# Patient Record
Sex: Female | Born: 1950 | Race: Black or African American | Hispanic: No | Smoking: Never smoker
Health system: Southern US, Community
[De-identification: ages and names within clinical notes are randomized; demographics above are authoritative.]

## PROBLEM LIST (undated history)

## (undated) DIAGNOSIS — N870 Mild cervical dysplasia: Secondary | ICD-10-CM

## (undated) DIAGNOSIS — M8580 Other specified disorders of bone density and structure, unspecified site: Secondary | ICD-10-CM

## (undated) DIAGNOSIS — I471 Supraventricular tachycardia, unspecified: Secondary | ICD-10-CM

## (undated) DIAGNOSIS — M1990 Unspecified osteoarthritis, unspecified site: Secondary | ICD-10-CM

## (undated) DIAGNOSIS — Z973 Presence of spectacles and contact lenses: Secondary | ICD-10-CM

## (undated) DIAGNOSIS — E785 Hyperlipidemia, unspecified: Secondary | ICD-10-CM

## (undated) DIAGNOSIS — K219 Gastro-esophageal reflux disease without esophagitis: Secondary | ICD-10-CM

## (undated) DIAGNOSIS — E063 Autoimmune thyroiditis: Secondary | ICD-10-CM

## (undated) DIAGNOSIS — Z86010 Personal history of colonic polyps: Secondary | ICD-10-CM

## (undated) DIAGNOSIS — Z8669 Personal history of other diseases of the nervous system and sense organs: Secondary | ICD-10-CM

## (undated) DIAGNOSIS — E039 Hypothyroidism, unspecified: Secondary | ICD-10-CM

## (undated) DIAGNOSIS — T8859XA Other complications of anesthesia, initial encounter: Secondary | ICD-10-CM

## (undated) DIAGNOSIS — T4145XA Adverse effect of unspecified anesthetic, initial encounter: Secondary | ICD-10-CM

## (undated) HISTORY — DX: Hypothyroidism, unspecified: E03.9

## (undated) HISTORY — DX: Other specified disorders of bone density and structure, unspecified site: M85.80

## (undated) HISTORY — DX: Mild cervical dysplasia: N87.0

## (undated) HISTORY — PX: COLONOSCOPY: SHX174

## (undated) HISTORY — DX: Supraventricular tachycardia: I47.1

## (undated) HISTORY — DX: Hyperlipidemia, unspecified: E78.5

## (undated) HISTORY — DX: Supraventricular tachycardia, unspecified: I47.10

---
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
## 1974-04-22 HISTORY — PX: BREAST SURGERY: SHX581

## 1999-04-23 HISTORY — PX: CARDIAC ELECTROPHYSIOLOGY STUDY AND ABLATION: SHX1294

## 2007-04-04 ENCOUNTER — Emergency Department (HOSPITAL_COMMUNITY): Admission: EM | Admit: 2007-04-04 | Discharge: 2007-04-04 | Payer: Self-pay | Admitting: *Deleted

## 2007-12-07 DIAGNOSIS — I498 Other specified cardiac arrhythmias: Secondary | ICD-10-CM | POA: Insufficient documentation

## 2008-02-01 DIAGNOSIS — E785 Hyperlipidemia, unspecified: Secondary | ICD-10-CM | POA: Insufficient documentation

## 2008-04-22 DIAGNOSIS — Z860100 Personal history of colon polyps, unspecified: Secondary | ICD-10-CM

## 2008-04-22 HISTORY — DX: Personal history of colon polyps, unspecified: Z86.0100

## 2008-04-22 HISTORY — DX: Personal history of colonic polyps: Z86.010

## 2008-09-26 ENCOUNTER — Emergency Department (HOSPITAL_COMMUNITY): Admission: EM | Admit: 2008-09-26 | Discharge: 2008-09-26 | Payer: Self-pay | Admitting: Emergency Medicine

## 2008-11-16 ENCOUNTER — Encounter: Admission: RE | Admit: 2008-11-16 | Discharge: 2008-11-30 | Payer: Self-pay | Admitting: Sports Medicine

## 2008-11-20 ENCOUNTER — Emergency Department (HOSPITAL_COMMUNITY): Admission: EM | Admit: 2008-11-20 | Discharge: 2008-11-20 | Payer: Self-pay | Admitting: Family Medicine

## 2009-01-07 ENCOUNTER — Ambulatory Visit: Payer: Self-pay | Admitting: Diagnostic Radiology

## 2009-01-07 ENCOUNTER — Encounter: Payer: Self-pay | Admitting: Emergency Medicine

## 2009-01-07 ENCOUNTER — Inpatient Hospital Stay (HOSPITAL_COMMUNITY): Admission: EM | Admit: 2009-01-07 | Discharge: 2009-01-08 | Payer: Self-pay | Admitting: Internal Medicine

## 2009-02-19 ENCOUNTER — Encounter: Admission: RE | Admit: 2009-02-19 | Discharge: 2009-02-19 | Payer: Self-pay | Admitting: Sports Medicine

## 2009-03-10 ENCOUNTER — Encounter: Admission: RE | Admit: 2009-03-10 | Discharge: 2009-03-10 | Payer: Self-pay | Admitting: Sports Medicine

## 2009-03-17 ENCOUNTER — Encounter: Admission: RE | Admit: 2009-03-17 | Discharge: 2009-03-17 | Payer: Self-pay | Admitting: Sports Medicine

## 2009-08-15 ENCOUNTER — Encounter: Admission: RE | Admit: 2009-08-15 | Discharge: 2009-08-15 | Payer: Self-pay | Admitting: Sports Medicine

## 2009-10-02 ENCOUNTER — Encounter: Admission: RE | Admit: 2009-10-02 | Discharge: 2009-10-02 | Payer: Self-pay | Admitting: Family Medicine

## 2009-11-01 ENCOUNTER — Other Ambulatory Visit: Admission: RE | Admit: 2009-11-01 | Discharge: 2009-11-01 | Payer: Self-pay | Admitting: Gynecology

## 2009-11-01 ENCOUNTER — Ambulatory Visit: Payer: Self-pay | Admitting: Women's Health

## 2010-01-17 DIAGNOSIS — E039 Hypothyroidism, unspecified: Secondary | ICD-10-CM | POA: Insufficient documentation

## 2010-02-28 ENCOUNTER — Ambulatory Visit: Payer: Self-pay | Admitting: Women's Health

## 2010-03-10 ENCOUNTER — Emergency Department (HOSPITAL_COMMUNITY): Admission: EM | Admit: 2010-03-10 | Discharge: 2010-03-10 | Payer: Self-pay | Admitting: Emergency Medicine

## 2010-03-19 ENCOUNTER — Ambulatory Visit: Payer: Self-pay | Admitting: Women's Health

## 2010-04-30 ENCOUNTER — Ambulatory Visit
Admission: RE | Admit: 2010-04-30 | Discharge: 2010-04-30 | Payer: Self-pay | Source: Home / Self Care | Attending: Women's Health | Admitting: Women's Health

## 2010-05-13 ENCOUNTER — Encounter: Payer: Self-pay | Admitting: Sports Medicine

## 2010-05-18 ENCOUNTER — Ambulatory Visit
Admission: RE | Admit: 2010-05-18 | Discharge: 2010-05-18 | Payer: Self-pay | Source: Home / Self Care | Attending: Women's Health | Admitting: Women's Health

## 2010-07-03 LAB — CBC
HCT: 36.4 % (ref 36.0–46.0)
Hemoglobin: 13 g/dL (ref 12.0–15.0)
MCH: 29.3 pg (ref 26.0–34.0)
MCHC: 35.7 g/dL (ref 30.0–36.0)
MCV: 82 fL (ref 78.0–100.0)
Platelets: 133 10*3/uL — ABNORMAL LOW (ref 150–400)
RBC: 4.44 MIL/uL (ref 3.87–5.11)
RDW: 14.4 % (ref 11.5–15.5)
WBC: 5 10*3/uL (ref 4.0–10.5)

## 2010-07-03 LAB — DIFFERENTIAL
Basophils Absolute: 0 10*3/uL (ref 0.0–0.1)
Basophils Relative: 0 % (ref 0–1)
Eosinophils Absolute: 0.1 10*3/uL (ref 0.0–0.7)
Eosinophils Relative: 1 % (ref 0–5)
Lymphocytes Relative: 36 % (ref 12–46)
Lymphs Abs: 1.8 10*3/uL (ref 0.7–4.0)
Monocytes Absolute: 0.3 10*3/uL (ref 0.1–1.0)
Monocytes Relative: 7 % (ref 3–12)
Neutro Abs: 2.8 10*3/uL (ref 1.7–7.7)
Neutrophils Relative %: 57 % (ref 43–77)

## 2010-07-03 LAB — BASIC METABOLIC PANEL
BUN: 15 mg/dL (ref 6–23)
CO2: 27 mEq/L (ref 19–32)
Calcium: 9.8 mg/dL (ref 8.4–10.5)
Chloride: 108 mEq/L (ref 96–112)
Creatinine, Ser: 0.74 mg/dL (ref 0.4–1.2)
GFR calc Af Amer: >60 mL/min (ref >60)
GFR calc non Af Amer: >60 mL/min (ref >60)
Glucose, Bld: 92 mg/dL (ref 70–99)
Potassium: 3.6 mEq/L (ref 3.5–5.1)
Sodium: 140 mEq/L (ref 135–145)

## 2010-07-03 LAB — D-DIMER, QUANTITATIVE: D-Dimer, Quant: 0.61 ug/mL-FEU — ABNORMAL HIGH (ref 0.00–0.48)

## 2010-07-27 LAB — CBC
HCT: 39.2 % (ref 36.0–46.0)
Hemoglobin: 13.7 g/dL (ref 12.0–15.0)
MCHC: 35 g/dL (ref 30.0–36.0)
MCV: 87.5 fL (ref 78.0–100.0)
Platelets: 127 K/uL — ABNORMAL LOW (ref 150–400)
RBC: 4.49 MIL/uL (ref 3.87–5.11)
RDW: 14.3 % (ref 11.5–15.5)
WBC: 5 K/uL (ref 4.0–10.5)

## 2010-07-27 LAB — COMPREHENSIVE METABOLIC PANEL
ALT: 24 U/L (ref 0–35)
AST: 29 U/L (ref 0–37)
Albumin: 4.2 g/dL (ref 3.5–5.2)
Alkaline Phosphatase: 84 U/L (ref 39–117)
BUN: 15 mg/dL (ref 6–23)
CO2: 29 mEq/L (ref 19–32)
Calcium: 10.3 mg/dL (ref 8.4–10.5)
Chloride: 106 mEq/L (ref 96–112)
Creatinine, Ser: 0.9 mg/dL (ref 0.4–1.2)
GFR calc Af Amer: >60 mL/min (ref >60)
GFR calc non Af Amer: >60 mL/min (ref >60)
Glucose, Bld: 90 mg/dL (ref 70–99)
Potassium: 3.8 mEq/L (ref 3.5–5.1)
Sodium: 144 mEq/L (ref 135–145)
Total Bilirubin: 0.2 mg/dL — ABNORMAL LOW (ref 0.3–1.2)
Total Protein: 7.3 g/dL (ref 6.0–8.3)

## 2010-07-27 LAB — APTT: aPTT: 29 s (ref 24–37)

## 2010-07-27 LAB — HOMOCYSTEINE: Homocysteine: 6.7 umol/L (ref 4.0–15.4)

## 2010-07-27 LAB — LIPID PANEL
HDL: 68 mg/dL
Total CHOL/HDL Ratio: 2.5 ratio
Triglycerides: 71 mg/dL
VLDL: 14 mg/dL (ref 0–40)

## 2010-07-27 LAB — POCT CARDIAC MARKERS
CKMB, poc: 1.2 ng/mL (ref 1.0–8.0)
Myoglobin, poc: 47.9 ng/mL (ref 12–200)
Troponin i, poc: <0.05 ng/mL (ref 0.00–0.09)

## 2010-07-27 LAB — DIFFERENTIAL
Basophils Absolute: 0 10*3/uL (ref 0.0–0.1)
Basophils Relative: 0 % (ref 0–1)
Eosinophils Absolute: 0.1 10*3/uL (ref 0.0–0.7)
Eosinophils Relative: 1 % (ref 0–5)
Lymphocytes Relative: 42 % (ref 12–46)
Lymphs Abs: 2.1 10*3/uL (ref 0.7–4.0)
Monocytes Absolute: 0.4 10*3/uL (ref 0.1–1.0)
Monocytes Relative: 8 % (ref 3–12)
Neutro Abs: 2.4 10*3/uL (ref 1.7–7.7)
Neutrophils Relative %: 48 % (ref 43–77)

## 2010-07-27 LAB — T4, FREE: Free T4: 0.85 ng/dL (ref 0.80–1.80)

## 2010-07-27 LAB — GLUCOSE, CAPILLARY: Glucose-Capillary: 98 mg/dL (ref 70–99)

## 2010-07-27 LAB — PROTIME-INR
INR: 0.9 (ref 0.00–1.49)
Prothrombin Time: 12.2 s (ref 11.6–15.2)

## 2010-07-27 LAB — TSH: TSH: 0.183 u[IU]/mL — ABNORMAL LOW (ref 0.350–4.500)

## 2010-07-27 LAB — SEDIMENTATION RATE: Sed Rate: 1 mm/hr (ref 0–22)

## 2010-08-03 ENCOUNTER — Inpatient Hospital Stay (INDEPENDENT_AMBULATORY_CARE_PROVIDER_SITE_OTHER)
Admission: RE | Admit: 2010-08-03 | Discharge: 2010-08-03 | Disposition: A | Discharge status: Home / Self Care | Payer: BC Managed Care – PPO | Source: Ambulatory Visit | Attending: Emergency Medicine | Admitting: Emergency Medicine

## 2010-08-03 DIAGNOSIS — N390 Urinary tract infection, site not specified: Secondary | ICD-10-CM

## 2010-08-03 LAB — POCT URINALYSIS DIP (DEVICE)
Bilirubin Urine: NEGATIVE
Glucose, UA: NEGATIVE mg/dL
Ketones, ur: NEGATIVE mg/dL
Nitrite: NEGATIVE
Protein, ur: NEGATIVE mg/dL
Specific Gravity, Urine: >=1.03 (ref 1.005–1.030)
Urobilinogen, UA: 0.2 mg/dL (ref 0.0–1.0)
pH: 5.5 (ref 5.0–8.0)

## 2010-08-05 LAB — URINE CULTURE
Colony Count: >=100000
Culture  Setup Time: 201204132152

## 2010-11-20 ENCOUNTER — Telehealth: Payer: Self-pay | Admitting: Women's Health

## 2010-11-20 NOTE — Telephone Encounter (Signed)
Telephone call to patient in regards to transvaginal pelvic ultrasound. Ultrasound was done at  [facility]. Reviewed with patient fibroid uterus, which was known. She is postmenopausal x8 years with no bleeding. Endometrial thickness measures 8 mm. Will schedule an endometrial biopsy with Dr. Lily Peer here. Switch to scheduling.

## 2010-11-27 ENCOUNTER — Encounter: Payer: Self-pay | Admitting: Gynecology

## 2010-11-27 ENCOUNTER — Ambulatory Visit (INDEPENDENT_AMBULATORY_CARE_PROVIDER_SITE_OTHER): Payer: BC Managed Care – PPO | Admitting: Gynecology

## 2010-11-27 VITALS — BP 126/82

## 2010-11-27 DIAGNOSIS — R9389 Abnormal findings on diagnostic imaging of other specified body structures: Secondary | ICD-10-CM

## 2010-11-27 DIAGNOSIS — D259 Leiomyoma of uterus, unspecified: Secondary | ICD-10-CM | POA: Insufficient documentation

## 2010-11-27 NOTE — Patient Instructions (Signed)
Please schedule underwent out an appointment for a sonohysterogram next week. Anticipate some spotting and brownish discharge as a result of the endometrial biopsy we did today. Will notify you assisted pathology report becomes available. Taken ibuprofen before coming to the office with a sonohysterogram next week.

## 2010-11-27 NOTE — Progress Notes (Signed)
Addended by: Bertram Savin A on: 11/27/2010 10:25 AM   Modules accepted: Orders

## 2010-11-27 NOTE — Progress Notes (Signed)
Patient is a 60 year old gravida 1 para 1 who presented to the office today for an endometrial biopsy. [facility] to the [facility] who have been following her for hypothyroidism hyperlipidemia and SVT and had mentioned that she was having right-sided pelvic pain and history of fibroids and subsequently was sent for an ultrasound which was done at their facility. The ultrasound report demonstrated multiple uterine fibroids measuring up to 4.8 cm at least one which had a central submucosal component. The recorded endometrial thickness measured 8 mm. She denied any postmenopausal bleeding and presented for an endometrial biopsy today.  Pelvic exam: Bartholin urethra Skene glands: Within normal limits Vagina moist no gross lesions Cervix: No lesions or discharge Uterus upper limits of normal slightly irregular and approximately 10 week size Rectal exam deferred Patient was counseled for an endometrial biopsy the risks benefits and pros and cons were discussed the cervix was cleansed as a betadine solution. A single-tooth tenaculum was placed on the anterior cervical lip the small rubber dilator was utilized to help dilate it slightly stenotic cervical os. A Pipelle was introduced into the intrauterine cavity for approximately 6-1/2-7 cm and very little tissue was obtained which was submitted for histological evaluation. The single-tooth tenaculum was removed silver nitrate was used for hemostasis. Patient will return back next week to discuss the results of endometrial biopsy and to proceed with a sonohysterogram to better assess her intrauterine cavity. All questions were answered and we'll follow accordingly.

## 2010-11-28 ENCOUNTER — Other Ambulatory Visit: Payer: Self-pay

## 2010-11-28 DIAGNOSIS — D259 Leiomyoma of uterus, unspecified: Secondary | ICD-10-CM

## 2010-11-28 NOTE — Progress Notes (Signed)
Addended by: Ladona Horns E on: 11/28/2010 03:54 PM   Modules accepted: Orders

## 2010-12-04 ENCOUNTER — Other Ambulatory Visit: Payer: BC Managed Care – PPO

## 2010-12-04 ENCOUNTER — Ambulatory Visit (INDEPENDENT_AMBULATORY_CARE_PROVIDER_SITE_OTHER): Payer: BC Managed Care – PPO | Admitting: Gynecology

## 2010-12-04 DIAGNOSIS — D219 Benign neoplasm of connective and other soft tissue, unspecified: Secondary | ICD-10-CM

## 2010-12-04 DIAGNOSIS — N840 Polyp of corpus uteri: Secondary | ICD-10-CM | POA: Insufficient documentation

## 2010-12-04 DIAGNOSIS — D259 Leiomyoma of uterus, unspecified: Secondary | ICD-10-CM

## 2010-12-04 NOTE — Progress Notes (Signed)
Patient is a 61 year old gravida 1 para 1 who presented to the office last week  for an endometrial biopsy. She presented to the [facility] who had been following her for hypothyroidism hyperlipidemia and SVT and had mentioned that she was having right-sided pelvic pain and history of fibroids and subsequently was sent for an ultrasound which was done at their facility. The ultrasound report demonstrated multiple uterine fibroids measuring up to 4.8 cm at least one which had a central submucosal component. The recorded endometrial thickness measured 8 mm. She denied any postmenopausal bleeding and presented for an endometrial biopsy.   Patient presented to the office today to discuss the results of the endometrial biopsy as well as to proceed with a sonohysterogram as far for evaluation. Endometrial biopsy did not demonstrate any endometrial cells present and benign endocervical cells were evident and. The sonohysterogram today demonstrated a small endometrial polyp measured 11 x 6 mm on the posterior uterine wall.  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

## 2010-12-05 ENCOUNTER — Telehealth: Payer: Self-pay

## 2010-12-05 NOTE — Telephone Encounter (Signed)
I called patient to set up surgery per Dr. Glenetta Hew request.  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  We scheduled her for Friday August 31 at 1:00pm at [facility].  She was instructed to come in to [facility] earlier that week for preop labwork and these details are on the pamphlet I mailed to her.  She has my direct line and will call me if she has any questions.

## 2010-12-06 ENCOUNTER — Telehealth: Payer: Self-pay

## 2010-12-06 ENCOUNTER — Other Ambulatory Visit: Payer: Self-pay | Admitting: Gynecology

## 2010-12-06 DIAGNOSIS — Z01818 Encounter for other preprocedural examination: Secondary | ICD-10-CM

## 2010-12-06 NOTE — Telephone Encounter (Signed)
Pt called to inform me and Dr. Glenetta Hew that she has appt with Dr. Orson Aloe at [facility] [address] August 22.  Fax # is [phone] attn: Dr. Orson Aloe.

## 2010-12-06 NOTE — Telephone Encounter (Signed)
I called patient to let her know that Dr. Glenetta Hew wants her to get medical clearance in regards to her SVT.  She seemed a bit surprised about this and I explained that he would be ordering general anesthesia and just wanted to be sure MD who followed SVT felt okay with her having GETA.  She said that her cardiologist had retired and they had referred her to a Dr. Orson Aloe [facility]) and she had not yet seen him.  She said she understood it was not easy to get in with him.  I offered to send a letter to him before her call explaining her surgery and need for clearance. She said she would have to check with her primary care doctor and get his phone number and she will call me back with that information.

## 2010-12-06 NOTE — Progress Notes (Signed)
12-06-10 I faxed letter to cardiologist, Dr. Orson Aloe at [facility] who patient has appt with on Aug 22. Copy of letter is in echart. ka

## 2010-12-13 NOTE — Telephone Encounter (Signed)
Error/no call/wrong patient

## 2010-12-17 ENCOUNTER — Encounter: Payer: Self-pay | Admitting: Gynecology

## 2010-12-17 NOTE — Progress Notes (Signed)
Received letter from Dr. Amanda Cockayne at [facility]. Saying he considered patient to be low risk for general anesthesia for her surgery.  Dr. Glenetta Hew reviewed the letter and records and asked me to fax to [facility] and to ask them if they might need to see her prior to Friday.  I faxed it to [facility], preadmitg nurse, and left her a detailed voice mail asking her to address the matter of if patient needed to be seen prior to surgery date. ka

## 2010-12-19 ENCOUNTER — Ambulatory Visit (INDEPENDENT_AMBULATORY_CARE_PROVIDER_SITE_OTHER): Payer: BC Managed Care – PPO | Admitting: *Deleted

## 2010-12-19 DIAGNOSIS — R823 Hemoglobinuria: Secondary | ICD-10-CM

## 2010-12-19 DIAGNOSIS — Z01818 Encounter for other preprocedural examination: Secondary | ICD-10-CM

## 2010-12-20 LAB — COMPREHENSIVE METABOLIC PANEL
ALT: 22 U/L (ref 0–35)
AST: 29 U/L (ref 0–37)
Albumin: 5 g/dL (ref 3.5–5.2)
Alkaline Phosphatase: 84 U/L (ref 39–117)
BUN: 17 mg/dL (ref 6–23)
CO2: 27 mEq/L (ref 19–32)
Calcium: 10.2 mg/dL (ref 8.4–10.5)
Chloride: 111 mEq/L (ref 96–112)
Creat: 0.93 mg/dL (ref 0.50–1.10)
Glucose, Bld: 95 mg/dL (ref 70–99)
Potassium: 4 mEq/L (ref 3.5–5.3)
Sodium: 146 mEq/L — ABNORMAL HIGH (ref 135–145)
Total Bilirubin: 0.7 mg/dL (ref 0.3–1.2)
Total Protein: 7.1 g/dL (ref 6.0–8.3)

## 2010-12-21 ENCOUNTER — Encounter: Payer: Self-pay | Admitting: Gynecology

## 2010-12-21 ENCOUNTER — Telehealth: Payer: Self-pay

## 2010-12-21 NOTE — Progress Notes (Signed)
I got a call this morning first thing from [place] at [facility]. She said patient had called at 1am on her voice message and left msg that she needed to cancel her surgery at 1:00pm to day due to family emergency.  She asked about resched to next Friday.  I called patient and asked her to call me to confirm this and told her if I had not heard from her within the hour that I would go ahead and c/r.  She never called [facility].  I rescheduled her surgery for Sept 14 at 7:30am at [facility] and called and told her so and asked her to call me to confirm.  I also mailed letter and pamphlet from [facility] including dates and times. Per Dr. Glenetta Hew patient will not need to repeat her labwork. ka

## 2010-12-21 NOTE — Telephone Encounter (Signed)
I left patient a message letting her know that i had gotten her message to cancel surgery today through [place] at the [facility].  I have rescheduled her surgery for Friday, Sept 14 at 7:30am and I asked her to call and confirm that.

## 2011-01-04 ENCOUNTER — Emergency Department (HOSPITAL_BASED_OUTPATIENT_CLINIC_OR_DEPARTMENT_OTHER)
Admission: EM | Admit: 2011-01-04 | Discharge: 2011-01-04 | Disposition: A | Discharge status: Home / Self Care | Payer: BC Managed Care – PPO | Attending: Emergency Medicine | Admitting: Emergency Medicine

## 2011-01-04 ENCOUNTER — Other Ambulatory Visit: Payer: Self-pay | Admitting: Gynecology

## 2011-01-04 ENCOUNTER — Encounter (HOSPITAL_BASED_OUTPATIENT_CLINIC_OR_DEPARTMENT_OTHER): Payer: Self-pay | Admitting: *Deleted

## 2011-01-04 ENCOUNTER — Ambulatory Visit (HOSPITAL_BASED_OUTPATIENT_CLINIC_OR_DEPARTMENT_OTHER)
Admission: RE | Admit: 2011-01-04 | Discharge: 2011-01-04 | Disposition: A | Discharge status: Home / Self Care | Payer: BC Managed Care – PPO | Source: Ambulatory Visit | Attending: Gynecology | Admitting: Gynecology

## 2011-01-04 ENCOUNTER — Emergency Department (INDEPENDENT_AMBULATORY_CARE_PROVIDER_SITE_OTHER): Payer: BC Managed Care – PPO

## 2011-01-04 DIAGNOSIS — E785 Hyperlipidemia, unspecified: Secondary | ICD-10-CM | POA: Insufficient documentation

## 2011-01-04 DIAGNOSIS — D259 Leiomyoma of uterus, unspecified: Secondary | ICD-10-CM | POA: Insufficient documentation

## 2011-01-04 DIAGNOSIS — N840 Polyp of corpus uteri: Secondary | ICD-10-CM | POA: Insufficient documentation

## 2011-01-04 DIAGNOSIS — Z79899 Other long term (current) drug therapy: Secondary | ICD-10-CM | POA: Insufficient documentation

## 2011-01-04 DIAGNOSIS — D571 Sickle-cell disease without crisis: Secondary | ICD-10-CM | POA: Insufficient documentation

## 2011-01-04 DIAGNOSIS — E079 Disorder of thyroid, unspecified: Secondary | ICD-10-CM | POA: Insufficient documentation

## 2011-01-04 DIAGNOSIS — R110 Nausea: Secondary | ICD-10-CM

## 2011-01-04 DIAGNOSIS — G8918 Other acute postprocedural pain: Secondary | ICD-10-CM | POA: Insufficient documentation

## 2011-01-04 DIAGNOSIS — D250 Submucous leiomyoma of uterus: Secondary | ICD-10-CM

## 2011-01-04 DIAGNOSIS — R109 Unspecified abdominal pain: Secondary | ICD-10-CM | POA: Insufficient documentation

## 2011-01-04 DIAGNOSIS — Z01810 Encounter for preprocedural cardiovascular examination: Secondary | ICD-10-CM | POA: Insufficient documentation

## 2011-01-04 DIAGNOSIS — E039 Hypothyroidism, unspecified: Secondary | ICD-10-CM | POA: Insufficient documentation

## 2011-01-04 LAB — CBC
HCT: 34.7 % — ABNORMAL LOW (ref 36.0–46.0)
Hemoglobin: 12.6 g/dL (ref 12.0–15.0)
MCH: 28.8 pg (ref 26.0–34.0)
MCHC: 36.3 g/dL — ABNORMAL HIGH (ref 30.0–36.0)
MCV: 79.2 fL (ref 78.0–100.0)
Platelets: 139 10*3/uL — ABNORMAL LOW (ref 150–400)
RBC: 4.38 MIL/uL (ref 3.87–5.11)
RDW: 14.7 % (ref 11.5–15.5)
WBC: 6.5 10*3/uL (ref 4.0–10.5)

## 2011-01-04 LAB — BASIC METABOLIC PANEL
BUN: 15 mg/dL (ref 6–23)
CO2: 25 mEq/L (ref 19–32)
Calcium: 10.4 mg/dL (ref 8.4–10.5)
Chloride: 107 mEq/L (ref 96–112)
Creatinine, Ser: 0.6 mg/dL (ref 0.50–1.10)
GFR calc Af Amer: >60 mL/min (ref >60)
GFR calc non Af Amer: >60 mL/min (ref >60)
Glucose, Bld: 122 mg/dL — ABNORMAL HIGH (ref 70–99)
Potassium: 3.6 mEq/L (ref 3.5–5.1)
Sodium: 141 mEq/L (ref 135–145)

## 2011-01-04 MED ORDER — ONDANSETRON HCL 4 MG/2ML IJ SOLN
4.0000 mg | Freq: Once | INTRAMUSCULAR | Status: AC
  Administered 2011-01-04: 4 mg via INTRAVENOUS
  Filled 2011-01-04: qty 2

## 2011-01-04 MED ORDER — HYDROCODONE-ACETAMINOPHEN 5-325 MG PO TABS: 2.0000 | ORAL_TABLET | ORAL | Status: AC | PRN

## 2011-01-04 MED ORDER — HYDROMORPHONE HCL 1 MG/ML IJ SOLN
0.5000 mg | Freq: Once | INTRAMUSCULAR | Status: AC
  Administered 2011-01-04: 0.5 mg via INTRAVENOUS
  Filled 2011-01-04: qty 1

## 2011-01-04 MED ORDER — SODIUM CHLORIDE 0.9 % IV SOLN
Freq: Once | INTRAVENOUS | Status: AC
  Administered 2011-01-04: 21:00:00 via INTRAVENOUS

## 2011-01-04 MED ORDER — IOHEXOL 300 MG/ML  SOLN
100.0000 mL | Freq: Once | INTRAMUSCULAR | Status: AC | PRN
  Administered 2011-01-04: 100 mL via INTRAVENOUS

## 2011-01-04 NOTE — ED Notes (Signed)
Polyp removed from uterus this am at [facility] ob/gyn she had general anesthesia was doing well about 45 minutes ago started having a pressure in lower abdomen cbg 124 .. 148/98 hr 100 rr 20 nausea but no emesis. Pt doesn't describe as pain but as pressure abdomen is soft and nontender

## 2011-01-04 NOTE — ED Provider Notes (Signed)
Medical screening examination/treatment/procedure(s) were performed by non-physician practitioner and as supervising physician I was immediately available for consultation/collaboration.   Dayton Bailiff, MD 01/04/11 [phone]

## 2011-01-04 NOTE — ED Provider Notes (Signed)
History     CSN: [id number] Arrival date & time: 01/04/2011  7:04 PM   Chief Complaint  Patient presents with  . Abdominal Pain     (Include location/radiation/quality/duration/timing/severity/associated sxs/prior treatment) Patient is a 60 y.o. female presenting with abdominal pain. The history is provided by the patient.  Abdominal Pain The primary symptoms of the illness include abdominal pain. The current episode started 13 to 24 hours ago. The onset of the illness was gradual. The problem has been gradually improving.  Associated with: surgey today. The patient states that she believes she is currently not pregnant. The patient has not had a change in bowel habit. Risk factors for an acute abdominal problem include a history of abdominal surgery. Additional symptoms associated with the illness include frequency. Symptoms associated with the illness do not include chills, anorexia, constipation or urgency. 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 Significant associated medical issues do not include GERD or diabetes.  Pt had an endometrial polyp removed today.  Pt reports surgery was done at [facility].  Pt reports she began having pain tonight.  Pt reports surgery was at [facility] by Dr. Lily Peer.   Past Medical History  Diagnosis Date  . Hypothyroidism   . Hyperlipidemia   . SVT (supraventricular tachycardia)      Past Surgical History  Procedure Date  . Cesarean section 1987  . Breast surgery 1976    CYST REMOVED    Family History  Problem Relation Age of Onset  . Hypertension Mother   . Heart disease Mother   . Hypertension Father   . Heart disease Father   . Hypertension Brother     History  Substance Use Topics  . Smoking status: Never Smoker   . Smokeless tobacco: Never Used  . Alcohol Use: Yes    OB History    Grav Para Term Preterm Abortions TAB SAB Ect Mult Living                  Review of Systems  Constitutional:  Negative for chills.  Gastrointestinal: Positive for abdominal pain. Negative for constipation and anorexia.  Genitourinary: Positive for frequency. Negative for urgency.  All other systems reviewed and are negative.    Allergies  Lidocaine-epinephrine  Home Medications   Current Outpatient Rx  Name Route Sig Dispense Refill  . ATORVASTATIN CALCIUM 10 MG PO TABS Oral Take 5 mg by mouth daily.     [name] CALCIUM CARBONATE 1250 MG PO TABS Oral Take 1 tablet by mouth daily.      [name] CARBOXYMETHYLCELLULOSE SODIUM 1 % OP SOLN Both Eyes Place 1 drop into both eyes 2 (two) times daily as needed. irritation     . VITAMIN D3 3000 UNITS PO TABS Oral Take 1 tablet by mouth daily.      . IBUPROFEN 200 MG PO TABS Oral Take 200 mg by mouth every 6 (six) hours as needed. pain     . LEVOTHYROXINE SODIUM 75 MCG PO TABS Oral Take 75 mcg by mouth daily.      [name] METOPROLOL SUCCINATE 25 MG PO TB24 Oral Take 12.5 mg by mouth at bedtime.      [name] ONE-DAILY MULTI VITAMINS PO TABS Oral Take 1 tablet by mouth daily.        Physical Exam    BP 102/52  Pulse 58  Temp(Src) 98 F (36.7 C) (Oral)  Resp 16  Ht 5\' 3"  (1.6 m)  Wt 158 lb (71.668 kg)  BMI 27.99 kg/m2  SpO2 96%  LMP 11/26/2000  Physical Exam  Nursing note and vitals reviewed. Constitutional: She is oriented to person, place, and time. She appears well-developed and well-nourished.  HENT:  Head: Normocephalic and atraumatic.  Eyes: Conjunctivae and EOM are normal. Pupils are equal, round, and reactive to light.  Neck: Normal range of motion.  Cardiovascular: Normal rate.   Pulmonary/Chest: Effort normal.  Abdominal: Soft. There is tenderness.  Musculoskeletal: Normal range of motion.  Neurological: She is alert and oriented to person, place, and time. She has normal reflexes.  Skin: Skin is warm.  Psychiatric: She has a normal mood and affect.    ED Course  Procedures  Results for orders placed during the hospital encounter of 01/04/11  CBC       Component Value Range   WBC 6.5  4.0 - 10.5 (K/uL)   RBC 4.38  3.87 - 5.11 (MIL/uL)   Hemoglobin 12.6  12.0 - 15.0 (g/dL)   HCT 04.5 (*) 40.9 - 46.0 (%)   MCV 79.2  78.0 - 100.0 (fL)   MCH 28.8  26.0 - 34.0 (pg)   MCHC 36.3 (*) 30.0 - 36.0 (g/dL)   RDW 81.1  91.4 - 78.2 (%)   Platelets 139 (*) 150 - 400 (K/uL)  BASIC METABOLIC PANEL      Component Value Range   Sodium 141  135 - 145 (mEq/L)   Potassium 3.6  3.5 - 5.1 (mEq/L)   Chloride 107  96 - 112 (mEq/L)   CO2 25  19 - 32 (mEq/L)   Glucose, Bld 122 (*) 70 - 99 (mg/dL)   BUN 15  6 - 23 (mg/dL)   Creatinine, Ser 9.56  0.50 - 1.10 (mg/dL)   Calcium 21.3  8.4 - 10.5 (mg/dL)   GFR calc non Af Amer >60  >60 (mL/min)   GFR calc Af Amer >60  >60 (mL/min)   No results found.   No diagnosis found.   MDM  I spoke to dr. Lily Peer.  He advised if cbc and Ct are normal.  Pt can follow up as scheduled.  Pt given Iv fluids and pain medication here.   Results for orders placed during the hospital encounter of 01/04/11  CBC      Component Value Range   WBC 6.5  4.0 - 10.5 (K/uL)   RBC 4.38  3.87 - 5.11 (MIL/uL)   Hemoglobin 12.6  12.0 - 15.0 (g/dL)   HCT 08.6 (*) 57.8 - 46.0 (%)   MCV 79.2  78.0 - 100.0 (fL)   MCH 28.8  26.0 - 34.0 (pg)   MCHC 36.3 (*) 30.0 - 36.0 (g/dL)   RDW 46.9  62.9 - 52.8 (%)   Platelets 139 (*) 150 - 400 (K/uL)  BASIC METABOLIC PANEL      Component Value Range   Sodium 141  135 - 145 (mEq/L)   Potassium 3.6  3.5 - 5.1 (mEq/L)   Chloride 107  96 - 112 (mEq/L)   CO2 25  19 - 32 (mEq/L)   Glucose, Bld 122 (*) 70 - 99 (mg/dL)   BUN 15  6 - 23 (mg/dL)   Creatinine, Ser 4.13  0.50 - 1.10 (mg/dL)   Calcium 24.4  8.4 - 10.5 (mg/dL)   GFR calc non Af Amer >60  >60 (mL/min)   GFR calc Af Amer >60  >60 (mL/min)   Ct Abdomen Pelvis W Contrast  01/04/2011  *RADIOLOGY REPORT*  Clinical Data: Lower abdominal pressure  and nausea.  Recent general anesthesia, for endometrial polyp resection.  CT ABDOMEN AND  PELVIS WITH CONTRAST  Technique:  Multidetector CT imaging of the abdomen and pelvis was performed following the standard protocol during bolus administration of intravenous contrast.  Contrast: OMNIPAQUE IOHEXOL 300 MG/ML IV SOLN  Comparison: CT of the abdomen and pelvis performed 04/04/2007  Findings: The visualized lung bases are clear.  The liver and spleen are unremarkable in appearance.  The gallbladder is within normal limits.  The pancreas and adrenal glands are unremarkable.  The kidneys are within normal limits bilaterally; no hydronephrosis or perinephric stranding is seen.  No free fluid is identified.  The small bowel is unremarkable in appearance.  The stomach is within normal limits.  No acute vascular abnormalities are seen. Minimal calcification is noted along the left common iliac artery.  The appendix appears diminutive and is grossly unremarkable in appearance, without evidence for appendicitis.  The colon is partially filled with stool and air and is within normal limits.  The bladder is moderately distended and is grossly unremarkable in appearance.  Multiple fibroids are noted within the uterus, with mild abnormal enlargement of the uterus.  The ovaries appear symmetric; no suspicious adnexal masses are seen.  No inguinal lymphadenopathy is seen.  No acute osseous abnormalities are identified.  There is disc space narrowing at L5-S1, with associated vacuum phenomenon.  IMPRESSION:  1.  No acute abnormalities identified within the abdomen or pelvis. 2.  Mildly enlarged fibroid uterus noted. 3.  Mild degenerative change at the lower lumbar spine.  Original Report Authenticated By: Tonia Ghent, M.D.          Langston Masker, [place] 01/04/11 2250  Langston Masker, [place] 01/04/11 2253

## 2011-01-07 ENCOUNTER — Encounter: Payer: Self-pay | Admitting: Gynecology

## 2011-01-07 ENCOUNTER — Telehealth: Payer: Self-pay | Admitting: *Deleted

## 2011-01-07 NOTE — Telephone Encounter (Signed)
Message copied by Aura Camps on Mon Jan 07, 2011  2:27 PM ------      Message from: Ok Edwards      Created: Mon Jan 07, 2011  1:59 PM       Victorino Dike, please inform patient that the pathology report from her surgery last week was normal.

## 2011-01-07 NOTE — Telephone Encounter (Signed)
PT INFORMED WITH THE BELOW NOTE. 

## 2011-01-11 NOTE — Op Note (Signed)
  [name], [name]      ACCOUNT NO.:  [id number]  MEDICAL RECORD NO.:  [id number]  LOCATION:                                 FACILITY:  PHYSICIAN:  Leimomi Zervas H. Lily Peer, M.D.DATE OF BIRTH:  Jul 15, 1950  DATE OF PROCEDURE:  01/04/2011 DATE OF DISCHARGE:                              OPERATIVE REPORT   SURGEON:  Kyana Aicher H.  Lily Peer, MD  INDICATION FOR OPERATION:  A 60 year old gravida 1, para 1 with endometrial polyps.  PREOPERATIVE DIAGNOSIS:  Endometrial polyps.  POSTOPERATIVE DIAGNOSES: 1. Endometrial polyps. 2. Submucous myoma.  ANESTHESIA:  General endotracheal anesthesia.  PROCEDURE PERFORMED:  Resectoscopic polypectomy/myomectomy with MyoSure hysteroscopic morcellator.  FINDINGS:  The patient had a small lower uterine segment anterior uterine wall endometrial polyp and a right submucous myoma, posterior uterine wall near tubal ostia.  DESCRIPTION OF OPERATION:  After the patient was adequately counseled, she was taken to the operating room where she underwent a successful general endotracheal anesthesia.  The patient had received a gram of Ancef for prophylaxis.  After the vagina was prepped and draped in usual sterile fashion, a red rubber Roxan Hockey was inserted to evacuate the bladder of its contents for approximately 50 cc.  Bimanual examination demonstrated anteverted uterus.  No palpable adnexal masses.  A single- tooth tenaculum was placed on the anterior cervical lip.  The uterus sounded to 8.5 cm and the cervical canal was dilated with a Pratt dilator to a size 21.  The operative resectoscope with the MyoSure morcellator attach was introduced into the intrauterine cavity.  Normal saline was the distending media.  Inspection with the above-mentioned findings.  With the morcellator, the lower uterine segment anterior uterine wall polyp was resected and then attention was placed to the submucous myoma to the left uterine posterior wall, which was  resected with the morcellator.  The patient tolerated the procedure well.  Fluid deficit was less than 100 cc and blood loss was minimal and fluid resuscitation consisted of 1100 cc of lactated Ringer's.  The patient was extubated after the single-toothed tenaculum was removed, was awakened, transferred to recovery with stable vital signs.     Aayan Haskew H. Lily Peer, M.D.     JHF/MEDQ  D:  01/04/2011  T:  01/04/2011  Job:  [id number]  Electronically Signed by Reynaldo Minium M.D. on 01/11/2011 07:50:10 AM

## 2011-01-17 ENCOUNTER — Ambulatory Visit: Payer: BC Managed Care – PPO | Admitting: Gynecology

## 2011-01-21 ENCOUNTER — Ambulatory Visit (INDEPENDENT_AMBULATORY_CARE_PROVIDER_SITE_OTHER): Payer: BC Managed Care – PPO | Admitting: Gynecology

## 2011-01-21 ENCOUNTER — Encounter: Payer: Self-pay | Admitting: Gynecology

## 2011-01-21 VITALS — BP 128/78

## 2011-01-21 DIAGNOSIS — Z9889 Other specified postprocedural states: Secondary | ICD-10-CM

## 2011-01-21 NOTE — Patient Instructions (Signed)
Annual exam with Harriett Sine next month

## 2011-01-21 NOTE — Progress Notes (Signed)
Patient's a 60 year old gravida 1 para 1 who on September 14 underwent a resectoscopic polypectomy using the Myosure hysteroscopic uterine morcellator. Procedure had been undertaken on 01/04/2011. Pathology report:  Benign endometrial polyp and an adjacent benign weakly proliferative endometrium, no atypia, hyperplasia or malignancy. Underlying unremarkable myometrium.  Pelvic exam: Bartholin urethra Skene glands: Unremarkable Vagina: Friable atrophic no gross lesions Cervix: No gross lesions or discharge Bimanual exam: Slightly irregular size uterus 6 weeks size no palpable adnexal masses Rectal exam: Not done  Assessment: 2 weeks postop status post resectoscopic polypectomy with benign pathology patient doing well. Will be scheduled to return back next month for her annual exam. Patient with history of small fibroids in her uterus. We'll recommend followup with ultrasound once a year. Plan: As per assessment above.

## 2011-01-28 LAB — HEPATIC FUNCTION PANEL
ALT: 19
AST: 24
Albumin: 4.2
Alkaline Phosphatase: 81
Bilirubin, Direct: 0.2
Indirect Bilirubin: 0.6
Total Bilirubin: 0.8
Total Protein: 7.6

## 2011-01-28 LAB — BASIC METABOLIC PANEL
BUN: 18
CO2: 28
Calcium: 10.3
Chloride: 107
Creatinine, Ser: 0.78
GFR calc Af Amer: >60
GFR calc non Af Amer: >60
Glucose, Bld: 86
Potassium: 4
Sodium: 140

## 2011-01-28 LAB — CBC
HCT: 42.2
Hemoglobin: 14.5
MCHC: 34.5
MCV: 86.2
Platelets: 152
RBC: 4.89
RDW: 14.7
WBC: 3.9 — ABNORMAL LOW

## 2011-01-28 LAB — URINALYSIS, ROUTINE W REFLEX MICROSCOPIC
Bilirubin Urine: NEGATIVE
Glucose, UA: NEGATIVE
Ketones, ur: NEGATIVE
Leukocytes, UA: NEGATIVE
Nitrite: NEGATIVE
Protein, ur: NEGATIVE
Specific Gravity, Urine: 1.008
Urobilinogen, UA: 0.2
pH: 5.5

## 2011-01-28 LAB — DIFFERENTIAL
Basophils Absolute: 0
Basophils Relative: 1
Eosinophils Absolute: 0 — ABNORMAL LOW
Eosinophils Relative: 1
Lymphocytes Relative: 42
Lymphs Abs: 1.6
Monocytes Absolute: 0.3
Monocytes Relative: 7
Neutro Abs: 1.9
Neutrophils Relative %: 50

## 2011-01-28 LAB — URINE MICROSCOPIC-ADD ON

## 2011-02-22 ENCOUNTER — Encounter: Payer: Self-pay | Admitting: Gynecology

## 2011-02-27 ENCOUNTER — Encounter: Payer: Self-pay | Admitting: Women's Health

## 2011-02-27 ENCOUNTER — Ambulatory Visit (INDEPENDENT_AMBULATORY_CARE_PROVIDER_SITE_OTHER): Payer: BC Managed Care – PPO | Admitting: Women's Health

## 2011-02-27 ENCOUNTER — Other Ambulatory Visit (HOSPITAL_COMMUNITY)
Admission: RE | Admit: 2011-02-27 | Discharge: 2011-02-27 | Disposition: A | Discharge status: Home / Self Care | Payer: BC Managed Care – PPO | Source: Ambulatory Visit | Attending: Gynecology | Admitting: Gynecology

## 2011-02-27 VITALS — BP 124/70 | Ht 62.25 in | Wt 158.0 lb

## 2011-02-27 DIAGNOSIS — Z01419 Encounter for gynecological examination (general) (routine) without abnormal findings: Secondary | ICD-10-CM

## 2011-02-27 DIAGNOSIS — Z780 Asymptomatic menopausal state: Secondary | ICD-10-CM

## 2011-02-27 NOTE — Progress Notes (Signed)
[name] [name] 06-Sep-1950 [id number]    History:    The patient presents for annual exam.  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   Past medical history, past surgical history, family history and social history were all reviewed and documented in the EPIC chart.   ROS:  A  ROS was performed and pertinent positives and negatives are included in the history.  Exam:  Filed Vitals:   02/27/11 0921  BP: 124/70    General appearance:  Normal Head/Neck:  Normal, without cervical or supraclavicular adenopathy. Thyroid:  Symmetrical, normal in size, without palpable masses or nodularity. Respiratory  Effort:  Normal  Auscultation:  Clear without wheezing or rhonchi Cardiovascular  Auscultation:  Regular rate, without rubs, murmurs or gallops  Edema/varicosities:  Not grossly evident Abdominal  Soft,nontender, without masses, guarding or rebound.  Liver/spleen:  No organomegaly noted  Hernia:  None appreciated  Skin  Inspection:  Grossly normal  Palpation:  Grossly normal Neurologic/psychiatric  Orientation:  Normal with appropriate conversation.  Mood/affect:  Normal  Genitourinary    Breasts: Examined lying and sitting.     Right: Without masses, retractions, discharge or axillary adenopathy.     Left: Without masses, retractions, discharge or axillary adenopathy.   Inguinal/mons:  Normal without inguinal adenopathy  External genitalia:  Normal  BUS/Urethra/Skene's glands:  Normal  Bladder:  Normal  Vagina:  Atrophic  Cervix:  Normal  Uterus:   normal in size, shape and contour.  Midline and mobile  Adnexa/parametria:     Rt: Without masses or tenderness.   Lt: Without masses or tenderness.  Anus and perineum: Normal  Digital rectal exam: Normal sphincter tone without palpated masses or tenderness  Assessment/Plan:  60 y.o. WBF G1P1 for annual exam postmenopausal with no bleeding on no HRT. Only complaint vaginal dryness. History of benign endometrial polyps  in September  2012. Reviewed importance of calling if any bleeding. History of breast tenderness April 2012, had a normal mammogram and negative ultrasound. Benign polyp with colonoscopy in 2010.  Normal postmenopausal exam SVT/hyperlipidemia/hyperthyroidism-labs and meds primary care.  Plan: Pap only. SBEs, annual mammogram, encouraged increased exercise, calcium rich diet, vitamin D 1000 daily encouraged. Had a normal DEXA years ago will schedule here. Vaginal dryness- continue vaginal lubricants, local estrogen/vagifem was reviewed if continued problems.   [name] WHNP, 10:02 AM 02/27/2011

## 2011-03-27 ENCOUNTER — Ambulatory Visit (INDEPENDENT_AMBULATORY_CARE_PROVIDER_SITE_OTHER): Payer: BC Managed Care – PPO

## 2011-03-27 DIAGNOSIS — Z780 Asymptomatic menopausal state: Secondary | ICD-10-CM

## 2011-03-27 DIAGNOSIS — M949 Disorder of cartilage, unspecified: Secondary | ICD-10-CM

## 2011-03-27 DIAGNOSIS — M899 Disorder of bone, unspecified: Secondary | ICD-10-CM

## 2011-03-27 DIAGNOSIS — M8580 Other specified disorders of bone density and structure, unspecified site: Secondary | ICD-10-CM

## 2011-03-28 ENCOUNTER — Other Ambulatory Visit: Payer: Self-pay | Admitting: Gynecology

## 2011-03-28 DIAGNOSIS — M8580 Other specified disorders of bone density and structure, unspecified site: Secondary | ICD-10-CM

## 2011-04-01 ENCOUNTER — Ambulatory Visit: Payer: BC Managed Care – PPO | Attending: Family Medicine | Admitting: Physical Therapy

## 2011-04-01 DIAGNOSIS — M542 Cervicalgia: Secondary | ICD-10-CM | POA: Insufficient documentation

## 2011-04-01 DIAGNOSIS — M25519 Pain in unspecified shoulder: Secondary | ICD-10-CM | POA: Insufficient documentation

## 2011-04-01 DIAGNOSIS — IMO0001 Reserved for inherently not codable concepts without codable children: Secondary | ICD-10-CM | POA: Insufficient documentation

## 2011-04-09 ENCOUNTER — Encounter: Payer: BC Managed Care – PPO | Admitting: Physical Therapy

## 2011-04-11 ENCOUNTER — Ambulatory Visit: Payer: BC Managed Care – PPO | Admitting: Physical Therapy

## 2011-06-28 ENCOUNTER — Ambulatory Visit: Payer: BC Managed Care – PPO | Admitting: Physical Therapy

## 2011-07-03 ENCOUNTER — Ambulatory Visit: Payer: BC Managed Care – PPO | Attending: Sports Medicine | Admitting: Physical Therapy

## 2011-07-03 DIAGNOSIS — M542 Cervicalgia: Secondary | ICD-10-CM | POA: Insufficient documentation

## 2011-07-03 DIAGNOSIS — IMO0001 Reserved for inherently not codable concepts without codable children: Secondary | ICD-10-CM | POA: Insufficient documentation

## 2011-07-03 DIAGNOSIS — M25519 Pain in unspecified shoulder: Secondary | ICD-10-CM | POA: Insufficient documentation

## 2011-07-05 ENCOUNTER — Ambulatory Visit: Payer: BC Managed Care – PPO | Admitting: Physical Therapy

## 2011-07-08 ENCOUNTER — Ambulatory Visit: Payer: BC Managed Care – PPO | Admitting: Physical Therapy

## 2011-07-10 ENCOUNTER — Ambulatory Visit: Payer: BC Managed Care – PPO | Admitting: Physical Therapy

## 2011-07-12 ENCOUNTER — Encounter: Payer: Self-pay | Admitting: *Deleted

## 2011-07-12 ENCOUNTER — Emergency Department
Admission: EM | Admit: 2011-07-12 | Discharge: 2011-07-12 | Disposition: A | Discharge status: Home / Self Care | Payer: BC Managed Care – PPO | Source: Home / Self Care

## 2011-07-12 DIAGNOSIS — N390 Urinary tract infection, site not specified: Secondary | ICD-10-CM

## 2011-07-12 DIAGNOSIS — R350 Frequency of micturition: Secondary | ICD-10-CM

## 2011-07-12 LAB — POCT URINALYSIS DIP (MANUAL ENTRY)
Bilirubin, UA: NEGATIVE
Glucose, UA: NEGATIVE
Ketones, POC UA: NEGATIVE
Leukocytes, UA: NEGATIVE
Nitrite, UA: NEGATIVE
Protein Ur, POC: NEGATIVE
Spec Grav, UA: 1.02 (ref 1.005–1.03)
Urobilinogen, UA: 1 (ref 0–1)
pH, UA: 7.5 (ref 5–8)

## 2011-07-12 MED ORDER — CEPHALEXIN 500 MG PO CAPS: 500.0000 mg | ORAL_CAPSULE | Freq: Three times a day (TID) | ORAL | Status: AC

## 2011-07-12 NOTE — ED Notes (Signed)
Patient c/o urinary frequency and "bladder pressure" . Denies hematuria. Drinking cranberry juice.

## 2011-07-12 NOTE — ED Provider Notes (Signed)
Subjective:    Patient ID: [name] is a 61 y.o. female.  Chief Complaint:  HPI Comments: DYSURIA Onset:  3-5 days ago  Description: increased urinary frequency and urgency x 3-5 days. Last UTI was 1 1/2 years ago. Had similar presentation  Modifying factors: Previous of hematuria that has been worked up with cystoscopy, otherwise negative per pt.    Symptoms Urgency:  yes Frequency:  yes Hesitancy:  no Hematuria:  no Flank Pain:  no Fever: no Nausea/Vomiting:  no Missed LMP: no STD exposure: no Discharge: no Irritants: no Rash: no  Red Flags   More than 3 UTI's last 12 months:  no PMH of  Diabetes or Immunosuppression: no  Renal Disease/Calculi: no Urinary Tract Abnormality:  no Instrumentation or Trauma: no    Patient is a 61 y.o. female presenting with frequency. The history is provided by the patient.  Urinary Frequency This is a new problem. The current episode started more than 2 days ago. The problem occurs constantly. Pertinent negatives include no chest pain, no abdominal pain, no headaches and no shortness of breath. The symptoms are aggravated by nothing. Treatments tried: water and cranberry juice     Past Medical History  Diagnosis Date  . Hypothyroidism   . Hyperlipidemia   . SVT (supraventricular tachycardia)     Past Surgical History  Procedure Date  . Cesarean section 1987  . Breast surgery 1976    CYST REMOVED  . Hysteroscopy 9.14.2012    w/myomectomy    Family History  Problem Relation Age of Onset  . Hypertension Mother   . Heart disease Mother   . Hypertension Father   . Heart disease Father   . Hypertension Brother     History  Substance Use Topics  . Smoking status: Never Smoker   . Smokeless tobacco: Never Used  . Alcohol Use: Yes     rare    Review of Systems  Respiratory: Negative for shortness of breath.   Cardiovascular: Negative for chest pain.  Gastrointestinal: Negative for abdominal pain.    Genitourinary: Positive for frequency.  Neurological: Negative for headaches.    Objective:   BP 128/84  Pulse 69  Temp(Src) 98.6 F (37 C) (Oral)  Resp 14  Ht 5\' 3"  (1.6 m)  Wt 160 lb (72.576 kg)  BMI 28.34 kg/m2  SpO2 99%  LMP 11/26/2000  Physical Exam  Constitutional: She appears well-developed and well-nourished.  HENT:  Head: Normocephalic and atraumatic.  Eyes: EOM are normal. Pupils are equal, round, and reactive to light.  Neck: Normal range of motion. Neck supple.  Cardiovascular: Normal rate and regular rhythm.   Pulmonary/Chest: Effort normal and breath sounds normal.  Abdominal: Bowel sounds are normal.       + mild suprapubic tenderness   Urinalysis    Component Value Date/Time   COLORURINE YELLOW 04/04/2007 1302   APPEARANCEUR CLEAR 04/04/2007 1302   LABSPEC >=1.030 08/03/2010 2018   PHURINE 5.5 08/03/2010 2018   GLUCOSEU NEGATIVE 08/03/2010 2018   HGBUR MODERATE* 08/03/2010 2018   BILIRUBINUR negative 07/12/2011 1241   BILIRUBINUR negative 07/12/2011 1241   BILIRUBINUR NEGATIVE 08/03/2010 2018   KETONESUR NEGATIVE 08/03/2010 2018   PROTEINUR NEGATIVE 08/03/2010 2018   UROBILINOGEN 1.0 07/12/2011 1241   UROBILINOGEN 0.2 08/03/2010 2018   NITRITE Negative 07/12/2011 1241   NITRITE NEGATIVE 08/03/2010 2018   LEUKOCYTESUR Negative 07/12/2011 1241       Assessment:   Procedures  The encounter diagnosis was Urinary frequency.  Plan:  Pt clinically with UTI. UA negative for leuks or nitrites. + blood, though this has been a chronic issue per pt and has had negative urological work up. Will treat with keflex 500 TID x 7 days. Urine cx pending.

## 2011-07-14 LAB — URINE CULTURE
Colony Count: NO GROWTH
Organism ID, Bacteria: NO GROWTH

## 2011-07-16 NOTE — ED Provider Notes (Signed)
Agree with Dr. Ol[place] Blas evaluation and treatment.  Lattie Haw, MD 07/16/11 1728

## 2011-08-23 ENCOUNTER — Encounter: Payer: Self-pay | Admitting: Women's Health

## 2011-11-19 ENCOUNTER — Encounter: Payer: Self-pay | Admitting: *Deleted

## 2011-11-19 ENCOUNTER — Emergency Department
Admission: EM | Admit: 2011-11-19 | Discharge: 2011-11-19 | Disposition: A | Discharge status: Home / Self Care | Payer: BC Managed Care – PPO | Source: Home / Self Care

## 2011-11-19 DIAGNOSIS — R110 Nausea: Secondary | ICD-10-CM

## 2011-11-19 DIAGNOSIS — R141 Gas pain: Secondary | ICD-10-CM

## 2011-11-19 DIAGNOSIS — IMO0001 Reserved for inherently not codable concepts without codable children: Secondary | ICD-10-CM

## 2011-11-19 DIAGNOSIS — R142 Eructation: Secondary | ICD-10-CM

## 2011-11-19 DIAGNOSIS — R143 Flatulence: Secondary | ICD-10-CM

## 2011-11-19 HISTORY — DX: Gastro-esophageal reflux disease without esophagitis: K21.9

## 2011-11-19 NOTE — ED Provider Notes (Signed)
History     CSN: [id number]  Arrival date & time 11/19/11  1123   First MD Initiated Contact with Patient 11/19/11 1126      Chief Complaint  Patient presents with  . GI Problem  . Bloated   HPI Comments: Dysphagia x 2 days.  Pt states that she has a feeling of a lump in her throat.  Pt with a prior hx/o GERD that she has been taking nexium intermittently for.  Pt last took medication 3 weeks ago.  Pt also reports mild nausea after eating.  No abdominal pain.  No RUQ discomfort  No hx/o BG disease  Pt also reports mild bloating as well as gas associated with eating.  Pt states that she has been eating spicy food over the last 2-3 days including [place] food.  Pt does report morning hoarseness. No water brash or sore throat.  Pt states that sxs are very mild.      Past Medical History  Diagnosis Date  . Hypothyroidism   . Hyperlipidemia   . SVT (supraventricular tachycardia)   . GERD (gastroesophageal reflux disease)     Past Surgical History  Procedure Date  . Cesarean section 1987  . Breast surgery 1976    CYST REMOVED  . Hysteroscopy 9.14.2012    w/myomectomy    Family History  Problem Relation Age of Onset  . Hypertension Mother   . Heart disease Mother   . Mitral valve prolapse Mother   . Hypertension Father   . Heart disease Father   . Colon polyps Father   . Hypertension Brother   . Mitral valve prolapse Brother     History  Substance Use Topics  . Smoking status: Never Smoker   . Smokeless tobacco: Never Used  . Alcohol Use: Yes     rare    OB History    Grav Para Term Preterm Abortions TAB SAB Ect Mult Living   1 1        1       Review of Systems  All other systems reviewed and are negative.    Allergies  Lidocaine-epinephrine  Home Medications   Current Outpatient Rx  Name Route Sig Dispense Refill  . ATORVASTATIN CALCIUM 10 MG PO TABS Oral Take 5 mg by mouth daily.     . ATORVASTATIN CALCIUM 10 MG PO TABS Oral Take 10 mg by  mouth daily.    [name] CALCIUM CARBONATE 1250 MG PO TABS Oral Take 1 tablet by mouth daily.      [name] CARBOXYMETHYLCELLULOSE SODIUM 1 % OP SOLN Both Eyes Place 1 drop into both eyes 2 (two) times daily as needed. irritation     . VITAMIN D3 3000 UNITS PO TABS Oral Take 1 tablet by mouth daily.      . IBUPROFEN 200 MG PO TABS Oral Take 200 mg by mouth every 6 (six) hours as needed. pain     . LEVOTHYROXINE SODIUM 75 MCG PO TABS Oral Take 75 mcg by mouth daily.      [name] METOPROLOL SUCCINATE ER 25 MG PO TB24 Oral Take 12.5 mg by mouth at bedtime.      [name] ONE-DAILY MULTI VITAMINS PO TABS Oral Take 1 tablet by mouth daily.        BP 112/70  Pulse 87  Temp 98.1 F (36.7 C) (Oral)  Resp 14  Ht 5\' 3"  (1.6 m)  Wt 169 lb (76.658 kg)  BMI 29.94 kg/m2  SpO2 98%  LMP 11/26/2000  Physical Exam  Constitutional: She is oriented to person, place, and time. She appears well-developed and well-nourished.  HENT:  Head: Normocephalic and atraumatic.  Eyes: Conjunctivae are normal. Pupils are equal, round, and reactive to light.  Neck: Normal range of motion. Neck supple.  Cardiovascular: Normal rate and regular rhythm.   Pulmonary/Chest: Effort normal and breath sounds normal.  Abdominal: Soft. Bowel sounds are normal. She exhibits no distension. There is no tenderness. There is no rebound and no guarding.       No RUQ tenderness Mild epigastric tenderness    Musculoskeletal: Normal range of motion.  Neurological: She is alert and oriented to person, place, and time.  Skin: Skin is warm.    ED Course  Procedures (including critical care time)  Labs Reviewed - No data to display No results found.   No diagnosis found.    MDM  Sxs consistent with untreated reflux.   No signs of GB or other intra-abdominal process.  Instructed pt to restart nexium daily as well as trigger food avoidance.  Discussed red flags for reevaluation including persistent sxs despite treatment.     The patient and/or  caregiver has been counseled thoroughly with regard to treatment plan and/or medications prescribed including dosage, schedule, interactions, rationale for use, and possible side effects and they verbalize understanding. Diagnoses and expected course of recovery discussed and will return if not improved as expected or if the condition worsens. Patient and/or caregiver verbalized understanding.             Floydene Flock, MD 11/19/11 1228

## 2011-11-19 NOTE — ED Notes (Addendum)
For 3 days Patient c/o abdominal bloating, excessive gas, burping, sensation of "something stuck in my throat" and nausea pc. She has a hx of GERD but reports the symptoms and more extreme than her typical symptoms. Her last BM was this morning.

## 2011-11-21 NOTE — ED Provider Notes (Signed)
Agree with exam, assessment, and plan.   Covey Baller A Tamim Skog, MD 11/21/11 1553 

## 2011-11-25 ENCOUNTER — Encounter: Payer: Self-pay | Admitting: Gynecology

## 2011-11-25 ENCOUNTER — Ambulatory Visit (INDEPENDENT_AMBULATORY_CARE_PROVIDER_SITE_OTHER): Payer: BC Managed Care – PPO | Admitting: Gynecology

## 2011-11-25 VITALS — BP 120/82

## 2011-11-25 DIAGNOSIS — N898 Other specified noninflammatory disorders of vagina: Secondary | ICD-10-CM

## 2011-11-25 DIAGNOSIS — B9689 Other specified bacterial agents as the cause of diseases classified elsewhere: Secondary | ICD-10-CM

## 2011-11-25 DIAGNOSIS — N760 Acute vaginitis: Secondary | ICD-10-CM

## 2011-11-25 DIAGNOSIS — R300 Dysuria: Secondary | ICD-10-CM

## 2011-11-25 DIAGNOSIS — A499 Bacterial infection, unspecified: Secondary | ICD-10-CM

## 2011-11-25 DIAGNOSIS — N644 Mastodynia: Secondary | ICD-10-CM

## 2011-11-25 LAB — WET PREP FOR TRICH, YEAST, CLUE
Trich, Wet Prep: NONE SEEN
Yeast Wet Prep HPF POC: NONE SEEN

## 2011-11-25 LAB — URINALYSIS W MICROSCOPIC + REFLEX CULTURE
Bilirubin Urine: NEGATIVE
Casts: NONE SEEN
Crystals: NONE SEEN
Glucose, UA: NEGATIVE mg/dL
Ketones, ur: NEGATIVE mg/dL
Leukocytes, UA: NEGATIVE
Nitrite: NEGATIVE
Protein, ur: NEGATIVE mg/dL
Specific Gravity, Urine: 1.025 (ref 1.005–1.030)
Urobilinogen, UA: 0.2 mg/dL (ref 0.0–1.0)
WBC, UA: NONE SEEN WBC/hpf (ref <3)
pH: 5 (ref 5.0–8.0)

## 2011-11-25 MED ORDER — METRONIDAZOLE 500 MG PO TABS: 500.0000 mg | ORAL_TABLET | Freq: Two times a day (BID) | ORAL | Status: AC

## 2011-11-25 MED ORDER — TERCONAZOLE 0.8 % VA CREA: 1.0000 | TOPICAL_CREAM | Freq: Every day | VAGINAL | Status: AC

## 2011-11-25 MED ORDER — METRONIDAZOLE 500 MG PO TABS: 500.0000 mg | ORAL_TABLET | Freq: Two times a day (BID) | ORAL | Status: DC

## 2011-11-25 NOTE — Patient Instructions (Addendum)
Breast Tenderness Breast tenderness is a common complaint made by women of all ages. It is also called mastalgia or mastodynia, which means breast pain. The condition can range from mild discomfort to severe pain. It has a variety of causes. Your caregiver will find out the likely cause of your breast tenderness by examining your breasts, asking you about symptoms and perhaps ordering some tests. Breast tenderness usually does not mean you have breast cancer. CAUSES  Breast tenderness has many possible causes. They include:  Premenstrual changes. A week to 10 days before your period, your breasts might ache or feel tender.   Other hormonal causes. These include:   When sexual and physical traits mature (puberty).   Pregnancy.   The time right before and the year after menopause (perimenopause).   The day when it has been 12 months since your last period (menopause).   Large breasts.   Infection (also called mastitis).   Birth control pills.   Breastfeeding. Tenderness can occur if the breasts are overfull with milk or if a milk duct is blocked.   Injury.   Fibrocystic breast changes. This is not cancer (benign). It causes painful breasts that feel lumpy.   Fluid-filled sacs (cysts). Often cysts can be drained in your healthcare provider's office.   Fibroadenoma. This is a tumor that is not cancerous.   Medication side effects. Blood pressure drugs and diuretics (which increase urine flow) sometimes cause breast tenderness.   Previous breast surgery, such as a breast reduction.   Breast cancer. Cancer is rarely the reason breasts are tender. In most women, tenderness is caused by something else.  DIAGNOSIS  Several methods can be used to find out why your breasts are tender. They include:  Visual inspection of the breasts.   Examination by hand.   Tests, such as:   Mammogram.   Ultrasound.   Biopsy.   Lab test of any fluid coming from the nipple.   Blood tests.     MRI.  TREATMENT  Treatment is directed to the cause of the breast tenderness from doing nothing for minor discomfort, wearing a good support bra but also may include:  Taking over-the-counter medicines for pain or discomfort as directed by your caregiver.   Prescription medicine for breast tenderness related to:   Premenstrual.   Fibrocystic.   Puberty.   Pregnancy.   Menopause.   Previous breast surgery.   Large breasts.   Antibiotics for infection.   Birth control pills for fibrocystic and premenstrual changes.   More frequent feedings or pumping of the breasts and warm compresses for breast engorgement when nursing.   Cold and warm compresses and a good support bra for most breast injuries.   Breast cysts are sometimes drained with a needle (aspiration) or removed with minor surgery.   Fibroadenomas are usually removed with minor surgery.   Changing or stopping the medicine when it is responsible for causing the breast tenderness.   When breast cancer is present with or without causing pain, it is usually treated with major surgery (with or without radiation) and chemotherapy.  HOME CARE INSTRUCTIONS  Breast tenderness often can be handled at home. You can try:  Getting fitted for a new bra that provides more support, especially during exercise.   Wearing a more supportive or sports bra while sleeping when your breasts are very tender.   If you have a breast injury, using an ice pack for 15 to 20 minutes. Wrap the pack in a   towel. Do not put the ice pack directly on your breast.   If your breasts are too full of milk as a result of breastfeeding, try:   Expressing milk either by hand or with a breast pump.   Applying a warm compress for relief.   Taking over-the-counter pain relievers, if this is OK with your caregiver.   Taking medicine that your caregiver prescribes. These might include antibiotics or birth control pills.  Over the long term, your  breast tenderness might be eased if you:  Cut down on caffeine.   Reduce the amount of fat in your diet.  Also, learn how to do breast examinations at home. This will help you tell when you have an unusual growth or lump that could cause tenderness. And keep a log of the days and times when your breasts are most tender. This will help you and your caregiver find the right solution. SEEK MEDICAL CARE IF:   Any part of your breast is hard, red and hot to the touch. This could be a sign of infection.   Fluid is coming out of your nipples (and you are not breastfeeding). Especially watch for blood or pus.   You have a fever as well as breast tenderness.   You have a new or painful lump in your breast that remains after your period ends.   You have tried to take care of the pain at home, but it has not gone away.   Your breast pain is getting worse. Or, the pain is making it hard to do the things you usually do during your day.  Document Released: 03/21/2008 Document Revised: 03/28/2011 Document Reviewed: 03/21/2008 [facility] Patient Information 2012 [place], [place].  Bacterial Vaginosis Bacterial vaginosis (BV) is a vaginal infection where the normal balance of bacteria in the vagina is disrupted. The normal balance is then replaced by an overgrowth of certain bacteria. There are several different kinds of bacteria that can cause BV. BV is the most common vaginal infection in women of childbearing age. CAUSES   The cause of BV is not fully understood. BV develops when there is an increase or imbalance of harmful bacteria.   Some activities or behaviors can upset the normal balance of bacteria in the vagina and put women at increased risk including:   Having a new sex partner or multiple sex partners.   Douching.   Using an intrauterine device (IUD) for contraception.   It is not clear what role sexual activity plays in the development of BV. However, women that have never had sexual  intercourse are rarely infected with BV.  Women do not get BV from toilet seats, bedding, swimming pools or from touching objects around them.  SYMPTOMS   Grey vaginal discharge.   A fish-like odor with discharge, especially after sexual intercourse.   Itching or burning of the vagina and vulva.   Burning or pain with urination.   Some women have no signs or symptoms at all.  DIAGNOSIS  Your caregiver must examine the vagina for signs of BV. Your caregiver will perform lab tests and look at the sample of vaginal fluid through a microscope. They will look for bacteria and abnormal cells (clue cells), a pH test higher than 4.5, and a positive amine test all associated with BV.  RISKS AND COMPLICATIONS   Pelvic inflammatory disease (PID).   Infections following gynecology surgery.   Developing HIV.   Developing herpes virus.  TREATMENT  Sometimes BV will clear up  without treatment. However, all women with symptoms of BV should be treated to avoid complications, especially if gynecology surgery is planned. Female partners generally do not need to be treated. However, BV may spread between female sex partners so treatment is helpful in preventing a recurrence of BV.   BV may be treated with antibiotics. The antibiotics come in either pill or vaginal cream forms. Either can be used with nonpregnant or pregnant women, but the recommended dosages differ. These antibiotics are not harmful to the baby.   BV can recur after treatment. If this happens, a second round of antibiotics will often be prescribed.   Treatment is important for pregnant women. If not treated, BV can cause a premature delivery, especially for a pregnant woman who had a premature birth in the past. All pregnant women who have symptoms of BV should be checked and treated.   For chronic reoccurrence of BV, treatment with a type of prescribed gel vaginally twice a week is helpful.  HOME CARE INSTRUCTIONS   Finish all  medication as directed by your caregiver.   Do not have sex until treatment is completed.   Tell your sexual partner that you have a vaginal infection. They should see their caregiver and be treated if they have problems, such as a mild rash or itching.   Practice safe sex. Use condoms. Only have 1 sex partner.  PREVENTION  Basic prevention steps can help reduce the risk of upsetting the natural balance of bacteria in the vagina and developing BV:  Do not have sexual intercourse (be abstinent).   Do not douche.   Use all of the medicine prescribed for treatment of BV, even if the signs and symptoms go away.   Tell your sex partner if you have BV. That way, they can be treated, if needed, to prevent reoccurrence.  SEEK MEDICAL CARE IF:   Your symptoms are not improving after 3 days of treatment.   You have increased discharge, pain, or fever.  MAKE SURE YOU:   Understand these instructions.   Will watch your condition.   Will get help right away if you are not doing well or get worse.  FOR MORE INFORMATION  Division of STD Prevention (DSTDP), Centers for Disease Control and Prevention: [employer] American Social Health Association (ASHA): www.ashastd.org  Document Released: 04/08/2005 Document Revised: 03/28/2011 Document Reviewed: 09/29/2008 [facility] Patient Information 2012 [place], [place].  Vitamin E 600 units. One tablet daily for breast tenderness

## 2011-11-25 NOTE — Progress Notes (Signed)
61 year old patient who presented to the office today complaining of vaginal discharge for for 5 days and this morning had some irritation when she urinated. She denied any frequency or dysuria per se. She denies any back pain fever chills nausea or vomiting. Patient also has had some mastodynia past few days. She had a normal mammogram in May of this year. She stated that her breast tenderness she noticed that when she started drinking more ice tea. In 1976 patient had a right breast cyst removed which was benign.  Exam: Breast: Breasts were examined in the sitting and supine position there were symmetrical in appearance no skin discoloration or nipple inversion no palpable masses or tenderness no supraclavicular axillary lymphadenopathy.  Pelvic: Bartholin urethra Skene was within normal limits Vagina: Slight white discharge with odor Cervix: No lesions noted Uterus: Not examined Adnexa: Not examined Rectum: Not examined  Urinalysis was negative Wet prep demonstrated evidence of bacterial vaginosis and moniliasis  Assessment/plan: Patient will be treated with Flagyl 500 mg twice a day for 5 days for bacterial vaginosis and she will used Terazol vaginal cream for 3 days her moniliasis. She was encouraged to take a vitamin E6 100 units daily and to cut down on her tea to help with her mastodynia.

## 2012-01-22 DIAGNOSIS — R000 Tachycardia, unspecified: Secondary | ICD-10-CM | POA: Insufficient documentation

## 2012-03-07 ENCOUNTER — Encounter: Payer: Self-pay | Admitting: *Deleted

## 2012-03-07 ENCOUNTER — Emergency Department
Admission: EM | Admit: 2012-03-07 | Discharge: 2012-03-07 | Disposition: A | Discharge status: Home / Self Care | Payer: BC Managed Care – PPO | Source: Home / Self Care | Attending: Emergency Medicine | Admitting: Emergency Medicine

## 2012-03-07 DIAGNOSIS — J309 Allergic rhinitis, unspecified: Secondary | ICD-10-CM

## 2012-03-07 MED ORDER — FLUTICASONE PROPIONATE 50 MCG/ACT NA SUSP: 2.0000 | Freq: Every day | NASAL | Status: DC

## 2012-03-07 NOTE — ED Provider Notes (Signed)
History     CSN: [id number]  Arrival date & time 03/07/12  1136   First MD Initiated Contact with Patient 03/07/12 1219      Chief Complaint  Patient presents with  . Sinus Problem    (Consider location/radiation/quality/duration/timing/severity/associated sxs/prior treatment) HPI [name] is a 61 y.o. female who complains of onset of cold symptoms for 6-9 months.  The symptoms are constant and mild in severity.  She went to an ENT twice and then to a GI who did an endoscopy.  Nothing was found.  She has tried intermittently to use nasal sprays that she be given.  She is getting frustrated because of the continued chest congestion and postnasal dripping. + sore throat +/- cough No pleuritic pain No wheezing + nasal congestion + post-nasal drainage + sinus pain/pressure + chest congestion No itchy/red eyes No earache No hemoptysis No SOB No chills/sweats No fever No nausea No vomiting No abdominal pain No diarrhea No skin rashes No fatigue No myalgias No headache    Past Medical History  Diagnosis Date  . Hypothyroidism   . Hyperlipidemia   . SVT (supraventricular tachycardia)   . GERD (gastroesophageal reflux disease)     Past Surgical History  Procedure Date  . Cesarean section 1987  . Breast surgery 1976    CYST REMOVED  . Hysteroscopy 9.14.2012    w/myomectomy    Family History  Problem Relation Age of Onset  . Hypertension Mother   . Heart disease Mother   . Mitral valve prolapse Mother   . Hypertension Father   . Heart disease Father   . Colon polyps Father   . Hypertension Brother   . Mitral valve prolapse Brother     History  Substance Use Topics  . Smoking status: Never Smoker   . Smokeless tobacco: Never Used  . Alcohol Use: Yes     Comment: rare    OB History    Grav Para Term Preterm Abortions TAB SAB Ect Mult Living   1 1        1       Review of Systems  All other systems reviewed and are negative.    Allergies    Lidocaine-epinephrine  Home Medications   Current Outpatient Rx  Name  Route  Sig  Dispense  Refill  . ATORVASTATIN CALCIUM 10 MG PO TABS   Oral   Take 5 mg by mouth daily.          . ATORVASTATIN CALCIUM 10 MG PO TABS   Oral   Take 10 mg by mouth daily.         [name] CALCIUM CARBONATE 1250 MG PO TABS   Oral   Take 1 tablet by mouth daily.           [name] CARBOXYMETHYLCELLULOSE SODIUM 1 % OP SOLN   Both Eyes   Place 1 drop into both eyes 2 (two) times daily as needed. irritation          . VITAMIN D3 3000 UNITS PO TABS   Oral   Take 1 tablet by mouth daily.           [name] FLUTICASONE PROPIONATE 50 MCG/ACT NA SUSP   Nasal   Place 2 sprays into the nose daily.   16 g   2   . IBUPROFEN 200 MG PO TABS   Oral   Take 200 mg by mouth every 6 (six) hours as needed. pain          .  LEVOTHYROXINE SODIUM 75 MCG PO TABS   Oral   Take 75 mcg by mouth daily.           [name] METOPROLOL SUCCINATE ER 25 MG PO TB24   Oral   Take 12.5 mg by mouth at bedtime.           [name] ONE-DAILY MULTI VITAMINS PO TABS   Oral   Take 1 tablet by mouth daily.             BP 113/76  Pulse 80  Temp 98 F (36.7 C) (Oral)  Resp 14  Ht 5\' 3"  (1.6 m)  Wt 169 lb 8 oz (76.885 kg)  BMI 30.03 kg/m2  SpO2 99%  LMP 11/26/2000  Physical Exam  Nursing note and vitals reviewed. Constitutional: She is oriented to person, place, and time. She appears well-developed and well-nourished.  HENT:  Head: Normocephalic and atraumatic.  Eyes: No scleral icterus.  Neck: Neck supple.  Cardiovascular: Regular rhythm and normal heart sounds.   Pulmonary/Chest: Effort normal and breath sounds normal. No respiratory distress.  Neurological: She is alert and oriented to person, place, and time.  Skin: Skin is warm and dry.  Psychiatric: She has a normal mood and affect. Her speech is normal.    ED Course  Procedures (including critical care time)  Labs Reviewed - No data to display No results  found.   1. Allergic rhinitis, cause unspecified       MDM   This sounds like allergic rhinitis to me.  I do not believe this is a GI issue.  Because she's been having some difficulty with the ENT per her, I would like her instead to see an allergist and see if they can figure out what's causing this.  We will also put her on Flonase but I've told her to use regularly for the next 2-3 weeks at least as see if this helps her symptoms prior to going to the allergist.  She also tried over-the-counter antihistamine as well.  Also hydration and nasal saline may help as well.  If worsening or fevers or new symptoms and can followup with her PCP.     Marlaine Hind, MD 03/07/12 [phone]

## 2012-03-07 NOTE — ED Notes (Signed)
Patient c/o sinus pressure, sore throat, dry cough and SOB with exertion x 6-9 months. States she went to ENT but they stated it was GERD; then she went to Gastroenterology and a endoscopy was performed and they stated it was mucous buildup.

## 2012-04-27 ENCOUNTER — Encounter: Payer: Self-pay | Admitting: Women's Health

## 2012-04-27 ENCOUNTER — Ambulatory Visit (INDEPENDENT_AMBULATORY_CARE_PROVIDER_SITE_OTHER): Payer: BC Managed Care – PPO | Admitting: Women's Health

## 2012-04-27 VITALS — BP 110/64 | Ht 62.0 in | Wt 169.0 lb

## 2012-04-27 DIAGNOSIS — I471 Supraventricular tachycardia: Secondary | ICD-10-CM | POA: Insufficient documentation

## 2012-04-27 DIAGNOSIS — E039 Hypothyroidism, unspecified: Secondary | ICD-10-CM

## 2012-04-27 DIAGNOSIS — I498 Other specified cardiac arrhythmias: Secondary | ICD-10-CM

## 2012-04-27 DIAGNOSIS — E785 Hyperlipidemia, unspecified: Secondary | ICD-10-CM

## 2012-04-27 DIAGNOSIS — Z01419 Encounter for gynecological examination (general) (routine) without abnormal findings: Secondary | ICD-10-CM

## 2012-04-27 NOTE — Progress Notes (Signed)
[name] [name] 08/06/1950 [id number]    History:    The patient presents for annual exam.  Postmenopausal with no bleeding. Benign endometrial polypectomy October 2012 per Dr. Lily Peer. History of SVT/hypothyroidism/hypercholesterolemia-primary care manages. History of a benign colon polyp 2010. History of normal Paps and mammograms. Osteopenia T score right femoral neck -1.7. FRAX 7.3/ 0.4%  12/12012.   Past medical history, past surgical history, family history and social history were all reviewed and documented in the EPIC chart. 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 Lauren 26 doing well. Numerous family members with hypertension. Husband died in MVA at 36.   ROS:  A  ROS was performed and pertinent positives and negatives are included in the history.  Exam:  Filed Vitals:   04/27/12 1043  BP: 110/64    General appearance:  Normal Head/Neck:  Normal, without cervical or supraclavicular adenopathy. Thyroid:  Symmetrical, normal in size, without palpable masses or nodularity. Respiratory  Effort:  Normal  Auscultation:  Clear without wheezing or rhonchi Cardiovascular  Auscultation:  Regular rate, without rubs, murmurs or gallops  Edema/varicosities:  Not grossly evident Abdominal  Soft,nontender, without masses, guarding or rebound.  Liver/spleen:  No organomegaly noted  Hernia:  None appreciated  Skin  Inspection:  Grossly normal  Palpation:  Grossly normal Neurologic/psychiatric  Orientation:  Normal with appropriate conversation.  Mood/affect:  Normal  Genitourinary    Breasts: Examined lying and sitting.     Right: Without masses, retractions, discharge or axillary adenopathy.     Left: Without masses, retractions, discharge or axillary adenopathy.   Inguinal/mons:  Normal without inguinal adenopathy  External genitalia:  Normal  BUS/Urethra/Skene's glands:  Normal  Bladder:  Normal  Vagina:  Normal  Cervix:  Normal  Uterus:   normal in size, shape and contour.   Midline and mobile  Adnexa/parametria:     Rt: Without masses or tenderness.   Lt: Without masses or tenderness.  Anus and perineum: Normal  Digital rectal exam: Normal sphincter tone without palpated masses or tenderness  Assessment/Plan:  62 y.o. WBF G1 P1 for annual exam.  Benign endometrial polyp 01/2011 Osteopenia  Hypothyroidism/hyperlipidemia/SVT-primary care labs and meds. Benign colon polyp 2010  Plan: Reviewed importance of calcium rich diet, vitamin D 2000 daily, increase regular exercise, home safety and fall prevention discussed. Repeat DEXA in one year. SBE's, continue annual mammogram. Pap normal 2012. New screening guidelines reviewed. Instructed to call if any further bleeding. Home Hemoccult card given. Repeat colonoscopy 2015.    [facility], 1:41 PM 04/27/2012

## 2012-04-27 NOTE — Patient Instructions (Addendum)

## 2012-04-28 LAB — URINALYSIS W MICROSCOPIC + REFLEX CULTURE
Bilirubin Urine: NEGATIVE
Casts: NONE SEEN
Glucose, UA: NEGATIVE mg/dL
Ketones, ur: NEGATIVE mg/dL
Leukocytes, UA: NEGATIVE
Nitrite: NEGATIVE
Protein, ur: NEGATIVE mg/dL
Specific Gravity, Urine: 1.019 (ref 1.005–1.030)
Urobilinogen, UA: 0.2 mg/dL (ref 0.0–1.0)
pH: 5.5 (ref 5.0–8.0)

## 2012-04-29 LAB — URINE CULTURE
Colony Count: NO GROWTH
Organism ID, Bacteria: NO GROWTH

## 2012-06-06 ENCOUNTER — Other Ambulatory Visit: Payer: Self-pay

## 2012-06-24 ENCOUNTER — Other Ambulatory Visit: Payer: Self-pay | Admitting: Gynecology

## 2012-06-24 ENCOUNTER — Other Ambulatory Visit: Payer: Self-pay | Admitting: Anesthesiology

## 2012-06-24 DIAGNOSIS — Z1211 Encounter for screening for malignant neoplasm of colon: Secondary | ICD-10-CM

## 2012-06-24 DIAGNOSIS — K921 Melena: Secondary | ICD-10-CM

## 2012-09-14 ENCOUNTER — Emergency Department (INDEPENDENT_AMBULATORY_CARE_PROVIDER_SITE_OTHER): Payer: BC Managed Care – PPO

## 2012-09-14 ENCOUNTER — Emergency Department
Admission: EM | Admit: 2012-09-14 | Discharge: 2012-09-14 | Disposition: A | Discharge status: Home / Self Care | Payer: BC Managed Care – PPO | Source: Home / Self Care | Attending: Family Medicine | Admitting: Family Medicine

## 2012-09-14 ENCOUNTER — Encounter: Payer: Self-pay | Admitting: Emergency Medicine

## 2012-09-14 DIAGNOSIS — M5137 Other intervertebral disc degeneration, lumbosacral region: Secondary | ICD-10-CM

## 2012-09-14 DIAGNOSIS — M5136 Other intervertebral disc degeneration, lumbar region: Secondary | ICD-10-CM

## 2012-09-14 DIAGNOSIS — M47817 Spondylosis without myelopathy or radiculopathy, lumbosacral region: Secondary | ICD-10-CM

## 2012-09-14 DIAGNOSIS — S339XXA Sprain of unspecified parts of lumbar spine and pelvis, initial encounter: Secondary | ICD-10-CM

## 2012-09-14 DIAGNOSIS — M549 Dorsalgia, unspecified: Secondary | ICD-10-CM

## 2012-09-14 DIAGNOSIS — S233XXA Sprain of ligaments of thoracic spine, initial encounter: Secondary | ICD-10-CM

## 2012-09-14 DIAGNOSIS — S39012A Strain of muscle, fascia and tendon of lower back, initial encounter: Secondary | ICD-10-CM

## 2012-09-14 DIAGNOSIS — S239XXA Sprain of unspecified parts of thorax, initial encounter: Secondary | ICD-10-CM

## 2012-09-14 MED ORDER — PREDNISONE 50 MG PO TABS: ORAL_TABLET | ORAL | Status: DC

## 2012-09-14 NOTE — ED Notes (Signed)
Reports pain in lumbar/lower back since air travel yesterday. States had upper back and neck pain over past 10 days, but that resolved.

## 2012-09-14 NOTE — ED Provider Notes (Signed)
History     CSN: [id number]  Arrival date & time 09/14/12  1307   First MD Initiated Contact with Patient 09/14/12 1310      Chief Complaint  Patient presents with  . Back Pain   HPI  Mid back and lumbar pain x 1 week  Pt went on vacation  Was carrying heavy luggage as well as heavy overhead bag.  Had some mild mid and low back pain while on vacation. Took some ibuprofen and pain improved.  Has had progressive low/mid back pain over last 2-3 days.  Pain 5/10 at its worst Worse with prolonged sitting and bending Improved with rest.  Has had some mild bilateral buttock and distal LE tingling No numbness No bowel/bladder anesthesia.  Known prior hx/o lumbar DJD  Past Medical History  Diagnosis Date  . Hypothyroidism   . Hyperlipidemia   . SVT (supraventricular tachycardia)   . GERD (gastroesophageal reflux disease)     Past Surgical History  Procedure Laterality Date  . Cesarean section  1987  . Breast surgery  1976    CYST REMOVED  . Hysteroscopy  9.14.2012    w/myomectomy    Family History  Problem Relation Age of Onset  . Hypertension Mother   . Heart disease Mother   . Mitral valve prolapse Mother   . Hypertension Father   . Heart disease Father   . Colon polyps Father   . Hypertension Brother   . Mitral valve prolapse Brother     History  Substance Use Topics  . Smoking status: Never Smoker   . Smokeless tobacco: Never Used  . Alcohol Use: Yes     Comment: rare    OB History   Grav Para Term Preterm Abortions TAB SAB Ect Mult Living   1 1 1       1       Review of Systems  All other systems reviewed and are negative.    Allergies  Lidocaine-epinephrine  Home Medications   Current Outpatient Rx  Name  Route  Sig  Dispense  Refill  . atorvastatin (LIPITOR) 10 MG tablet   Oral   Take 5 mg by mouth daily.          . calcium carbonate (OS-CAL - DOSED IN MG OF ELEMENTAL CALCIUM) 1250 MG tablet   Oral   Take 1 tablet by mouth daily.            . carboxymethylcellulose (REFRESH) 1 % ophthalmic solution   Both Eyes   Place 1 drop into both eyes 2 (two) times daily as needed. irritation          . Cholecalciferol (VITAMIN D3) 3000 UNITS TABS   Oral   Take 1 tablet by mouth daily.           . fluticasone (FLONASE) 50 MCG/ACT nasal spray   Nasal   Place 2 sprays into the nose daily.   16 g   2   . ibuprofen (ADVIL,MOTRIN) 200 MG tablet   Oral   Take 200 mg by mouth every 6 (six) hours as needed. pain          . levothyroxine (SYNTHROID, LEVOTHROID) 75 MCG tablet   Oral   Take 75 mcg by mouth daily.           . metoprolol succinate (TOPROL-XL) 25 MG 24 hr tablet   Oral   Take 12.5 mg by mouth at bedtime.           [name]  Multiple Vitamin (MULTIVITAMIN) tablet   Oral   Take 1 tablet by mouth daily.             BP 119/77  Pulse 76  Temp(Src) 97.9 F (36.6 C) (Oral)  Resp 16  Ht 5\' 3"  (1.6 m)  Wt 166 lb (75.297 kg)  BMI 29.41 kg/m2  SpO2 98%  LMP 11/26/2000  Physical Exam  Constitutional: She appears well-developed and well-nourished.  HENT:  Head: Normocephalic and atraumatic.  Eyes: Conjunctivae are normal. Pupils are equal, round, and reactive to light.  Neck: Normal range of motion.  Cardiovascular: Normal rate and regular rhythm.   Pulmonary/Chest: Effort normal.  Abdominal: Soft.  Musculoskeletal:       Arms: + TTP over affected area Full ROM  Mild pain with back flexion FABER negative bilaterally Neurovascularly intact distally    Skin: Skin is warm.    ED Course  Procedures (including critical care time)  Labs Reviewed - No data to display Dg Thoracic Spine 2 View  09/14/2012   *RADIOLOGY REPORT*  Clinical Data: 2-day history of back pain.  No known injury.  THORACIC SPINE - 2 VIEW  Comparison: Bone window images from CTA chest 03/10/2010.  Findings: Difficult examination due to body habitus.  Upper thoracic spine not included on the lateral image.  12 rib-bearing  thoracic vertebra with anatomic alignment.  No fractures.  Well- preserved disc spaces without evidence of spondylosis.  Pedicles intact.  Paravertebral soft tissues unremarkable.  IMPRESSION: Normal examination.   Original Report Authenticated By: Hulan Saas, M.D.   Dg Lumbar Spine Complete  09/14/2012   *RADIOLOGY REPORT*  Clinical Data: Low back pain and bilateral lower extremity radiculopathy with burning sensation in both buttocks over the past 2 days, no known injury.  LUMBAR SPINE - COMPLETE 4+ VIEW  Comparison: Bone window images from CT abdomen and pelvis 01/04/2011.  Findings: Five non-rib bearing lumbar vertebrae with anatomic alignment.  No fractures. Moderate disc space narrowing and mild endplate hypertrophic changes at L5-S1, unchanged.  Moderate disc space narrowing at L4-5, progressive.  Remaining disc spaces well preserved.  No pars defects.  Mild facet degenerative changes on the right at L5-S1.  Visualized sacroiliac joints intact.  IMPRESSION: No acute osseous abnormality.  Moderate degenerative disc disease and spondylosis at L5-S1 and L4-5, stable at L5-S1 and progressive at L4-5 since 2012.   Original Report Authenticated By: Hulan Saas, M.D.     1. Thoracic sprain and strain, initial encounter   2. Lumbosacral strain, initial encounter   3. Degenerative disc disease, lumbar       MDM  Will place on course of prednisone as there is some radicular component of sxs.  Discussed general and MSK red flags.  Plan for follow up with ortho in 1-2 weeks.  Otherwise follow up as needed.     The patient and/or caregiver has been counseled thoroughly with regard to treatment plan and/or medications prescribed including dosage, schedule, interactions, rationale for use, and possible side effects and they verbalize understanding. Diagnoses and expected course of recovery discussed and will return if not improved as expected or if the condition worsens. Patient and/or  caregiver verbalized understanding.             Doree Albee, MD 09/14/12 [phone]

## 2012-12-02 ENCOUNTER — Other Ambulatory Visit: Payer: Self-pay | Admitting: Gastroenterology

## 2012-12-02 DIAGNOSIS — R109 Unspecified abdominal pain: Secondary | ICD-10-CM

## 2012-12-07 ENCOUNTER — Other Ambulatory Visit: Payer: BC Managed Care – PPO

## 2012-12-08 ENCOUNTER — Ambulatory Visit
Admission: RE | Admit: 2012-12-08 | Discharge: 2012-12-08 | Disposition: A | Discharge status: Home / Self Care | Payer: BC Managed Care – PPO | Source: Ambulatory Visit | Attending: Gastroenterology | Admitting: Gastroenterology

## 2012-12-08 DIAGNOSIS — R109 Unspecified abdominal pain: Secondary | ICD-10-CM

## 2012-12-21 HISTORY — PX: OTHER SURGICAL HISTORY: SHX169

## 2013-01-17 ENCOUNTER — Emergency Department (HOSPITAL_COMMUNITY): Payer: BC Managed Care – PPO

## 2013-01-17 ENCOUNTER — Encounter (HOSPITAL_COMMUNITY): Payer: Self-pay | Admitting: *Deleted

## 2013-01-17 ENCOUNTER — Emergency Department (HOSPITAL_COMMUNITY)
Admission: EM | Admit: 2013-01-17 | Discharge: 2013-01-17 | Disposition: A | Discharge status: Home / Self Care | Payer: BC Managed Care – PPO | Attending: Emergency Medicine | Admitting: Emergency Medicine

## 2013-01-17 DIAGNOSIS — R000 Tachycardia, unspecified: Secondary | ICD-10-CM | POA: Insufficient documentation

## 2013-01-17 DIAGNOSIS — R002 Palpitations: Secondary | ICD-10-CM

## 2013-01-17 DIAGNOSIS — R0602 Shortness of breath: Secondary | ICD-10-CM | POA: Insufficient documentation

## 2013-01-17 DIAGNOSIS — Z8679 Personal history of other diseases of the circulatory system: Secondary | ICD-10-CM | POA: Insufficient documentation

## 2013-01-17 DIAGNOSIS — Z79899 Other long term (current) drug therapy: Secondary | ICD-10-CM | POA: Insufficient documentation

## 2013-01-17 DIAGNOSIS — E039 Hypothyroidism, unspecified: Secondary | ICD-10-CM | POA: Insufficient documentation

## 2013-01-17 DIAGNOSIS — Z8719 Personal history of other diseases of the digestive system: Secondary | ICD-10-CM | POA: Insufficient documentation

## 2013-01-17 DIAGNOSIS — R209 Unspecified disturbances of skin sensation: Secondary | ICD-10-CM | POA: Insufficient documentation

## 2013-01-17 LAB — URINALYSIS, ROUTINE W REFLEX MICROSCOPIC
Bilirubin Urine: NEGATIVE
Glucose, UA: NEGATIVE mg/dL
Ketones, ur: NEGATIVE mg/dL
Leukocytes, UA: NEGATIVE
Nitrite: NEGATIVE
Protein, ur: NEGATIVE mg/dL
Specific Gravity, Urine: 1.008 (ref 1.005–1.030)
Urobilinogen, UA: 0.2 mg/dL (ref 0.0–1.0)
pH: 7.5 (ref 5.0–8.0)

## 2013-01-17 LAB — BASIC METABOLIC PANEL
BUN: 14 mg/dL (ref 6–23)
CO2: 25 mEq/L (ref 19–32)
Calcium: 10.3 mg/dL (ref 8.4–10.5)
Chloride: 104 mEq/L (ref 96–112)
Creatinine, Ser: 0.82 mg/dL (ref 0.50–1.10)
GFR calc Af Amer: 88 mL/min — ABNORMAL LOW (ref >90)
GFR calc non Af Amer: 76 mL/min — ABNORMAL LOW (ref >90)
Glucose, Bld: 116 mg/dL — ABNORMAL HIGH (ref 70–99)
Potassium: 3.8 mEq/L (ref 3.5–5.1)
Sodium: 140 mEq/L (ref 135–145)

## 2013-01-17 LAB — URINE MICROSCOPIC-ADD ON: Urine-Other: NONE SEEN

## 2013-01-17 LAB — CBC WITH DIFFERENTIAL/PLATELET
Basophils Absolute: 0 10*3/uL (ref 0.0–0.1)
Basophils Relative: 0 % (ref 0–1)
Eosinophils Absolute: 0 10*3/uL (ref 0.0–0.7)
Eosinophils Relative: 1 % (ref 0–5)
HCT: 37.6 % (ref 36.0–46.0)
Hemoglobin: 13.5 g/dL (ref 12.0–15.0)
Lymphocytes Relative: 33 % (ref 12–46)
Lymphs Abs: 1.5 10*3/uL (ref 0.7–4.0)
MCH: 28.8 pg (ref 26.0–34.0)
MCHC: 35.9 g/dL (ref 30.0–36.0)
MCV: 80.3 fL (ref 78.0–100.0)
Monocytes Absolute: 0.3 10*3/uL (ref 0.1–1.0)
Monocytes Relative: 6 % (ref 3–12)
Neutro Abs: 2.8 10*3/uL (ref 1.7–7.7)
Neutrophils Relative %: 61 % (ref 43–77)
Platelets: 153 10*3/uL (ref 150–400)
RBC: 4.68 MIL/uL (ref 3.87–5.11)
RDW: 14.8 % (ref 11.5–15.5)
WBC: 4.6 10*3/uL (ref 4.0–10.5)

## 2013-01-17 LAB — TROPONIN I: Troponin I: <0.3 ng/mL (ref <0.30)

## 2013-01-17 MED ORDER — IOHEXOL 350 MG/ML SOLN
100.0000 mL | Freq: Once | INTRAVENOUS | Status: AC | PRN
  Administered 2013-01-17: 100 mL via INTRAVENOUS

## 2013-01-17 MED ORDER — SODIUM CHLORIDE 0.9 % IV SOLN: INTRAVENOUS | Status: DC

## 2013-01-17 MED ORDER — SODIUM CHLORIDE 0.9 % IV BOLUS (SEPSIS)
500.0000 mL | Freq: Once | INTRAVENOUS | Status: AC
  Administered 2013-01-17: 500 mL via INTRAVENOUS

## 2013-01-17 NOTE — ED Notes (Signed)
Bed: WU98 Expected date:  Expected time:  Means of arrival:  Comments: EMS-palpations

## 2013-01-17 NOTE — ED Notes (Signed)
PT stated to EMS that she is having tingling all over and not feeling well. PT was at [facility] med center. Worked up and was negative. Elevated CK likely related to hand surgery according to EMS. Asked EMS to have her on the monitor.  C.o tingling all over, weakness, and palpitation.  O2- 2 L No nitro, no ASA from EMS.  Bp- 162/92 Hr- 60  RR- 22  Hx-. SVT, Hyperlipidemia, hypothyroid

## 2013-01-17 NOTE — ED Provider Notes (Addendum)
CSN: [id number]     Arrival date & time 01/17/13  1322 History   First MD Initiated Contact with Patient 01/17/13 1402     Chief Complaint  Patient presents with  . Palpitations   (Consider location/radiation/quality/duration/timing/severity/associated sxs/prior Treatment) HPI Comments: [name] is a 62 y.o. female who presents for evaluation of episodes of rapid heartbeat, shortness of breath, numbness; for several days. The episodes are occurring more frequently. They last anywhere from a few minutes to a few hours. She was evaluated for the same problem 2 days ago at an outlying urgent care. She denies recent fever, chills, nausea, vomiting, or weakness. She left wrist carpal tunnel surgery about one week ago. She states that on prior surgeries she has had SVT, related to anesthesia. She is taking her metoprolol, as usual, without relief. She has not had a problem eating. There've been no falls. There've been no loss of consciousness. No other known modifying factors.   Patient is a 62 y.o. female presenting with palpitations. The history is provided by the patient.  Palpitations   Past Medical History  Diagnosis Date  . Hypothyroidism   . Hyperlipidemia   . SVT (supraventricular tachycardia)   . GERD (gastroesophageal reflux disease)    Past Surgical History  Procedure Laterality Date  . Cesarean section  1987  . Breast surgery  1976    CYST REMOVED  . Hysteroscopy  9.14.2012    w/myomectomy   Family History  Problem Relation Age of Onset  . Hypertension Mother   . Heart disease Mother   . Mitral valve prolapse Mother   . Hypertension Father   . Heart disease Father   . Colon polyps Father   . Hypertension Brother   . Mitral valve prolapse Brother    History  Substance Use Topics  . Smoking status: Never Smoker   . Smokeless tobacco: Never Used  . Alcohol Use: Yes     Comment: rare   OB History   Grav Para Term Preterm Abortions TAB SAB Ect Mult  Living   1 1 1       1      Review of Systems  Cardiovascular: Positive for palpitations.  All other systems reviewed and are negative.    Allergies  Lidocaine-epinephrine  Home Medications   Current Outpatient Rx  Name  Route  Sig  Dispense  Refill  . carboxymethylcellulose (REFRESH) 1 % ophthalmic solution   Both Eyes   Place 1 drop into both eyes 2 (two) times daily as needed. irritation          . Cholecalciferol (VITAMIN D3) 3000 UNITS TABS   Oral   Take 1 tablet by mouth daily.           . fish oil-omega-3 fatty acids 1000 MG capsule   Oral   Take 1 g by mouth daily.         [name] ibuprofen (ADVIL,MOTRIN) 200 MG tablet   Oral   Take 400 mg by mouth every 8 (eight) hours as needed for pain.          [name] levothyroxine (SYNTHROID, LEVOTHROID) 75 MCG tablet   Oral   Take 75 mcg by mouth daily.           . metoprolol succinate (TOPROL-XL) 25 MG 24 hr tablet   Oral   Take 12.5 mg by mouth at bedtime.           . Multiple Vitamin (MULTIVITAMIN WITH MINERALS) TABS tablet  Oral   Take 1 tablet by mouth daily.          BP 137/76  Pulse 71  Temp(Src) 97.4 F (36.3 C) (Oral)  Resp 16  SpO2 100%  LMP 11/26/2000 Physical Exam  Nursing note and vitals reviewed. Constitutional: She is oriented to person, place, and time. She appears well-developed and well-nourished.  HENT:  Head: Normocephalic and atraumatic.  Eyes: Conjunctivae and EOM are normal. Pupils are equal, round, and reactive to light.  Neck: Normal range of motion and phonation normal. Neck supple.  Cardiovascular: Normal rate, regular rhythm and intact distal pulses.   Pulmonary/Chest: Effort normal and breath sounds normal. She exhibits no tenderness.  Abdominal: Soft. She exhibits no distension. There is no tenderness. There is no guarding.  Musculoskeletal: Normal range of motion.  Splint on left wrist. Fingers, left hand have normal capillary refill and sensation.  Neurological: She is  alert and oriented to person, place, and time. She exhibits normal muscle tone.  Skin: Skin is warm and dry.  Psychiatric: She has a normal mood and affect. Her behavior is normal. Judgment and thought content normal.    ED Course  Procedures (including critical care time) Medications  0.9 %  sodium chloride infusion (not administered)  sodium chloride 0.9 % bolus 500 mL (0 mLs Intravenous Stopped 01/17/13 1612)  iohexol (OMNIPAQUE) 350 MG/ML injection 100 mL (100 mLs Intravenous Contrast Given 01/17/13 1516)     Date: 01/17/13  Rate: 75  Rhythm: normal sinus rhythm  QRS Axis: normal  PR and QT Intervals: normal  ST/T Wave abnormalities: normal  PR and QRS Conduction Disutrbances:none  Narrative Interpretation:   Old EKG Reviewed: unchanged- 01/04/11    Labs Review Labs Reviewed  BASIC METABOLIC PANEL - Abnormal; Notable for the following:    Glucose, Bld 116 (*)    GFR calc non Af Amer 76 (*)    GFR calc Af Amer 88 (*)    All other components within normal limits  URINALYSIS, ROUTINE W REFLEX MICROSCOPIC - Abnormal; Notable for the following:    Hgb urine dipstick TRACE (*)    All other components within normal limits  URINE CULTURE  CBC WITH DIFFERENTIAL  TROPONIN I  URINE MICROSCOPIC-ADD ON   Imaging Review Ct Angio Chest Pe W/cm &/or Wo Cm  01/17/2013   CLINICAL DATA:  Initial encounter for generalized weakness and palpitations.  EXAM: CT ANGIOGRAPHY CHEST WITH CONTRAST  TECHNIQUE: Multidetector CT imaging of the chest was performed using the standard protocol during bolus administration of intravenous contrast. Multiplanar CT image reconstructions including MIPs were obtained to evaluate the vascular anatomy.  CONTRAST:  OMNIPAQUE IOHEXOL 350 MG/ML IV.  COMPARISON:  CTA chest 03/10/2010.  FINDINGS: Contrast opacification of the pulmonary arteries is fair. Respiratory motion blurred many of the images, and there is streak artifact from the high attenuation contrast in  the SVC. Therefore,, the study is of moderate diagnostic quality. No filling defects within either main pulmonary artery or their branches to suggest pulmonary embolism. Heart size upper normal. Small pericardial effusion localizing to the superior recess. Minimal atherosclerosis involving the thoracic and upper abdominal aorta. Direct origin of the left vertebral artery from the aortic arch. No visible coronary atherosclerosis.  Expected dependent atelectasis posteriorly in the lower lobes. Mild linear scarring in the left lower lobe. Subpleural nodule deep in the right costophrenic sulcus, statistically a small subpleural lymph node (series 7, image 54). Similar subpleural nodule laterally in the right lower lobe (  image 43). No suspicious pulmonary parenchymal nodules. No confluent airspace consolidation. No evidence of interstitial lung disease. No pleural effusions. Central airways patent without significant bronchial wall thickening.  No significant mediastinal, hilar, or axillary lymphadenopathy. Visualized thyroid gland unremarkable.  Visualized upper abdomen unremarkable. Bone window images unremarkable.  Review of the MIP images confirms the above findings.  IMPRESSION: 1. No evidence of pulmonary embolism. 2. No acute cardiopulmonary disease. Expected dependent atelectasis posteriorly in the lower lobes. 3. Subpleural nodules in the lateral right lower lobe and deep in the posterior right lower lobe consistent with small subpleural nodes. No significant pulmonary parenchymal nodule is identified. 4. Small pericardial effusion.   Electronically Signed   By: Hulan Saas   On: 01/17/2013 15:54    MDM   1. Palpitation    Palpitations without an ultrasound evaluation. She is stable for discharge with outpatient management. Significant cardiac arrhythmia, occult infection, PE, or metabolic instability.  Nursing Notes Reviewed/ Care Coordinated, and agree without changes. Applicable Imaging  Reviewed.  Interpretation of Laboratory Data incorporated into ED treatment   Plan: Home Medications- usual; Home Treatments and Observation- rest; return here if the recommended treatment, does not improve the symptoms; Recommended follow up- PCP, when necessary.  Consider Holter monitoring.      Flint Melter, MD 01/17/13 1626  Flint Melter, MD 01/17/13 [phone]

## 2013-01-19 LAB — URINE CULTURE
Colony Count: NO GROWTH
Culture: NO GROWTH

## 2013-01-20 ENCOUNTER — Encounter: Payer: Self-pay | Admitting: Cardiology

## 2013-01-20 ENCOUNTER — Ambulatory Visit (INDEPENDENT_AMBULATORY_CARE_PROVIDER_SITE_OTHER): Payer: BC Managed Care – PPO | Admitting: Cardiology

## 2013-01-20 VITALS — BP 144/92 | HR 68 | Ht 63.0 in | Wt 172.0 lb

## 2013-01-20 DIAGNOSIS — I119 Hypertensive heart disease without heart failure: Secondary | ICD-10-CM

## 2013-01-20 DIAGNOSIS — I471 Supraventricular tachycardia, unspecified: Secondary | ICD-10-CM

## 2013-01-20 NOTE — Progress Notes (Signed)
[name] [name] Date of Birth:  July 06, 1950 [facility] [address] [place], [place]  [address] [phone]         Fax   [phone]  History of Present Illness: This 63 year old woman is seen at the request of Dr. Selena Batten.  She is being seen for evaluation of tachycardia.  She gives an interesting cardiac history.  She has had a long history of paroxysmal supraventricular tachycardia.  She states that she had an ablation for SVT at [facility] in 2001.  Since then she has had infrequent episodes of SVT.  Many of her episodes have occurred following surgical procedures.  Last week she underwent left arm carpal tunnel surgery with tendon transfer at the outpatient [facility] surgical [facility] by Dr. Mina Marble.  She was monitored during the operation and apparently had no arrhythmia.  However 48 hours later she had onset of SVT and had episodes Friday evening, Sunday evening, and Tuesday morning.  She brought in EKGs for me to review but these were obtained after the SVT had broken.  The patient is familiar with trying forms of the Valsalva maneuver at home.  Interestingly, she has been on Toprol 12.5 mg chronically but for the week leading up to her hand surgery she took a larger dose of 25 mg daily.  Following surgery she went back to the smaller dose and it was during that period of time that she had the breakthrough arrhythmias.  She is not on any other blood pressure medicines other than Toprol 12.5 mg daily.  She recently was found to be mildly hypokalemic and had recent supplementation of potassium.  At her recent emergency room visit she had a CT angiogram of the chest which was negative for pulmonary emboli. Additional medical concerns of hers includes chronic right upper quadrant discomfort and she is followed for this by Dr. Evette Cristal.  A recent ultrasound apparently showed distended gallbladder but no stones and her recent liver function studies were  normal. The patient has been experiencing mild diastolic hypertension.  She states that the blood pressure has been all over the map.  Current Outpatient Prescriptions  Medication Sig Dispense Refill  . carboxymethylcellulose (REFRESH) 1 % ophthalmic solution Place 1 drop into both eyes 2 (two) times daily as needed. irritation       . fish oil-omega-3 fatty acids 1000 MG capsule Take 1 g by mouth daily.      [name] ibuprofen (ADVIL,MOTRIN) 200 MG tablet Take 400 mg by mouth every 8 (eight) hours as needed for pain.       [name] levothyroxine (SYNTHROID, LEVOTHROID) 75 MCG tablet Take 75 mcg by mouth daily.        . metoprolol succinate (TOPROL-XL) 25 MG 24 hr tablet Take 25 mg by mouth at bedtime.       . Multiple Vitamin (MULTIVITAMIN WITH MINERALS) TABS tablet Take 1 tablet by mouth daily.      [name] oxyCODONE-acetaminophen (PERCOCET/ROXICET) 5-325 MG per tablet Prn      . Cholecalciferol (VITAMIN D3) 3000 UNITS TABS Take 1 tablet by mouth daily.         No current facility-administered medications for this visit.    Allergies  Allergen Reactions  . Lipitor [Atorvastatin]     Muscle aches  . Lidocaine-Epinephrine [Lidocaine-Epinephrine] Palpitations    Patient Active Problem List   Diagnosis Date Noted  . Hypothyroidism 04/27/2012  . Hyperlipidemia 04/27/2012  . SVT (supraventricular tachycardia) 04/27/2012  . Endometrial polyp 12/04/2010  .  Fibroid 12/04/2010  . Fibroid, uterus 11/27/2010    History  Smoking status  . Never Smoker   Smokeless tobacco  . Never Used    History  Alcohol Use  . Yes    Comment: rare    Family History  Problem Relation Age of Onset  . Hypertension Mother   . Heart disease Mother   . Mitral valve prolapse Mother   . Hypertension Father   . Heart disease Father   . Colon polyps Father   . Hypertension Brother   . Mitral valve prolapse Brother     Review of Systems: Constitutional: no fever chills diaphoresis or fatigue or change in weight.   Head and neck: no hearing loss, no epistaxis, no photophobia or visual disturbance. Respiratory: No cough, shortness of breath or wheezing. Cardiovascular: No chest pain peripheral edema, positive for palpitations with sudden onset and offset Gastrointestinal: No abdominal distention, no abdominal pain, she has had some recent loose stools and increasing intestinal gas which she attributes to possible gallbladder problems Genitourinary: No dysuria, no frequency, no urgency, no nocturia. Musculoskeletal:No arthralgias, no back pain, no gait disturbance or myalgias. Neurological: No dizziness, no headaches, no numbness, no seizures, no syncope, no weakness, no tremors. Hematologic: No lymphadenopathy, no easy bruising. Psychiatric: No confusion, no hallucinations, no sleep disturbance.    Physical Exam: Filed Vitals:   01/20/13 1549  BP: 144/92  Pulse: 68   the general appearance reveals a well-developed well-nourished alert woman in no acute distress.The head and neck exam reveals pupils equal and reactive.  Extraocular movements are full.  There is no scleral icterus.  The mouth and pharynx are normal.  The neck is supple.  The carotids reveal no bruits.  The jugular venous pressure is normal.  The  thyroid is not enlarged.  There is no lymphadenopathy.  The chest is clear to percussion and auscultation.  There are no rales or rhonchi.  Expansion of the chest is symmetrical.  The precordium is quiet.  The first heart sound is normal.  The second heart sound is physiologically split.  There is no murmur gallop rub or click.  There is no abnormal lift or heave.  The abdomen is soft and nontender.  The bowel sounds are normal.  The liver and spleen are not enlarged.  There are no abdominal masses.  There are no abdominal bruits.  Extremities reveal good pedal pulses.  There is no phlebitis or edema.  There is no cyanosis or clubbing.  Strength is normal and symmetrical in all extremities.  There is  no lateralizing weakness.  There are no sensory deficits.  The skin is warm and dry.  There is no rash.  Recent EKGs were reviewed and show normal sinus rhythm and no evidence of preexcitation.   Assessment / Plan: We will have the patient return for an echocardiogram to evaluate her PSVT further. We will have her increase her Toprol dose up to 25 mg daily to help with blood pressure and with suppression of PSVT. We will plan to see her back in one month for followup office visit. Meanwhile she will also be seeing Dr. Evette Cristal regarding question of gallbladder problem

## 2013-01-20 NOTE — Patient Instructions (Signed)
INCREASE YOUR TOPROL TO 25 MG DAILY  Your physician has requested that you have an echocardiogram. Echocardiography is a painless test that uses sound waves to create images of your heart. It provides your doctor with information about the size and shape of your heart and how well your heart's chambers and valves are working. This procedure takes approximately one hour. There are no restrictions for this procedure.  Your physician recommends that you schedule a follow-up appointment in: 1  Month ov

## 2013-01-21 ENCOUNTER — Other Ambulatory Visit (HOSPITAL_COMMUNITY): Payer: Self-pay | Admitting: Gastroenterology

## 2013-01-21 DIAGNOSIS — R1011 Right upper quadrant pain: Secondary | ICD-10-CM

## 2013-01-28 ENCOUNTER — Telehealth: Payer: Self-pay | Admitting: Cardiology

## 2013-01-28 ENCOUNTER — Emergency Department: Admission: EM | Admit: 2013-01-28 | Discharge: 2013-01-28 | Payer: BC Managed Care – PPO | Source: Home / Self Care

## 2013-01-28 ENCOUNTER — Telehealth: Payer: Self-pay | Admitting: Physician Assistant

## 2013-01-28 NOTE — Telephone Encounter (Signed)
New problem. 

## 2013-01-28 NOTE — Telephone Encounter (Signed)
New problem     Having ergular beats and hold body weakness.  Wanted to move up Echo.    Pt would like a call back has some questions.

## 2013-01-28 NOTE — Telephone Encounter (Signed)
Left message to call back  

## 2013-01-28 NOTE — Telephone Encounter (Signed)
Follow up    Pt returned RN's call

## 2013-01-28 NOTE — Telephone Encounter (Signed)
RN at [facility] called because pt had come there this evening complaining of weakness and general malaise. She also had some palpitations and chest pain.   The RN stated pt described going to [facility] hospital twice in the past week and had been to [facility] ER 9/28. She saw Dr. Patty Sermons on 10/1 for these symptoms.   An echocardiogram is ordered. She has a history of PAF and hypothyroid but no TSH is seen in recent labs and the staff at the Urgent Care had not checked VS.   I advised the RN that I had no data on which to base a decision. Suggested that the patient should be evaluated by someone, and consider orthostatic VS be considered.   RN stated she would discuss all this with the patient.

## 2013-01-29 ENCOUNTER — Encounter: Payer: Self-pay | Admitting: Radiology

## 2013-01-29 ENCOUNTER — Encounter (INDEPENDENT_AMBULATORY_CARE_PROVIDER_SITE_OTHER): Payer: BC Managed Care – PPO

## 2013-01-29 ENCOUNTER — Telehealth: Payer: Self-pay | Admitting: Cardiology

## 2013-01-29 DIAGNOSIS — I498 Other specified cardiac arrhythmias: Secondary | ICD-10-CM

## 2013-01-29 DIAGNOSIS — I471 Supraventricular tachycardia: Secondary | ICD-10-CM

## 2013-01-29 NOTE — Telephone Encounter (Signed)
This should be directed to Dr. Patty Sermons

## 2013-01-29 NOTE — Telephone Encounter (Signed)
Follow Up   Pt returned RN's call and would like a call back please.   10/9 @ 4:32 pm.        This message was routed wrong.  I do apologize.     '   Thanks!

## 2013-01-29 NOTE — Telephone Encounter (Signed)
Spoke with patient and she is feeling better today. States she had episode of irregular heartbeat yesterday that lasted most of the day along with lightheadedness, and weakness. Denies any chest pains, some shortness of breath. Discussed with Dawayne Patricia NP, will have patient come in for a 30 day event monitor. Advised patient. Keep ov with Dr. Patty Sermons in November

## 2013-01-29 NOTE — Progress Notes (Signed)
Patient ID: [name], female   DOB: April 28, 1950, 62 y.o.   MRN: [id number] E Cardio Braemer 30 day monitor applied

## 2013-01-29 NOTE — Telephone Encounter (Signed)
Spoke with patient and she is feeling better today. States she had episode of irregular heartbeat yesterday that lasted most of the day along with lightheadedness, and weakness. Denies any chest pains, some shortness of breath. Discussed with Lori G NP, will have patient come in for a 30 day event monitor. Advised patient. Keep ov with Dr. Brackbill in November  

## 2013-02-02 ENCOUNTER — Ambulatory Visit (HOSPITAL_COMMUNITY): Payer: BC Managed Care – PPO | Attending: Cardiovascular Disease | Admitting: Cardiology

## 2013-02-02 DIAGNOSIS — I471 Supraventricular tachycardia, unspecified: Secondary | ICD-10-CM | POA: Insufficient documentation

## 2013-02-02 DIAGNOSIS — I10 Essential (primary) hypertension: Secondary | ICD-10-CM | POA: Insufficient documentation

## 2013-02-02 DIAGNOSIS — F17200 Nicotine dependence, unspecified, uncomplicated: Secondary | ICD-10-CM | POA: Insufficient documentation

## 2013-02-02 NOTE — Progress Notes (Signed)
Echo performed. 

## 2013-02-04 ENCOUNTER — Encounter (HOSPITAL_COMMUNITY)
Admission: RE | Admit: 2013-02-04 | Discharge: 2013-02-04 | Disposition: A | Discharge status: Home / Self Care | Payer: BC Managed Care – PPO | Source: Ambulatory Visit | Attending: Gastroenterology | Admitting: Gastroenterology

## 2013-02-04 DIAGNOSIS — R1011 Right upper quadrant pain: Secondary | ICD-10-CM | POA: Insufficient documentation

## 2013-02-04 MED ORDER — TECHNETIUM TC 99M MEBROFENIN IV KIT
5.0000 | PACK | Freq: Once | INTRAVENOUS | Status: AC | PRN
  Administered 2013-02-04: 5 via INTRAVENOUS

## 2013-02-04 MED ORDER — SINCALIDE 5 MCG IJ SOLR
0.0200 ug/kg | Freq: Once | INTRAMUSCULAR | Status: AC
  Administered 2013-02-04: 1.55 ug via INTRAVENOUS

## 2013-02-04 MED ORDER — SINCALIDE 5 MCG IJ SOLR
INTRAMUSCULAR | Status: AC
  Filled 2013-02-04: qty 5

## 2013-02-05 NOTE — Telephone Encounter (Signed)
New Problem  Pt calling for ECHO results. Please call

## 2013-02-05 NOTE — Telephone Encounter (Signed)
Advised patient   Notes Recorded by Cassell Clement, MD on 02/02/2013 at 8:11 PM Echo is normal. Please report.

## 2013-02-09 ENCOUNTER — Other Ambulatory Visit: Payer: Self-pay | Admitting: *Deleted

## 2013-02-09 MED ORDER — METOPROLOL SUCCINATE ER 25 MG PO TB24: 25.0000 mg | ORAL_TABLET | Freq: Every day | ORAL | Status: DC

## 2013-02-10 ENCOUNTER — Other Ambulatory Visit: Payer: Self-pay | Admitting: *Deleted

## 2013-02-10 MED ORDER — METOPROLOL SUCCINATE ER 25 MG PO TB24: 25.0000 mg | ORAL_TABLET | Freq: Every day | ORAL | Status: DC

## 2013-02-19 ENCOUNTER — Ambulatory Visit (INDEPENDENT_AMBULATORY_CARE_PROVIDER_SITE_OTHER): Payer: BC Managed Care – PPO | Admitting: Cardiology

## 2013-02-19 ENCOUNTER — Encounter: Payer: Self-pay | Admitting: Cardiology

## 2013-02-19 VITALS — BP 140/94 | HR 64 | Ht 63.0 in | Wt 169.1 lb

## 2013-02-19 DIAGNOSIS — I471 Supraventricular tachycardia: Secondary | ICD-10-CM

## 2013-02-19 DIAGNOSIS — E039 Hypothyroidism, unspecified: Secondary | ICD-10-CM

## 2013-02-19 DIAGNOSIS — I498 Other specified cardiac arrhythmias: Secondary | ICD-10-CM

## 2013-02-19 DIAGNOSIS — I119 Hypertensive heart disease without heart failure: Secondary | ICD-10-CM

## 2013-02-19 MED ORDER — LOSARTAN POTASSIUM 25 MG PO TABS: 25.0000 mg | ORAL_TABLET | Freq: Every day | ORAL | Status: DC

## 2013-02-19 NOTE — Progress Notes (Signed)
[name] [name] Date of Birth:  [birth year] [facility] [address] [place], [place]  [address] [phone]         Fax   [phone]  History of Present Illness: This 62 year old woman was originally seen at the request of Dr. Selena Batten.  She returns now for a work in the office visit.  She has a past history of essential hypertension and a history of palpitations and tachycardia. She is being seen for evaluation of tachycardia.  She gives an interesting cardiac history.  She has had a long history of paroxysmal supraventricular tachycardia.  She states that she had an ablation for SVT at [facility] in 2001.  Since then she has had infrequent episodes of SVT.  Many of her episodes have occurred following surgical procedures.  Last week she underwent left arm carpal tunnel surgery with tendon transfer at the outpatient [facility]opedic surgical [facility] by Dr. Mina Marble.  She was monitored during the operation and apparently had no arrhythmia.  However 48 hours later she had onset of SVT and had episodes Friday evening, Sunday evening, and Tuesday morning.  She brought in EKGs for me to review but these were obtained after the SVT had broken.  The patient is familiar with trying forms of the Valsalva maneuver at home.  Interestingly, she has been on Toprol 12.5 mg chronically but for the week leading up to her hand surgery she took a larger dose of 25 mg daily.  Following surgery she went back to the smaller dose and it was during that period of time that she had the breakthrough arrhythmias.  She is not on any other blood pressure medicines other than Toprol 25 mg daily.  She recently was found to be mildly hypokalemic and had recent supplementation of potassium.  At her recent emergency room visit she had a CT angiogram of the chest which was negative for pulmonary emboli. Additional medical concerns of hers includes chronic right upper quadrant discomfort and she is  followed for this by Dr. Evette Cristal.  A recent ultrasound apparently showed distended gallbladder but no stones and her recent liver function studies were normal. Since her last office visit here she has had an echocardiogram on 02/02/13 which showed normal left ventricular function with ejection fraction 55-60% and no significant abnormalities. The patient has been wearing a 30 day event monitor.  So far we have recordings for the first 21 days and she has had no significant arrhythmias documented although she states that she has continued to have episodes of palpitations.   Current Outpatient Prescriptions  Medication Sig Dispense Refill  . Cholecalciferol (VITAMIN D3) 3000 UNITS TABS Take 1 tablet by mouth daily.        . fish oil-omega-3 fatty acids 1000 MG capsule Take 1 g by mouth daily.      [name] ibuprofen (ADVIL,MOTRIN) 200 MG tablet Take 400 mg by mouth every 8 (eight) hours as needed for pain.       [name] levothyroxine (SYNTHROID, LEVOTHROID) 75 MCG tablet Take 75 mcg by mouth daily.        . metoprolol succinate (TOPROL-XL) 25 MG 24 hr tablet Take 1 tablet (25 mg total) by mouth at bedtime.  90 tablet  0  . Multiple Vitamin (MULTIVITAMIN WITH MINERALS) TABS tablet Take 1 tablet by mouth daily.      [name] losartan (COZAAR) 25 MG tablet Take 1 tablet (25 mg total) by mouth daily.  30 tablet  5  No current facility-administered medications for this visit.    Allergies  Allergen Reactions  . Lipitor [Atorvastatin]     Muscle aches  . Lidocaine-Epinephrine [Lidocaine-Epinephrine] Palpitations    Patient Active Problem List   Diagnosis Date Noted  . 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 Hypothyroidism 04/27/2012  . Hyperlipidemia 04/27/2012  . SVT (supraventricular tachycardia) 04/27/2012  . Endometrial polyp 12/04/2010  . Fibroid 12/04/2010  . Fibroid, uterus 11/27/2010    History  Smoking status  . Never Smoker   Smokeless tobacco  . Never Used     History  Alcohol Use  . Yes    Comment: rare    Family History  Problem Relation Age of Onset  . Hypertension Mother   . Heart disease Mother   . Mitral valve prolapse Mother   . Hypertension Father   . Heart disease Father   . Colon polyps Father   . Hypertension Brother   . Mitral valve prolapse Brother     Review of Systems: Constitutional: no fever chills diaphoresis or fatigue or change in weight.  Head and neck: no hearing loss, no epistaxis, no photophobia or visual disturbance. Respiratory: No cough, shortness of breath or wheezing. Cardiovascular: No chest pain peripheral edema, positive for palpitations with sudden onset and offset Gastrointestinal: No abdominal distention, no abdominal pain, she has had some recent loose stools and increasing intestinal gas which she attributes to possible gallbladder problems Genitourinary: No dysuria, no frequency, no urgency, no nocturia. Musculoskeletal:No arthralgias, no back pain, no gait disturbance or myalgias. Neurological: No dizziness, no headaches, no numbness, no seizures, no syncope, no weakness, no tremors. Hematologic: No lymphadenopathy, no easy bruising. Psychiatric: No confusion, no hallucinations, no sleep disturbance.    Physical Exam: Filed Vitals:   02/19/13 1138  BP: 140/94  Pulse: 64   the general appearance reveals a well-developed well-nourished alert woman in no acute distress.The head and neck exam reveals pupils equal and reactive.  Extraocular movements are full.  There is no scleral icterus.  The mouth and pharynx are normal.  The neck is supple.  The carotids reveal no bruits.  The jugular venous pressure is normal.  The  thyroid is not enlarged.  There is no lymphadenopathy.  The chest is clear to percussion and auscultation.  There are no rales or rhonchi.  Expansion of the chest is symmetrical.  The precordium is quiet.  The first heart sound is normal.  The second heart sound is  physiologically split.  There is no murmur gallop rub or click.  There is no abnormal lift or heave.  The abdomen is soft and nontender.  The bowel sounds are normal.  The liver and spleen are not enlarged.  There are no abdominal masses.  There are no abdominal bruits.  Extremities reveal good pedal pulses.  There is no phlebitis or edema.  There is no cyanosis or clubbing.  Strength is normal and symmetrical in all extremities.  There is no lateralizing weakness.  There are no sensory deficits.  The skin is warm and dry.  There is no rash.  EKG shows normal sinus rhythm and is within normal limits   Assessment / Plan: Continue beta blocker Toprol 25 mg daily for blood pressure and add losartan 25 mg daily. Keep followup appointment with Dr. Durward Mallard for next week. Return here in 3-4 weeks for followup office visit and basal metabolic panel. Continued to finish out wearing the event monitor for the remaining  9 days.  As noted, so far no significant arrhythmias have been noted. She mentioned that Dr. Uvaldo Rising had given her something for nerves but that she has not taken it.  The patient does appear to be tense and I suggested that she try taking the medication.

## 2013-02-19 NOTE — Patient Instructions (Signed)
START LOSARTAN 25 MG DAILY, RX SENT TO [facility]   Your physician recommends that you schedule a follow-up appointment in: 3-4 WEEKS OV/BMET

## 2013-02-19 NOTE — Assessment & Plan Note (Signed)
The patient has not had any documented SVT since wearing the event monitor.  She had another spell clinically this morning and called EMS.  When they arrived she apparently was okay.  We do not have any recordings back yet from earlier today.

## 2013-02-19 NOTE — Assessment & Plan Note (Signed)
The patient is complaining of dizziness.  She states this is the way she feels when her blood pressure is elevated.  I rechecked her blood pressure and got 140/94 in the left arm.  She is already on beta blocker and we will add losartan 25 mg one daily.  She has normal renal function.

## 2013-02-19 NOTE — Telephone Encounter (Signed)
Pt called because she has been having palpitations on and off. Yesterday she felt her palpitations and she was  weakness all day. Pt could not sleep all night last night, finally when she finally  felt asleep at 5:45 AM she got waken up with a fast rate palpitations and SOB,  Pt was light headed then. Pt called paramedics, and  when they got there her heart hate has down to 90 to 100 beats/minute. Pt would like to bee seen today.

## 2013-02-19 NOTE — Telephone Encounter (Signed)
Patient coming for ov this am

## 2013-02-19 NOTE — Assessment & Plan Note (Signed)
The patient is clinically euthyroid on current Synthroid replacement dosage.  Her thyroid is followed by Dr. Selena Batten, her PCP

## 2013-02-19 NOTE — Telephone Encounter (Signed)
New Problem   Pt states that She has a transmitter that has sent a signal 3 times for palpitations and shortness of breath and she asks if she can get a same day appt// please assist

## 2013-02-22 ENCOUNTER — Ambulatory Visit: Payer: BC Managed Care – PPO | Admitting: Cardiology

## 2013-02-25 ENCOUNTER — Other Ambulatory Visit: Payer: Self-pay

## 2013-03-05 ENCOUNTER — Emergency Department (HOSPITAL_COMMUNITY)
Admission: EM | Admit: 2013-03-05 | Discharge: 2013-03-05 | Disposition: A | Discharge status: Home / Self Care | Payer: BC Managed Care – PPO | Source: Home / Self Care

## 2013-03-08 ENCOUNTER — Telehealth: Payer: Self-pay | Admitting: Cardiology

## 2013-03-08 NOTE — Telephone Encounter (Signed)
Spoke with patient who is questioning why she has repeat lab work ordered for 12/5.  I advised patient that this is a recheck of her previous BMET to evaluate how her body is handling medical treatment prescribed by Dr. Patty Sermons.  Patient verbalized understanding and agreement.

## 2013-03-08 NOTE — Telephone Encounter (Signed)
New Problem  Pt requests a call back to determine which lab is needed// please call

## 2013-03-09 ENCOUNTER — Other Ambulatory Visit: Payer: BC Managed Care – PPO

## 2013-03-09 ENCOUNTER — Ambulatory Visit: Payer: BC Managed Care – PPO | Admitting: Cardiology

## 2013-03-26 ENCOUNTER — Other Ambulatory Visit: Payer: BC Managed Care – PPO

## 2013-03-26 ENCOUNTER — Ambulatory Visit: Payer: BC Managed Care – PPO | Admitting: Cardiology

## 2013-03-29 ENCOUNTER — Ambulatory Visit (INDEPENDENT_AMBULATORY_CARE_PROVIDER_SITE_OTHER): Payer: BC Managed Care – PPO | Admitting: Cardiology

## 2013-03-29 ENCOUNTER — Other Ambulatory Visit: Payer: BC Managed Care – PPO

## 2013-03-29 ENCOUNTER — Encounter: Payer: Self-pay | Admitting: Cardiology

## 2013-03-29 VITALS — BP 115/80 | HR 68 | Ht 63.0 in | Wt 164.0 lb

## 2013-03-29 DIAGNOSIS — I471 Supraventricular tachycardia: Secondary | ICD-10-CM

## 2013-03-29 DIAGNOSIS — E069 Thyroiditis, unspecified: Secondary | ICD-10-CM

## 2013-03-29 DIAGNOSIS — I498 Other specified cardiac arrhythmias: Secondary | ICD-10-CM

## 2013-03-29 DIAGNOSIS — E063 Autoimmune thyroiditis: Secondary | ICD-10-CM | POA: Insufficient documentation

## 2013-03-29 DIAGNOSIS — I119 Hypertensive heart disease without heart failure: Secondary | ICD-10-CM

## 2013-03-29 NOTE — Assessment & Plan Note (Signed)
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  She is now on Armour thyroid which supplements but her T3 and T4. Overall she has felt much better.  She is also having to be on large doses of vitamin D 10,000 units a day because of persistent low vitamin D levels.

## 2013-03-29 NOTE — Progress Notes (Signed)
[name] Date of Birth:  [birth year] [address] [place], [place]  [address] [phone]         Fax   [phone]  History of Present Illness: This 62 year old woman was originally seen at the request of Dr. Selena Batten.  She returns now for a scheduled office visit.  She has a past history of essential hypertension and a history of palpitations and tachycardia. She is being seen for evaluation of tachycardia.  She gives an interesting cardiac history.  She has had a long history of paroxysmal supraventricular tachycardia.  She states that she had an ablation for SVT at [facility] in 2001.  Since then she has had infrequent episodes of SVT.  Many of her episodes have occurred following surgical procedures.  Last October she underwent left arm carpal tunnel surgery with tendon transfer at the outpatient orthopedic surgical Center by Dr. Mina Marble.  She was monitored during the operation and apparently had no arrhythmia.  However 48 hours later she had onset of SVT and had episodes Friday evening, Sunday evening, and Tuesday morning.  She brought in EKGs for me to review but these were obtained after the SVT had broken.  The patient is familiar with trying forms of the Valsalva maneuver at home.  Interestingly, she has been on Toprol 12.5 mg chronically but for the week leading up to her hand surgery she took a larger dose of 25 mg daily.  Following surgery she went back to the smaller dose and it was during that period of time that she had the breakthrough arrhythmias.  She is not on any other blood pressure medicines other than Toprol 25 mg daily.  She recently was found to be mildly hypokalemic and had recent supplementation of potassium.  At her recent emergency room visit she had a CT angiogram of the chest which was negative for pulmonary emboli. Additional medical concerns of hers includes chronic right upper quadrant discomfort and she is followed for this by  Dr. Evette Cristal.  A recent ultrasound apparently showed distended gallbladder but no stones and her recent liver function studies were normal. Since her last office visit here she has had an echocardiogram on 02/02/13 which showed normal left ventricular function with ejection fraction 55-60% and no significant abnormalities. The patient wore a 30 day event monitor in October 2014.  This showed only normal sinus rhythm and there were no episodes of SVT noted even though she thought that she had felt some episodes. Since we last saw her she has had additional lab work at her PCP which raises the possibility of Hashimoto's thyroiditis.  Current Outpatient Prescriptions  Medication Sig Dispense Refill  . Cholecalciferol (VITAMIN D3) 10000 UNITS capsule Take 10,000 Units by mouth daily.      . fish oil-omega-3 fatty acids 1000 MG capsule Take 1 g by mouth daily.      [name] losartan (COZAAR) 25 MG tablet Take 1 tablet (25 mg total) by mouth daily.  30 tablet  5  . metoprolol succinate (TOPROL-XL) 25 MG 24 hr tablet Take 1 tablet (25 mg total) by mouth at bedtime.  90 tablet  0  . Multiple Vitamin (MULTIVITAMIN WITH MINERALS) TABS tablet Take 1 tablet by mouth daily.      [name] thyroid (ARMOUR) 32.5 MG tablet 1 am 1/2 pm      . ibuprofen (ADVIL,MOTRIN) 200 MG tablet Take 400 mg by mouth every 8 (eight) hours as needed for pain.  No current facility-administered medications for this visit.    Allergies  Allergen Reactions  . Lipitor [Atorvastatin]     Muscle aches  . Lidocaine-Epinephrine [Lidocaine-Epinephrine] Palpitations    Patient Active Problem List   Diagnosis Date Noted  . Thyroiditis 03/29/2013  . 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 Hypothyroidism 04/27/2012  . Hyperlipidemia 04/27/2012  . SVT (supraventricular tachycardia) 04/27/2012  . Endometrial polyp 12/04/2010  . Fibroid 12/04/2010  . Fibroid, uterus 11/27/2010    History  Smoking status  .  Never Smoker   Smokeless tobacco  . Never Used    History  Alcohol Use  . Yes    Comment: rare    Family History  Problem Relation Age of Onset  . Hypertension Mother   . Heart disease Mother   . Mitral valve prolapse Mother   . Hypertension Father   . Heart disease Father   . Colon polyps Father   . Hypertension Brother   . Mitral valve prolapse Brother     Review of Systems: Constitutional: no fever chills diaphoresis or fatigue or change in weight.  Head and neck: no hearing loss, no epistaxis, no photophobia or visual disturbance. Respiratory: No cough, shortness of breath or wheezing. Cardiovascular: No chest pain peripheral edema, positive for palpitations with sudden onset and offset Gastrointestinal: No abdominal distention, no abdominal pain, she has had some recent loose stools and increasing intestinal gas which she attributes to possible gallbladder problems Genitourinary: No dysuria, no frequency, no urgency, no nocturia. Musculoskeletal:No arthralgias, no back pain, no gait disturbance or myalgias. Neurological: No dizziness, no headaches, no numbness, no seizures, no syncope, no weakness, no tremors. Hematologic: No lymphadenopathy, no easy bruising. Psychiatric: No confusion, no hallucinations, no sleep disturbance.    Physical Exam: Filed Vitals:   03/29/13 1127  BP: 115/80  Pulse: 68   the general appearance reveals a well-developed well-nourished alert woman in no acute distress.The head and neck exam reveals pupils equal and reactive.  Extraocular movements are full.  There is no scleral icterus.  The mouth and pharynx are normal.  The neck is supple.  The carotids reveal no bruits.  The jugular venous pressure is normal.  The  thyroid is not enlarged.  There is no lymphadenopathy.  The chest is clear to percussion and auscultation.  There are no rales or rhonchi.  Expansion of the chest is symmetrical.  The precordium is quiet.  The first heart sound is  normal.  The second heart sound is physiologically split.  There is no murmur gallop rub or click.  There is no abnormal lift or heave.  The abdomen is soft and nontender.  The bowel sounds are normal.  The liver and spleen are not enlarged.  There are no abdominal masses.  There are no abdominal bruits.  Extremities reveal good pedal pulses.  There is no phlebitis or edema.  There is no cyanosis or clubbing.  Strength is normal and symmetrical in all extremities.  There is no lateralizing weakness.  There are no sensory deficits.  The skin is warm and dry.  There is no rash   Assessment / Plan: The patient appears to be feeling much better. Continue beta blocker Toprol 25 mg daily and losartan 25 mg daily for blood pressure control. Continue close followup with Dr. Selena Batten Recheck here in 3 months for followup office visit

## 2013-03-29 NOTE — Patient Instructions (Signed)
Your physician recommends that you continue on your current medications as directed. Please refer to the Current Medication list given to you today.  Your physician recommends that you schedule a follow-up appointment in: 3 month ov 

## 2013-03-29 NOTE — Assessment & Plan Note (Signed)
At her last visit she was started on losartan 25 mg daily for blood pressure control.  She reports that her blood pressure has been stable since last visit.  She is tolerating the losartan.  She has had blood work since starting the losartan which showed that her potassium and renal function remained normal.  We did not have to repeat blood work today.

## 2013-03-29 NOTE — Assessment & Plan Note (Signed)
The patient is doing well on her current dose of Toprol XL 25 mg once a day.  She has not been experiencing any further episodes of perceived SVT or palpitations.  We will continue current dose of Toprol

## 2013-04-05 DIAGNOSIS — R490 Dysphonia: Secondary | ICD-10-CM | POA: Insufficient documentation

## 2013-04-05 DIAGNOSIS — K219 Gastro-esophageal reflux disease without esophagitis: Secondary | ICD-10-CM | POA: Insufficient documentation

## 2013-04-07 ENCOUNTER — Encounter (INDEPENDENT_AMBULATORY_CARE_PROVIDER_SITE_OTHER): Payer: BC Managed Care – PPO

## 2013-04-07 ENCOUNTER — Encounter: Payer: Self-pay | Admitting: *Deleted

## 2013-04-07 ENCOUNTER — Telehealth: Payer: Self-pay | Admitting: Cardiology

## 2013-04-07 DIAGNOSIS — I499 Cardiac arrhythmia, unspecified: Secondary | ICD-10-CM

## 2013-04-07 DIAGNOSIS — R002 Palpitations: Secondary | ICD-10-CM

## 2013-04-07 DIAGNOSIS — I498 Other specified cardiac arrhythmias: Secondary | ICD-10-CM

## 2013-04-07 NOTE — Telephone Encounter (Signed)
Left message to call back  

## 2013-04-07 NOTE — Telephone Encounter (Signed)
New message    Have change in heart rhythum. Want to talk to someone about this.

## 2013-04-07 NOTE — Telephone Encounter (Signed)
Patient states since yesterday she has been having irregular beat, feels like her heart pauses and then beats. States it is not like anything she has had before. Is happening frequently but not all the time where it may show up on an EKG. Discussed with Dawayne Patricia NP and will have patient come in for a 24 hour monitor. Advised patient and she will be here today at 4:15 for a 4:30

## 2013-04-07 NOTE — Progress Notes (Signed)
Patient ID: [name], female   DOB: Dec 11, 1950, 62 y.o.   MRN: [id number] E-Cardio 24 hour holter monitor applied to patient.

## 2013-04-20 ENCOUNTER — Telehealth: Payer: Self-pay | Admitting: *Deleted

## 2013-04-20 NOTE — Telephone Encounter (Signed)
Holter monitor results reviewed by  Dr. Patty Sermons - interpretation:  NSR occasional PVC's , no VT or malignant arrhythmia. Occasional SVT (no systems noted)  Advised patient of results

## 2013-04-30 ENCOUNTER — Encounter: Payer: Self-pay | Admitting: Women's Health

## 2013-04-30 ENCOUNTER — Ambulatory Visit (INDEPENDENT_AMBULATORY_CARE_PROVIDER_SITE_OTHER): Payer: BC Managed Care – PPO | Admitting: Women's Health

## 2013-04-30 VITALS — BP 116/74 | Ht 62.25 in | Wt 166.0 lb

## 2013-04-30 DIAGNOSIS — M949 Disorder of cartilage, unspecified: Secondary | ICD-10-CM

## 2013-04-30 DIAGNOSIS — M899 Disorder of bone, unspecified: Secondary | ICD-10-CM

## 2013-04-30 DIAGNOSIS — M8580 Other specified disorders of bone density and structure, unspecified site: Secondary | ICD-10-CM | POA: Insufficient documentation

## 2013-04-30 DIAGNOSIS — Z01419 Encounter for gynecological examination (general) (routine) without abnormal findings: Secondary | ICD-10-CM

## 2013-04-30 LAB — URINALYSIS W MICROSCOPIC + REFLEX CULTURE
Bilirubin Urine: NEGATIVE
Casts: NONE SEEN
Crystals: NONE SEEN
Glucose, UA: NEGATIVE mg/dL
Ketones, ur: NEGATIVE mg/dL
Nitrite: NEGATIVE
Protein, ur: NEGATIVE mg/dL
Specific Gravity, Urine: 1.024 (ref 1.005–1.030)
Urobilinogen, UA: 0.2 mg/dL (ref 0.0–1.0)
pH: 6 (ref 5.0–8.0)
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
## 2013-04-30 NOTE — Patient Instructions (Signed)
Health Recommendations for Postmenopausal Women Respected and ongoing research has looked at the most common causes of death, disability, and poor quality of life in postmenopausal women. The causes include heart disease, diseases of blood vessels, diabetes, depression, cancer, and bone loss (osteoporosis). Many things can be done to help lower the chances of developing these and other common problems: CARDIOVASCULAR DISEASE Heart Disease: A heart attack is a medical emergency. Know the signs and symptoms of a heart attack. Below are things women can do to reduce their risk for heart disease.   Do not smoke. If you smoke, quit.  Aim for a healthy weight. Being overweight causes many preventable deaths. Eat a healthy and balanced diet and drink an adequate amount of liquids.  Get moving. Make a commitment to be more physically active. Aim for 30 minutes of activity on most, if not all days of the week.  Eat for heart health. Choose a diet that is low in saturated fat and cholesterol and eliminate trans fat. Include whole grains, vegetables, and fruits. Read and understand the labels on food containers before buying.  Know your numbers. Ask your caregiver to check your blood pressure, cholesterol (total, HDL, LDL, triglycerides) and blood glucose. Work with your caregiver on improving your entire clinical picture.  High blood pressure. Limit or stop your table salt intake (try salt substitute and food seasonings). Avoid salty foods and drinks. Read labels on food containers before buying. Eating well and exercising can help control high blood pressure. STROKE  Stroke is a medical emergency. Stroke may be the result of a blood clot in a blood vessel in the brain or by a brain hemorrhage (bleeding). Know the signs and symptoms of a stroke. To lower the risk of developing a stroke:  Avoid fatty foods.  Quit smoking.  Control your diabetes, blood pressure, and irregular heart rate. THROMBOPHLEBITIS  (BLOOD CLOT) OF THE LEG  Becoming overweight and leading a stationary lifestyle may also contribute to developing blood clots. Controlling your diet and exercising will help lower the risk of developing blood clots. CANCER SCREENING  Breast Cancer: Take steps to reduce your risk of breast cancer.  You should practice "breast self-awareness." This means understanding the normal appearance and feel of your breasts and should include breast self-examination. Any changes detected, no matter how small, should be reported to your caregiver.  After age 40, you should have a clinical breast exam (CBE) every year.  Starting at age 40, you should consider having a mammogram (breast X-ray) every year.  If you have a family history of breast cancer, talk to your caregiver about genetic screening.  If you are at high risk for breast cancer, talk to your caregiver about having an MRI and a mammogram every year.  Intestinal or Stomach Cancer: Tests to consider are a rectal exam, fecal occult blood, sigmoidoscopy, and colonoscopy. Women who are high risk may need to be screened at an earlier age and more often.  Cervical Cancer:  Beginning at age 30, you should have a Pap test every 3 years as long as the past 3 Pap tests have been normal.  If you have had past treatment for cervical cancer or a condition that could lead to cancer, you need Pap tests and screening for cancer for at least 20 years after your treatment.  If you had a hysterectomy for a problem that was not cancer or a condition that could lead to cancer, then you no longer need Pap tests.    If you are between ages 65 and 70, and you have had normal Pap tests going back 10 years, you no longer need Pap tests.  If Pap tests have been discontinued, risk factors (such as a new sexual partner) need to be reassessed to determine if screening should be resumed.  Some medical problems can increase the chance of getting cervical cancer. In these  cases, your caregiver may recommend more frequent screening and Pap tests.  Uterine Cancer: If you have vaginal bleeding after reaching menopause, you should notify your caregiver.  Ovarian cancer: Other than yearly pelvic exams, there are no reliable tests available to screen for ovarian cancer at this time except for yearly pelvic exams.  Lung Cancer: Yearly chest X-rays can detect lung cancer and should be done on high risk women, such as cigarette smokers and women with chronic lung disease (emphysema).  Skin Cancer: A complete body skin exam should be done at your yearly examination. Avoid overexposure to the sun and ultraviolet light lamps. Use a strong sun block cream when in the sun. All of these things are important in lowering the risk of skin cancer. MENOPAUSE Menopause Symptoms: Hormone therapy products are effective for treating symptoms associated with menopause:  Moderate to severe hot flashes.  Night sweats.  Mood swings.  Headaches.  Tiredness.  Loss of sex drive.  Insomnia.  Other symptoms. Hormone replacement carries certain risks, especially in older women. Women who use or are thinking about using estrogen or estrogen with progestin treatments should discuss that with their caregiver. Your caregiver will help you understand the benefits and risks. The ideal dose of hormone replacement therapy is not known. The Food and Drug Administration (FDA) has concluded that hormone therapy should be used only at the lowest doses and for the shortest amount of time to reach treatment goals.  OSTEOPOROSIS Protecting Against Bone Loss and Preventing Fracture: If you use hormone therapy for prevention of bone loss (osteoporosis), the risks for bone loss must outweigh the risk of the therapy. Ask your caregiver about other medications known to be safe and effective for preventing bone loss and fractures. To guard against bone loss or fractures, the following is recommended:  If  you are less than age 50, take 1000 mg of calcium and at least 600 mg of Vitamin D per day.  If you are greater than age 50 but less than age 70, take 1200 mg of calcium and at least 600 mg of Vitamin D per day.  If you are greater than age 70, take 1200 mg of calcium and at least 800 mg of Vitamin D per day. Smoking and excessive alcohol intake increases the risk of osteoporosis. Eat foods rich in calcium and vitamin D and do weight bearing exercises several times a week as your caregiver suggests. DIABETES Diabetes Melitus: If you have Type I or Type 2 diabetes, you should keep your blood sugar under control with diet, exercise and recommended medication. Avoid too many sweets, starchy and fatty foods. Being overweight can make control more difficult. COGNITION AND MEMORY Cognition and Memory: Menopausal hormone therapy is not recommended for the prevention of cognitive disorders such as Alzheimer's disease or memory loss.  DEPRESSION  Depression may occur at any age, but is common in elderly women. The reasons may be because of physical, medical, social (loneliness), or financial problems and needs. If you are experiencing depression because of medical problems and control of symptoms, talk to your caregiver about this. Physical activity and   exercise may help with mood and sleep. Community and volunteer involvement may help your sense of value and worth. If you have depression and you feel that the problem is getting worse or becoming severe, talk to your caregiver about treatment options that are best for you. ACCIDENTS  Accidents are common and can be serious in the elderly woman. Prepare your house to prevent accidents. Eliminate throw rugs, place hand bars in the bath, shower and toilet areas. Avoid wearing high heeled shoes or walking on wet, snowy, and icy areas. Limit or stop driving if you have vision or hearing problems, or you feel you are unsteady with you movements and  reflexes. HEPATITIS C Hepatitis C is a type of viral infection affecting the liver. It is spread mainly through contact with blood from an infected person. It can be treated, but if left untreated, it can lead to severe liver damage over years. Many people who are infected do not know that the virus is in their blood. If you are a "baby-boomer", it is recommended that you have one screening test for Hepatitis C. IMMUNIZATIONS  Several immunizations are important to consider having during your senior years, including:   Tetanus, diptheria, and pertussis booster shot.  Influenza every year before the flu season begins.  Pneumonia vaccine.  Shingles vaccine.  Others as indicated based on your specific needs. Talk to your caregiver about these. Document Released: 05/31/2005 Document Revised: 03/25/2012 Document Reviewed: 01/25/2008 [employer] Patient Information 2014 [employer].  

## 2013-05-01 LAB — URINE CULTURE: Colony Count: 50000

## 2013-05-10 ENCOUNTER — Other Ambulatory Visit: Payer: Self-pay | Admitting: Cardiology

## 2013-06-19 ENCOUNTER — Encounter: Payer: Self-pay | Admitting: Emergency Medicine

## 2013-06-19 ENCOUNTER — Emergency Department
Admission: EM | Admit: 2013-06-19 | Discharge: 2013-06-19 | Disposition: A | Discharge status: Home / Self Care | Payer: BC Managed Care – PPO | Source: Home / Self Care | Attending: Family Medicine | Admitting: Family Medicine

## 2013-06-19 DIAGNOSIS — M791 Myalgia, unspecified site: Secondary | ICD-10-CM

## 2013-06-19 DIAGNOSIS — IMO0001 Reserved for inherently not codable concepts without codable children: Secondary | ICD-10-CM

## 2013-06-19 MED ORDER — CYCLOBENZAPRINE HCL 5 MG PO TABS
ORAL_TABLET | ORAL | Status: DC
Start: 2013-06-19 — End: 2014-03-04

## 2013-06-19 NOTE — Discharge Instructions (Signed)
If cold symptoms develop, may begin the following: Take plain Mucinex (1200 mg guaifenesin) twice daily for cough and congestion.  Increase fluid intake, rest. May use Afrin nasal spray (or generic oxymetazoline) twice daily for about 5 days.  Also recommend using saline nasal spray several times daily and saline nasal irrigation (AYR is a common brand) Try warm salt water gargles for sore throat.  May take Delsym Cough Suppressant at bedtime for nighttime cough.   Follow-up with family doctor if not improving 7 to 10 days.

## 2013-06-19 NOTE — ED Notes (Signed)
[name] complains of neck, shoulder, back and chest pain for 1 day. The only thing she has done different has been to shovel the driveway. She states the pain is a 5/10 and is sometimes sharp. Back pain is worse than the rest.

## 2013-06-19 NOTE — ED Provider Notes (Signed)
CSN: [id number]     Arrival date & time 06/19/13  1754 History   First MD Initiated Contact with Patient 06/19/13 1818     Chief Complaint  Patient presents with  . Shoulder Pain    x 1 day  . Neck Pain    x 1 day  . Back Pain    x 1 day  . Chest Pain    x 1 day      HPI Comments: Patient states that she shoveled snow for about 5 minutes yesterday.  Later she developed soreness in her posterior neck and bilateral scapular area.  The muscle pain is worse than she would expect after the minimal shoveling that she did yesterday, and is worse today.  She notes that she has been mildly fatigued for several days.  She has had some chills and notes that her throat has been slightly irritated.  The history is provided by the patient.    Past Medical History  Diagnosis Date  . Hypothyroidism   . Hyperlipidemia   . SVT (supraventricular tachycardia)   . GERD (gastroesophageal reflux disease)    Past Surgical History  Procedure Laterality Date  . Cesarean section  1987  . Breast surgery  1976    CYST REMOVED  . Hysteroscopy  9.14.2012    w/myomectomy  . Left thumb surgery and carpal tunnel release  9/14   Family History  Problem Relation Age of Onset  . Hypertension Mother   . Heart disease Mother   . Mitral valve prolapse Mother   . Glaucoma Mother   . Arthritis Mother   . Hypertension Father   . Heart disease Father   . Colon polyps Father   . Glaucoma Father   . Arthritis Father   . Hypertension Brother   . Mitral valve prolapse Brother   . Glaucoma Brother   . Arthritis Brother   . Arthritis Sister    History  Substance Use Topics  . Smoking status: Never Smoker   . Smokeless tobacco: Never Used  . Alcohol Use: Yes     Comment: rare   OB History   Grav Para Term Preterm Abortions TAB SAB Ect Mult Living   1 1 1       1      Review of Systems + minimal sore throat No cough No pleuritic pain No wheezing No nasal congestion No post-nasal drainage No sinus  pain/pressure No itchy/red eyes No earache No hemoptysis No SOB No fever, + chills No nausea No vomiting No abdominal pain No diarrhea No urinary symptoms No skin rash + fatigue + myalgias + headache    Allergies  Lipitor and Lidocaine-epinephrine  Home Medications   Current Outpatient Rx  Name  Route  Sig  Dispense  Refill  . Cholecalciferol (VITAMIN D3) 10000 UNITS capsule   Oral   Take 10,000 Units by mouth daily.         [name] esomeprazole (NEXIUM) 40 MG capsule   Oral   Take 40 mg by mouth daily at 12 noon.         . fish oil-omega-3 fatty acids 1000 MG capsule   Oral   Take 1 g by mouth daily.         [name] ibuprofen (ADVIL,MOTRIN) 200 MG tablet   Oral   Take 400 mg by mouth every 8 (eight) hours as needed for pain.          . metoprolol succinate (TOPROL-XL) 25 MG 24 hr  tablet      TAKE 1 TABLET (25 MG TOTAL) BY MOUTH AT BEDTIME.   90 tablet   0   . Multiple Vitamin (MULTIVITAMIN WITH MINERALS) TABS tablet   Oral   Take 1 tablet by mouth daily.         . rosuvastatin (CRESTOR) 10 MG tablet   Oral   Take 10 mg by mouth 2 (two) times a week.         . thyroid (ARMOUR) 32.5 MG tablet      1 am 1pm         . cyclobenzaprine (FLEXERIL) 5 MG tablet      Take one-half to one tab PO, 2 times daily, and two at bedtime for muscle cramps   15 tablet   1    BP 114/76  Pulse 65  Temp(Src) 98.1 F (36.7 C) (Oral)  Ht 5\' 3"  (1.6 m)  Wt 154 lb (69.854 kg)  BMI 27.29 kg/m2  SpO2 100%  LMP 11/26/2000 Physical Exam Nursing notes and Vital Signs reviewed. Appearance:  Patient appears healthy, stated age, and in no acute distress Eyes:  Pupils are equal, round, and reactive to light and accomodation.  Extraocular movement is intact.  Conjunctivae are not inflamed  Neck:  Supple.   No adenopathy.  Mild tenderness bilateral trapezius muscles Lungs:  Clear to auscultation.  Breath sounds are equal.  Heart:  Regular rate and rhythm without murmurs,  rubs, or gallops.  Abdomen:  Nontender without masses or hepatosplenomegaly.  Bowel sounds are present.  No CVA or flank tenderness.  Extremities:  No edema.  No calf tenderness Skin:  No rash present. Back (upper):  Tenderness along medial edges of scapulae bilaterally    ED Course  Procedures none       MDM   1. Myalgia; ?early viral prodrome    Begin Flexeril. If cold symptoms develop, may begin the following: Take plain Mucinex (1200 mg guaifenesin) twice daily for cough and congestion.  Increase fluid intake, rest. May use Afrin nasal spray (or generic oxymetazoline) twice daily for about 5 days.  Also recommend using saline nasal spray several times daily and saline nasal irrigation (AYR is a common brand) Try warm salt water gargles for sore throat.  May take Delsym Cough Suppressant at bedtime for nighttime cough.   Followup with Dr. Aundria Mems ([facility]) if not improving about two weeks.      Kandra Nicolas, MD 06/21/13 [phone]

## 2013-06-30 ENCOUNTER — Ambulatory Visit (INDEPENDENT_AMBULATORY_CARE_PROVIDER_SITE_OTHER): Payer: BC Managed Care – PPO | Admitting: Cardiology

## 2013-06-30 ENCOUNTER — Encounter: Payer: Self-pay | Admitting: Cardiology

## 2013-06-30 VITALS — BP 112/74 | HR 73 | Ht 63.0 in | Wt 152.4 lb

## 2013-06-30 DIAGNOSIS — I471 Supraventricular tachycardia: Secondary | ICD-10-CM

## 2013-06-30 DIAGNOSIS — I498 Other specified cardiac arrhythmias: Secondary | ICD-10-CM

## 2013-06-30 DIAGNOSIS — I499 Cardiac arrhythmia, unspecified: Secondary | ICD-10-CM

## 2013-06-30 DIAGNOSIS — E069 Thyroiditis, unspecified: Secondary | ICD-10-CM

## 2013-06-30 DIAGNOSIS — I119 Hypertensive heart disease without heart failure: Secondary | ICD-10-CM

## 2013-06-30 NOTE — Assessment & Plan Note (Signed)
Her blood pressures remaining stable on Toprol 25 mg daily.  She's not having any dizziness or syncope.  No exertional chest pain.  No symptoms of CHF.

## 2013-06-30 NOTE — Assessment & Plan Note (Signed)
She appears to be clinically euthyroid on current dose of thyroid replacement

## 2013-06-30 NOTE — Assessment & Plan Note (Signed)
The patient appears to be tolerating current dose of metoprolol with good control and prevention of recurrent SVT.  He notes an occasional slight fluttering in her chest which lasts less than 5 seconds.  Is not having any exertional symptoms of chest pain or shortness of breath.

## 2013-06-30 NOTE — Patient Instructions (Signed)
Your physician wants you to follow-up in: 4 month ov/ekg  You will receive a reminder letter in the mail two months in advance. If you don't receive a letter, please call our office to schedule the follow-up appointment.

## 2013-06-30 NOTE — Progress Notes (Signed)
Ford Date of Birth:  1950/07/17 [place], [place]  [address] [phone]         Fax   [phone]  History of Present Illness: This 63 year old woman was originally seen at the request of Dr. Theadore Nan.  She returns now for a scheduled office visit.  She has a past history of essential hypertension and a history of palpitations and tachycardia. She is being seen for followup of tachycardia.  She gives an interesting cardiac history.  She has had a long history of paroxysmal supraventricular tachycardia.  She states that she had an ablation for SVT at [facility] in 2001.  Since then she has had infrequent episodes of SVT.  Many of her episodes have occurred following surgical procedures.  Last October she underwent left arm carpal tunnel surgery with tendon transfer at the [facility] by Dr. Burney Gauze.  She was monitored during the operation and apparently had no arrhythmia.  However 48 hours later she had onset of SVT and had episodes Friday evening, Sunday evening, and Tuesday morning.  She brought in EKGs for me to review but these were obtained after the SVT had broken.  The patient is familiar with trying forms of the Valsalva maneuver at home.  Interestingly, she has been on Toprol 12.5 mg chronically but for the week leading up to her hand surgery she took a larger dose of 25 mg daily.  Following surgery she went back to the smaller dose and it was during that period of time that she had the breakthrough arrhythmias.  She is not on any other blood pressure medicines other than Toprol 25 mg daily.  She recently was found to be mildly hypokalemic and had recent supplementation of potassium.  At her recent emergency room visit she had a CT angiogram of the chest which was negative for pulmonary emboli. Additional medical concerns of hers includes chronic right upper quadrant discomfort and she is followed for this by  Dr. Penelope Coop.  A recent ultrasound apparently showed distended gallbladder but no stones and her recent liver function studies were normal. Since her last office visit here she has had an echocardiogram on 02/02/13 which showed normal left ventricular function with ejection fraction 55-60% and no significant abnormalities. The patient wore a 30 day event monitor in October 2014.  This showed only normal sinus rhythm and there were no episodes of SVT noted even though she thought that she had felt some episodes. Since we last saw her she is now being treated for Hashimoto's thyroiditis by her PCP.  She is now also on hormone replacement therapy with transdermal estrogen patch.  Current Outpatient Prescriptions  Medication Sig Dispense Refill  . Cholecalciferol (VITAMIN D3) 5000 UNITS TABS Take by mouth.      . cyclobenzaprine (FLEXERIL) 5 MG tablet Take one-half to one tab PO, 2 times daily, and two at bedtime for muscle cramps  15 tablet  1  . esomeprazole (NEXIUM) 40 MG capsule Take 40 mg by mouth daily at 12 noon.      [name] estradiol (VIVELLE-DOT) 0.05 MG/24HR patch Place 1 patch onto the skin 2 (two) times a week.      . fish oil-omega-3 fatty acids 1000 MG capsule Take 1 g by mouth daily.      [name] ibuprofen (ADVIL,MOTRIN) 200 MG tablet Take 400 mg by mouth every 8 (eight) hours as needed for pain.       [name]  metoprolol succinate (TOPROL-XL) 25 MG 24 hr tablet TAKE 1 TABLET (25 MG TOTAL) BY MOUTH AT BEDTIME.  90 tablet  0  . Multiple Vitamin (MULTIVITAMIN WITH MINERALS) TABS tablet Take 1 tablet by mouth daily.      . progesterone (PROMETRIUM) 100 MG capsule Take 100 mg by mouth as directed.       . rosuvastatin (CRESTOR) 10 MG tablet Take 10 mg by mouth 2 (two) times a week.      . thyroid (ARMOUR) 32.5 MG tablet 1 am 1pm      . VERAMYST 27.5 MCG/SPRAY nasal spray Place into the nose as needed.        No current facility-administered medications for this visit.    Allergies  Allergen Reactions  .  Lipitor [Atorvastatin]     Muscle aches  . Lidocaine-Epinephrine [Lidocaine-Epinephrine] Palpitations    Patient Active Problem List   Diagnosis Date Noted  . Osteopenia 04/30/2013  . Thyroiditis 03/29/2013  . 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 Hypothyroidism 04/27/2012  . Hyperlipidemia 04/27/2012  . SVT (supraventricular tachycardia) 04/27/2012  . Endometrial polyp 12/04/2010  . Fibroid 12/04/2010  . Fibroid, uterus 11/27/2010    History  Smoking status  . Never Smoker   Smokeless tobacco  . Never Used    History  Alcohol Use  . Yes    Comment: rare    Family History  Problem Relation Age of Onset  . Hypertension Mother   . Heart disease Mother   . Mitral valve prolapse Mother   . Glaucoma Mother   . Arthritis Mother   . Hypertension Father   . Heart disease Father   . Colon polyps Father   . Glaucoma Father   . Arthritis Father   . Hypertension Brother   . Mitral valve prolapse Brother   . Glaucoma Brother   . Arthritis Brother   . Arthritis Sister     Review of Systems: Constitutional: no fever chills diaphoresis or fatigue or change in weight.  Head and neck: no hearing loss, no epistaxis, no photophobia or visual disturbance. Respiratory: No cough, shortness of breath or wheezing. Cardiovascular: No chest pain peripheral edema, positive for palpitations with sudden onset and offset Gastrointestinal: No abdominal distention, no abdominal pain, she has had some recent loose stools and increasing intestinal gas which she attributes to possible gallbladder problems Genitourinary: No dysuria, no frequency, no urgency, no nocturia. Musculoskeletal:No arthralgias, no back pain, no gait disturbance or myalgias. Neurological: No dizziness, no headaches, no numbness, no seizures, no syncope, no weakness, no tremors. Hematologic: No lymphadenopathy, no easy bruising. Psychiatric: No confusion, no hallucinations, no sleep  disturbance.    Physical Exam: Filed Vitals:   06/30/13 1008  BP: 112/74  Pulse: 73   the general appearance reveals a well-developed well-nourished alert woman in no acute distress.The head and neck exam reveals pupils equal and reactive.  Extraocular movements are full.  There is no scleral icterus.  The mouth and pharynx are normal.  The neck is supple.  The carotids reveal no bruits.  The jugular venous pressure is normal.  The  thyroid is not enlarged.  There is no lymphadenopathy.  The chest is clear to percussion and auscultation.  There are no rales or rhonchi.  Expansion of the chest is symmetrical.  The precordium is quiet.  The first heart sound is normal.  The second heart sound is physiologically split.  There is no murmur gallop rub or click.  There is no abnormal lift or heave.  The abdomen is soft and nontender.  The bowel sounds are normal.  The liver and spleen are not enlarged.  There are no abdominal masses.  There are no abdominal bruits.  Extremities reveal good pedal pulses.  There is no phlebitis or edema.  There is no cyanosis or clubbing.  Strength is normal and symmetrical in all extremities.  There is no lateralizing weakness.  There are no sensory deficits.  The skin is warm and dry.  There is no rash   Assessment / Plan:  The patient is to continue same medication.  Continue close followup with Dr. Theadore Nan. Recheck here in 4 months for office visit and EKG. She is on a careful gluten-free diet and has lost 12 pounds since last visit.  The thyroid replacement hormone may also have helped with the weight loss.

## 2013-07-14 ENCOUNTER — Other Ambulatory Visit: Payer: Self-pay | Admitting: Gastroenterology

## 2013-07-14 DIAGNOSIS — R1310 Dysphagia, unspecified: Secondary | ICD-10-CM

## 2013-07-19 ENCOUNTER — Ambulatory Visit
Admission: RE | Admit: 2013-07-19 | Discharge: 2013-07-19 | Disposition: A | Discharge status: Home / Self Care | Payer: BC Managed Care – PPO | Source: Ambulatory Visit | Attending: Gastroenterology | Admitting: Gastroenterology

## 2013-07-19 DIAGNOSIS — R1310 Dysphagia, unspecified: Secondary | ICD-10-CM

## 2013-08-05 ENCOUNTER — Other Ambulatory Visit: Payer: Self-pay | Admitting: Cardiology

## 2013-09-22 ENCOUNTER — Telehealth: Payer: Self-pay | Admitting: Cardiology

## 2013-09-22 NOTE — Telephone Encounter (Signed)
New message     Pt is due for an colonoscopy.  She has palpitations after being under anesthesia.  She said she called over a week ago and no one has returned her call. Please call her and tell her something today

## 2013-09-22 NOTE — Telephone Encounter (Signed)
Patient states that she over the last 5 years every time she has been under anesthesia she has ended up in ED from SVT. Per patient Dr Penelope Coop recommended she call office to see if virtual colonoscopy recommended (he will have to do a letter) and if abnormal proceed with colonoscopy and what should be done at that point. Will forward to  Dr. Mare Ferrari for review

## 2013-09-23 NOTE — Telephone Encounter (Signed)
With her history of SVT with anesthesia, it would be reasonable to start with a virtual colonoscopy.  If the virtual colonoscopy is abnormal then she would need a standard colonoscopy in which case we would just need to be prepared to give IV metoprolol during anesthesia if necessary.

## 2013-09-24 NOTE — Telephone Encounter (Signed)
Advised patient and will send phone note to Dr Penelope Coop

## 2013-10-22 ENCOUNTER — Encounter: Payer: Self-pay | Admitting: Emergency Medicine

## 2013-10-22 ENCOUNTER — Emergency Department
Admission: EM | Admit: 2013-10-22 | Discharge: 2013-10-22 | Disposition: A | Discharge status: Home / Self Care | Payer: BC Managed Care – PPO | Source: Home / Self Care | Attending: Emergency Medicine | Admitting: Emergency Medicine

## 2013-10-22 DIAGNOSIS — R5381 Other malaise: Secondary | ICD-10-CM

## 2013-10-22 DIAGNOSIS — R143 Flatulence: Secondary | ICD-10-CM

## 2013-10-22 DIAGNOSIS — R140 Abdominal distension (gaseous): Secondary | ICD-10-CM

## 2013-10-22 DIAGNOSIS — R142 Eructation: Secondary | ICD-10-CM

## 2013-10-22 DIAGNOSIS — R5383 Other fatigue: Secondary | ICD-10-CM

## 2013-10-22 DIAGNOSIS — R141 Gas pain: Secondary | ICD-10-CM

## 2013-10-22 DIAGNOSIS — R110 Nausea: Secondary | ICD-10-CM

## 2013-10-22 MED ORDER — SIMETHICONE 80 MG PO TABS: 80.0000 mg | ORAL_TABLET | Freq: Four times a day (QID) | ORAL | Status: DC | PRN

## 2013-10-22 MED ORDER — PROMETHAZINE HCL 12.5 MG PO TABS: 12.5000 mg | ORAL_TABLET | Freq: Four times a day (QID) | ORAL | Status: DC | PRN

## 2013-10-22 NOTE — ED Notes (Signed)
[name] complains of nausea, abdominal discomfort, bloating and diarrhea for 4 days. She does report sweats. Normal bowel movements. Denies fever or chills.  She has had some sinus problems for a couple of day. She states pressure in face.

## 2013-10-22 NOTE — ED Provider Notes (Signed)
CSN: [id number]     Arrival date & time 10/22/13  5277 History   First MD Initiated Contact with Patient 10/22/13 [phone]     No chief complaint on file.  (Consider location/radiation/quality/duration/timing/severity/associated sxs/prior Treatment) HPI This patient presents with some vague abdominal complaints and discomfort including some nausea, no vomiting, loose stools but no diarrhea for the last 4 days.  She also reports some bloating and increased gas.  No known sick contacts.  She's been trying some Maalox and Nexium and Pepto-Bismol over-the-counter which has not helped very much.  No fever chills but had some night sweats and hot flashes symptoms.  No true abdominal pain.  She last saw her PCP about 4 weeks ago.  No urinary symptoms.   Past Medical History  Diagnosis Date  . Hypothyroidism   . Hyperlipidemia   . SVT (supraventricular tachycardia)   . GERD (gastroesophageal reflux disease)    Past Surgical History  Procedure Laterality Date  . Cesarean section  1987  . Breast surgery  1976    CYST REMOVED  . Hysteroscopy  9.14.2012    w/myomectomy  . Left thumb surgery and carpal tunnel release  9/14   Family History  Problem Relation Age of Onset  . Hypertension Mother   . Heart disease Mother   . Mitral valve prolapse Mother   . Glaucoma Mother   . Arthritis Mother   . Hypertension Father   . Heart disease Father   . Colon polyps Father   . Glaucoma Father   . Arthritis Father   . Hypertension Brother   . Mitral valve prolapse Brother   . Glaucoma Brother   . Arthritis Brother   . Arthritis Sister    History  Substance Use Topics  . Smoking status: Never Smoker   . Smokeless tobacco: Never Used  . Alcohol Use: Yes     Comment: rare   OB History   Grav Para Term Preterm Abortions TAB SAB Ect Mult Living   1 1 1       1      Review of Systems  All other systems reviewed and are negative.   Allergies  Lipitor and Lidocaine-epinephrine  Home  Medications   Prior to Admission medications   Medication Sig Start Date End Date Taking? Authorizing Provider  Cholecalciferol (VITAMIN D3) 5000 UNITS TABS Take by mouth.    Historical Provider, MD  cyclobenzaprine (FLEXERIL) 5 MG tablet Take one-half to one tab PO, 2 times daily, and two at bedtime for muscle cramps 06/19/13   Kandra Nicolas, MD  esomeprazole (NEXIUM) 40 MG capsule Take 40 mg by mouth daily at 12 noon.    Historical Provider, MD  estradiol (VIVELLE-DOT) 0.05 MG/24HR patch Place 1 patch onto the skin 2 (two) times a week.    Historical Provider, MD  fish oil-omega-3 fatty acids 1000 MG capsule Take 1 g by mouth daily.    Historical Provider, MD  ibuprofen (ADVIL,MOTRIN) 200 MG tablet Take 400 mg by mouth every 8 (eight) hours as needed for pain.     Historical Provider, MD  metoprolol succinate (TOPROL-XL) 25 MG 24 hr tablet TAKE 1 TABLET (25 MG TOTAL) BY MOUTH AT BEDTIME. 08/05/13   Darlin Coco, MD  Multiple Vitamin (MULTIVITAMIN WITH MINERALS) TABS tablet Take 1 tablet by mouth daily.    Historical Provider, MD  progesterone (PROMETRIUM) 100 MG capsule Take 100 mg by mouth as directed.  06/17/13   Historical Provider, MD  promethazine (PHENERGAN) 12.5  MG tablet Take 1 tablet (12.5 mg total) by mouth every 6 (six) hours as needed for nausea or vomiting. 10/22/13   Janeann Forehand, MD  rosuvastatin (CRESTOR) 10 MG tablet Take 10 mg by mouth 2 (two) times a week.    Historical Provider, MD  Simethicone 80 MG TABS Take 1 tablet (80 mg total) by mouth every 6 (six) hours as needed. 10/22/13   Janeann Forehand, MD  thyroid (ARMOUR) 32.5 MG tablet 1 am 1pm    Historical Provider, MD  VERAMYST 27.5 MCG/SPRAY nasal spray Place into the nose as needed.  04/01/13   Historical Provider, MD   LMP 11/26/2000 Physical Exam  Nursing note and vitals reviewed. Constitutional: She is oriented to person, place, and time. She appears well-developed and well-nourished.  Non-toxic  appearance. No distress.  HENT:  Head: Normocephalic and atraumatic.  Right Ear: Tympanic membrane, external ear and ear canal normal.  Left Ear: Tympanic membrane, external ear and ear canal normal.  Nose: Nose normal.  Mouth/Throat: Uvula is midline and oropharynx is clear and moist.  Eyes: No scleral icterus.  Neck: Neck supple.  Cardiovascular: Normal rate, regular rhythm and normal heart sounds.   Pulmonary/Chest: Effort normal and breath sounds normal. No respiratory distress. She has no decreased breath sounds. She has no wheezes. She has no rhonchi.  Abdominal: Soft. Normal appearance. There is no rigidity, no rebound, no guarding, no CVA tenderness, no tenderness at McBurney's point and negative Murphy's sign.  Mild generalized tenderness to palpation especially epigastric and periumbilical  Neurological: She is alert and oriented to person, place, and time.  Skin: Skin is warm and dry.  Psychiatric: She has a normal mood and affect. Her speech is normal.    ED Course  Procedures (including critical care time) Labs Review Labs Reviewed - No data to display  Imaging Review No results found.   MDM   1. Nausea alone   2. Abdominal bloating    Patient with bloating and nausea, likely just secondary to a mild gastroenteritis.   prescription given for Phenergan and over-the-counter simethicone. we'll also obtain a TSH since she has recently changed her  Synthroid for now recheck levels.  We'll also get a CMP amylase and lipase.  Encourage bland diet (nothing spicy, greasy, fried, or acidic), hydration, and rest.  If worsening of pain, nausea, or other symptoms, advised to contact your PCP.   Janeann Forehand, MD 10/22/13 0930

## 2013-10-23 ENCOUNTER — Telehealth: Payer: Self-pay | Admitting: Emergency Medicine

## 2013-10-23 LAB — COMPREHENSIVE METABOLIC PANEL
ALT: 40 U/L — ABNORMAL HIGH (ref 0–35)
AST: 25 U/L (ref 0–37)
Albumin: 4.8 g/dL (ref 3.5–5.2)
Alkaline Phosphatase: 93 U/L (ref 39–117)
BUN: 17 mg/dL (ref 6–23)
CO2: 28 mEq/L (ref 19–32)
Calcium: 10.4 mg/dL (ref 8.4–10.5)
Chloride: 103 mEq/L (ref 96–112)
Creat: 0.84 mg/dL (ref 0.50–1.10)
Glucose, Bld: 89 mg/dL (ref 70–99)
Potassium: 3.9 mEq/L (ref 3.5–5.3)
Sodium: 136 mEq/L (ref 135–145)
Total Bilirubin: 0.6 mg/dL (ref 0.2–1.2)
Total Protein: 7.1 g/dL (ref 6.0–8.3)

## 2013-10-23 LAB — LIPASE: Lipase: 36 U/L (ref 0–75)

## 2013-10-23 LAB — AMYLASE: Amylase: 72 U/L (ref 0–105)

## 2013-10-23 LAB — TSH: TSH: 1.601 u[IU]/mL (ref 0.350–4.500)

## 2013-10-24 NOTE — ED Notes (Signed)
Left a message on voice mail asking how patient is feeling and advising to call back with any questions or concerns. Also, left message advising normal labs.

## 2013-10-27 ENCOUNTER — Telehealth: Payer: Self-pay | Admitting: Cardiology

## 2013-10-27 NOTE — Telephone Encounter (Signed)
New Message  Pt called requests a call back. She says that Dr. Penelope Coop and Dr. Mare Ferrari was to discuss a clearance for her upcoming procedure and this has not happened as of yet. Pt requests a call back to discuss further. Please call.

## 2013-10-27 NOTE — Telephone Encounter (Signed)
Sent phone note from 09/22/13 to Dr Penelope Coop again, patient aware

## 2013-11-07 ENCOUNTER — Other Ambulatory Visit: Payer: Self-pay | Admitting: Cardiology

## 2013-11-08 NOTE — Telephone Encounter (Signed)
Jessica Coco, MD at 06/30/2013 10:52 AM metoprolol succinate (TOPROL-XL) 25 MG 24 hr tablet  TAKE 1 TABLET (25 MG TOTAL) BY MOUTH AT BEDTIME.   90 tablet

## 2013-12-08 ENCOUNTER — Other Ambulatory Visit: Payer: Self-pay | Admitting: Gastroenterology

## 2013-12-08 DIAGNOSIS — K869 Disease of pancreas, unspecified: Secondary | ICD-10-CM

## 2014-01-03 ENCOUNTER — Emergency Department
Admission: EM | Admit: 2014-01-03 | Discharge: 2014-01-03 | Disposition: A | Discharge status: Home / Self Care | Payer: BC Managed Care – PPO | Source: Home / Self Care

## 2014-01-03 ENCOUNTER — Encounter: Payer: Self-pay | Admitting: Emergency Medicine

## 2014-01-03 DIAGNOSIS — J0121 Acute recurrent ethmoidal sinusitis: Secondary | ICD-10-CM

## 2014-01-03 DIAGNOSIS — J0120 Acute ethmoidal sinusitis, unspecified: Secondary | ICD-10-CM

## 2014-01-03 DIAGNOSIS — H6121 Impacted cerumen, right ear: Secondary | ICD-10-CM

## 2014-01-03 DIAGNOSIS — H6120 Impacted cerumen, unspecified ear: Secondary | ICD-10-CM

## 2014-01-03 MED ORDER — AMOXICILLIN 500 MG PO CAPS: 500.0000 mg | ORAL_CAPSULE | Freq: Three times a day (TID) | ORAL | Status: DC

## 2014-01-03 NOTE — Discharge Instructions (Signed)
Cerumen Impaction A cerumen impaction is when the wax in your ear forms a plug. This plug usually causes reduced hearing. Sometimes it also causes an earache or dizziness. Removing a cerumen impaction can be difficult and painful. The wax sticks to the ear canal. The canal is sensitive and bleeds easily. If you try to remove a heavy wax buildup with a cotton tipped swab, you may push it in further. Irrigation with water, suction, and small ear curettes may be used to clear out the wax. If the impaction is fixed to the skin in the ear canal, ear drops may be needed for a few days to loosen the wax. People who build up a lot of wax frequently can use ear wax removal products available in your local drugstore. SEEK MEDICAL CARE IF:  You develop an earache, increased hearing loss, or marked dizziness. Document Released: 05/16/2004 Document Revised: 07/01/2011 Document Reviewed: 07/06/2009 ExitCare Patient Information 2015 [employer]. This information is not intended to replace advice given to you by your health care provider. Make sure you discuss any questions you have with your health care provider. Sinusitis Sinusitis is redness, soreness, and inflammation of the paranasal sinuses. Paranasal sinuses are air pockets within the bones of your face (beneath the eyes, the middle of the forehead, or above the eyes). In healthy paranasal sinuses, mucus is able to drain out, and air is able to circulate through them by way of your nose. However, when your paranasal sinuses are inflamed, mucus and air can become trapped. This can allow bacteria and other germs to grow and cause infection. Sinusitis can develop quickly and last only a short time (acute) or continue over a long period (chronic). Sinusitis that lasts for more than 12 weeks is considered chronic.  CAUSES  Causes of sinusitis include:  Allergies.  Structural abnormalities, such as displacement of the cartilage that separates your nostrils  (deviated septum), which can decrease the air flow through your nose and sinuses and affect sinus drainage.  Functional abnormalities, such as when the small hairs (cilia) that line your sinuses and help remove mucus do not work properly or are not present. SIGNS AND SYMPTOMS  Symptoms of acute and chronic sinusitis are the same. The primary symptoms are pain and pressure around the affected sinuses. Other symptoms include:  Upper toothache.  Earache.  Headache.  Bad breath.  Decreased sense of smell and taste.  A cough, which worsens when you are lying flat.  Fatigue.  Fever.  Thick drainage from your nose, which often is green and may contain pus (purulent).  Swelling and warmth over the affected sinuses. DIAGNOSIS  Your health care provider will perform a physical exam. During the exam, your health care provider may:  Look in your nose for signs of abnormal growths in your nostrils (nasal polyps).  Tap over the affected sinus to check for signs of infection.  View the inside of your sinuses (endoscopy) using an imaging device that has a light attached (endoscope). If your health care provider suspects that you have chronic sinusitis, one or more of the following tests may be recommended:  Allergy tests.  Nasal culture. A sample of mucus is taken from your nose, sent to a lab, and screened for bacteria.  Nasal cytology. A sample of mucus is taken from your nose and examined by your health care provider to determine if your sinusitis is related to an allergy. TREATMENT  Most cases of acute sinusitis are related to a viral infection   and will resolve on their own within 10 days. Sometimes medicines are prescribed to help relieve symptoms (pain medicine, decongestants, nasal steroid sprays, or saline sprays).  However, for sinusitis related to a bacterial infection, your health care provider will prescribe antibiotic medicines. These are medicines that will help kill the  bacteria causing the infection.  Rarely, sinusitis is caused by a fungal infection. In theses cases, your health care provider will prescribe antifungal medicine. For some cases of chronic sinusitis, surgery is needed. Generally, these are cases in which sinusitis recurs more than 3 times per year, despite other treatments. HOME CARE INSTRUCTIONS   Drink plenty of water. Water helps thin the mucus so your sinuses can drain more easily.  Use a humidifier.  Inhale steam 3 to 4 times a day (for example, sit in the bathroom with the shower running).  Apply a warm, moist washcloth to your face 3 to 4 times a day, or as directed by your health care provider.  Use saline nasal sprays to help moisten and clean your sinuses.  Take medicines only as directed by your health care provider.  If you were prescribed either an antibiotic or antifungal medicine, finish it all even if you start to feel better. SEEK IMMEDIATE MEDICAL CARE IF:  You have increasing pain or severe headaches.  You have nausea, vomiting, or drowsiness.  You have swelling around your face.  You have vision problems.  You have a stiff neck.  You have difficulty breathing. MAKE SURE YOU:   Understand these instructions.  Will watch your condition.  Will get help right away if you are not doing well or get worse. Document Released: 04/08/2005 Document Revised: 08/23/2013 Document Reviewed: 04/23/2011 Exit[employer] Patient Information 2015 [employer]. This information is not intended to replace advice given to you by your health care provider. Make sure you discuss any questions you have with your health care provider.  

## 2014-01-03 NOTE — ED Notes (Signed)
Gives 4 day history of sinus congestion and increasing discomfort; some burning of throat and ear fullness.

## 2014-01-03 NOTE — ED Provider Notes (Signed)
CSN: [id number]     Arrival date & time 01/03/14  1744 History   None    Chief Complaint  Patient presents with  . Facial Pain  . Nasal Congestion  . Sore Throat  . Back Pain  . Ear Fullness   (Consider location/radiation/quality/duration/timing/severity/associated sxs/prior Treatment) Patient is a 63 y.o. female presenting with pharyngitis, back pain, and plugged ear sensation. The history is provided by the patient. No language interpreter was used.  Sore Throat This is a new problem. Episode onset: 4 days. The problem occurs constantly. The problem has been gradually worsening. Nothing relieves the symptoms. She has tried nothing for the symptoms. The treatment provided no relief.  Back Pain Ear Fullness    Past Medical History  Diagnosis Date  . Hypothyroidism   . Hyperlipidemia   . SVT (supraventricular tachycardia)   . GERD (gastroesophageal reflux disease)    Past Surgical History  Procedure Laterality Date  . Cesarean section  1987  . Breast surgery  1976    CYST REMOVED  . Hysteroscopy  9.14.2012    w/myomectomy  . Left thumb surgery and carpal tunnel release  9/14   Family History  Problem Relation Age of Onset  . Hypertension Mother   . Heart disease Mother   . Mitral valve prolapse Mother   . Glaucoma Mother   . Arthritis Mother   . Hypertension Father   . Heart disease Father   . Colon polyps Father   . Glaucoma Father   . Arthritis Father   . Hypertension Brother   . Mitral valve prolapse Brother   . Glaucoma Brother   . Arthritis Brother   . Arthritis Sister    History  Substance Use Topics  . Smoking status: Never Smoker   . Smokeless tobacco: Never Used  . Alcohol Use: Yes     Comment: rare   OB History   Grav Para Term Preterm Abortions TAB SAB Ect Mult Living   1 1 1       1      Review of Systems  HENT: Positive for ear pain and sinus pressure.   Musculoskeletal: Positive for back pain.  All other systems reviewed and are  negative.   Allergies  Lipitor and Lidocaine-epinephrine  Home Medications   Prior to Admission medications   Medication Sig Start Date End Date Taking? Authorizing Provider  Cholecalciferol (VITAMIN D3) 5000 UNITS TABS Take by mouth.    Historical Provider, MD  cyclobenzaprine (FLEXERIL) 5 MG tablet Take one-half to one tab PO, 2 times daily, and two at bedtime for muscle cramps 06/19/13   Kandra Nicolas, MD  esomeprazole (NEXIUM) 40 MG capsule Take 40 mg by mouth daily at 12 noon.    Historical Provider, MD  estradiol (VIVELLE-DOT) 0.05 MG/24HR patch Place 1 patch onto the skin 2 (two) times a week.    Historical Provider, MD  fish oil-omega-3 fatty acids 1000 MG capsule Take 1 g by mouth daily.    Historical Provider, MD  ibuprofen (ADVIL,MOTRIN) 200 MG tablet Take 400 mg by mouth every 8 (eight) hours as needed for pain.     Historical Provider, MD  metoprolol succinate (TOPROL-XL) 25 MG 24 hr tablet TAKE 1 TABLET (25 MG TOTAL) BY MOUTH AT BEDTIME.    Darlin Coco, MD  Multiple Vitamin (MULTIVITAMIN WITH MINERALS) TABS tablet Take 1 tablet by mouth daily.    Historical Provider, MD  progesterone (PROMETRIUM) 100 MG capsule Take 100 mg by mouth as  directed.  06/17/13   Historical Provider, MD  promethazine (PHENERGAN) 12.5 MG tablet Take 1 tablet (12.5 mg total) by mouth every 6 (six) hours as needed for nausea or vomiting. 10/22/13   Janeann Forehand, MD  rosuvastatin (CRESTOR) 10 MG tablet Take 10 mg by mouth 2 (two) times a week.    Historical Provider, MD  Simethicone 80 MG TABS Take 1 tablet (80 mg total) by mouth every 6 (six) hours as needed. 10/22/13   Janeann Forehand, MD  thyroid (ARMOUR) 32.5 MG tablet 1 am 1pm    Historical Provider, MD  VERAMYST 27.5 MCG/SPRAY nasal spray Place into the nose as needed.  04/01/13   Historical Provider, MD   BP 99/65  Pulse 68  Temp(Src) 97.7 F (36.5 C) (Oral)  Resp 16  Ht 5\' 3"  (1.6 m)  Wt 158 lb (71.668 kg)  BMI 28.00 kg/m2   SpO2 98%  LMP 11/26/2000 Physical Exam  Nursing note and vitals reviewed. Constitutional: She is oriented to person, place, and time. She appears well-developed and well-nourished.  HENT:  Head: Normocephalic.  Right canal impacted with wax  Eyes: EOM are normal. Pupils are equal, round, and reactive to light.  Neck: Normal range of motion.  Pulmonary/Chest: Effort normal.  Abdominal: Soft. She exhibits no distension.  Musculoskeletal: Normal range of motion.  Neurological: She is alert and oriented to person, place, and time.  Skin: Skin is warm.  Psychiatric: She has a normal mood and affect.    ED Course  Procedures (including critical care time) Labs Review Labs Reviewed - No data to display  Imaging Review No results found.   MDM   1. Acute recurrent ethmoidal sinusitis   2. Cerumen impaction, right    Wax removed, tm clear   [place], PA-C 01/03/14 1833

## 2014-01-06 NOTE — ED Provider Notes (Signed)
Medical history/examination/treatment/procedure(s) were performed by non-physician provider and as supervising physician I was immediately available for consultation/collaboration.  Jacqulyn Cane, MD 01/06/14 [phone]

## 2014-02-01 ENCOUNTER — Encounter: Payer: Self-pay | Admitting: Emergency Medicine

## 2014-02-01 ENCOUNTER — Emergency Department
Admission: EM | Admit: 2014-02-01 | Discharge: 2014-02-01 | Disposition: A | Discharge status: Home / Self Care | Payer: BC Managed Care – PPO | Source: Home / Self Care | Attending: Emergency Medicine | Admitting: Emergency Medicine

## 2014-02-01 DIAGNOSIS — R1031 Right lower quadrant pain: Secondary | ICD-10-CM

## 2014-02-01 LAB — POCT CBC W AUTO DIFF (K'VILLE URGENT CARE)

## 2014-02-01 NOTE — ED Notes (Signed)
Pt c/o RLQ abdominal pain with mild nausea and frequent soft stools today. Denies fever. Pain is worse with walking.

## 2014-02-01 NOTE — ED Provider Notes (Signed)
CSN: [id number]     Arrival date & time 02/01/14  1152 History   First MD Initiated Contact with Patient 02/01/14 1207     Chief Complaint  Patient presents with  . Abdominal Pain    RLQ   (Consider location/radiation/quality/duration/timing/severity/associated sxs/prior Treatment) HPI Pt c/o 18 hours of mild, dull RLQ abdominal pain (2/10 intensity) with mild nausea and frequent soft stools today. 4 stools this morning. Without blood or mucus. Denies fever. Pain is worse with walking and bending and twisting. Recalls no injury. She states she is concerned about ruling out appendicitis. She's not sure if she ate anything unusual which may have triggered this. Has tolerated liquids and solids this morning without vomiting . Had minimal nausea but the nausea resolved. She states she has chronic "GI issues" followed by Dr. Penelope Coop her gastroenterologist . No fever or chills. No urinary symptoms. No dysuria or hematuria. No chest pain or shortness of breath No rash or lightheadedness or focal neurologic symptoms  Past Medical History  Diagnosis Date  . Hypothyroidism   . Hyperlipidemia   . SVT (supraventricular tachycardia)   . GERD (gastroesophageal reflux disease)    Past Surgical History  Procedure Laterality Date  . Cesarean section  1987  . Breast surgery  1976    CYST REMOVED  . Hysteroscopy  9.14.2012    w/myomectomy  . Left thumb surgery and carpal tunnel release  9/14   Family History  Problem Relation Age of Onset  . Hypertension Mother   . Heart disease Mother   . Mitral valve prolapse Mother   . Glaucoma Mother   . Arthritis Mother   . Hypertension Father   . Heart disease Father   . Colon polyps Father   . Glaucoma Father   . Arthritis Father   . Hypertension Brother   . Mitral valve prolapse Brother   . Glaucoma Brother   . Arthritis Brother   . Arthritis Sister    History  Substance Use Topics  . Smoking status: Never Smoker   . Smokeless tobacco:  Never Used  . Alcohol Use: Yes     Comment: rare   OB History   Grav Para Term Preterm Abortions TAB SAB Ect Mult Living   1 1 1       1      Review of Systems  All other systems reviewed and are negative.   Allergies  Lipitor and Lidocaine-epinephrine  Home Medications   Prior to Admission medications   Medication Sig Start Date End Date Taking? Authorizing Provider  esomeprazole (NEXIUM) 40 MG capsule Take 40 mg by mouth daily at 12 noon.   Yes Historical Provider, MD  Levothyroxine Sodium (SYNTHROID PO) Take 25 mcg by mouth.   Yes Historical Provider, MD  metoprolol succinate (TOPROL-XL) 25 MG 24 hr tablet TAKE 1 TABLET (25 MG TOTAL) BY MOUTH AT BEDTIME.   Yes Darlin Coco, MD  rosuvastatin (CRESTOR) 10 MG tablet Take 10 mg by mouth 2 (two) times a week.   Yes Historical Provider, MD  thyroid (ARMOUR) 32.5 MG tablet 1 am 1pm   Yes Historical Provider, MD  Cholecalciferol (VITAMIN D3) 5000 UNITS TABS Take by mouth.    Historical Provider, MD  cyclobenzaprine (FLEXERIL) 5 MG tablet Take one-half to one tab PO, 2 times daily, and two at bedtime for muscle cramps 06/19/13   Kandra Nicolas, MD  estradiol (VIVELLE-DOT) 0.05 MG/24HR patch Place 1 patch onto the skin 2 (two) times a week.  Historical Provider, MD  fish oil-omega-3 fatty acids 1000 MG capsule Take 1 g by mouth daily.    Historical Provider, MD  ibuprofen (ADVIL,MOTRIN) 200 MG tablet Take 400 mg by mouth every 8 (eight) hours as needed for pain.     Historical Provider, MD  Multiple Vitamin (MULTIVITAMIN WITH MINERALS) TABS tablet Take 1 tablet by mouth daily.    Historical Provider, MD  progesterone (PROMETRIUM) 100 MG capsule Take 100 mg by mouth as directed.  06/17/13   Historical Provider, MD  Simethicone 80 MG TABS Take 1 tablet (80 mg total) by mouth every 6 (six) hours as needed. 10/22/13   Janeann Forehand, MD  VERAMYST 27.5 MCG/SPRAY nasal spray Place into the nose as needed.  04/01/13   Historical Provider,  MD   BP 123/76  Pulse 68  Temp(Src) 98 F (36.7 C) (Oral)  Resp 14  Wt 160 lb (72.576 kg)  SpO2 98%  LMP 11/26/2000 Physical Exam  Nursing note and vitals reviewed. Constitutional: She is oriented to person, place, and time. She appears well-developed and well-nourished. No distress.  HENT:  Head: Normocephalic and atraumatic.  Mouth/Throat: Oropharynx is clear and moist. No oropharyngeal exudate.  Eyes: Conjunctivae and EOM are normal. Pupils are equal, round, and reactive to light. No scleral icterus.  Neck: Normal range of motion.  Cardiovascular: Normal rate and normal heart sounds.   Pulmonary/Chest: Effort normal and breath sounds normal. No respiratory distress.  Abdominal: Soft. She exhibits no distension. There is no hepatosplenomegaly. There is no rigidity, no rebound, no guarding, no CVA tenderness, no tenderness at McBurney's point and negative Murphy's sign.  Bowel sounds mildly hyperactive x4 . No high-pitched bowel sounds. Minimal periumbilical tenderness and minimal right lower quadrant tenderness. No masses guarding or rebound.  Musculoskeletal: Normal range of motion.  Lymphadenopathy:    She has no cervical adenopathy.  Neurological: She is alert and oriented to person, place, and time.  Skin: Skin is warm.  Psychiatric: She has a normal mood and affect.    ED Course  Procedures (including critical care time) Labs Review Labs Reviewed  POCT CBC W AUTO DIFF ([place])    Imaging Review No results found.   MDM   1. Right lower quadrant abdominal pain    CBC within normal limits. WBC 4.5, hemoglobin 13.1 Clinically, no evidence of acute abdomen or surgical abdomen.  Benign abdominal exam  Workup and Treatment options discussed, as well as risks, benefits, alternatives. We both agree that no other testing is indicated at this time. Patient voiced understanding and agreement with the following plans: Push fluids. Bland food Reassurance  regarding benign abdominal exam and normal CBC. She declined any anti-spasmodic or pain medication. Follow-up with your GI in 2-3 days, or sooner if symptoms become worse. Precautions discussed. Red flags discussed.--Emergency room if any red flag Questions invited and answered. Patient voiced understanding and agreement.       Jacqulyn Cane, MD 02/01/14 [phone]

## 2014-02-04 ENCOUNTER — Ambulatory Visit
Admission: RE | Admit: 2014-02-04 | Discharge: 2014-02-04 | Disposition: A | Discharge status: Home / Self Care | Payer: BC Managed Care – PPO | Source: Ambulatory Visit | Attending: Gastroenterology | Admitting: Gastroenterology

## 2014-02-04 DIAGNOSIS — K869 Disease of pancreas, unspecified: Secondary | ICD-10-CM

## 2014-02-04 MED ORDER — IOHEXOL 300 MG/ML  SOLN
100.0000 mL | Freq: Once | INTRAMUSCULAR | Status: AC | PRN
  Administered 2014-02-04: 100 mL via INTRAVENOUS

## 2014-02-05 ENCOUNTER — Other Ambulatory Visit: Payer: Self-pay | Admitting: Cardiology

## 2014-02-14 ENCOUNTER — Telehealth: Payer: Self-pay | Admitting: Cardiology

## 2014-02-14 NOTE — Telephone Encounter (Signed)
Spoke with patient and she had her thyroid medication increased 4-6 weeks ago by her endocrinologist (Armour thyroid went from 32.5 mg once daily to twice a day and synthroid 0.25).  Patient started with increase heart rate of 112-120, has woken her up from her sleep and lightheaded for a few seconds Spoke with Endocrinology office today and last dose of Synthroid in am (that will be last dose then d/c) and go for labs 5 hours afterward. Patients systolic blood pressure ranges from 96-110 Discussed with  Dr. Mare Ferrari and he feels secondary to thyroid medication increase. Ok to use Metoprolol 1/4-1/2 tablet as needed for symptomatic elevated heart rate.  Advised patient Will call back if no better

## 2014-02-14 NOTE — Telephone Encounter (Signed)
New message          C/o heart rate up last few days / is this because of my thyroid meds? / do I need an ekg?

## 2014-02-21 ENCOUNTER — Encounter: Payer: Self-pay | Admitting: Emergency Medicine

## 2014-02-25 ENCOUNTER — Telehealth: Payer: Self-pay | Admitting: Cardiology

## 2014-02-25 ENCOUNTER — Encounter: Payer: Self-pay | Admitting: Cardiology

## 2014-02-25 DIAGNOSIS — Z8679 Personal history of other diseases of the circulatory system: Secondary | ICD-10-CM | POA: Insufficient documentation

## 2014-02-25 NOTE — Telephone Encounter (Signed)
Called & states neighbor took her to the firestation & did a 12 lead ekg.  PA reviewed & states ekg looked a little different. I am still tired but feel much better 102/68.  States they are going to send me to see a second opinion endocrinologist She is bringing in her ekg from the [place] office.  Horton Chin RN

## 2014-02-25 NOTE — Telephone Encounter (Signed)
Did we make a copy of the EKG she brought in so I can see it sometime?

## 2014-02-25 NOTE — Telephone Encounter (Signed)
I spoke with pt at length. She was awakened this morning at 2:30am with elevated heart rate of 134 & bp 142/100.  States she took an extra 1/2 tablet of metoprolol succinate. States she felt dizzy afterwards.  Heart rate now in the 80s according to pt but she is still feeling weak. Denies any chest pressure, angina, chest pain. States she is a little short of breath. She is home alone.  I placed pt on hold to look for her an appt with Cecille Rubin today. Upon talking with pt less than 3 minutes later, pt states that she used her pulse ox & that her heart beat seems to be irregular & she does not feel good. She is home alone & her daughter is in school until 11 am per pt.  Have advised pt to call 911 now to be evaluated. She agrees with this.   Reassurance given  Horton Chin RN

## 2014-02-25 NOTE — Telephone Encounter (Signed)
Ekg received & reviewed by Dr. Ron Parker. No acute abnormalities. EKg placed on Dr. Sherryl Barters desk  Horton Chin RN

## 2014-02-25 NOTE — Telephone Encounter (Signed)
Walk in pt Form " EKG Dropped Off By pt "

## 2014-02-25 NOTE — Telephone Encounter (Signed)
Thanks

## 2014-02-25 NOTE — Telephone Encounter (Signed)
New message     patient calling C/O pulse rate & blood pressure up  . Feeling weak this am . 142/100, 88-134 .

## 2014-02-28 ENCOUNTER — Emergency Department (INDEPENDENT_AMBULATORY_CARE_PROVIDER_SITE_OTHER): Payer: BC Managed Care – PPO

## 2014-02-28 ENCOUNTER — Encounter: Payer: Self-pay | Admitting: *Deleted

## 2014-02-28 ENCOUNTER — Other Ambulatory Visit: Payer: Self-pay | Admitting: Physician Assistant

## 2014-02-28 ENCOUNTER — Emergency Department (INDEPENDENT_AMBULATORY_CARE_PROVIDER_SITE_OTHER)
Admission: EM | Admit: 2014-02-28 | Discharge: 2014-02-28 | Disposition: A | Discharge status: Home / Self Care | Payer: BC Managed Care – PPO | Source: Home / Self Care | Attending: Emergency Medicine | Admitting: Emergency Medicine

## 2014-02-28 DIAGNOSIS — R195 Other fecal abnormalities: Secondary | ICD-10-CM

## 2014-02-28 DIAGNOSIS — R52 Pain, unspecified: Secondary | ICD-10-CM

## 2014-02-28 DIAGNOSIS — K5900 Constipation, unspecified: Secondary | ICD-10-CM

## 2014-02-28 LAB — POCT CBC W AUTO DIFF (K'VILLE URGENT CARE)

## 2014-02-28 MED ORDER — POLYETHYLENE GLYCOL 3350 17 G PO PACK: 17.0000 g | PACK | Freq: Every day | ORAL | Status: DC

## 2014-02-28 NOTE — ED Provider Notes (Signed)
CSN: [id number]     Arrival date & time 02/28/14  1522 History   First MD Initiated Contact with Patient 02/28/14 1522     Chief Complaint  Patient presents with  . Abdominal Pain  . Nausea   (Consider location/radiation/quality/duration/timing/severity/associated sxs/prior Treatment) HPI Comments: Patient presents to urgent care complaining of abdominal pain with bloating and nausea. Patient reports that she has had a 8 pound weight loss over the last 6 weeks. Patient informs me she has had a long history of GI problems she is followed by Dr. Penelope Coop. 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 Patient reports she had a bowel movement today but feels constipated. She called her GI doctors office today and was told to come here to urgent care for laboratory evaluation and possible x-ray and then to scheduled to see Dr. Penelope Coop for recheck. Patient also reports she has a history of SVT. She felt like she was in SVT yesterday. She took a half dosage of her metoprolol and symptoms have resolved she reports her heart rate has been normal today. Her cardiologist recently decreased her Synthroid which she reports has decreased her episodes of SVT.   Patient is a 63 y.o. female presenting with abdominal pain. The history is provided by the patient.  Abdominal Pain Associated symptoms: constipation     Past Medical History  Diagnosis Date  . Hypothyroidism   . Hyperlipidemia   . SVT (supraventricular tachycardia)   . GERD (gastroesophageal reflux disease)    Past Surgical History  Procedure Laterality Date  . Cesarean section  1987  . Breast surgery  1976    CYST REMOVED  . Hysteroscopy  9.14.2012    w/myomectomy  . Left thumb surgery and carpal tunnel release  9/14   Family History  Problem Relation Age of Onset  . Hypertension Mother   . Heart disease Mother   . Mitral valve prolapse Mother   . Glaucoma Mother   .  Arthritis Mother   . Hypertension Father   . Heart disease Father   . Colon polyps Father   . Glaucoma Father   . Arthritis Father   . Hypertension Brother   . Mitral valve prolapse Brother   . Glaucoma Brother   . Arthritis Brother   . Arthritis Sister    History  Substance Use Topics  . Smoking status: Never Smoker   . Smokeless tobacco: Never Used  . Alcohol Use: Yes     Comment: rare   OB History    Gravida Para Term Preterm AB TAB SAB Ectopic Multiple Living   1 1 1       1      Review of Systems  Gastrointestinal: Positive for abdominal pain and constipation.  All other systems reviewed and are negative.   Allergies  Lipitor and Lidocaine-epinephrine  Home Medications   Prior to Admission medications   Medication Sig Start Date End Date Taking? Authorizing Provider  Cholecalciferol (VITAMIN D3) 5000 UNITS TABS Take by mouth.    Historical Provider, MD  cyclobenzaprine (FLEXERIL) 5 MG tablet Take one-half to one tab PO, 2 times daily, and two at bedtime for muscle cramps 06/19/13   Kandra Nicolas, MD  esomeprazole (NEXIUM) 40 MG capsule Take 40 mg by mouth daily at 12 noon.    Historical Provider, MD  estradiol (VIVELLE-DOT) 0.05 MG/24HR patch Place 1 patch onto the skin 2 (  two) times a week.    Historical Provider, MD  fish oil-omega-3 fatty acids 1000 MG capsule Take 1 g by mouth daily.    Historical Provider, MD  ibuprofen (ADVIL,MOTRIN) 200 MG tablet Take 400 mg by mouth every 8 (eight) hours as needed for pain.     Historical Provider, MD  Levothyroxine Sodium (SYNTHROID PO) Take 25 mcg by mouth.    Historical Provider, MD  metoprolol succinate (TOPROL-XL) 25 MG 24 hr tablet TAKE 1 TABLET (25 MG TOTAL) BY MOUTH AT BEDTIME. 02/07/14   Darlin Coco, MD  Multiple Vitamin (MULTIVITAMIN WITH MINERALS) TABS tablet Take 1 tablet by mouth daily.    Historical Provider, MD  polyethylene glycol (MIRALAX) packet Take 17 g by mouth daily. 02/28/14   Fransico Meadow, PA-C   progesterone (PROMETRIUM) 100 MG capsule Take 100 mg by mouth as directed.  06/17/13   Historical Provider, MD  rosuvastatin (CRESTOR) 10 MG tablet Take 10 mg by mouth 2 (two) times a week.    Historical Provider, MD  Simethicone 80 MG TABS Take 1 tablet (80 mg total) by mouth every 6 (six) hours as needed. 10/22/13   Janeann Forehand, MD  thyroid (ARMOUR) 32.5 MG tablet 1 am 1pm    Historical Provider, MD  VERAMYST 27.5 MCG/SPRAY nasal spray Place into the nose as needed.  04/01/13   Historical Provider, MD   BP 122/79 mmHg  Pulse 79  Temp(Src) 98.2 F (36.8 C) (Oral)  Resp 14  Wt 156 lb 1.9 oz (70.816 kg)  SpO2 100%  LMP 11/26/2000 Physical Exam  Constitutional: She is oriented to person, place, and time. She appears well-developed and well-nourished.  HENT:  Head: Normocephalic and atraumatic.  Eyes: Conjunctivae and EOM are normal. Pupils are equal, round, and reactive to light.  Neck: Normal range of motion. Neck supple.  Cardiovascular: Normal rate and normal heart sounds.   Pulmonary/Chest: Effort normal and breath sounds normal.  Abdominal: Soft. Bowel sounds are normal.  Musculoskeletal: Normal range of motion.  Neurological: She is alert and oriented to person, place, and time. She has normal reflexes.  Skin: Skin is warm.  Psychiatric: She has a normal mood and affect.  Nursing note and vitals reviewed.   ED Course  Procedures (including critical care time) Labs Review Labs Reviewed  COMPREHENSIVE METABOLIC PANEL  LIPASE, BLOOD  POCT CBC W AUTO DIFF ([facility])    Imaging Review Dg Abd 1 View  02/28/2014   CLINICAL DATA:  Right upper quadrant pain and occasional constipation for 1 week.  EXAM: ABDOMEN - 1 VIEW  COMPARISON:  CT of the abdomen and pelvis on 02/04/2014  FINDINGS: Bowel gas pattern is nonobstructive. There is moderate stool throughout nondilated loops of colon. No organomegaly evident. There are scattered phleboliths. Visualized osseous  structures have a normal appearance.  IMPRESSION: Moderate stool burden.   Electronically Signed   By: Shon Hale M.D.   On: 02/28/2014 16:02     MDM  UA and CBC are unremarkable seem that is pending. Labs would not cross into my dictation due to Epic downtime. Patient's KUB shows moderate constipation which is consistent with patient's history.   I advised patient she needs to talk to Dr. Penelope Coop regarding her weight loss and abnormality seen on her CT of her pancreas. Patient is given a prescription for Mira lax. She is advised to follow-up with her primary care provider and specialist as needed   1. Constipation, unspecified constipation type  2. Pain   3. Pain    Avs Mira lax    Fransico Meadow, [place] 02/28/14 [phone]

## 2014-02-28 NOTE — ED Notes (Signed)
[name] c/o RUQ pain/pressure with bloating and nausea intermittent x 1 year. Her pressure is normally relieved by belching, gas and BM. Today no relief has come. Stools today are formed.

## 2014-02-28 NOTE — Discharge Instructions (Signed)

## 2014-03-01 LAB — COMPREHENSIVE METABOLIC PANEL
ALT: 18 U/L (ref 0–35)
AST: 18 U/L (ref 0–37)
Albumin: 4.8 g/dL (ref 3.5–5.2)
Alkaline Phosphatase: 88 U/L (ref 39–117)
BUN: 17 mg/dL (ref 6–23)
CO2: 29 mEq/L (ref 19–32)
Calcium: 10.7 mg/dL — ABNORMAL HIGH (ref 8.4–10.5)
Chloride: 105 mEq/L (ref 96–112)
Creat: 0.78 mg/dL (ref 0.50–1.10)
Glucose, Bld: 103 mg/dL — ABNORMAL HIGH (ref 70–99)
Potassium: 4.5 mEq/L (ref 3.5–5.3)
Sodium: 145 mEq/L (ref 135–145)
Total Bilirubin: 0.4 mg/dL (ref 0.2–1.2)
Total Protein: 7.4 g/dL (ref 6.0–8.3)

## 2014-03-01 LAB — LIPASE: Lipase: 36 U/L (ref 0–75)

## 2014-03-02 ENCOUNTER — Encounter (HOSPITAL_COMMUNITY): Payer: Self-pay | Admitting: Emergency Medicine

## 2014-03-02 ENCOUNTER — Emergency Department (HOSPITAL_COMMUNITY)
Admission: EM | Admit: 2014-03-02 | Discharge: 2014-03-02 | Disposition: A | Discharge status: Home / Self Care | Payer: BC Managed Care – PPO | Attending: Emergency Medicine | Admitting: Emergency Medicine

## 2014-03-02 ENCOUNTER — Emergency Department (HOSPITAL_COMMUNITY): Payer: BC Managed Care – PPO

## 2014-03-02 DIAGNOSIS — E785 Hyperlipidemia, unspecified: Secondary | ICD-10-CM | POA: Insufficient documentation

## 2014-03-02 DIAGNOSIS — Z79818 Long term (current) use of other agents affecting estrogen receptors and estrogen levels: Secondary | ICD-10-CM | POA: Insufficient documentation

## 2014-03-02 DIAGNOSIS — R531 Weakness: Secondary | ICD-10-CM | POA: Diagnosis present

## 2014-03-02 DIAGNOSIS — E039 Hypothyroidism, unspecified: Secondary | ICD-10-CM | POA: Insufficient documentation

## 2014-03-02 DIAGNOSIS — K219 Gastro-esophageal reflux disease without esophagitis: Secondary | ICD-10-CM | POA: Diagnosis not present

## 2014-03-02 DIAGNOSIS — Z79899 Other long term (current) drug therapy: Secondary | ICD-10-CM | POA: Diagnosis not present

## 2014-03-02 DIAGNOSIS — R42 Dizziness and giddiness: Secondary | ICD-10-CM | POA: Insufficient documentation

## 2014-03-02 DIAGNOSIS — R1013 Epigastric pain: Secondary | ICD-10-CM | POA: Diagnosis not present

## 2014-03-02 DIAGNOSIS — R002 Palpitations: Secondary | ICD-10-CM

## 2014-03-02 DIAGNOSIS — Z8639 Personal history of other endocrine, nutritional and metabolic disease: Secondary | ICD-10-CM

## 2014-03-02 LAB — CBC WITH DIFFERENTIAL/PLATELET
Basophils Absolute: 0 10*3/uL (ref 0.0–0.1)
Basophils Relative: 0 % (ref 0–1)
Eosinophils Absolute: 0 10*3/uL (ref 0.0–0.7)
Eosinophils Relative: 0 % (ref 0–5)
HCT: 38.8 % (ref 36.0–46.0)
Hemoglobin: 13.7 g/dL (ref 12.0–15.0)
Lymphocytes Relative: 26 % (ref 12–46)
Lymphs Abs: 1.7 10*3/uL (ref 0.7–4.0)
MCH: 28.8 pg (ref 26.0–34.0)
MCHC: 35.3 g/dL (ref 30.0–36.0)
MCV: 81.5 fL (ref 78.0–100.0)
Monocytes Absolute: 0.4 10*3/uL (ref 0.1–1.0)
Monocytes Relative: 6 % (ref 3–12)
Neutro Abs: 4.5 10*3/uL (ref 1.7–7.7)
Neutrophils Relative %: 68 % (ref 43–77)
Platelets: 150 10*3/uL (ref 150–400)
RBC: 4.76 MIL/uL (ref 3.87–5.11)
RDW: 14.3 % (ref 11.5–15.5)
WBC: 6.6 10*3/uL (ref 4.0–10.5)

## 2014-03-02 LAB — COMPREHENSIVE METABOLIC PANEL
ALT: 19 U/L (ref 0–35)
AST: 19 U/L (ref 0–37)
Albumin: 4.3 g/dL (ref 3.5–5.2)
Alkaline Phosphatase: 93 U/L (ref 39–117)
Anion gap: 13 (ref 5–15)
BUN: 14 mg/dL (ref 6–23)
CO2: 26 mEq/L (ref 19–32)
Calcium: 10.6 mg/dL — ABNORMAL HIGH (ref 8.4–10.5)
Chloride: 106 mEq/L (ref 96–112)
Creatinine, Ser: 0.8 mg/dL (ref 0.50–1.10)
GFR calc Af Amer: >90 mL/min (ref >90)
GFR calc non Af Amer: 78 mL/min — ABNORMAL LOW (ref >90)
Glucose, Bld: 105 mg/dL — ABNORMAL HIGH (ref 70–99)
Potassium: 3.5 mEq/L — ABNORMAL LOW (ref 3.7–5.3)
Sodium: 145 mEq/L (ref 137–147)
Total Bilirubin: 0.3 mg/dL (ref 0.3–1.2)
Total Protein: 7.6 g/dL (ref 6.0–8.3)

## 2014-03-02 LAB — I-STAT TROPONIN, ED: Troponin i, poc: 0.01 ng/mL (ref 0.00–0.08)

## 2014-03-02 LAB — TSH: TSH: 0.174 u[IU]/mL — ABNORMAL LOW (ref 0.350–4.500)

## 2014-03-02 LAB — LIPASE, BLOOD: Lipase: 34 U/L (ref 11–59)

## 2014-03-02 NOTE — ED Notes (Signed)
Bed: WA09 Expected date:  Expected time:  Means of arrival:  Comments: EMS- 63yo F, lightheaded x 1 week

## 2014-03-02 NOTE — ED Notes (Addendum)
Per EMS pt drove herself to fire station where she was c/o weakness, lightheadedness she is thinking that it is related to her thyroid problem. Pt states that she has seen her PCP, cardiologist, and urgent care.  Pt CBG 134 per EMS.  Pt states that she has been waking up for past week with palpitations, heartburn around 2-3am. Pt was treated for constipation.

## 2014-03-02 NOTE — ED Provider Notes (Signed)
CSN: [id number]     Arrival date & time 03/02/14  1440 History   First MD Initiated Contact with Patient 03/02/14 1521     Chief Complaint  Patient presents with  . Weakness  . Dizziness     (Consider location/radiation/quality/duration/timing/severity/associated sxs/prior Treatment) HPI Comments: The patient is a 63 year old female with a past medical history of Hypothyroidism SVT s/p ablation, Refulx repesnting the emergency department with a chief compliant of palpations for approximately 5 days.  She reports 4 episodes of palpations waking her up at night. Patient reports one episode today while driving.  She reports associated lightheadedness without syncope. Patient reports some relief of symptoms with antianxiety medication last night.Patient was told to stop taking synthroid 83mcg 1.5 weeks ago. She reports compliance with Arnour. She denis amphetamine, cocaine, IV drug, EtOH use. She reports moderate resolution of constipation and nausea and bloating after being evaluated 11/9. She reports mild epigastric discomfort, persistent for over a year. Reports mild central chest discomfort described as burning but states "no real chest pain".  The history is provided by the patient. No language interpreter was used.    Past Medical History  Diagnosis Date  . Hypothyroidism   . Hyperlipidemia   . SVT (supraventricular tachycardia)   . GERD (gastroesophageal reflux disease)    Past Surgical History  Procedure Laterality Date  . Cesarean section  1987  . Breast surgery  1976    CYST REMOVED  . Hysteroscopy  9.14.2012    w/myomectomy  . Left thumb surgery and carpal tunnel release  9/14   Family History  Problem Relation Age of Onset  . Hypertension Mother   . Heart disease Mother   . Mitral valve prolapse Mother   . Glaucoma Mother   . Arthritis Mother   . Hypertension Father   . Heart disease Father   . Colon polyps Father   . Glaucoma Father   . Arthritis Father   .  Hypertension Brother   . Mitral valve prolapse Brother   . Glaucoma Brother   . Arthritis Brother   . Arthritis Sister    History  Substance Use Topics  . Smoking status: Never Smoker   . Smokeless tobacco: Never Used  . Alcohol Use: Yes     Comment: rare   OB History    Gravida Para Term Preterm AB TAB SAB Ectopic Multiple Living   1 1 1       1      Review of Systems  Constitutional: Negative for fever and chills.  Respiratory: Negative for shortness of breath.   Cardiovascular: Positive for palpitations. Negative for chest pain and leg swelling.  Gastrointestinal: Positive for abdominal pain. Negative for nausea.  Neurological: Positive for light-headedness. Negative for dizziness, syncope, weakness, numbness and headaches.      Allergies  Lipitor and Lidocaine-epinephrine  Home Medications   Prior to Admission medications   Medication Sig Start Date End Date Taking? Authorizing Provider  Cholecalciferol (VITAMIN D3) 5000 UNITS TABS Take 1 tablet by mouth daily.    Yes Historical Provider, MD  esomeprazole (NEXIUM) 40 MG capsule Take 40 mg by mouth daily at 12 noon.   Yes Historical Provider, MD  fish oil-omega-3 fatty acids 1000 MG capsule Take 1 g by mouth daily.   Yes Historical Provider, MD  ibuprofen (ADVIL,MOTRIN) 200 MG tablet Take 400 mg by mouth every 8 (eight) hours as needed for pain.    Yes Historical Provider, MD  metoprolol succinate (TOPROL-XL)  25 MG 24 hr tablet Take 25 mg by mouth every evening.   Yes Historical Provider, MD  Multiple Vitamin (MULTIVITAMIN WITH MINERALS) TABS tablet Take 1 tablet by mouth daily.   Yes Historical Provider, MD  polyethylene glycol (MIRALAX) packet Take 17 g by mouth daily. 02/28/14  Yes Hollace Kinnier Sofia, PA-C  rosuvastatin (CRESTOR) 10 MG tablet Take 10 mg by mouth 3 (three) times a week. Every Monday, Wednesday, and Friday in the evenings.   Yes Historical Provider, MD  thyroid (ARMOUR) 32.5 MG tablet Take 65 mg by mouth  every morning.    Yes Historical Provider, MD  VERAMYST 27.5 MCG/SPRAY nasal spray Place 1 spray into the nose as needed.  04/01/13  Yes Historical Provider, MD  cyclobenzaprine (FLEXERIL) 5 MG tablet Take one-half to one tab PO, 2 times daily, and two at bedtime for muscle cramps 06/19/13   Kandra Nicolas, MD  estradiol (VIVELLE-DOT) 0.05 MG/24HR patch Place 1 patch onto the skin 2 (two) times a week.    Historical Provider, MD  Levothyroxine Sodium (SYNTHROID PO) Take 25 mcg by mouth.    Historical Provider, MD  progesterone (PROMETRIUM) 100 MG capsule Take 100 mg by mouth as directed.  06/17/13   Historical Provider, MD  Simethicone 80 MG TABS Take 1 tablet (80 mg total) by mouth every 6 (six) hours as needed. 10/22/13   Janeann Forehand, MD   BP 117/83 mmHg  Pulse 72  Resp 20  SpO2 99%  LMP 11/26/2000 Physical Exam  Constitutional: She is oriented to person, place, and time. She appears well-developed and well-nourished. No distress.  HENT:  Head: Normocephalic and atraumatic.  Right Ear: Tympanic membrane and external ear normal.  Left Ear: Tympanic membrane and external ear normal.  Mouth/Throat: No oropharyngeal exudate.  Eyes: EOM are normal. Pupils are equal, round, and reactive to light.  Neck: Normal range of motion. Neck supple.  Cardiovascular: Normal rate and regular rhythm.   No lower extremity edema  Pulmonary/Chest: Effort normal and breath sounds normal. She has no wheezes. She has no rales.  Abdominal: Soft. There is tenderness in the epigastric area. There is no rigidity, no rebound and no guarding.  Minimal epigastric discomfort.  Neurological: She is alert and oriented to person, place, and time. No cranial nerve deficit or sensory deficit. She exhibits normal muscle tone. GCS eye subscore is 4. GCS verbal subscore is 5. GCS motor subscore is 6.  Speech is clear and goal oriented, follows commands II-Visual fields were normal.   III/IV/VI-Pupils were equal and  reacted. Extraocular movements were full and conjugate.  V/VII-no facial droop.   VIII-normal.   Motor: Strength 5/5 to upper and lower extremities bilaterally. Moves all 4 extremities equally. Sensory: normal sensation to upper and lower extremities.  Cerebellar: Normal finger to nose bilaterally No pronator drift.  Skin: Skin is warm and dry.  Psychiatric: She has a normal mood and affect. Her behavior is normal.  Nursing note and vitals reviewed.   ED Course  Procedures (including critical care time) Labs Review Labs Reviewed  TSH - Abnormal; Notable for the following:    TSH 0.174 (*)    All other components within normal limits  COMPREHENSIVE METABOLIC PANEL - Abnormal; Notable for the following:    Potassium 3.5 (*)    Glucose, Bld 105 (*)    Calcium 10.6 (*)    GFR calc non Af Amer 78 (*)    All other components within normal limits  CBC WITH DIFFERENTIAL  LIPASE, BLOOD  I-STAT TROPOININ, ED    Imaging Review Dg Abd 1 View  02/28/2014   CLINICAL DATA:  Right upper quadrant pain and occasional constipation for 1 week.  EXAM: ABDOMEN - 1 VIEW  COMPARISON:  CT of the abdomen and pelvis on 02/04/2014  FINDINGS: Bowel gas pattern is nonobstructive. There is moderate stool throughout nondilated loops of colon. No organomegaly evident. There are scattered phleboliths. Visualized osseous structures have a normal appearance.  IMPRESSION: Moderate stool burden.   Electronically Signed   By: Shon Hale M.D.   On: 02/28/2014 16:02     EKG Interpretation None      MDM   Final diagnoses:  Palpitations  History of thyroid disease   Patient presents with palpitations and lightheadedness ongoing for approximately one week. EKG without concerning abnormalities. No neurologic deficits on exam, patient in normal sinus rhythm at this time, and no lower extremity edema, no shortness breath. CBC and CMP without concerning abnormalities. TSH low at 0.174, patient has follow-up with  endocrinologist as outpatient for further evaluation given history of thyroid disorder and recent medication change. Discuss increasing metoprolol to 50mg  daily to reduce palpitations, pt reports increase in lightheadedness with increasing metoprolol, discussed 1/2 or 1/4 medication. Follow up with cardiologist for holter monitoring.  Discussed lab results, imaging results, and treatment plan with the patient. Return precautions given. Reports understanding and no other concerns at this time.  Patient is stable for discharge at this time.    Harvie Heck, PA-C 03/03/14 [place] Alvino Chapel, MD 03/07/14 1434

## 2014-03-02 NOTE — Discharge Instructions (Signed)
Call your cardiologist for further evaluation of your palpitations and possible monitoring for arrhythmias. Call an endocrinologist for further evaluation of your hypothyroidism. Call for a follow up appointment with a Family or Primary Care Provider.  Return if Symptoms worsen.   Take medication as prescribed.  Take metoprolol 25 mg as prescribed, you can increase to up to Toprol 50 mg daily for palpitations.

## 2014-03-03 NOTE — Telephone Encounter (Signed)
New message ° ° ° ° ° °Talk to Melinda.  Pt would not tell me what she wanted °

## 2014-03-03 NOTE — Telephone Encounter (Signed)
Patient seen in ED last night for elevated heart rate.  Patient is having elevated heart rate mostly at night but yesterday even during the day Scheduled ov for patient to be seen tomorrow, advised patient

## 2014-03-04 ENCOUNTER — Ambulatory Visit (INDEPENDENT_AMBULATORY_CARE_PROVIDER_SITE_OTHER): Payer: BC Managed Care – PPO | Admitting: Cardiology

## 2014-03-04 ENCOUNTER — Encounter: Payer: Self-pay | Admitting: Cardiology

## 2014-03-04 VITALS — BP 112/70 | HR 61 | Ht 63.0 in | Wt 159.0 lb

## 2014-03-04 DIAGNOSIS — I471 Supraventricular tachycardia, unspecified: Secondary | ICD-10-CM

## 2014-03-04 DIAGNOSIS — I119 Hypertensive heart disease without heart failure: Secondary | ICD-10-CM

## 2014-03-04 DIAGNOSIS — E038 Other specified hypothyroidism: Secondary | ICD-10-CM

## 2014-03-04 DIAGNOSIS — E876 Hypokalemia: Secondary | ICD-10-CM

## 2014-03-04 MED ORDER — POTASSIUM CHLORIDE ER 10 MEQ PO TBCR: EXTENDED_RELEASE_TABLET | ORAL | Status: DC

## 2014-03-04 NOTE — Assessment & Plan Note (Signed)
She is currently taking Armour Thyroid.  She has an appointment for evaluation with endocrinology next week.

## 2014-03-04 NOTE — Patient Instructions (Signed)
ADD KDUR 10 MEQ 2 DAILY  Your physician has recommended that you wear an event monitor. Event monitors are medical devices that record the heart's electrical activity. Doctors most often [place] these monitors to diagnose arrhythmias. Arrhythmias are problems with the speed or rhythm of the heartbeat. The monitor is a small, portable device. You can wear one while you do your normal daily activities. This is usually used to diagnose what is causing palpitations/syncope (passing out). [place] physician recommends that you schedule a follow-up appointment in: 3 MONTH OV/BMET/MAG LEVEL

## 2014-03-04 NOTE — Assessment & Plan Note (Signed)
Several days ago at her emergency room visit she was noted to be hypokalemic.  Potassium was 3.5.  She is not on any potassium repletion.  We will add K-Dur 10 milliequivalent 2 tablets daily

## 2014-03-04 NOTE — Assessment & Plan Note (Signed)
The patient continues to have intermittent complaints of heart racing.  She states that typically this will occur between 2 and 4 AM.  Sometimes it is associated with symptoms of nausea.  In an effort to confirm or document her arrhythmia, we will have her wear a 30 day event monitor.

## 2014-03-04 NOTE — Progress Notes (Signed)
[place] Date of Birth:  [birth year] [address] [place], [place]  [address] [phone]         Fax   [phone]  History of Present Illness: This 63 year old woman was originally seen at the request of Dr. Theadore Nan.  She returns now for a scheduled six-month follow-up office visit.  She has a past history of essential hypertension and a history of palpitations and tachycardia. She is being seen for followup of tachycardia.  She gives an interesting cardiac history.  She has had a long history of paroxysmal supraventricular tachycardia.  She states that she had an ablation for SVT at [facility] in 2001.  Since then she has had infrequent episodes of SVT.  Many of her episodes have occurred following surgical procedures.  In October 2014 she underwent left arm carpal tunnel surgery with tendon transfer [facility] by Dr. Burney Gauze.  She was monitored during the operation and apparently had no arrhythmia.   she has had an echocardiogram on 02/02/13 which showed normal left ventricular function with ejection fraction 55-60% and no significant abnormalities. The patient wore a 30 day event monitor in October 2014.  This showed only normal sinus rhythm and there were no episodes of SVT noted even though she thought that she had felt some episodes. Since we last saw her she is now being treated for Hashimoto's thyroiditis by her PCP.she has had a difficult time getting her thyroid hormone levels regulated.  She has seen to endocrinologist and has an appointment to see a third endocrinologist next week.  She has had several recent emergency room visits for complaints of palpitations.  No objective arrhythmia has been confirmed however. Current Outpatient Prescriptions  Medication Sig Dispense Refill  . Cholecalciferol (VITAMIN D3) 5000 UNITS TABS Take 1 tablet by mouth daily.     [name] esomeprazole (NEXIUM) 40 MG capsule Take 40 mg  by mouth daily at 12 noon.    . fish oil-omega-3 fatty acids 1000 MG capsule Take 1 g by mouth daily.    . metoprolol succinate (TOPROL-XL) 25 MG 24 hr tablet Take 25 mg by mouth every evening.    . Multiple Vitamin (MULTIVITAMIN WITH MINERALS) TABS tablet Take 1 tablet by mouth daily.    . polyethylene glycol (MIRALAX) packet Take 17 g by mouth daily. 14 each 0  . rosuvastatin (CRESTOR) 10 MG tablet Take 10 mg by mouth 3 (three) times a week. Every Monday, Wednesday, and Friday in the evenings.    [name] thyroid (ARMOUR) 32.5 MG tablet Take 65 mg by mouth every morning.     . VERAMYST 27.5 MCG/SPRAY nasal spray Place 1 spray into the nose as needed.     . potassium chloride (K-DUR) 10 MEQ tablet 2 TABLETS DAILY 180 tablet 3   No current facility-administered medications for this visit.    Allergies  Allergen Reactions  . Epinephrine   . Lipitor [Atorvastatin]     Muscle aches  . Lidocaine-Epinephrine [Lidocaine-Epinephrine] Palpitations    Patient Active Problem List   Diagnosis Date Noted  . Hypokalemia 03/04/2014  . Osteopenia 04/30/2013  . Thyroiditis 03/29/2013  . 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 Hypothyroidism 04/27/2012  . Hyperlipidemia 04/27/2012  . SVT (supraventricular tachycardia) 04/27/2012  . Endometrial polyp 12/04/2010  . Fibroid 12/04/2010  . Fibroid, uterus 11/27/2010    History  Smoking status  . Never Smoker   Smokeless tobacco  .  Never Used    History  Alcohol Use  . Yes    Comment: rare    Family History  Problem Relation Age of Onset  . Hypertension Mother   . Heart disease Mother   . Mitral valve prolapse Mother   . Glaucoma Mother   . Arthritis Mother   . Hypertension Father   . Heart disease Father   . Colon polyps Father   . Glaucoma Father   . Arthritis Father   . Hypertension Brother   . Mitral valve prolapse Brother   . Glaucoma Brother   . Arthritis Brother   . Arthritis Sister     Review  of Systems: Constitutional: no fever chills diaphoresis or fatigue or change in weight.  Head and neck: no hearing loss, no epistaxis, no photophobia or visual disturbance. Respiratory: No cough, shortness of breath or wheezing. Cardiovascular: No chest pain peripheral edema, positive for palpitations with sudden onset and offset Gastrointestinal: No abdominal distention, no abdominal pain, she has had some recent loose stools and increasing intestinal gas which she attributes to possible gallbladder problems Genitourinary: No dysuria, no frequency, no urgency, no nocturia. Musculoskeletal:No arthralgias, no back pain, no gait disturbance or myalgias. Neurological: No dizziness, no headaches, no numbness, no seizures, no syncope, no weakness, no tremors. Hematologic: No lymphadenopathy, no easy bruising. Psychiatric: No confusion, no hallucinations, no sleep disturbance.    Physical Exam: Filed Vitals:   03/04/14 0949  BP: 112/70  Pulse: 61   the general appearance reveals a well-developed well-nourished alert woman in no acute distress.The head and neck exam reveals pupils equal and reactive.  Extraocular movements are full.  There is no scleral icterus.  The mouth and pharynx are normal.  The neck is supple.  The carotids reveal no bruits.  The jugular venous pressure is normal.  The  thyroid is not enlarged.  There is no lymphadenopathy.  The chest is clear to percussion and auscultation.  There are no rales or rhonchi.  Expansion of the chest is symmetrical.  The precordium is quiet.  The first heart sound is normal.  The second heart sound is physiologically split.  There is no murmur gallop rub or click.  There is no abnormal lift or heave.  The abdomen is soft and nontender.  The bowel sounds are normal.  The liver and spleen are not enlarged.  There are no abdominal masses.  There are no abdominal bruits.  Extremities reveal good pedal pulses.  There is no phlebitis or edema.  There is no  cyanosis or clubbing.  Strength is normal and symmetrical in all extremities.  There is no lateralizing weakness.  There are no sensory deficits.  The skin is warm and dry.  There is no rash  EKG today shows normal sinus rhythm and is normal.  EKG which patient brought earlier dated 02/25/14 was also normal  Assessment / Plan:  1. Palpitations 2.  Past history of paroxysmal supraventricular tachycardia treated with ablation at [facility] in 2001. 3.  Hypokalemia 4.  History of Hashimoto's thyroiditis 5.  Postmenopausal symptoms  Disposition: Start K-Dur 10 milliequivalents 2 tablets daily. Return for 30 day event monitor. Keep appointment with endocrinologist next week regarding thyroid. Return here in 3 months for office visit basal metabolic panel and serum magnesium level.

## 2014-03-05 ENCOUNTER — Encounter (HOSPITAL_BASED_OUTPATIENT_CLINIC_OR_DEPARTMENT_OTHER): Payer: Self-pay | Admitting: Emergency Medicine

## 2014-03-05 ENCOUNTER — Emergency Department (HOSPITAL_BASED_OUTPATIENT_CLINIC_OR_DEPARTMENT_OTHER)
Admission: EM | Admit: 2014-03-05 | Discharge: 2014-03-05 | Disposition: A | Discharge status: Home / Self Care | Payer: BC Managed Care – PPO | Attending: Emergency Medicine | Admitting: Emergency Medicine

## 2014-03-05 DIAGNOSIS — K219 Gastro-esophageal reflux disease without esophagitis: Secondary | ICD-10-CM | POA: Insufficient documentation

## 2014-03-05 DIAGNOSIS — R110 Nausea: Secondary | ICD-10-CM | POA: Insufficient documentation

## 2014-03-05 DIAGNOSIS — R5383 Other fatigue: Secondary | ICD-10-CM | POA: Insufficient documentation

## 2014-03-05 DIAGNOSIS — R002 Palpitations: Secondary | ICD-10-CM

## 2014-03-05 DIAGNOSIS — R42 Dizziness and giddiness: Secondary | ICD-10-CM | POA: Diagnosis present

## 2014-03-05 DIAGNOSIS — E039 Hypothyroidism, unspecified: Secondary | ICD-10-CM | POA: Diagnosis not present

## 2014-03-05 DIAGNOSIS — Z79899 Other long term (current) drug therapy: Secondary | ICD-10-CM | POA: Diagnosis not present

## 2014-03-05 DIAGNOSIS — E785 Hyperlipidemia, unspecified: Secondary | ICD-10-CM | POA: Insufficient documentation

## 2014-03-05 LAB — CBC WITH DIFFERENTIAL/PLATELET
Basophils Absolute: 0 10*3/uL (ref 0.0–0.1)
Basophils Relative: 0 % (ref 0–1)
Eosinophils Absolute: 0 10*3/uL (ref 0.0–0.7)
Eosinophils Relative: 1 % (ref 0–5)
HCT: 37.5 % (ref 36.0–46.0)
Hemoglobin: 13.4 g/dL (ref 12.0–15.0)
Lymphocytes Relative: 33 % (ref 12–46)
Lymphs Abs: 1.2 10*3/uL (ref 0.7–4.0)
MCH: 29.1 pg (ref 26.0–34.0)
MCHC: 35.7 g/dL (ref 30.0–36.0)
MCV: 81.5 fL (ref 78.0–100.0)
Monocytes Absolute: 0.3 10*3/uL (ref 0.1–1.0)
Monocytes Relative: 8 % (ref 3–12)
Neutro Abs: 2.2 10*3/uL (ref 1.7–7.7)
Neutrophils Relative %: 58 % (ref 43–77)
Platelets: 134 10*3/uL — ABNORMAL LOW (ref 150–400)
RBC: 4.6 MIL/uL (ref 3.87–5.11)
RDW: 14.2 % (ref 11.5–15.5)
WBC: 3.8 10*3/uL — ABNORMAL LOW (ref 4.0–10.5)

## 2014-03-05 LAB — BASIC METABOLIC PANEL
Anion gap: 13 (ref 5–15)
BUN: 19 mg/dL (ref 6–23)
CO2: 26 mEq/L (ref 19–32)
Calcium: 10.4 mg/dL (ref 8.4–10.5)
Chloride: 105 mEq/L (ref 96–112)
Creatinine, Ser: 0.9 mg/dL (ref 0.50–1.10)
GFR calc Af Amer: 78 mL/min — ABNORMAL LOW (ref >90)
GFR calc non Af Amer: 68 mL/min — ABNORMAL LOW (ref >90)
Glucose, Bld: 102 mg/dL — ABNORMAL HIGH (ref 70–99)
Potassium: 3.7 mEq/L (ref 3.7–5.3)
Sodium: 144 mEq/L (ref 137–147)

## 2014-03-05 LAB — URINALYSIS, ROUTINE W REFLEX MICROSCOPIC
Bilirubin Urine: NEGATIVE
Glucose, UA: NEGATIVE mg/dL
Ketones, ur: NEGATIVE mg/dL
Leukocytes, UA: NEGATIVE
Nitrite: NEGATIVE
Protein, ur: NEGATIVE mg/dL
Specific Gravity, Urine: 1.003 — ABNORMAL LOW (ref 1.005–1.030)
Urobilinogen, UA: 0.2 mg/dL (ref 0.0–1.0)
pH: 6.5 (ref 5.0–8.0)

## 2014-03-05 LAB — URINE MICROSCOPIC-ADD ON

## 2014-03-05 LAB — TROPONIN I: Troponin I: <0.3 ng/mL (ref <0.30)

## 2014-03-05 MED ORDER — SODIUM CHLORIDE 0.9 % IV BOLUS (SEPSIS)
1000.0000 mL | Freq: Once | INTRAVENOUS | Status: AC
  Administered 2014-03-05: 1000 mL via INTRAVENOUS

## 2014-03-05 MED ORDER — ONDANSETRON HCL 4 MG/2ML IJ SOLN
4.0000 mg | Freq: Once | INTRAMUSCULAR | Status: AC
  Administered 2014-03-05: 4 mg via INTRAVENOUS
  Filled 2014-03-05: qty 2

## 2014-03-05 MED ORDER — ONDANSETRON 4 MG PO TBDP: 4.0000 mg | ORAL_TABLET | Freq: Three times a day (TID) | ORAL | Status: DC | PRN

## 2014-03-05 NOTE — ED Notes (Signed)
Pt presents to ED with multiple complaints of dizziness, high BP, weakness, high HR at home.  Pt states she was told at [facility] ED that her thyroid was low on Wednesday. And was told to follow with her primary.

## 2014-03-05 NOTE — Discharge Instructions (Signed)
Take zofran as needed for nausea. Your symptoms are likely related to hyperthyroidism from your thyroid medication. Refer to attached documents for more information. Follow up with your endocrinologist as scheduled. Return to the ED with worsening or concerning symptoms.

## 2014-03-05 NOTE — ED Provider Notes (Signed)
CSN: [id number]     Arrival date & time 03/05/14  1144 History   First MD Initiated Contact with Patient 03/05/14 1229     Chief Complaint  Patient presents with  . Dizziness  . Hypertension     (Consider location/radiation/quality/duration/timing/severity/associated sxs/prior Treatment) HPI Comments: Patient is a 63 year old female with a past medical history of hypothyroidism, SVT, and GERD who presents with lightheadedness and generalized weakness. Symptoms started about 4 days ago and have progressively worsened since the onset. Patient was seen at [facility] ED 4 days ago and was found to have TSH at 0.174. Patient contacted her PCP who instructed her to reduce her Armour from 2 tablets to 1.5 tablets. Yesterday was the first time the patient changed the medication. Patient reports persistent symptoms. She has an appointment with her Endocrinologist next week. Patient reports some associated nausea. No aggravating/alleviating factors. No abdominal pain, chest pain, vomiting, diarrhea, or dysuria.     Past Medical History  Diagnosis Date  . Hypothyroidism   . Hyperlipidemia   . SVT (supraventricular tachycardia)   . GERD (gastroesophageal reflux disease)    Past Surgical History  Procedure Laterality Date  . Cesarean section  1987  . Breast surgery  1976    CYST REMOVED  . Hysteroscopy  9.14.2012    w/myomectomy  . Left thumb surgery and carpal tunnel release  9/14   Family History  Problem Relation Age of Onset  . Hypertension Mother   . Heart disease Mother   . Mitral valve prolapse Mother   . Glaucoma Mother   . Arthritis Mother   . Hypertension Father   . Heart disease Father   . Colon polyps Father   . Glaucoma Father   . Arthritis Father   . Hypertension Brother   . Mitral valve prolapse Brother   . Glaucoma Brother   . Arthritis Brother   . Arthritis Sister    History  Substance Use Topics  . Smoking status: Never Smoker   . Smokeless tobacco: Never  Used  . Alcohol Use: Yes     Comment: rare   OB History    Gravida Para Term Preterm AB TAB SAB Ectopic Multiple Living   1 1 1       1      Review of Systems  Constitutional: Negative for fever, chills and fatigue.  HENT: Negative for trouble swallowing.   Eyes: Negative for visual disturbance.  Respiratory: Negative for shortness of breath.   Cardiovascular: Negative for chest pain and palpitations.  Gastrointestinal: Positive for nausea. Negative for vomiting, abdominal pain and diarrhea.  Genitourinary: Negative for dysuria and difficulty urinating.  Musculoskeletal: Negative for arthralgias and neck pain.  Skin: Negative for color change.  Neurological: Positive for weakness and light-headedness. Negative for dizziness.  Psychiatric/Behavioral: Negative for dysphoric mood.      Allergies  Epinephrine; Lipitor; and Lidocaine-epinephrine  Home Medications   Prior to Admission medications   Medication Sig Start Date End Date Taking? Authorizing Provider  Cholecalciferol (VITAMIN D3) 5000 UNITS TABS Take 1 tablet by mouth daily.     Historical Provider, MD  esomeprazole (NEXIUM) 40 MG capsule Take 40 mg by mouth daily at 12 noon.    Historical Provider, MD  fish oil-omega-3 fatty acids 1000 MG capsule Take 1 g by mouth daily.    Historical Provider, MD  metoprolol succinate (TOPROL-XL) 25 MG 24 hr tablet Take 25 mg by mouth every evening.    Historical Provider, MD  Multiple Vitamin (MULTIVITAMIN WITH MINERALS) TABS tablet Take 1 tablet by mouth daily.    Historical Provider, MD  polyethylene glycol (MIRALAX) packet Take 17 g by mouth daily. 02/28/14   Fransico Meadow, PA-C  potassium chloride (K-DUR) 10 MEQ tablet 2 TABLETS DAILY 03/04/14   Darlin Coco, MD  rosuvastatin (CRESTOR) 10 MG tablet Take 10 mg by mouth 3 (three) times a week. Every Monday, Wednesday, and Friday in the evenings.    Historical Provider, MD  thyroid (ARMOUR) 32.5 MG tablet Take 65 mg by mouth every  morning.     Historical Provider, MD  VERAMYST 27.5 MCG/SPRAY nasal spray Place 1 spray into the nose as needed.  04/01/13   Historical Provider, MD   BP 146/84 mmHg  Pulse 81  Temp(Src) 97.6 F (36.4 C) (Oral)  Resp 20  Ht 5\' 3"  (1.6 m)  Wt 159 lb (72.122 kg)  BMI 28.17 kg/m2  SpO2 100%  LMP 11/26/2000 Physical Exam  Constitutional: She is oriented to person, place, and time. She appears well-developed and well-nourished. No distress.  HENT:  Head: Normocephalic and atraumatic.  Eyes: Conjunctivae and EOM are normal.  Neck: Normal range of motion.  Cardiovascular: Normal rate and regular rhythm.  Exam reveals no gallop and no friction rub.   No murmur heard. Pulmonary/Chest: Effort normal and breath sounds normal. She has no wheezes. She has no rales. She exhibits no tenderness.  Abdominal: Soft. She exhibits no distension. There is no tenderness. There is no rebound.  Musculoskeletal: Normal range of motion.  Neurological: She is alert and oriented to person, place, and time. Coordination normal.  Speech is goal-oriented. Moves limbs without ataxia.   Skin: Skin is warm and dry.  Psychiatric: She has a normal mood and affect. Her behavior is normal.  Nursing note and vitals reviewed.   ED Course  Procedures (including critical care time) Labs Review Labs Reviewed  CBC WITH DIFFERENTIAL - Abnormal; Notable for the following:    WBC 3.8 (*)    Platelets 134 (*)    All other components within normal limits  BASIC METABOLIC PANEL - Abnormal; Notable for the following:    Glucose, Bld 102 (*)    GFR calc non Af Amer 68 (*)    GFR calc Af Amer 78 (*)    All other components within normal limits  URINALYSIS, ROUTINE W REFLEX MICROSCOPIC - Abnormal; Notable for the following:    Specific Gravity, Urine 1.003 (*)    Hgb urine dipstick TRACE (*)    All other components within normal limits  TROPONIN I  URINE MICROSCOPIC-ADD ON    Imaging Review No results found.   EKG  Interpretation   Date/Time:  Saturday March 05 2014 11:53:05 EST Ventricular Rate:  72 PR Interval:  156 QRS Duration: 74 QT Interval:  362 QTC Calculation: 396 R Axis:   54 Text Interpretation:  Normal sinus rhythm Normal ECG Confirmed by WARD,   DO, KRISTEN ([id number]) on 03/05/2014 12:49:41 PM      MDM   Final diagnoses:  Palpitations  Nausea  Other fatigue   1:34 PM Labs, troponin, and EKG unremarkable for acute changes. Vitals stable and patient afebrile. Patient will have fluids and zofran for symptoms. Urinalysis pending.   3:34 PM Patient's urinalysis unremarkable for acute changes. Patient reports improvement of her symptoms with zofran and IV fluids. Patient's symptoms likely due to low TSH level. Patient instructed to decrease dose to 1 tablet per day instead of 2.  Patient will follow up with Endocrinology and her PCP. Patient instructed to return with worsening or concerning symptoms. Vitals stable and patient afebrile.   Alvina Chou, PA-C 03/05/14 [place], DO 03/05/14 1542

## 2014-03-09 ENCOUNTER — Ambulatory Visit (INDEPENDENT_AMBULATORY_CARE_PROVIDER_SITE_OTHER): Payer: BC Managed Care – PPO | Admitting: Internal Medicine

## 2014-03-09 ENCOUNTER — Encounter: Payer: Self-pay | Admitting: Internal Medicine

## 2014-03-09 VITALS — BP 118/74 | HR 71 | Temp 98.0°F | Resp 12 | Ht 62.0 in | Wt 159.8 lb

## 2014-03-09 DIAGNOSIS — E039 Hypothyroidism, unspecified: Secondary | ICD-10-CM

## 2014-03-09 DIAGNOSIS — E063 Autoimmune thyroiditis: Secondary | ICD-10-CM

## 2014-03-09 MED ORDER — SYNTHROID 50 MCG PO TABS: 50.0000 ug | ORAL_TABLET | Freq: Every day | ORAL | Status: DC

## 2014-03-09 NOTE — Patient Instructions (Addendum)
Please stop Naturehroid and start Synthroid 50 mcg daily. Please move the acid reflux medication and the multivitamins at lunchtime or later. Take the thyroid hormone every day, with water, >30 minutes before breakfast, separated by >4 hours from acid reflux medications, calcium, iron, multivitamins. Please come back for thyroid labs in 1 month. Please come back for a follow-up appointment in 3 months.

## 2014-03-09 NOTE — Progress Notes (Signed)
Patient ID: [name], female   DOB: 02-15-1951, 63 y.o.   MRN: [id number]   HPI  [name] is a 63 y.o.-year-old female, referred by her PCP, MCNEILL,WENDY, MD, for management of Hashimoto's hypothyroidism. She had seen 2 other endocrinologists in the past.  Pt. 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 She started first on Armour, in 2010 she was started on Synthroid, then NatureThroid in 02/2013.   Pt was on NatureThroid 32.5 mg x2 tabs daily + Synthroid 25 mcg (dose started 5 weeks ago, increased by her naturopath from NatureThroid 32.5 mg!) >> started to have palpitations >> stopped Synthroid 2.5 weeks ago >> seen in the emergency room on 03/05/2014 for palpitations and nausea; found to be in PSVT. She has previous episodes of palpitations and ED visits for palpitations >> she decreased to 1 tab of NatureThroid as advised by the ED physician. She is on metoprolol XL 25 mg daily.  She takes the thyroid hh: - fasting - with water - separated by >45-60 min from b'fast  - no calcium, iron - + PPIs and multivitamins with b'fast!  I reviewed pt's thyroid tests: Lab Results  Component Value Date   TSH 0.174* 03/02/2014   TSH 1.601 10/22/2013   TSH 0.183  01/07/2009   FREET4 0.85 01/07/2009  05/26/2013: TSH 0.766  Pt describes: - + palpitations >> much better (95%) - no weight gain/loss - + fatigue, + weakness - + nausea/ + constipation >> started Miralax >> better - sleeping is better - + heat intolerance, but cold intoler. - no depression/+ anxiety - feeling jittery >> improved - + HAs, sinus pressure - + tremors  Pt denies feeling nodules in neck, hoarseness, dysphagia/odynophagia, SOB with lying down.  She has + FH of thyroid disorders in sister. No FH of thyroid cancer.  No h/o radiation tx to head or neck. No recent use of iodine supplements.  ROS: Constitutional:see HPI Eyes:  + occasional blurry vision, no xerophthalmia ENT: no sore throat, no nodules palpated in throat, no dysphagia/odynophagia, no hoarseness Cardiovascular: no CP/SOB/+ palpitations/no leg swelling Respiratory: no cough/SOB Gastrointestinal: + N/no V/D/+ C Musculoskeletal: no muscle/+ joint aches Skin: no rashes, + dry skin Neurological: + tremors/no numbness/tingling/dizziness, + HA (pressure) Psychiatric: no depression/anxiety  Past Medical History  Diagnosis Date  . Hypothyroidism   . Hyperlipidemia   . SVT (supraventricular tachycardia)   . GERD (gastroesophageal reflux disease)    Past Surgical History  Procedure Laterality Date  . Cesarean section  1987  . Breast surgery  1976    CYST REMOVED  . Hysteroscopy  9.14.2012    w/myomectomy  . Left thumb surgery and carpal tunnel release  9/14   History   Social History  . Marital Status: Widowed    Spouse Name: N/A    Number of Children: 1   Occupational History  . RN   Social History Main Topics  . Smoking status: Never Smoker   . Smokeless tobacco: Never Used  . Alcohol Use: Yes     Comment: rare  . Drug Use: No  . Sexual Activity:    Partners: Male   Current Outpatient Prescriptions on File Prior to Visit  Medication Sig Dispense Refill  . Cholecalciferol (VITAMIN D3) 5000 UNITS TABS Take 1 tablet by mouth daily.     [name] esomeprazole (NEXIUM) 40 MG capsule Take 40 mg by mouth daily at 12 noon.    [name]  fish oil-omega-3 fatty acids 1000 MG capsule Take 1 g by mouth daily.    . metoprolol succinate (TOPROL-XL) 25 MG 24 hr tablet Take 25 mg by mouth every evening.    . Multiple Vitamin (MULTIVITAMIN WITH MINERALS) TABS tablet Take 1 tablet by mouth daily.    . ondansetron (ZOFRAN ODT) 4 MG disintegrating tablet Take 1 tablet (4 mg total) by mouth every 8 (eight) hours as needed for nausea or vomiting. 12 tablet 0  . polyethylene glycol (MIRALAX) packet Take 17 g by mouth daily. 14 each 0  . potassium chloride (K-DUR) 10  MEQ tablet 2 TABLETS DAILY 180 tablet 3  . rosuvastatin (CRESTOR) 10 MG tablet Take 10 mg by mouth 3 (three) times a week. Every Monday, Wednesday, and Friday in the evenings.    [name] thyroid (ARMOUR) 32.5 MG tablet Take 65 mg by mouth every morning.     . VERAMYST 27.5 MCG/SPRAY nasal spray Place 1 spray into the nose as needed.      No current facility-administered medications on file prior to visit.   Allergies  Allergen Reactions  . Epinephrine   . Lipitor [Atorvastatin]     Muscle aches  . Lidocaine-Epinephrine [Lidocaine-Epinephrine] Palpitations   Family History  Problem Relation Age of Onset  . Hypertension Mother   . Heart disease Mother   . Mitral valve prolapse Mother   . Glaucoma Mother   . Arthritis Mother   . Hypertension Father   . Heart disease Father   . Colon polyps Father   . Glaucoma Father   . Arthritis Father   . Hypertension Brother   . Mitral valve prolapse Brother   . Glaucoma Brother   . Arthritis Brother   . Arthritis Sister    PE: BP 118/74 mmHg  Pulse 71  Temp(Src) 98 F (36.7 C) (Oral)  Resp 12  Ht 5\' 2"  (1.575 m)  Wt 159 lb 12.8 oz (72.485 kg)  BMI 29.22 kg/m2  SpO2 96%  LMP 11/26/2000 Wt Readings from Last 3 Encounters:  03/09/14 159 lb 12.8 oz (72.485 kg)  03/05/14 159 lb (72.122 kg)  03/04/14 159 lb (72.122 kg)   Constitutional: overweight, in NAD Eyes: PERRLA, EOMI, slight exophthalmos, no lid lag, no stare ENT: moist mucous membranes, no thyromegaly, no cervical lymphadenopathy Cardiovascular: RRR, No MRG Respiratory: CTA B Gastrointestinal: abdomen soft, NT, ND, BS+ Musculoskeletal: no deformities, strength intact in all 4 Skin: moist, warm, no rashes Neurological: no tremor with outstretched hands, DTR normal in all 4  ASSESSMENT: 1. Hashimoto's Hypothyroidism - on purified thyroid extract, with over suppression of her TSH >> Palpitations >> PSVT  PLAN:  1. Patient with long-standing hypothyroidism, on Naturethyroid  therapy, now thankfully on a reduced dose (before her PSVT episode, she was using the equivalent of ~100 g of LT4, now on ~50 LT4). - I had a long discussion with the patient explaining why it is not the good idea for her to continue with a purified thyroid extract. We discussed about positive and negative aspects of using purified thyroid extract. I underlined the fact that:  Naturethroid is purified from porcine thyroid glands, which is not without risk for contaminants  Also, the ratio between T4 and T3 in these formulations is physiologic for pigs, not for humans.  The short half life of T3 can cause fluctuations in blood levels, which can result in mood swings and heart rhythm abnormalities.  The concentration of the active substances (T4 and T3) can be expected  to vary between different Naturethroid lots, which can cause variation in the thyroid function tests.  - in her case, I would definitely suggest to switch back to Synthroid only >> She agrees with this, so I advised her to stop Naturethroid and start Synthroid 50 g daily. - We discussed about correct intake of levothyroxine, fasting, with water, separated by at least 30 minutes from breakfast, and separated by more than 4 hours from calcium, iron, multivitamins, acid reflux medications (PPIs). I advised her to move her multivitamins and PPIs at least at lunch or later. - will check thyroid tests in 4 weeks: TSH, free T4 to see if we need to change the LT4 dose Return in about 3 months (around 06/09/2014).  - time spent with the patient: 45 min, of which >50% was spent in obtaining information about her symptoms, reviewing her previous labs, evaluations, and treatments, counseling her about her condition (please see the discussed topics above), and developing a plan to further investigate it and treat it; she had a number of questions which I addressed.

## 2014-03-14 ENCOUNTER — Encounter (INDEPENDENT_AMBULATORY_CARE_PROVIDER_SITE_OTHER): Payer: BC Managed Care – PPO

## 2014-03-14 ENCOUNTER — Encounter: Payer: Self-pay | Admitting: *Deleted

## 2014-03-14 DIAGNOSIS — I471 Supraventricular tachycardia: Secondary | ICD-10-CM

## 2014-03-14 NOTE — Progress Notes (Signed)
Patient ID: [name], female   DOB: [birth year], 63 y.o.   MRN: [id number] [employer] verite 30 day cardiac event monitor applied to patient.

## 2014-03-15 ENCOUNTER — Encounter: Payer: Self-pay | Admitting: Cardiology

## 2014-03-16 NOTE — Telephone Encounter (Signed)
This encounter was created in error - please disregard.

## 2014-03-31 ENCOUNTER — Other Ambulatory Visit: Payer: Self-pay | Admitting: Neurosurgery

## 2014-03-31 DIAGNOSIS — D329 Benign neoplasm of meninges, unspecified: Secondary | ICD-10-CM

## 2014-04-08 ENCOUNTER — Other Ambulatory Visit: Payer: BC Managed Care – PPO

## 2014-04-08 DIAGNOSIS — E039 Hypothyroidism, unspecified: Secondary | ICD-10-CM

## 2014-04-11 ENCOUNTER — Ambulatory Visit
Admission: RE | Admit: 2014-04-11 | Discharge: 2014-04-11 | Disposition: A | Discharge status: Home / Self Care | Payer: BC Managed Care – PPO | Source: Ambulatory Visit | Attending: Neurosurgery | Admitting: Neurosurgery

## 2014-04-11 ENCOUNTER — Other Ambulatory Visit: Payer: BC Managed Care – PPO

## 2014-04-11 DIAGNOSIS — D329 Benign neoplasm of meninges, unspecified: Secondary | ICD-10-CM

## 2014-04-11 DIAGNOSIS — E038 Other specified hypothyroidism: Secondary | ICD-10-CM

## 2014-04-11 MED ORDER — GADOBENATE DIMEGLUMINE 529 MG/ML IV SOLN
15.0000 mL | Freq: Once | INTRAVENOUS | Status: AC | PRN
  Administered 2014-04-11: 15 mL via INTRAVENOUS

## 2014-04-12 ENCOUNTER — Telehealth: Payer: Self-pay | Admitting: Internal Medicine

## 2014-04-12 LAB — TSH: TSH: 2.243 u[IU]/mL (ref 0.350–4.500)

## 2014-04-12 LAB — T4, FREE: Free T4: 1.36 ng/dL (ref 0.80–1.80)

## 2014-04-12 NOTE — Telephone Encounter (Signed)
Please read note below and advise.  

## 2014-04-12 NOTE — Telephone Encounter (Signed)
Pt needs lab results from visit

## 2014-04-13 NOTE — Telephone Encounter (Signed)
Labs are excellent. Please see results section.

## 2014-04-13 NOTE — Telephone Encounter (Signed)
Called pt and lvm advising her per Dr Arman Filter note. Advised pt of the actual results. Advised pt to call with any questions.

## 2014-04-26 ENCOUNTER — Telehealth: Payer: Self-pay | Admitting: *Deleted

## 2014-04-26 NOTE — Telephone Encounter (Signed)
Left message to call back   30 day event monitor reviewed by  Dr. Mare Ferrari  Interpretation: NSR with occasional PAC's

## 2014-05-03 ENCOUNTER — Other Ambulatory Visit: Payer: Self-pay | Admitting: Cardiology

## 2014-05-05 ENCOUNTER — Other Ambulatory Visit (INDEPENDENT_AMBULATORY_CARE_PROVIDER_SITE_OTHER): Payer: BLUE CROSS/BLUE SHIELD | Admitting: *Deleted

## 2014-05-05 DIAGNOSIS — I119 Hypertensive heart disease without heart failure: Secondary | ICD-10-CM

## 2014-05-05 DIAGNOSIS — E038 Other specified hypothyroidism: Secondary | ICD-10-CM

## 2014-05-05 DIAGNOSIS — I471 Supraventricular tachycardia: Secondary | ICD-10-CM

## 2014-05-05 DIAGNOSIS — E876 Hypokalemia: Secondary | ICD-10-CM

## 2014-05-05 LAB — BASIC METABOLIC PANEL
BUN: 17 mg/dL (ref 6–23)
CO2: 31 mEq/L (ref 19–32)
Calcium: 10 mg/dL (ref 8.4–10.5)
Chloride: 106 mEq/L (ref 96–112)
Creatinine, Ser: 0.84 mg/dL (ref 0.40–1.20)
GFR: 88.33 mL/min (ref >60.00)
Glucose, Bld: 92 mg/dL (ref 70–99)
Potassium: 3.7 mEq/L (ref 3.5–5.1)
Sodium: 141 mEq/L (ref 135–145)

## 2014-05-05 LAB — MAGNESIUM: Magnesium: 1.9 mg/dL (ref 1.5–2.5)

## 2014-05-06 ENCOUNTER — Encounter: Payer: Self-pay | Admitting: Cardiology

## 2014-05-06 NOTE — Telephone Encounter (Signed)
Left message to call back  

## 2014-05-06 NOTE — Telephone Encounter (Signed)
New Msg         Pt states she is returning phone call from today.    Please call back.

## 2014-05-06 NOTE — Telephone Encounter (Signed)
Advised patient of monitor    [name] at 05/06/2014 12:48 PM     Status: Signed       Expand All Collapse All   New Msg         Pt states she is returning phone call from today.    Please call back.

## 2014-05-06 NOTE — Telephone Encounter (Signed)
This encounter was created in error - please disregard.

## 2014-05-07 NOTE — Progress Notes (Signed)
Quick Note:  Please report to patient. The recent labs are stable. Continue same medication and careful diet. ______ 

## 2014-05-13 ENCOUNTER — Ambulatory Visit (HOSPITAL_COMMUNITY): Admission: RE | Admit: 2014-05-13 | Payer: BLUE CROSS/BLUE SHIELD | Source: Ambulatory Visit

## 2014-05-18 ENCOUNTER — Ambulatory Visit (HOSPITAL_COMMUNITY)
Admission: RE | Admit: 2014-05-18 | Discharge: 2014-05-18 | Disposition: A | Discharge status: Home / Self Care | Payer: BLUE CROSS/BLUE SHIELD | Source: Ambulatory Visit | Attending: Women's Health | Admitting: Women's Health

## 2014-05-18 DIAGNOSIS — Z13820 Encounter for screening for osteoporosis: Secondary | ICD-10-CM | POA: Insufficient documentation

## 2014-05-18 DIAGNOSIS — Z780 Asymptomatic menopausal state: Secondary | ICD-10-CM | POA: Diagnosis not present

## 2014-05-18 DIAGNOSIS — M8580 Other specified disorders of bone density and structure, unspecified site: Secondary | ICD-10-CM

## 2014-06-03 ENCOUNTER — Encounter: Payer: Self-pay | Admitting: Women's Health

## 2014-06-07 ENCOUNTER — Other Ambulatory Visit (HOSPITAL_COMMUNITY)
Admission: RE | Admit: 2014-06-07 | Discharge: 2014-06-07 | Disposition: A | Discharge status: Home / Self Care | Payer: BLUE CROSS/BLUE SHIELD | Source: Ambulatory Visit | Attending: Women's Health | Admitting: Women's Health

## 2014-06-07 ENCOUNTER — Ambulatory Visit (INDEPENDENT_AMBULATORY_CARE_PROVIDER_SITE_OTHER): Payer: BLUE CROSS/BLUE SHIELD | Admitting: Women's Health

## 2014-06-07 ENCOUNTER — Encounter: Payer: Self-pay | Admitting: Women's Health

## 2014-06-07 VITALS — BP 115/76 | Ht 62.0 in | Wt 164.0 lb

## 2014-06-07 DIAGNOSIS — R87810 Cervical high risk human papillomavirus (HPV) DNA test positive: Secondary | ICD-10-CM | POA: Insufficient documentation

## 2014-06-07 DIAGNOSIS — Z01419 Encounter for gynecological examination (general) (routine) without abnormal findings: Secondary | ICD-10-CM

## 2014-06-07 DIAGNOSIS — Z1151 Encounter for screening for human papillomavirus (HPV): Secondary | ICD-10-CM | POA: Insufficient documentation

## 2014-06-07 DIAGNOSIS — M8580 Other specified disorders of bone density and structure, unspecified site: Secondary | ICD-10-CM

## 2014-06-07 NOTE — Progress Notes (Signed)
[name] 1950-06-17 [id number]    History:    Presents for annual exam. Postmenopausal/no HRT/no bleeding. Normal Pap and mammogram history. Osteopenia 04/2014 stable DEXA T score -1.7 FRAX 7.3%/0.4%. Hypercholesterolemia primary care manages . 2015 Hashimoto thyroiditis with numerous problems regulating. History of endometrial benign polyp 2012. 2010 benign colon polyps.  Past medical history, past surgical history, family history and social history were all reviewed and documented in the EPIC chart. 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 Long-term boyfriend died of prostate cancer this year. One daughter.  ROS:  A ROS was performed and pertinent positives and negatives are included.  Exam:  Filed Vitals:   06/07/14 1407  BP: 115/76    General appearance:  Normal Thyroid:  Symmetrical, normal in size, without palpable masses or nodularity. Respiratory  Auscultation:  Clear without wheezing or rhonchi Cardiovascular  Auscultation:  Regular rate, without rubs, murmurs or gallops  Edema/varicosities:  Not grossly evident Abdominal  Soft,nontender, without masses, guarding or rebound.  Liver/spleen:  No organomegaly noted  Hernia:  None appreciated  Skin  Inspection:  Grossly normal   Breasts: Examined lying and sitting.     Right: Without masses, retractions, discharge or axillary adenopathy.     Left: Without masses, retractions, discharge or axillary adenopathy. Gentitourinary   Inguinal/mons:  Normal without inguinal adenopathy  External genitalia:  Normal  BUS/Urethra/Skene's glands:  Normal  Vagina:  Normal  Cervix:  Normal  Uterus:  retroverted, normal in size, shape and contour.  Midline and mobile  Adnexa/parametria:     Rt: Without masses or tenderness.   Lt: Without masses or tenderness.  Anus and perineum: Normal  Digital rectal exam: Normal sphincter tone without palpated masses or tenderness  Assessment/Plan:  64 y.o. DBF G1P1 for annual exam.     Postmenopausal/no bleeding/no HRT Hypercholesteremia-primary care manages labs and meds Osteopenia  stable without elevated FRAX  2010 benign colon polyp Hashimoto thyroiditis endocrinologist managing  Plan: Schedule repeat colonoscopy. SBE's, continue annual screening mammogram, calcium rich diet, vitamin D 2000 daily. Repeat vitamin D check at primary, reports normal in past. Home safety, fall prevention and importance of regular daily exercise reviewed. UA, Pap normal 2012, Pap with HR HPV typing, new screening guidelines reviewed. Encouraged Zostavax, will get at primary care.[name]   Huel Cote [facility], 4:03 PM 06/07/2014

## 2014-06-07 NOTE — Patient Instructions (Signed)
Health Recommendations for Postmenopausal Women Respected and ongoing research has looked at the most common causes of death, disability, and poor quality of life in postmenopausal women. The causes include heart disease, diseases of blood vessels, diabetes, depression, cancer, and bone loss (osteoporosis). Many things can be done to help lower the chances of developing these and other common problems. CARDIOVASCULAR DISEASE Heart Disease: A heart attack is a medical emergency. Know the signs and symptoms of a heart attack. Below are things women can do to reduce their risk for heart disease.   Do not smoke. If you smoke, quit.  Aim for a healthy weight. Being overweight causes many preventable deaths. Eat a healthy and balanced diet and drink an adequate amount of liquids.  Get moving. Make a commitment to be more physically active. Aim for 30 minutes of activity on most, if not all days of the week.  Eat for heart health. Choose a diet that is low in saturated fat and cholesterol and eliminate trans fat. Include whole grains, vegetables, and fruits. Read and understand the labels on food containers before buying.  Know your numbers. Ask your caregiver to check your blood pressure, cholesterol (total, HDL, LDL, triglycerides) and blood glucose. Work with your caregiver on improving your entire clinical picture.  High blood pressure. Limit or stop your table salt intake (try salt substitute and food seasonings). Avoid salty foods and drinks. Read labels on food containers before buying. Eating well and exercising can help control high blood pressure. STROKE  Stroke is a medical emergency. Stroke may be the result of a blood clot in a blood vessel in the brain or by a brain hemorrhage (bleeding). Know the signs and symptoms of a stroke. To lower the risk of developing a stroke:  Avoid fatty foods.  Quit smoking.  Control your diabetes, blood pressure, and irregular heart rate. THROMBOPHLEBITIS  (BLOOD CLOT) OF THE LEG  Becoming overweight and leading a stationary lifestyle may also contribute to developing blood clots. Controlling your diet and exercising will help lower the risk of developing blood clots. CANCER SCREENING  Breast Cancer: Take steps to reduce your risk of breast cancer.  You should practice "breast self-awareness." This means understanding the normal appearance and feel of your breasts and should include breast self-examination. Any changes detected, no matter how small, should be reported to your caregiver.  After age 40, you should have a clinical breast exam (CBE) every year.  Starting at age 40, you should consider having a mammogram (breast X-ray) every year.  If you have a family history of breast cancer, talk to your caregiver about genetic screening.  If you are at high risk for breast cancer, talk to your caregiver about having an MRI and a mammogram every year.  Intestinal or Stomach Cancer: Tests to consider are a rectal exam, fecal occult blood, sigmoidoscopy, and colonoscopy. Women who are high risk may need to be screened at an earlier age and more often.  Cervical Cancer:  Beginning at age 30, you should have a Pap test every 3 years as long as the past 3 Pap tests have been normal.  If you have had past treatment for cervical cancer or a condition that could lead to cancer, you need Pap tests and screening for cancer for at least 20 years after your treatment.  If you had a hysterectomy for a problem that was not cancer or a condition that could lead to cancer, then you no longer need Pap tests.    If you are between ages 65 and 70, and you have had normal Pap tests going back 10 years, you no longer need Pap tests.  If Pap tests have been discontinued, risk factors (such as a new sexual partner) need to be reassessed to determine if screening should be resumed.  Some medical problems can increase the chance of getting cervical cancer. In these  cases, your caregiver may recommend more frequent screening and Pap tests.  Uterine Cancer: If you have vaginal bleeding after reaching menopause, you should notify your caregiver.  Ovarian Cancer: Other than yearly pelvic exams, there are no reliable tests available to screen for ovarian cancer at this time except for yearly pelvic exams.  Lung Cancer: Yearly chest X-rays can detect lung cancer and should be done on high risk women, such as cigarette smokers and women with chronic lung disease (emphysema).  Skin Cancer: A complete body skin exam should be done at your yearly examination. Avoid overexposure to the sun and ultraviolet light lamps. Use a strong sun block cream when in the sun. All of these things are important for lowering the risk of skin cancer. MENOPAUSE Menopause Symptoms: Hormone therapy products are effective for treating symptoms associated with menopause:  Moderate to severe hot flashes.  Night sweats.  Mood swings.  Headaches.  Tiredness.  Loss of sex drive.  Insomnia.  Other symptoms. Hormone replacement carries certain risks, especially in older women. Women who use or are thinking about using estrogen or estrogen with progestin treatments should discuss that with their caregiver. Your caregiver will help you understand the benefits and risks. The ideal dose of hormone replacement therapy is not known. The Food and Drug Administration (FDA) has concluded that hormone therapy should be used only at the lowest doses and for the shortest amount of time to reach treatment goals.  OSTEOPOROSIS Protecting Against Bone Loss and Preventing Fracture If you use hormone therapy for prevention of bone loss (osteoporosis), the risks for bone loss must outweigh the risk of the therapy. Ask your caregiver about other medications known to be safe and effective for preventing bone loss and fractures. To guard against bone loss or fractures, the following is recommended:  If  you are younger than age 50, take 1000 mg of calcium and at least 600 mg of Vitamin D per day.  If you are older than age 50 but younger than age 70, take 1200 mg of calcium and at least 600 mg of Vitamin D per day.  If you are older than age 70, take 1200 mg of calcium and at least 800 mg of Vitamin D per day. Smoking and excessive alcohol intake increases the risk of osteoporosis. Eat foods rich in calcium and vitamin D and do weight bearing exercises several times a week as your caregiver suggests. DIABETES Diabetes Mellitus: If you have type I or type 2 diabetes, you should keep your blood sugar under control with diet, exercise, and recommended medication. Avoid starchy and fatty foods, and too many sweets. Being overweight can make diabetes control more difficult. COGNITION AND MEMORY Cognition and Memory: Menopausal hormone therapy is not recommended for the prevention of cognitive disorders such as Alzheimer's disease or memory loss.  DEPRESSION  Depression may occur at any age, but it is common in elderly women. This may be because of physical, medical, social (loneliness), or financial problems and needs. If you are experiencing depression because of medical problems and control of symptoms, talk to your caregiver about this. Physical   activity and exercise may help with mood and sleep. Community and volunteer involvement may improve your sense of value and worth. If you have depression and you feel that the problem is getting worse or becoming severe, talk to your caregiver about which treatment options are best for you. ACCIDENTS  Accidents are common and can be serious in elderly woman. Prepare your house to prevent accidents. Eliminate throw rugs, place hand bars in bath, shower, and toilet areas. Avoid wearing high heeled shoes or walking on wet, snowy, and icy areas. Limit or stop driving if you have vision or hearing problems, or if you feel you are unsteady with your movements and  reflexes. HEPATITIS C Hepatitis C is a type of viral infection affecting the liver. It is spread mainly through contact with blood from an infected person. It can be treated, but if left untreated, it can lead to severe liver damage over the years. Many people who are infected do not know that the virus is in their blood. If you are a "baby-boomer", it is recommended that you have one screening test for Hepatitis C. IMMUNIZATIONS  Several immunizations are important to consider having during your senior years, including:   Tetanus, diphtheria, and pertussis booster shot.  Influenza every year before the flu season begins.  Pneumonia vaccine.  Shingles vaccine.  Others, as indicated based on your specific needs. Talk to your caregiver about these. Document Released: 05/31/2005 Document Revised: 08/23/2013 Document Reviewed: 01/25/2008 [employer] Patient Information 2015 [employer]. This information is not intended to replace advice given to you by your health care provider. Make sure you discuss any questions you have with your health care provider.  

## 2014-06-08 ENCOUNTER — Ambulatory Visit
Admission: RE | Admit: 2014-06-08 | Discharge: 2014-06-08 | Disposition: A | Discharge status: Home / Self Care | Payer: BLUE CROSS/BLUE SHIELD | Source: Ambulatory Visit | Attending: Internal Medicine | Admitting: Internal Medicine

## 2014-06-08 ENCOUNTER — Encounter: Payer: Self-pay | Admitting: Internal Medicine

## 2014-06-08 ENCOUNTER — Ambulatory Visit (INDEPENDENT_AMBULATORY_CARE_PROVIDER_SITE_OTHER): Payer: BLUE CROSS/BLUE SHIELD | Admitting: Internal Medicine

## 2014-06-08 VITALS — BP 128/78 | HR 72 | Temp 98.4°F | Resp 12 | Wt 164.8 lb

## 2014-06-08 DIAGNOSIS — E039 Hypothyroidism, unspecified: Secondary | ICD-10-CM

## 2014-06-08 DIAGNOSIS — E063 Autoimmune thyroiditis: Secondary | ICD-10-CM

## 2014-06-08 NOTE — Progress Notes (Addendum)
Patient ID: [name], female   DOB: Aug 30, 1950, 64 y.o.   MRN: [id number]   HPI  [name] is a 64 y.o.-year-old female, returning for Hashimoto's hypothyroidism. She had seen 2 other endocrinologists in the past.  Reviewed hx: Pt. 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 She started first on Armour, in 2010 she was started on Synthroid, then NatureThroid in 02/2013.   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 She has previous episodes of palpitations and ED visits for palpitations >> she decreased to 1 tab of NatureThroid as advised by the ED physician. She is on metoprolol XL 25 mg daily.  At last visit, we changed to 50 mcg Synthroid DAW. Labs after the change were excellent - see below.  She takes the thyroid hh: - fasting - with water - separated by >45-60 min from b'fast  - no calcium, iron - + PPIs 1h later!! - + multivitamins in the evening  I reviewed pt's thyroid tests: Lab Results  Component Value Date   TSH 2.243 04/11/2014   TSH 0.174* 03/02/2014   TSH 1.601 10/22/2013   TSH 0.183  01/07/2009   FREET4 1.36 04/11/2014   FREET4 0.85 01/07/2009  05/26/2013: TSH 0.766  Pt describes: - + palpitations >> much better - no weight gain/loss - + fatigue - improved nausea/constipation - sleeping is better - + heat intolerance (hot flushes) - no depression/+ anxiety - resolved tremors  Pt denies feeling nodules in neck, hoarseness, dysphagia/odynophagia, SOB with lying down. She feels her neck is spasming (ant neck).  ROS: Constitutional:see HPI Eyes: + occasional blurry vision, no xerophthalmia ENT: no sore throat, no nodules  palpated in throat, no dysphagia/odynophagia, + hoarseness Cardiovascular: no CP/SOB/+ palpitations - improved/no leg swelling Respiratory: no cough/SOB Gastrointestinal: + N/no V/D/+ C Musculoskeletal: + muscle/+ joint aches Skin: no rashes Neurological: + tremors/no numbness/tingling/dizziness, + HAs  I reviewed pt's medications, allergies, PMH, social hx, family hx, and changes were documented in the history of present illness. Otherwise, unchanged from my initial visit note:  Past Medical History  Diagnosis Date  . Hypothyroidism   . Hyperlipidemia   . SVT (supraventricular tachycardia)   . GERD (gastroesophageal reflux disease)    Past Surgical History  Procedure Laterality Date  . Cesarean section  1987  . Breast surgery  1976    CYST REMOVED  . Hysteroscopy  9.14.2012    w/myomectomy  . Left thumb surgery and carpal tunnel release  9/14   History   Social History  . Marital Status: Widowed    Spouse Name: N/A    Number of Children: 1   Occupational History  . RN   Social History Main Topics  . Smoking status: Never Smoker   . Smokeless tobacco: Never Used  . Alcohol Use: Yes     Comment: rare  . Drug Use: No  . Sexual Activity:    Partners: Male   Current Outpatient Prescriptions on File Prior to Visit  Medication Sig Dispense Refill  . Cholecalciferol (VITAMIN D3) 5000 UNITS TABS Take 1 tablet by mouth daily.     [name] esomeprazole (NEXIUM) 40 MG capsule Take 40 mg by mouth daily  at 12 noon.    . fish oil-omega-3 fatty acids 1000 MG capsule Take 1 g by mouth daily.    . metoprolol succinate (TOPROL-XL) 25 MG 24 hr tablet Take 25 mg by mouth every evening.    . metoprolol succinate (TOPROL-XL) 25 MG 24 hr tablet TAKE 1 TABLET (25 MG TOTAL) BY MOUTH AT BEDTIME. 90 tablet 1  . Multiple Vitamin (MULTIVITAMIN WITH MINERALS) TABS tablet Take 1 tablet by mouth daily.    . ondansetron (ZOFRAN ODT) 4 MG disintegrating tablet Take 1 tablet (4 mg total) by mouth every 8  (eight) hours as needed for nausea or vomiting. 12 tablet 0  . polyethylene glycol (MIRALAX) packet Take 17 g by mouth daily. 14 each 0  . potassium chloride (K-DUR) 10 MEQ tablet 2 TABLETS DAILY 180 tablet 3  . rosuvastatin (CRESTOR) 10 MG tablet Take 10 mg by mouth 3 (three) times a week. Every Monday, Wednesday, and Friday in the evenings.    . SYNTHROID 50 MCG tablet Take 1 tablet (50 mcg total) by mouth daily before breakfast. 30 tablet 2  . VERAMYST 27.5 MCG/SPRAY nasal spray Place 1 spray into the nose as needed.      No current facility-administered medications on file prior to visit.   Allergies  Allergen Reactions  . Epinephrine   . Lipitor [Atorvastatin]     Muscle aches  . Lidocaine-Epinephrine [Lidocaine-Epinephrine] Palpitations   Family History  Problem Relation Age of Onset  . Hypertension Mother   . Heart disease Mother   . Mitral valve prolapse Mother   . Glaucoma Mother   . Arthritis Mother   . Hypertension Father   . Heart disease Father   . Colon polyps Father   . Glaucoma Father   . Arthritis Father   . Hypertension Brother   . Mitral valve prolapse Brother   . Glaucoma Brother   . Arthritis Brother   . Arthritis Sister    PE: BP 128/78 mmHg  Pulse 72  Temp(Src) 98.4 F (36.9 C) (Oral)  Resp 12  Wt 164 lb 12.8 oz (74.753 kg)  SpO2 99%  LMP 11/26/2000 Body mass index is 30.13 kg/(m^2).  Wt Readings from Last 3 Encounters:  06/08/14 164 lb 12.8 oz (74.753 kg)  06/07/14 164 lb (74.39 kg)  03/09/14 159 lb 12.8 oz (72.485 kg)   Constitutional: overweight, in NAD Eyes: PERRLA, EOMI, + exophthalmos, no lid lag, no stare ENT: moist mucous membranes, no thyromegaly, no cervical lymphadenopathy Cardiovascular: RRR, No MRG Respiratory: CTA B Gastrointestinal: abdomen soft, NT, ND, BS+ Musculoskeletal: no deformities, strength intact in all 4 Skin: moist, warm, no rashes Neurological: no tremor with outstretched hands, DTR normal in all  4  ASSESSMENT: 1. Hashimoto's Hypothyroidism - previously on purified thyroid extract, with over suppression of her TSH >> Palpitations >> PSVT  2. Neck compression symptoms  PLAN:  1. Patient with long-standing hypothyroidism, previously on Naturethyroid therapy (had an PSVT episode while she was using the equivalent of ~100 g of LT4. Since last visit, we switched to Synthroid, which he continues today at 50 g daily. Palpitations have greatly decreased and overall she feels better. - We discussed about correct intake of levothyroxine, fasting, with water, separated by at least 30 minutes from breakfast, and separated by more than 4 hours from calcium, iron, multivitamins, acid reflux medications (PPIs). At last visit, I advised her to move her multivitamins and PPIs at least at lunch or later. She move the multivitamins, however, she  is still taking the Nexium one hour after taking the levothyroxine. I advised her to move the Nexium at lunch, if possible. She will start doing this. - will check thyroid tests in 5 weeks: TSH, free T4 (I would like her to take the Synthroid correctly before checking the labs)  No Follow-up on file.  2. Neck compression symptoms - She describes spasms in her neck, she is wondering whether her thyroid can come to be it. No clear dysphagia, shortness of breath with decubitus or choking. Since she has a history of Hashimoto thyroiditis, I suspect that inflammation in the gland can contribution to any possible neck compression symptoms, but I would like to exclude thyroid nodules. Therefore, she agrees to have a thyroid ultrasound to rule out nodules.    CLINICAL DATA: Hypothyroidism.  EXAM: THYROID ULTRASOUND  TECHNIQUE: Ultrasound examination of the thyroid gland and adjacent soft tissues was performed.  COMPARISON: None.  FINDINGS: There is non specific diffuse heterogeneity of the thyroid parenchyma with potential increased hyperemia (representative  images 12 and 25).  Right thyroid lobe  Measurements: Normal in size measuring 4.1 x 1.6 x 1.9 cm.  There is diffuse heterogeneity of the right lobe of the thyroid without discrete nodule or mass.  Left thyroid lobe  Measurements: Normal in size measuring 4.1 x 1.6 x 1.4 cm.  There is diffuse heterogeneity the left lobe of the thyroid without discrete nodule or mass.  Isthmus  Thickness: Normal in size measuring 0.3 cm in diameter.  No discrete nodules identified within the thyroid isthmus.  Lymphadenopathy  None visualized.  IMPRESSION: Nonspecific diffuse heterogeneity and apparent hyperemia of the thyroid gland without discrete nodule or mass.   Electronically Signed By: Sandi Mariscal M.D. On: 06/08/2014 10:50        Expected signs of inflammation in the thyroid, but no nodules.

## 2014-06-08 NOTE — Addendum Note (Signed)
Addended by: Philemon Kingdom on: 06/08/2014 01:01 PM   Modules accepted: Level of Service

## 2014-06-08 NOTE — Patient Instructions (Signed)
Please come back for labs in 5 weeks.  Take the thyroid hormone every day, with water, >30 minutes before breakfast, separated by >4 hours from acid reflux medications, calcium, iron, multivitamins.   Please move Nexium later in the day.  Please come back for a follow-up appointment in 6 months.

## 2014-06-09 ENCOUNTER — Ambulatory Visit: Payer: BC Managed Care – PPO | Admitting: Internal Medicine

## 2014-06-09 LAB — CYTOLOGY - PAP

## 2014-06-17 ENCOUNTER — Emergency Department
Admission: EM | Admit: 2014-06-17 | Discharge: 2014-06-17 | Disposition: A | Discharge status: Home / Self Care | Payer: BLUE CROSS/BLUE SHIELD | Source: Home / Self Care | Attending: Emergency Medicine | Admitting: Emergency Medicine

## 2014-06-17 ENCOUNTER — Encounter: Payer: Self-pay | Admitting: *Deleted

## 2014-06-17 DIAGNOSIS — J0101 Acute recurrent maxillary sinusitis: Secondary | ICD-10-CM

## 2014-06-17 MED ORDER — AMOXICILLIN-POT CLAVULANATE 875-125 MG PO TABS: 1.0000 | ORAL_TABLET | Freq: Two times a day (BID) | ORAL | Status: DC

## 2014-06-17 NOTE — ED Provider Notes (Signed)
CSN: [id number]     Arrival date & time 06/17/14  1442 History   First MD Initiated Contact with Patient 06/17/14 1443     Chief Complaint  Patient presents with  . Nasal Congestion  . Headache   (Consider location/radiation/quality/duration/timing/severity/associated sxs/prior Treatment) HPI SINUSITIS  Onset: 3-4 days Facial/sinus pressure with discolored nasal mucus.    Severity: moderate Tried OTC meds without significant relief.  Symptoms:  + Fever  + URI prodrome with nasal congestion + Minimal swollen neck glands + mild Sinus Headache + mild ear pressure  No Allergy symptoms No significant Sore Throat No eye symptoms     No significant Cough No chest pain No shortness of breath  No wheezing  No Abdominal Pain No Nausea No Vomiting No diarrhea  No Myalgias No focal neurologic symptoms No syncope No Rash  No Urinary symptoms         Past Medical History  Diagnosis Date  . Hypothyroidism   . Hyperlipidemia   . SVT (supraventricular tachycardia)   . GERD (gastroesophageal reflux disease)    Past Surgical History  Procedure Laterality Date  . Cesarean section  1987  . Breast surgery  1976    CYST REMOVED  . Hysteroscopy  9.14.2012    w/myomectomy  . Left thumb surgery and carpal tunnel release  9/14   Family History  Problem Relation Age of Onset  . Hypertension Mother   . Heart disease Mother   . Mitral valve prolapse Mother   . Glaucoma Mother   . Arthritis Mother   . Hypertension Father   . Heart disease Father   . Colon polyps Father   . Glaucoma Father   . Arthritis Father   . Hypertension Brother   . Mitral valve prolapse Brother   . Glaucoma Brother   . Arthritis Brother   . Arthritis Sister    History  Substance Use Topics  . Smoking status: Never Smoker   . Smokeless tobacco: Never Used  . Alcohol Use: Yes     Comment: rare   OB History    Gravida Para Term Preterm AB TAB SAB Ectopic Multiple Living   1 1 1        1      Review of Systems  All other systems reviewed and are negative.   Allergies  Epinephrine; Lipitor; and Lidocaine-epinephrine  Home Medications   Prior to Admission medications   Medication Sig Start Date End Date Taking? Authorizing Provider  amoxicillin-clavulanate (AUGMENTIN) 875-125 MG per tablet Take 1 tablet by mouth 2 (two) times daily. For 10 days. Take with food. 06/17/14   Jacqulyn Cane, MD  Cholecalciferol (VITAMIN D3) 5000 UNITS TABS Take 1 tablet by mouth daily.     Historical Provider, MD  esomeprazole (NEXIUM) 40 MG capsule Take 40 mg by mouth daily at 12 noon.    Historical Provider, MD  fish oil-omega-3 fatty acids 1000 MG capsule Take 1 g by mouth daily.    Historical Provider, MD  metoprolol succinate (TOPROL-XL) 25 MG 24 hr tablet Take 25 mg by mouth every evening.    Historical Provider, MD  metoprolol succinate (TOPROL-XL) 25 MG 24 hr tablet TAKE 1 TABLET (25 MG TOTAL) BY MOUTH AT BEDTIME. 05/04/14   Darlin Coco, MD  Multiple Vitamin (MULTIVITAMIN WITH MINERALS) TABS tablet Take 1 tablet by mouth daily.    Historical Provider, MD  ondansetron (ZOFRAN ODT) 4 MG disintegrating tablet Take 1 tablet (4 mg total) by mouth every 8 (eight)  hours as needed for nausea or vomiting. 03/05/14   Kaitlyn Szekalski, PA-C  polyethylene glycol (MIRALAX) packet Take 17 g by mouth daily. 02/28/14   Fransico Meadow, PA-C  potassium chloride (K-DUR) 10 MEQ tablet 2 TABLETS DAILY 03/04/14   Darlin Coco, MD  rosuvastatin (CRESTOR) 10 MG tablet Take 10 mg by mouth 3 (three) times a week. Every Monday, Wednesday, and Friday in the evenings.    Historical Provider, MD  SYNTHROID 50 MCG tablet Take 1 tablet (50 mcg total) by mouth daily before breakfast. 03/09/14   Philemon Kingdom, MD  VERAMYST 27.5 MCG/SPRAY nasal spray Place 1 spray into the nose as needed.  04/01/13   Historical Provider, MD   BP 123/82 mmHg  Pulse 72  Temp(Src) 98 F (36.7 C) (Oral)  Resp 16  Ht 5\' 3"  (1.6  m)  Wt 164 lb (74.39 kg)  BMI 29.06 kg/m2  SpO2 99%  LMP 11/26/2000 Physical Exam  Constitutional: She is oriented to person, place, and time. She appears well-developed and well-nourished. No distress.  HENT:  Head: Normocephalic and atraumatic.  Right Ear: Tympanic membrane, external ear and ear canal normal.  Left Ear: Tympanic membrane, external ear and ear canal normal.  Nose: Mucosal edema and rhinorrhea present. Right sinus exhibits maxillary sinus tenderness. Left sinus exhibits maxillary sinus tenderness.  Mouth/Throat: Oropharynx is clear and moist. No oral lesions. No oropharyngeal exudate.  Eyes: Right eye exhibits no discharge. Left eye exhibits no discharge. No scleral icterus.  Neck: Neck supple.  Cardiovascular: Normal rate, regular rhythm and normal heart sounds.   Pulmonary/Chest: Effort normal and breath sounds normal. She has no wheezes. She has no rales.  Lymphadenopathy:    She has no cervical adenopathy.  Neurological: She is alert and oriented to person, place, and time.  Skin: Skin is warm and dry.  Nursing note and vitals reviewed.   ED Course  Procedures (including critical care time) Labs Review Labs Reviewed - No data to display  Imaging Review No results found.   MDM   1. Acute recurrent maxillary sinusitis    Treatment options discussed, as well as risks, benefits, alternatives. Patient voiced understanding and agreement with the following plans: Augmentin 875 twice a day 10 days Saline nasal spray Avoid decongestants which have caused side effects for her in the past. May continue Veramyst Other advice given Follow-up with your primary care doctor in 5-7 days if not improving, or sooner if symptoms become worse. Precautions discussed. Red flags discussed. Questions invited and answered. Patient voiced understanding and agreement.      Jacqulyn Cane, MD 06/17/14 [phone]

## 2014-06-17 NOTE — ED Notes (Signed)
Pt c/o sinus pressure/pain, HA and yellow nasal d/c in the AM x 4 days. Denies fever. She has tried Public relations account executive with no relief.

## 2014-06-18 ENCOUNTER — Telehealth: Payer: Self-pay | Admitting: *Deleted

## 2014-06-18 NOTE — ED Notes (Signed)
Pt called and said she had an tachycardia episode at 1am after taking her Augmentin at 7pm.  She was wondering if this was because of the medicine.  Dr. Assunta Found reccommended that she take Amoxicillin 875 since she has had that before with no issues.  Amox 875 BID was called in to [facility] in [place].  KG

## 2014-06-21 ENCOUNTER — Telehealth: Payer: Self-pay | Admitting: *Deleted

## 2014-06-21 NOTE — Telephone Encounter (Signed)
[place] [facility] Night - Client TELEPHONE [facility] Patient Name: [name] Gender: Female DOB: 01-May-1950 Age: 64 Y 2 M 18 D Return Phone Number: [phone] (Primary) Address: City/State/Zip: [place] [address] Client [facility] Night - Client Client Site [facility] - Night Contact Type Call Call Type Triage / Clinical Relationship To Patient Self Return Phone Number [phone] (Primary) Chief Complaint Medication reaction Initial Comment Caller states heart racing may have medication reaction [facility] Not Listed [facility] PreDisposition Go to ED Nurse Assessment Nurse: Windle Guard, RN, Lesa Date/Time (Eastern Time): 06/18/2014 1:04:21 PM Confirm and document reason for call. If symptomatic, describe symptoms. ---Caller states she is taking Augmentin for a sinus infection. Her heart is racing Has the patient traveled out of the country within the last 30 days? ---No Does the patient require triage? ---Yes Related visit to physician within the last 2 weeks? ---Yes Does the PT have any chronic conditions? (i.e. diabetes, asthma, etc.) ---Yes List chronic conditions. ---Hashimoto's, SVT, breakthrough tachycardia Guidelines Guideline Title Affirmed Question Affirmed Notes Nurse Date/Time (Eastern Time) Heart Rate and Heartbeat Questions [1] Heart beating very rapidly (e.g., > 140 / minute) AND [2] not present now (Exception: during exercise) Conner, RN, Lesa 06/18/2014 1:07:13 PM Disp. Time Eilene Ghazi Time) Disposition Final User 06/18/2014 1:10:16 PM See Physician within 4 Hours (or PCP triage) Yes Conner, RN, Emmaline Kluver Caller Understands: Yes Disagree/Comply: Comply PLEASE NOTE: All timestamps contained within this report are represented as [place] Standard Time. CONFIDENTIALTY NOTICE: This fax transmission is intended only for the addressee. It contains information that is  legally privileged, confidential or otherwise protected from use or disclosure. If you are not the intended recipient, you are strictly prohibited from reviewing, disclosing, copying using or disseminating any of this information or taking any action in reliance on or regarding this information. If you have received this fax in error, please notify us immediately by telephone so that we can arrange for its return to [place]. Phone: [phone], Toll-Free: [phone], Fax: [phone] Page: 2 of 2 Call Id: [id number] Care Advice Given Per Guideline SEE PHYSICIAN WITHIN 4 HOURS (or PCP triage): * IF NO PCP TRIAGE: You need to be seen. Go to _______________ (ED/UCC or office if it will be open) within the next 3 or 4 hours. Go sooner if you become worse. BRING MEDICINES: * Please bring a list of your current medicines when you go to see the doctor. * It is also a good idea to bring the pill bottles too. This will help the doctor to make certain you are taking the right medicines and the right dose. CALL BACK IF: * You become worse. CARE ADVICE given per Palpitations (Adult) guideline. After Care Instructions Given Call Event Type User Date / Time Description Referrals GO TO FACILITY OTHER - SPECIFY

## 2014-06-23 ENCOUNTER — Encounter: Payer: Self-pay | Admitting: Emergency Medicine

## 2014-06-23 ENCOUNTER — Emergency Department
Admission: EM | Admit: 2014-06-23 | Discharge: 2014-06-23 | Disposition: A | Discharge status: Home / Self Care | Payer: BLUE CROSS/BLUE SHIELD | Source: Home / Self Care | Attending: Emergency Medicine | Admitting: Emergency Medicine

## 2014-06-23 DIAGNOSIS — J209 Acute bronchitis, unspecified: Secondary | ICD-10-CM

## 2014-06-23 MED ORDER — PREDNISONE 20 MG PO TABS: 20.0000 mg | ORAL_TABLET | Freq: Two times a day (BID) | ORAL | Status: DC

## 2014-06-23 MED ORDER — PROMETHAZINE-CODEINE 6.25-10 MG/5ML PO SYRP: ORAL_SOLUTION | ORAL | Status: DC

## 2014-06-23 MED ORDER — AZITHROMYCIN 250 MG PO TABS
ORAL_TABLET | ORAL | Status: DC
Start: 2014-06-23 — End: 2014-11-10

## 2014-06-23 NOTE — ED Notes (Signed)
Pt was seen last week for sinus inf and she is on day 5 of her amox. States yesterday developed a non-productive cough that wont go away. States she can not sleep. Has not taken any meds.

## 2014-06-23 NOTE — ED Provider Notes (Signed)
CSN: [id number]     Arrival date & time 06/23/14  1311 History   First MD Initiated Contact with Patient 06/23/14 1334     Chief Complaint  Patient presents with  . Cough   (Consider location/radiation/quality/duration/timing/severity/associated sxs/prior Treatment) HPI Was seen here 6 days ago for sinusitis, prescribed amoxicillin, but she feels she's not significantly improving and she feels it has moved into her chest with severe, worsening cough, nonproductive and anterior chest congestion. The cough kept her up all night last night. No definite shortness of breath or wheezing. She feels fatigued but no focal neurologic symptoms. No chest pain or shortness of breath. Occasional hoarseness. No abdominal or GI symptoms. Past Medical History  Diagnosis Date  . Hypothyroidism   . Hyperlipidemia   . SVT (supraventricular tachycardia)   . GERD (gastroesophageal reflux disease)    Past Surgical History  Procedure Laterality Date  . Cesarean section  1987  . Breast surgery  1976    CYST REMOVED  . Hysteroscopy  9.14.2012    w/myomectomy  . Left thumb surgery and carpal tunnel release  9/14   Family History  Problem Relation Age of Onset  . Hypertension Mother   . Heart disease Mother   . Mitral valve prolapse Mother   . Glaucoma Mother   . Arthritis Mother   . Hypertension Father   . Heart disease Father   . Colon polyps Father   . Glaucoma Father   . Arthritis Father   . Hypertension Brother   . Mitral valve prolapse Brother   . Glaucoma Brother   . Arthritis Brother   . Arthritis Sister    History  Substance Use Topics  . Smoking status: Never Smoker   . Smokeless tobacco: Never Used  . Alcohol Use: Yes     Comment: rare   OB History    Gravida Para Term Preterm AB TAB SAB Ectopic Multiple Living   1 1 1       1      Review of Systems  All other systems reviewed and are negative.   Allergies  Epinephrine; Lipitor; and Lidocaine-epinephrine  Home Medications    Prior to Admission medications   Medication Sig Start Date End Date Taking? Authorizing Provider  amoxicillin-clavulanate (AUGMENTIN) 875-125 MG per tablet Take 1 tablet by mouth 2 (two) times daily. For 10 days. Take with food. 06/17/14   Jacqulyn Cane, MD  azithromycin (ZITHROMAX Z-PAK) 250 MG tablet Take 2 tablets on day one, then 1 tablet daily on days 2 through 5 06/23/14   Jacqulyn Cane, MD  Cholecalciferol (VITAMIN D3) 5000 UNITS TABS Take 1 tablet by mouth daily.     Historical Provider, MD  esomeprazole (NEXIUM) 40 MG capsule Take 40 mg by mouth daily at 12 noon.    Historical Provider, MD  fish oil-omega-3 fatty acids 1000 MG capsule Take 1 g by mouth daily.    Historical Provider, MD  metoprolol succinate (TOPROL-XL) 25 MG 24 hr tablet Take 25 mg by mouth every evening.    Historical Provider, MD  metoprolol succinate (TOPROL-XL) 25 MG 24 hr tablet TAKE 1 TABLET (25 MG TOTAL) BY MOUTH AT BEDTIME. 05/04/14   Darlin Coco, MD  Multiple Vitamin (MULTIVITAMIN WITH MINERALS) TABS tablet Take 1 tablet by mouth daily.    Historical Provider, MD  ondansetron (ZOFRAN ODT) 4 MG disintegrating tablet Take 1 tablet (4 mg total) by mouth every 8 (eight) hours as needed for nausea or vomiting. 03/05/14   Verline Lema  Szekalski, PA-C  polyethylene glycol (MIRALAX) packet Take 17 g by mouth daily. 02/28/14   Fransico Meadow, PA-C  potassium chloride (K-DUR) 10 MEQ tablet 2 TABLETS DAILY 03/04/14   Darlin Coco, MD  predniSONE (DELTASONE) 20 MG tablet Take 1 tablet (20 mg total) by mouth 2 (two) times daily with a meal. 06/23/14   Jacqulyn Cane, MD  promethazine-codeine [facility] WITH CODEINE) 6.25-10 MG/5ML syrup Take 1-2 teaspoons every 4-6 hours as needed for cough. May cause drowsiness. 06/23/14   Jacqulyn Cane, MD  rosuvastatin (CRESTOR) 10 MG tablet Take 10 mg by mouth 3 (three) times a week. Every Monday, Wednesday, and Friday in the evenings.    Historical Provider, MD  SYNTHROID 50 MCG tablet Take 1  tablet (50 mcg total) by mouth daily before breakfast. 03/09/14   Philemon Kingdom, MD  VERAMYST 27.5 MCG/SPRAY nasal spray Place 1 spray into the nose as needed.  04/01/13   Historical Provider, MD   BP 126/78 mmHg  Pulse 76  Temp(Src) 98.4 F (36.9 C) (Oral)  SpO2 100%  LMP 11/26/2000 Physical Exam  Constitutional: She is oriented to person, place, and time. She appears well-developed and well-nourished. No distress.  HENT:  Head: Normocephalic and atraumatic.  Right Ear: Tympanic membrane normal.  Left Ear: Tympanic membrane normal.  Mouth/Throat: Oropharynx is clear and moist. No oropharyngeal exudate.  Nose mildly congested, mild seromucoid drainage  Eyes: Right eye exhibits no discharge. Left eye exhibits no discharge. No scleral icterus.  Neck: Neck supple.  Cardiovascular: Normal rate, regular rhythm and normal heart sounds.   Pulmonary/Chest: No respiratory distress. She has no wheezes. She has rhonchi. She has no rales.  Lymphadenopathy:    She has no cervical adenopathy.  Neurological: She is alert and oriented to person, place, and time.  Skin: Skin is warm and dry.  Nursing note and vitals reviewed.   ED Course  Procedures (including critical care time) Labs Review Labs Reviewed - No data to display  Imaging Review No results found.   MDM   1. Acute bronchitis, unspecified organism    Treatment options discussed, as well as risks, benefits, alternatives. Patient voiced understanding and agreement with the following plans: Finish the amoxicillin prescription Add Mucinex OTC Discharge Medication List as of 06/23/2014  1:48 PM    START taking these medications   Details  azithromycin (ZITHROMAX Z-PAK) 250 MG tablet Take 2 tablets on day one, then 1 tablet daily on days 2 through 5, Print    predniSONE (DELTASONE) 20 MG tablet Take 1 tablet (20 mg total) by mouth 2 (two) times daily with a meal., Starting 06/23/2014, Until Discontinued, Print     promethazine-codeine (PHENERGAN WITH CODEINE) 6.25-10 MG/5ML syrup Take 1-2 teaspoons every 4-6 hours as needed for cough. May cause drowsiness., Print       Follow-up with your primary care doctor in 5-7 days if not improving, or sooner if symptoms become worse. Precautions discussed. Red flags discussed. Questions invited and answered. Patient voiced understanding and agreement.     Jacqulyn Cane, MD 06/23/14 [phone]

## 2014-06-27 ENCOUNTER — Ambulatory Visit (INDEPENDENT_AMBULATORY_CARE_PROVIDER_SITE_OTHER): Payer: BLUE CROSS/BLUE SHIELD | Admitting: Gynecology

## 2014-06-27 ENCOUNTER — Encounter: Payer: Self-pay | Admitting: Internal Medicine

## 2014-06-27 ENCOUNTER — Other Ambulatory Visit: Payer: Self-pay | Admitting: *Deleted

## 2014-06-27 ENCOUNTER — Encounter: Payer: Self-pay | Admitting: Gynecology

## 2014-06-27 VITALS — BP 130/86

## 2014-06-27 DIAGNOSIS — R8789 Other abnormal findings in specimens from female genital organs: Secondary | ICD-10-CM

## 2014-06-27 DIAGNOSIS — B977 Papillomavirus as the cause of diseases classified elsewhere: Secondary | ICD-10-CM | POA: Diagnosis not present

## 2014-06-27 DIAGNOSIS — R87618 Other abnormal cytological findings on specimens from cervix uteri: Secondary | ICD-10-CM

## 2014-06-27 MED ORDER — SYNTHROID 50 MCG PO TABS: 50.0000 ug | ORAL_TABLET | Freq: Every day | ORAL | Status: DC

## 2014-06-27 MED ORDER — NONFORMULARY OR COMPOUNDED ITEM: Status: DC

## 2014-06-27 NOTE — Progress Notes (Signed)
   Patient is a 64 year old who presented to the office today for colposcopic evaluation as a result of an abnormal Pap smear at time of her annual exam 02/05/2015. Patient's currently being followed by her medical endocrinologist for her Hashimoto's thyroiditis. Patient has tried recently as she replacement therapy but could not tolerate it orally.  Results of her recent Pap smear demonstrated the following:  Normal cytology but positive for HPV 16  Patient denied any past history of any abnormal Pap smears in the past.  Patient with a detail colposcopic evaluation of the external genitalia, perineum and perirectal region. A speculum was then introduced into the vagina. Her vagina was very friable on contact from severe atrophy. Acetic acid was then applied. Leukoplakic area was noted at the left vaginal fornix. No lesions are seen on the cervix. Endocervical speculum demonstrated full visualization of the transformation zone. 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 Colposcopic directed biopsy of the left vaginal fornix and ECC obtained. Patient will be placed on estrogen vaginal cream twice a week for the next 3 months she will return back to the office we'll repeat the colposcopy and look at the right vaginal fornix which was slightly friable that we were not able to complete due to patient's discomfort and friability and bleeding from the vagina.

## 2014-06-27 NOTE — Addendum Note (Signed)
Addended by: Thurnell Garbe A on: 06/27/2014 02:56 PM   Modules accepted: Orders

## 2014-06-27 NOTE — Patient Instructions (Signed)

## 2014-07-01 ENCOUNTER — Encounter: Payer: Self-pay | Admitting: Gynecology

## 2014-07-06 ENCOUNTER — Encounter: Payer: Self-pay | Admitting: Internal Medicine

## 2014-07-07 ENCOUNTER — Other Ambulatory Visit (INDEPENDENT_AMBULATORY_CARE_PROVIDER_SITE_OTHER): Payer: BLUE CROSS/BLUE SHIELD

## 2014-07-07 DIAGNOSIS — E039 Hypothyroidism, unspecified: Secondary | ICD-10-CM

## 2014-07-07 LAB — TSH: TSH: 2.22 u[IU]/mL (ref 0.35–4.50)

## 2014-07-07 LAB — T4, FREE: Free T4: 0.67 ng/dL (ref 0.60–1.60)

## 2014-07-15 ENCOUNTER — Other Ambulatory Visit: Payer: BLUE CROSS/BLUE SHIELD

## 2014-07-26 ENCOUNTER — Other Ambulatory Visit: Payer: Self-pay | Admitting: *Deleted

## 2014-07-26 MED ORDER — SYNTHROID 50 MCG PO TABS: 50.0000 ug | ORAL_TABLET | Freq: Every day | ORAL | Status: DC

## 2014-07-26 NOTE — Telephone Encounter (Signed)
Ins is requiring a 90 day supply of Synthroid. Sending in a new refill.

## 2014-07-27 ENCOUNTER — Encounter: Payer: Self-pay | Admitting: Internal Medicine

## 2014-08-01 ENCOUNTER — Telehealth: Payer: Self-pay | Admitting: *Deleted

## 2014-08-01 DIAGNOSIS — R3915 Urgency of urination: Secondary | ICD-10-CM

## 2014-08-01 NOTE — Telephone Encounter (Signed)
Pt called requesting to speak with you personally regarding C & B procedure on 06/27/14. Pt is aware you are out of the office, states maybe a early UTI may be starting c/o urgency, unable to make [place] leaving town on wednesday. Will leave U/A tomorrow to r/o UTI. You may reach patient at her # in system.

## 2014-08-02 ENCOUNTER — Other Ambulatory Visit: Payer: BLUE CROSS/BLUE SHIELD

## 2014-08-02 NOTE — Telephone Encounter (Signed)
Telephone call, we'll get a clean catch UA today. States C&B appointment very painful and had pain for several weeks following. Does not want to have a repeat C&B, will recheck Pap with high risk HPV typing. History of normal Paps.

## 2014-08-08 ENCOUNTER — Telehealth: Payer: Self-pay

## 2014-08-08 NOTE — Telephone Encounter (Signed)
Patient called for her results from last week.  I looked in her chart and U/a was ordered and she did come to the office on 08/02/14 but I did not see a result.  I asked Elmyra Ricks to check and she said it was never done and she is not sure why as she was not the one working that day.  I called patient back and let her know and she was disappointed and sounded frustrated. She said it would be next week before she could come again. I encouraged her to make lab appt and I did offer my apology but could sense her frustration still.

## 2014-08-15 ENCOUNTER — Other Ambulatory Visit: Payer: Self-pay | Admitting: Gastroenterology

## 2014-08-15 DIAGNOSIS — R9389 Abnormal findings on diagnostic imaging of other specified body structures: Secondary | ICD-10-CM

## 2014-08-24 ENCOUNTER — Other Ambulatory Visit: Payer: Self-pay | Admitting: Gastroenterology

## 2014-08-24 DIAGNOSIS — R9389 Abnormal findings on diagnostic imaging of other specified body structures: Secondary | ICD-10-CM

## 2014-08-25 ENCOUNTER — Other Ambulatory Visit: Payer: Self-pay | Admitting: Physician Assistant

## 2014-08-25 DIAGNOSIS — K625 Hemorrhage of anus and rectum: Secondary | ICD-10-CM

## 2014-08-31 ENCOUNTER — Other Ambulatory Visit: Payer: BLUE CROSS/BLUE SHIELD

## 2014-09-06 ENCOUNTER — Encounter: Payer: Self-pay | Admitting: Gynecology

## 2014-09-06 ENCOUNTER — Encounter: Payer: Self-pay | Admitting: Women's Health

## 2014-09-06 DIAGNOSIS — H0288A Meibomian gland dysfunction right eye, upper and lower eyelids: Secondary | ICD-10-CM | POA: Insufficient documentation

## 2014-09-06 DIAGNOSIS — H2513 Age-related nuclear cataract, bilateral: Secondary | ICD-10-CM | POA: Insufficient documentation

## 2014-09-06 DIAGNOSIS — Z8639 Personal history of other endocrine, nutritional and metabolic disease: Secondary | ICD-10-CM | POA: Insufficient documentation

## 2014-09-06 DIAGNOSIS — H16223 Keratoconjunctivitis sicca, not specified as Sjogren's, bilateral: Secondary | ICD-10-CM | POA: Insufficient documentation

## 2014-09-06 DIAGNOSIS — H0288B Meibomian gland dysfunction left eye, upper and lower eyelids: Secondary | ICD-10-CM

## 2014-09-06 DIAGNOSIS — H40033 Anatomical narrow angle, bilateral: Secondary | ICD-10-CM | POA: Insufficient documentation

## 2014-09-09 ENCOUNTER — Other Ambulatory Visit: Payer: Self-pay | Admitting: Physician Assistant

## 2014-09-09 ENCOUNTER — Encounter: Payer: Self-pay | Admitting: Cardiology

## 2014-09-09 DIAGNOSIS — Z86010 Personal history of colonic polyps: Secondary | ICD-10-CM

## 2014-09-09 DIAGNOSIS — K625 Hemorrhage of anus and rectum: Secondary | ICD-10-CM

## 2014-09-13 ENCOUNTER — Ambulatory Visit
Admission: RE | Admit: 2014-09-13 | Discharge: 2014-09-13 | Disposition: A | Discharge status: Home / Self Care | Payer: BLUE CROSS/BLUE SHIELD | Source: Ambulatory Visit | Attending: Gastroenterology | Admitting: Gastroenterology

## 2014-09-13 DIAGNOSIS — R9389 Abnormal findings on diagnostic imaging of other specified body structures: Secondary | ICD-10-CM

## 2014-09-13 MED ORDER — GADOBENATE DIMEGLUMINE 529 MG/ML IV SOLN
15.0000 mL | Freq: Once | INTRAVENOUS | Status: AC | PRN
  Administered 2014-09-13: 15 mL via INTRAVENOUS

## 2014-09-16 ENCOUNTER — Other Ambulatory Visit: Payer: BLUE CROSS/BLUE SHIELD

## 2014-09-21 ENCOUNTER — Ambulatory Visit
Admission: RE | Admit: 2014-09-21 | Discharge: 2014-09-21 | Disposition: A | Discharge status: Home / Self Care | Payer: BLUE CROSS/BLUE SHIELD | Source: Ambulatory Visit | Attending: Physician Assistant | Admitting: Physician Assistant

## 2014-09-21 DIAGNOSIS — K625 Hemorrhage of anus and rectum: Secondary | ICD-10-CM

## 2014-09-21 DIAGNOSIS — Z86010 Personal history of colonic polyps: Secondary | ICD-10-CM

## 2014-10-14 ENCOUNTER — Telehealth: Payer: Self-pay | Admitting: *Deleted

## 2014-10-14 NOTE — Telephone Encounter (Signed)
Per Elon Alas verbal message okay for pt to cancel C&B and have repeat PAP. Front desk will cancel C&B and schedule pap.

## 2014-10-14 NOTE — Telephone Encounter (Signed)
Pt is scheduled for C&B on 10/18/14 with Dr.Fernandez, per note on 08/01/14 "will recheck Pap with high risk HPV typing". Pt asked if appointment can be changed and if you can do pap? Please advise

## 2014-10-14 NOTE — Telephone Encounter (Signed)
Dr Toney Rakes wants to repeat C&B to look at area on cervix.

## 2014-10-18 ENCOUNTER — Ambulatory Visit: Payer: BLUE CROSS/BLUE SHIELD | Admitting: Gynecology

## 2014-10-19 ENCOUNTER — Ambulatory Visit: Payer: BLUE CROSS/BLUE SHIELD | Admitting: Women's Health

## 2014-10-22 ENCOUNTER — Other Ambulatory Visit: Payer: Self-pay | Admitting: Internal Medicine

## 2014-10-25 ENCOUNTER — Encounter: Payer: Self-pay | Admitting: Women's Health

## 2014-10-25 ENCOUNTER — Ambulatory Visit: Payer: BLUE CROSS/BLUE SHIELD | Admitting: Women's Health

## 2014-10-28 ENCOUNTER — Other Ambulatory Visit: Payer: Self-pay | Admitting: Cardiology

## 2014-11-04 ENCOUNTER — Telehealth: Payer: Self-pay

## 2014-11-04 ENCOUNTER — Other Ambulatory Visit: Payer: Self-pay

## 2014-11-04 ENCOUNTER — Telehealth: Payer: Self-pay | Admitting: *Deleted

## 2014-11-04 ENCOUNTER — Other Ambulatory Visit: Payer: Self-pay | Admitting: *Deleted

## 2014-11-04 MED ORDER — METOPROLOL SUCCINATE ER 25 MG PO TB24: 25.0000 mg | ORAL_TABLET | Freq: Every evening | ORAL | Status: DC

## 2014-11-04 NOTE — Telephone Encounter (Signed)
Spoke with patient re: Metoprolol refill. She wants a 90 day rx sent in, but is in need of an appointment with Dr. Mare Ferrari. Advised her of this, and transferred her to a Network engineer. Will refill once she is scheduled. Patient voiced understanding.

## 2014-11-04 NOTE — Telephone Encounter (Signed)
Patient called and was upset that her metoprolol rx was not sent in for a ninety day supply. She stated that [facility] had sent the request to our office three times. Patient was made aware that she did not get a ninety day supply due to her being overdue for follow up with Dr Mare Ferrari. There was only one rx request for her from [facility] and it appears to be for a thirty day supply, so I am not sure where the others were sent to. She did set up an appointment to be seen, but was upset that she did not receive anything notifying her that she needed an appointment. I advised her that since she did schedule an appointment it would not be a problem to resend the rx for a ninety day supply. She stated that was okay to go ahead and resend, but maybe this was not the place for.

## 2014-11-10 ENCOUNTER — Encounter: Payer: Self-pay | Admitting: Women's Health

## 2014-11-10 ENCOUNTER — Other Ambulatory Visit (HOSPITAL_COMMUNITY)
Admission: RE | Admit: 2014-11-10 | Discharge: 2014-11-10 | Disposition: A | Discharge status: Home / Self Care | Payer: BLUE CROSS/BLUE SHIELD | Source: Ambulatory Visit | Attending: Women's Health | Admitting: Women's Health

## 2014-11-10 ENCOUNTER — Ambulatory Visit (INDEPENDENT_AMBULATORY_CARE_PROVIDER_SITE_OTHER): Payer: BLUE CROSS/BLUE SHIELD | Admitting: Women's Health

## 2014-11-10 VITALS — BP 124/80 | Ht 62.0 in | Wt 165.0 lb

## 2014-11-10 DIAGNOSIS — Z8742 Personal history of other diseases of the female genital tract: Secondary | ICD-10-CM

## 2014-11-10 DIAGNOSIS — Z1151 Encounter for screening for human papillomavirus (HPV): Secondary | ICD-10-CM | POA: Diagnosis present

## 2014-11-10 DIAGNOSIS — R87810 Cervical high risk human papillomavirus (HPV) DNA test positive: Secondary | ICD-10-CM | POA: Insufficient documentation

## 2014-11-10 DIAGNOSIS — Z01411 Encounter for gynecological examination (general) (routine) with abnormal findings: Secondary | ICD-10-CM | POA: Insufficient documentation

## 2014-11-10 NOTE — Progress Notes (Signed)
Patient ID: [name], female   DOB: November 01, 1950, 64 y.o.   MRN: [id number] Presents for Pap smear. 05/2014 at annual exam Pap  normal  with positive high risk HPV 16. 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 Has been using vaginal estrogen twice weekly. Not sexually active greater than one year. Postmenopausal with no bleeding. Had extreme pain with C&B, was planning to return for repeat colposcopy but declined and opted for only Pap with HR HPV typing. Nursing faculty at [employer]  Exam: Again reviewed colposcopy and biopsy better screening, aware. External genitalia within normal limits, speculum exam mild vaginal atrophy, repeat Pap taken.  History of normal Pap with positive HR HPV 16  Plan: Triage based on Pap results. Condoms encouraged if sexually active.

## 2014-11-14 LAB — CYTOLOGY - PAP

## 2014-12-01 ENCOUNTER — Ambulatory Visit: Payer: BLUE CROSS/BLUE SHIELD | Admitting: Gynecology

## 2014-12-01 DIAGNOSIS — H02139 Senile ectropion of unspecified eye, unspecified eyelid: Secondary | ICD-10-CM | POA: Insufficient documentation

## 2014-12-01 DIAGNOSIS — H04123 Dry eye syndrome of bilateral lacrimal glands: Secondary | ICD-10-CM | POA: Insufficient documentation

## 2014-12-01 DIAGNOSIS — H02423 Myogenic ptosis of bilateral eyelids: Secondary | ICD-10-CM | POA: Insufficient documentation

## 2014-12-01 DIAGNOSIS — H53453 Other localized visual field defect, bilateral: Secondary | ICD-10-CM | POA: Insufficient documentation

## 2014-12-01 DIAGNOSIS — H0259 Other disorders affecting eyelid function: Secondary | ICD-10-CM | POA: Insufficient documentation

## 2014-12-02 ENCOUNTER — Encounter: Payer: Self-pay | Admitting: Internal Medicine

## 2014-12-02 DIAGNOSIS — H02539 Eyelid retraction unspecified eye, unspecified lid: Secondary | ICD-10-CM | POA: Insufficient documentation

## 2014-12-03 ENCOUNTER — Encounter: Payer: Self-pay | Admitting: *Deleted

## 2014-12-03 ENCOUNTER — Emergency Department
Admission: EM | Admit: 2014-12-03 | Discharge: 2014-12-03 | Disposition: A | Discharge status: Home / Self Care | Payer: BLUE CROSS/BLUE SHIELD | Source: Home / Self Care | Attending: Family Medicine | Admitting: Family Medicine

## 2014-12-03 DIAGNOSIS — J0101 Acute recurrent maxillary sinusitis: Secondary | ICD-10-CM | POA: Diagnosis not present

## 2014-12-03 MED ORDER — DOXYCYCLINE HYCLATE 100 MG PO CAPS: 100.0000 mg | ORAL_CAPSULE | Freq: Two times a day (BID) | ORAL | Status: DC

## 2014-12-03 NOTE — Discharge Instructions (Signed)
Please take antibiotic, doxycycline, as prescribed and be sure to complete entire course even if you start to feel better to ensure infection does not come back.   You may take 400-600mg  Ibuprofen (Motrin) every 6-8 hours for fever and pain  Alternate with Tylenol  You may take 500mg  Tylenol every 4-6 hours as needed for fever and pain  Follow-up with your primary care provider next week for recheck of symptoms if not improving.  Be sure to drink plenty of fluids and rest, at least 8hrs of sleep a night, preferably more while you are sick. Return urgent care or go to closest ER if you cannot keep down fluids/signs of dehydration, fever not reducing with Tylenol, difficulty breathing/wheezing, stiff neck, worsening condition, or other concerns (see below)

## 2014-12-03 NOTE — ED Notes (Signed)
Pt c/o sinus pain, HA and eye pressure x 4 days. Afebrile.

## 2014-12-03 NOTE — ED Provider Notes (Signed)
CSN: [id number]     Arrival date & time 12/03/14  1114 History   First MD Initiated Contact with Patient 12/03/14 1136     Chief Complaint  Patient presents with  . Facial Pain  . Headache   (Consider location/radiation/quality/duration/timing/severity/associated sxs/prior Treatment) HPI The pt is a 64yo female presenting to [facility] with c/o gradually worsening sinus congestion and pressure with associated frontal headache and facial pains for the last 4 days.  Pt also reports mild sore throat and bilateral ear pressure. Headache and facial pressure is aching and sore, 4/10 at worst.  She has not tried OTC pain medication.  She reports hx of recurrent sinus infections. She has been using saline rinses, Vix vapor rub and humidifiers w/o relief.  Denies fever, chills, n/v/d or cough. No sick contacts or recent travel.  Past Medical History  Diagnosis Date  . Hypothyroidism   . Hyperlipidemia   . SVT (supraventricular tachycardia)   . GERD (gastroesophageal reflux disease)    Past Surgical History  Procedure Laterality Date  . Cesarean section  1987  . Breast surgery  1976    CYST REMOVED  . Hysteroscopy  9.14.2012    w/myomectomy  . Left thumb surgery and carpal tunnel release  9/14   Family History  Problem Relation Age of Onset  . Hypertension Mother   . Heart disease Mother   . Mitral valve prolapse Mother   . Glaucoma Mother   . Arthritis Mother   . Hypertension Father   . Heart disease Father   . Colon polyps Father   . Glaucoma Father   . Arthritis Father   . Hypertension Brother   . Mitral valve prolapse Brother   . Glaucoma Brother   . Arthritis Brother   . Arthritis Sister    Social History  Substance Use Topics  . Smoking status: Never Smoker   . Smokeless tobacco: Never Used  . Alcohol Use: Yes     Comment: rare   OB History    Gravida Para Term Preterm AB TAB SAB Ectopic Multiple Living   1 1 1       1      Review of Systems  HENT: Positive for  congestion, ear pain ( pressure), rhinorrhea, sinus pressure and sore throat. Negative for trouble swallowing and voice change.   Eyes: Positive for pain ( "pressure").  Respiratory: Negative for cough and shortness of breath.   Gastrointestinal: Negative for nausea, vomiting and diarrhea.  Musculoskeletal: Positive for neck pain ( soreness). Negative for myalgias, back pain and neck stiffness.  Neurological: Positive for headaches. Negative for dizziness and light-headedness.    Allergies  Epinephrine; Lipitor; and Lidocaine-epinephrine  Home Medications   Prior to Admission medications   Medication Sig Start Date End Date Taking? Authorizing Provider  ACANYA gel as directed. 11/02/14   Historical Provider, MD  carboxymethylcellulose (REFRESH PLUS) 0.5 % SOLN 1 drop as directed.    Historical Provider, MD  Cholecalciferol (VITAMIN D3) 5000 UNITS TABS Take 1 tablet by mouth once a week.     Historical Provider, MD  diazepam (VALIUM) 5 MG tablet as needed. 07/06/14   Historical Provider, MD  doxycycline (VIBRAMYCIN) 100 MG capsule Take 1 capsule (100 mg total) by mouth 2 (two) times daily. One po bid x 7 days 12/03/14   Noland Fordyce, PA-C  esomeprazole (NEXIUM) 40 MG capsule Take 40 mg by mouth daily at 12 noon.    Historical Provider, MD  fish oil-omega-3 fatty acids 1000  MG capsule Take 1 g by mouth daily.    Historical Provider, MD  metoprolol succinate (TOPROL-XL) 25 MG 24 hr tablet Take 1 tablet (25 mg total) by mouth every evening. 11/04/14   Darlin Coco, MD  Multiple Vitamin (MULTIVITAMIN WITH MINERALS) TABS tablet Take 1 tablet by mouth daily.    Historical Provider, MD  NONFORMULARY OR COMPOUNDED ITEM Estradiol 0.02 % 29ml prefilled applicator Sig: apply twice a week 06/27/14   Terrance Mass, MD  polyethylene glycol [facility]) packet Take 17 g by mouth daily. 02/28/14   Fransico Meadow, PA-C  potassium chloride (K-DUR) 10 MEQ tablet 2 TABLETS DAILY 03/04/14   Darlin Coco, MD   rosuvastatin (CRESTOR) 10 MG tablet Take 10 mg by mouth 3 (three) times a week. Every Monday, Wednesday, and Friday in the evenings.    Historical Provider, MD  SYNTHROID 50 MCG tablet TAKE 1 TABLET (50 MCG TOTAL) BY MOUTH DAILY BEFORE BREAKFAST. Patient taking differently: TAKE 1 TABLET (50 MCG TOTAL) BY MOUTH DAILY BEFORE BREAKFAST.ALRNATING WITH 75 10/25/14   Philemon Kingdom, MD  traMADol (ULTRAM) 50 MG tablet Take by mouth as needed. 08/21/14   Historical Provider, MD  VERAMYST 27.5 MCG/SPRAY nasal spray Place 1 spray into the nose as needed.  04/01/13   Historical Provider, MD  vitamin E 400 UNIT capsule Take 1 capsule by mouth daily.    Historical Provider, MD   BP 114/77 mmHg  Pulse 72  Temp(Src) 98.4 F (36.9 C) (Oral)  Resp 14  Wt 166 lb (75.297 kg)  SpO2 97%  LMP 11/26/2000 Physical Exam  Constitutional: She appears well-developed and well-nourished. No distress.  HENT:  Head: Normocephalic and atraumatic.  Right Ear: Hearing, tympanic membrane, external ear and ear canal normal.  Left Ear: Hearing, tympanic membrane, external ear and ear canal normal.  Nose: Mucosal edema present. Right sinus exhibits maxillary sinus tenderness. Left sinus exhibits maxillary sinus tenderness.  Mouth/Throat: Uvula is midline and mucous membranes are normal. Posterior oropharyngeal erythema present. No oropharyngeal exudate, posterior oropharyngeal edema or tonsillar abscesses.  Eyes: Conjunctivae are normal. No scleral icterus.  Neck: Normal range of motion. Neck supple.  Cardiovascular: Normal rate, regular rhythm and normal heart sounds.   Pulmonary/Chest: Effort normal and breath sounds normal. No respiratory distress. She has no wheezes. She has no rales. She exhibits no tenderness.  Abdominal: Soft. Bowel sounds are normal. She exhibits no distension and no mass. There is no tenderness. There is no rebound and no guarding.  Musculoskeletal: Normal range of motion.  Neurological: She is  alert.  Skin: Skin is warm and dry. She is not diaphoretic.  Nursing note and vitals reviewed.   ED Course  Procedures (including critical care time) Labs Review Labs Reviewed - No data to display  Imaging Review No results found.   MDM   1. Acute recurrent maxillary sinusitis    Pt with hx of recurrent sinus infections presenting to [facility] with c/o similar symptoms for 4 days, gradually worsening. Exam c/w acute maxillary sinusitis. TMs: normal. Oropharyngeal exam not c/w strep. Rx: doxycyline. Advised pt to use acetaminophen and ibuprofen as needed for fever and pain. Encouraged rest and fluids. F/u with PCP in 1 week if not improving. Return precautions provided. Pt verbalized understanding and agreement with tx plan.     Noland Fordyce, PA-C 12/03/14 1213

## 2014-12-05 NOTE — Telephone Encounter (Signed)
I will cancel the appt.

## 2014-12-07 ENCOUNTER — Ambulatory Visit: Payer: BLUE CROSS/BLUE SHIELD | Admitting: Internal Medicine

## 2014-12-07 ENCOUNTER — Telehealth: Payer: Self-pay

## 2014-12-13 ENCOUNTER — Encounter: Payer: Self-pay | Admitting: Gynecology

## 2014-12-13 ENCOUNTER — Ambulatory Visit (INDEPENDENT_AMBULATORY_CARE_PROVIDER_SITE_OTHER): Payer: BLUE CROSS/BLUE SHIELD | Admitting: Gynecology

## 2014-12-13 VITALS — BP 116/70

## 2014-12-13 DIAGNOSIS — R87810 Cervical high risk human papillomavirus (HPV) DNA test positive: Secondary | ICD-10-CM | POA: Diagnosis not present

## 2014-12-13 DIAGNOSIS — R87610 Atypical squamous cells of undetermined significance on cytologic smear of cervix (ASC-US): Secondary | ICD-10-CM

## 2014-12-13 NOTE — Progress Notes (Signed)
   Patient is a 64 year old who presented to the office for colposcopy as a result of abnormal Pap smear. Her history is as follows:  Pap smear 2012 normal Pap smear 2016 (February) positive HPV 16 Colposcopy March 2016 left vaginal fornix suspicious area pathology report demonstrated lichen sclerosus/benign ECC  Patient was started on vaginal estrogen cream twice a week and she returned in for follow-up Pap smear: Pap smear July 2016 ASCUS positive HPV 16  Patient underwent detail colposcopic evaluation of the external genitalia, perineum and perirectal region were no lesions seen. The speculum was then introduced into the vagina. The vaginal epithelium appeared much healthier than earlier this year. Patient has been applying estrogen vaginal cream twice a week. Systematic inspection of the vagina, fornix and cervix did not demonstrate any lesions. Transformation so was not visualized.  Assessment/plan: Patient with history of abnormal Pap smears described above. Most recent Pap smear ASCUS positive HPV 16 negative colposcopy today. Patient scheduled to return back in 6 months for follow-up annual exam will repeat the Pap smear with HPV screen at that time. Patient meanwhile we'll continue to apply the vaginal estrogen cream twice a week.

## 2014-12-23 ENCOUNTER — Ambulatory Visit (INDEPENDENT_AMBULATORY_CARE_PROVIDER_SITE_OTHER): Payer: BLUE CROSS/BLUE SHIELD | Admitting: Cardiology

## 2014-12-23 ENCOUNTER — Encounter: Payer: Self-pay | Admitting: Cardiology

## 2014-12-23 VITALS — BP 138/92 | HR 62 | Ht 63.0 in | Wt 165.4 lb

## 2014-12-23 DIAGNOSIS — I119 Hypertensive heart disease without heart failure: Secondary | ICD-10-CM | POA: Diagnosis not present

## 2014-12-23 DIAGNOSIS — E876 Hypokalemia: Secondary | ICD-10-CM | POA: Diagnosis not present

## 2014-12-23 MED ORDER — METOPROLOL SUCCINATE ER 25 MG PO TB24: 25.0000 mg | ORAL_TABLET | Freq: Every evening | ORAL | Status: DC

## 2014-12-23 NOTE — Patient Instructions (Signed)
Medication Instructions:  Your physician recommends that you continue on your current medications as directed. Please refer to the Current Medication list given to you today.  Labwork: LIPID CMP HEPATIC FUNCTION -  NEXT WEEK  Testing/Procedures: NONE ORDERED  Follow-Up: Your physician wants you to follow-up in: [place] DR. Mare Ferrari.   You will receive a reminder letter in the mail two months in advance. If you don't receive a letter, please call our office to schedule the follow-up appointment.   Any Other Special Instructions Will Be Listed Below (If Applicable).

## 2014-12-23 NOTE — Progress Notes (Signed)
12/23/2014 [name]   09/07/1950  [id number]  Primary Physician MCNEILL,WENDY, MD Primary Cardiologist: Dr. Mare Ferrari   Reason for Visit/CC: Follow-up for SVT; medication refills  HPI:  Patient is a 64 year old female, followed by Dr. Mare Ferrari, who presents to clinic for routine evaluation for PSVT and medication refill needs.  She has a past history of essential hypertension, HLD and a history of palpitations and tachycardia. She has had a long history of paroxysmal supraventricular tachycardia. She states that she had an ablation for SVT at [facility] in 2001. Since then she has had infrequent episodes of SVT. Many of her episodes have occurred following surgical procedures.  She has had an echocardiogram on 02/02/13 which showed normal left ventricular function with ejection fraction 55-60% and no significant abnormalities. She also has a history of Hashimoto's thyroiditis which is followed by her PCP. She has had issues with hypokalemia in the past and is on daily potassium supplementation.  In clinic, she reports that she has done well. She notes occasional flutters but denies any significant/prolonged palpitations. She reports full compliance with Metroprolol. She also reports full compliance with Crestor w/o side effects. She also denies chest pain, dyspnea, syncope/near-syncope.   Current Outpatient Prescriptions  Medication Sig Dispense Refill  . ACANYA gel Apply 1 application topically daily.     . carboxymethylcellulose (REFRESH PLUS) 0.5 % SOLN Place 1 drop into both eyes 4 (four) times daily as needed (dry eyes).    [name] esomeprazole (NEXIUM) 40 MG capsule Take 40 mg by mouth daily at 12 noon.    . fish oil-omega-3 fatty acids 1000 MG capsule Take 1 g by mouth daily.    [name] levothyroxine (SYNTHROID, LEVOTHROID) 50 MCG tablet Take one (1) tablet (50 mg total) by mouth daily before breakfast alternating with 75 mg tablet each day    . levothyroxine (SYNTHROID,  LEVOTHROID) 75 MCG tablet Take one (1) tablet (75 mg total) by mouth daily before breakfast alternating with 50 mg tablet each day    . metoprolol succinate (TOPROL-XL) 25 MG 24 hr tablet Take 1 tablet (25 mg total) by mouth every evening. 90 tablet 0  . Multiple Vitamin (MULTIVITAMIN WITH MINERALS) TABS tablet Take 1 tablet by mouth daily.    . NONFORMULARY OR COMPOUNDED ITEM Estradiol 0.02 % 26ml prefilled applicator Sig: apply twice a week 24 each 4  . polyethylene glycol (MIRALAX) packet Take 17 g by mouth daily. 14 each 0  . potassium chloride (K-DUR) 10 MEQ tablet Take 20 mEq by mouth daily.    . rosuvastatin (CRESTOR) 10 MG tablet Take 10 mg by mouth 3 (three) times a week. Every Monday, Wednesday, and Friday in the evenings.    . VERAMYST 27.5 MCG/SPRAY nasal spray Place 1 spray into the nose as needed.     . Vitamin D, Ergocalciferol, (DRISDOL) 50000 UNITS CAPS capsule Take 50,000 Units by mouth every 7 (seven) days.    . vitamin E 400 UNIT capsule Take 1 capsule by mouth daily.     No current facility-administered medications for this visit.    Allergies  Allergen Reactions  . Epinephrine   . Lipitor [Atorvastatin]     Muscle aches  . Lidocaine-Epinephrine [Lidocaine-Epinephrine] Palpitations    Social History   Social History  . Marital Status: Widowed    Spouse Name: N/A  . Number of Children: N/A  . Years of Education: N/A   Occupational History  . Not on file.   Social History Main Topics  .  Smoking status: Never Smoker   . Smokeless tobacco: Never Used  . Alcohol Use: Yes     Comment: rare  . Drug Use: No  . Sexual Activity: No     Comment: 63 YEARS OLD, NO MORE THAN 5 PARTNERS    Other Topics Concern  . Not on file   Social History Narrative     Review of Systems: General: negative for chills, fever, night sweats or weight changes.  Cardiovascular: negative for chest pain, dyspnea on exertion, edema, orthopnea, palpitations, paroxysmal nocturnal  dyspnea or shortness of breath Dermatological: negative for rash Respiratory: negative for cough or wheezing Urologic: negative for hematuria Abdominal: negative for nausea, vomiting, diarrhea, bright red blood per rectum, melena, or hematemesis Neurologic: negative for visual changes, syncope, or dizziness All other systems reviewed and are otherwise negative except as noted above.    Blood pressure 138/92, pulse 62, height 5\' 3"  (1.6 m), weight 165 lb 6.4 oz (75.025 kg), last menstrual period 11/26/2000, SpO2 98 %.  General appearance: alert, cooperative and no distress Neck: no carotid bruit and no JVD Lungs: clear to auscultation bilaterally Heart: regular rate and rhythm, S1, S2 normal, no murmur, click, rub or gallop Extremities: no LEE Pulses: 2+ and symmetric Skin: warm and dry Neurologic: Grossly normal  EKG not preformed  ASSESSMENT AND PLAN:   1. PSVT: s/p ablation in 2001. Well controlled on BB therapy w/o frequent occurrence. Continue metoprolol, 25 mg daily. Rx refilled.  2. HTN: well controlled.   3. HLD: on Crestor. Tolerating well w/o side effects. Will order FLP and HFTs.   4. Hashimoto's thyroiditis: on Synthroid. Followed by PCP.   5. H/O Hypokalemia: on supplemental K. Will check a BMP to assess levels.   PLAN  Continue current plan of care. F/U with Dr. Mare Ferrari in 6 months.   Lyda Jester PA-C 12/23/2014 4:16 PM

## 2014-12-27 ENCOUNTER — Other Ambulatory Visit: Payer: Self-pay | Admitting: *Deleted

## 2014-12-27 DIAGNOSIS — I119 Hypertensive heart disease without heart failure: Secondary | ICD-10-CM

## 2014-12-27 LAB — COMPREHENSIVE METABOLIC PANEL
ALT: 20 U/L (ref 6–29)
AST: 21 U/L (ref 10–35)
Albumin: 4.5 g/dL (ref 3.6–5.1)
Alkaline Phosphatase: 86 U/L (ref 33–130)
BUN: 18 mg/dL (ref 7–25)
CO2: 30 mmol/L (ref 20–31)
Calcium: 10.2 mg/dL (ref 8.6–10.4)
Chloride: 104 mmol/L (ref 98–110)
Creat: 0.72 mg/dL (ref 0.50–0.99)
Glucose, Bld: 89 mg/dL (ref 65–99)
Potassium: 4.5 mmol/L (ref 3.5–5.3)
Sodium: 140 mmol/L (ref 135–146)
Total Bilirubin: 0.4 mg/dL (ref 0.2–1.2)
Total Protein: 7.2 g/dL (ref 6.1–8.1)

## 2014-12-27 LAB — HEPATIC FUNCTION PANEL
ALT: 20 U/L (ref 6–29)
AST: 21 U/L (ref 10–35)
Albumin: 4.5 g/dL (ref 3.6–5.1)
Alkaline Phosphatase: 85 U/L (ref 33–130)
Bilirubin, Direct: 0.1 mg/dL (ref <=0.2)
Indirect Bilirubin: 0.4 mg/dL (ref 0.2–1.2)
Total Bilirubin: 0.5 mg/dL (ref 0.2–1.2)
Total Protein: 7.1 g/dL (ref 6.1–8.1)

## 2014-12-27 LAB — LIPID PANEL
Cholesterol: 205 mg/dL — ABNORMAL HIGH (ref 125–200)
HDL: 60 mg/dL (ref >=46)
LDL Cholesterol: 119 mg/dL (ref <130)
Total CHOL/HDL Ratio: 3.4 Ratio (ref <=5.0)
Triglycerides: 129 mg/dL (ref <150)
VLDL: 26 mg/dL (ref <30)

## 2014-12-28 ENCOUNTER — Telehealth: Payer: Self-pay

## 2014-12-28 NOTE — Telephone Encounter (Signed)
Calling patient to see when she will be coming in for labs this week, so the office can schedule her for this appointment. Left message to call back.

## 2014-12-28 NOTE — Telephone Encounter (Signed)
Patient had labs done yesterday at another lab. Informed patient that the office will call her with results when available. Patient verbalized understanding.

## 2015-01-02 ENCOUNTER — Telehealth: Payer: Self-pay | Admitting: *Deleted

## 2015-01-02 NOTE — Telephone Encounter (Signed)
Called to let the pt know that her labs were normal.  Pt advised that she had already seen them on her mychart but she appreciated the call.[name]

## 2015-02-23 ENCOUNTER — Other Ambulatory Visit: Payer: Self-pay | Admitting: Cardiology

## 2015-04-20 ENCOUNTER — Other Ambulatory Visit: Payer: Self-pay | Admitting: Cardiology

## 2015-04-23 DIAGNOSIS — N870 Mild cervical dysplasia: Secondary | ICD-10-CM

## 2015-04-23 HISTORY — DX: Mild cervical dysplasia: N87.0

## 2015-04-23 HISTORY — PX: LEEP: SHX91

## 2015-04-27 ENCOUNTER — Encounter: Payer: Self-pay | Admitting: Emergency Medicine

## 2015-04-27 ENCOUNTER — Emergency Department
Admission: EM | Admit: 2015-04-27 | Discharge: 2015-04-27 | Disposition: A | Discharge status: Home / Self Care | Payer: BLUE CROSS/BLUE SHIELD | Source: Home / Self Care

## 2015-04-27 DIAGNOSIS — J329 Chronic sinusitis, unspecified: Secondary | ICD-10-CM

## 2015-04-27 DIAGNOSIS — R350 Frequency of micturition: Secondary | ICD-10-CM | POA: Diagnosis not present

## 2015-04-27 DIAGNOSIS — R319 Hematuria, unspecified: Secondary | ICD-10-CM | POA: Diagnosis not present

## 2015-04-27 LAB — POCT URINALYSIS DIP (MANUAL ENTRY)
Bilirubin, UA: NEGATIVE
Glucose, UA: NEGATIVE
Ketones, POC UA: NEGATIVE
Leukocytes, UA: NEGATIVE
Nitrite, UA: NEGATIVE
Protein Ur, POC: NEGATIVE
Spec Grav, UA: 1.025
Urobilinogen, UA: 0.2
pH, UA: 6.5

## 2015-04-27 MED ORDER — CEFDINIR 300 MG PO CAPS: 300.0000 mg | ORAL_CAPSULE | Freq: Two times a day (BID) | ORAL | Status: DC

## 2015-04-27 NOTE — ED Notes (Signed)
Sinus pain pressure in head, sore throat, hx sinus infections Polyuria x 4 days

## 2015-04-27 NOTE — ED Provider Notes (Signed)
CSN: [id number]     Arrival date & time 04/27/15  1918 History   None    Chief Complaint  Patient presents with  . Sinus Problem    Pressure, pain behind eyes, head, sneezing x 4 days  . Polyuria    Urinary frequency x 4 days   (Consider location/radiation/quality/duration/timing/severity/associated sxs/prior Treatment) HPI  Pt is a 65yo female presenting to [facility] with c/o 4 day hx of worsening sinus congestion and pressure with frontal headaches, sore throat, post-nasal drip and sneezing.  She has been using her Flonase, Zyrtec, and Veramyst with no relief of symptoms. Symptoms are moderate in severity. Her daughter is also sick.  She denies cough, chest pain or SOB.  She also report 5 day hx of urinary frequency but denies pain with urination.  Pt also denies hematuria. Denies fever, chills, n/v/d. Denies abdominal pain or back pain.  Past Medical History  Diagnosis Date  . Hypothyroidism   . Hyperlipidemia   . SVT (supraventricular tachycardia) ([place])   . GERD (gastroesophageal reflux disease)    Past Surgical History  Procedure Laterality Date  . Cesarean section  1987  . Breast surgery  1976    CYST REMOVED  . Hysteroscopy  9.14.2012    w/myomectomy  . Left thumb surgery and carpal tunnel release  9/14   Family History  Problem Relation Age of Onset  . Hypertension Mother   . Heart disease Mother   . Mitral valve prolapse Mother   . Glaucoma Mother   . Arthritis Mother   . Hypertension Father   . Heart disease Father   . Colon polyps Father   . Glaucoma Father   . Arthritis Father   . Hypertension Brother   . Mitral valve prolapse Brother   . Glaucoma Brother   . Arthritis Brother   . Arthritis Sister    Social History  Substance Use Topics  . Smoking status: Never Smoker   . Smokeless tobacco: Never Used  . Alcohol Use: Yes     Comment: rare   OB History    Gravida Para Term Preterm AB TAB SAB Ectopic Multiple Living   1 1 1       1      Review of Systems   Constitutional: Negative for fever and chills.  HENT: Positive for congestion, ear pain (pressure), postnasal drip, rhinorrhea, sinus pressure, sneezing and sore throat. Negative for trouble swallowing and voice change.   Respiratory: Negative for cough and shortness of breath.   Gastrointestinal: Negative for nausea, vomiting, abdominal pain and diarrhea.  Genitourinary: Positive for urgency and frequency. Negative for dysuria, hematuria, decreased urine volume and pelvic pain.  Musculoskeletal: Negative for myalgias and back pain.  Neurological: Positive for headaches. Negative for dizziness and light-headedness.    Allergies  Epinephrine; Lipitor; and Lidocaine-epinephrine  Home Medications   Prior to Admission medications   Medication Sig Start Date End Date Taking? Authorizing Provider  cetirizine (ZYRTEC) 10 MG tablet Take 10 mg by mouth daily.   Yes Historical Provider, MD  fluticasone (FLONASE) 50 MCG/ACT nasal spray Place into both nostrils daily.   Yes Historical Provider, MD  ACANYA gel Apply 1 application topically daily.  11/02/14   Historical Provider, MD  carboxymethylcellulose (REFRESH PLUS) 0.5 % SOLN Place 1 drop into both eyes 4 (four) times daily as needed (dry eyes).    Historical Provider, MD  cefdinir (OMNICEF) 300 MG capsule Take 1 capsule (300 mg total) by mouth 2 (two) times daily.  For 10 days 04/27/15   Noland Fordyce, PA-C  esomeprazole (NEXIUM) 40 MG capsule Take 40 mg by mouth daily at 12 noon.    Historical Provider, MD  fish oil-omega-3 fatty acids 1000 MG capsule Take 1 g by mouth daily.    Historical Provider, MD  KLOR-CON 10 10 MEQ tablet TAKE 2 TABLETS BY MOUTH EVERY DAY 02/23/15   Darlin Coco, MD  levothyroxine (SYNTHROID, LEVOTHROID) 50 MCG tablet Take one (1) tablet (50 mg total) by mouth daily before breakfast alternating with 75 mg tablet each day    Historical Provider, MD  levothyroxine (SYNTHROID, LEVOTHROID) 75 MCG tablet Take one (1) tablet (75  mg total) by mouth daily before breakfast alternating with 50 mg tablet each day    Historical Provider, MD  metoprolol succinate (TOPROL-XL) 25 MG 24 hr tablet TAKE 1 TABLET (25 MG TOTAL) BY MOUTH EVERY EVENING. 04/20/15   Darlin Coco, MD  Multiple Vitamin (MULTIVITAMIN WITH MINERALS) TABS tablet Take 1 tablet by mouth daily.    Historical Provider, MD  NONFORMULARY OR COMPOUNDED ITEM Estradiol 0.02 % 54ml prefilled applicator Sig: apply twice a week 06/27/14   Terrance Mass, MD  polyethylene glycol [facility]) packet Take 17 g by mouth daily. 02/28/14   Fransico Meadow, PA-C  potassium chloride (K-DUR) 10 MEQ tablet Take 20 mEq by mouth daily.    Historical Provider, MD  rosuvastatin (CRESTOR) 10 MG tablet Take 10 mg by mouth 3 (three) times a week. Every Monday, Wednesday, and Friday in the evenings.    Historical Provider, MD  VERAMYST 27.5 MCG/SPRAY nasal spray Place 1 spray into the nose as needed.  04/01/13   Historical Provider, MD  Vitamin D, Ergocalciferol, (DRISDOL) 50000 UNITS CAPS capsule Take 50,000 Units by mouth every 7 (seven) days.    Historical Provider, MD  vitamin E 400 UNIT capsule Take 1 capsule by mouth daily.    Historical Provider, MD   Meds Ordered and Administered this Visit  Medications - No data to display  BP 127/83 mmHg  Pulse 83  Temp(Src) 98.1 F (36.7 C) (Oral)  Ht 5\' 3"  (1.6 m)  Wt 165 lb (74.844 kg)  BMI 29.24 kg/m2  SpO2 97%  LMP 11/26/2000 No data found.   Physical Exam  Constitutional: She appears well-developed and well-nourished. No distress.  HENT:  Head: Normocephalic and atraumatic.  Right Ear: Hearing, tympanic membrane, external ear and ear canal normal.  Left Ear: Hearing, tympanic membrane, external ear and ear canal normal.  Nose: Mucosal edema present. Right sinus exhibits maxillary sinus tenderness. Right sinus exhibits no frontal sinus tenderness. Left sinus exhibits maxillary sinus tenderness. Left sinus exhibits no frontal  sinus tenderness.  Mouth/Throat: Uvula is midline and mucous membranes are normal. Posterior oropharyngeal erythema present. No oropharyngeal exudate, posterior oropharyngeal edema or tonsillar abscesses.  Eyes: Conjunctivae are normal. No scleral icterus.  Neck: Normal range of motion. Neck supple.  Cardiovascular: Normal rate, regular rhythm and normal heart sounds.   Pulmonary/Chest: Effort normal and breath sounds normal. No stridor. No respiratory distress. She has no wheezes. She has no rales. She exhibits no tenderness.  Abdominal: Soft. She exhibits no distension. There is no tenderness.  Musculoskeletal: Normal range of motion.  Lymphadenopathy:    She has no cervical adenopathy.  Neurological: She is alert.  Skin: Skin is warm and dry. She is not diaphoretic.  Nursing note and vitals reviewed.   ED Course  Procedures (including critical care time)  Labs Review Labs  Reviewed  POCT URINALYSIS DIP (MANUAL ENTRY) - Abnormal; Notable for the following:    Blood, UA moderate (*)    All other components within normal limits  URINE CULTURE    Imaging Review No results found.    MDM   1. Recurrent sinusitis   2. Urinary frequency   3. Hematuria    Pt is a 65yo female with hx of recurrent sinus infections c/o symptoms c/w prior sinus infections with worsening sinus pressure and facial pain. She is also c/o urinary frequency.  UA: moderate blood, otherwise unremarkable.  Will send culture.  Will start pt on Cefdinir as it will tx sinusitis and UTIs.  Advised pt to use acetaminophen and ibuprofen as needed for fever and pain. Encouraged rest and fluids. F/u with PCP in 5-7 days if not improving, sooner if worsening. Pt verbalized understanding and agreement with tx plan.      Noland Fordyce, PA-C 04/27/15 1946

## 2015-04-27 NOTE — Discharge Instructions (Signed)

## 2015-04-29 LAB — URINE CULTURE
Colony Count: NO GROWTH
Organism ID, Bacteria: NO GROWTH

## 2015-05-02 ENCOUNTER — Telehealth: Payer: Self-pay | Admitting: *Deleted

## 2015-05-17 ENCOUNTER — Telehealth: Payer: Self-pay | Admitting: Cardiology

## 2015-05-17 NOTE — Telephone Encounter (Signed)
[employer]  Pt wanted Dr Sherryl Barters recommendation for cardiologist after his retirement- pt was unable to sched appt before his lastday. Please call back and discuss.

## 2015-05-17 NOTE — Telephone Encounter (Signed)
I would recommend Dr. [name]

## 2015-05-17 NOTE — Telephone Encounter (Signed)
Pt is called in to schedule her routine follow-up appt and was just informed that Dr Mare Ferrari will be retiring at the time she will be coming in to be seen.  Pt would like for Dr Mare Ferrari to give recommendations on different Cardiologist within our practice , who can maintain her thyroid issue and SVT issue.  Informed the pt that I will most certainly route this request to Dr Mare Ferrari, and someone will follow-up with her thereafter.  Pt verbalized understanding and agrees with this plan.

## 2015-05-18 NOTE — Telephone Encounter (Signed)
Spoke with pt and informed her that Dr. Mare Ferrari recommended she follow up with Dr. [name]. Scheduled pt to see Dr. [name] 4/3.

## 2015-05-26 ENCOUNTER — Other Ambulatory Visit: Payer: Self-pay | Admitting: Neurosurgery

## 2015-05-26 DIAGNOSIS — D329 Benign neoplasm of meninges, unspecified: Secondary | ICD-10-CM

## 2015-06-05 ENCOUNTER — Other Ambulatory Visit: Payer: BLUE CROSS/BLUE SHIELD

## 2015-06-06 ENCOUNTER — Ambulatory Visit
Admission: RE | Admit: 2015-06-06 | Discharge: 2015-06-06 | Disposition: A | Discharge status: Home / Self Care | Payer: BLUE CROSS/BLUE SHIELD | Source: Ambulatory Visit | Attending: Neurosurgery | Admitting: Neurosurgery

## 2015-06-06 DIAGNOSIS — D329 Benign neoplasm of meninges, unspecified: Secondary | ICD-10-CM

## 2015-06-06 MED ORDER — GADOBENATE DIMEGLUMINE 529 MG/ML IV SOLN
15.0000 mL | Freq: Once | INTRAVENOUS | Status: AC | PRN
  Administered 2015-06-06: 15 mL via INTRAVENOUS

## 2015-07-10 ENCOUNTER — Emergency Department (INDEPENDENT_AMBULATORY_CARE_PROVIDER_SITE_OTHER)
Admission: EM | Admit: 2015-07-10 | Discharge: 2015-07-10 | Disposition: A | Discharge status: Home / Self Care | Payer: BLUE CROSS/BLUE SHIELD | Source: Home / Self Care | Attending: Family Medicine | Admitting: Family Medicine

## 2015-07-10 ENCOUNTER — Encounter: Payer: Self-pay | Admitting: *Deleted

## 2015-07-10 DIAGNOSIS — S161XXA Strain of muscle, fascia and tendon at neck level, initial encounter: Secondary | ICD-10-CM

## 2015-07-10 DIAGNOSIS — M549 Dorsalgia, unspecified: Secondary | ICD-10-CM

## 2015-07-10 DIAGNOSIS — M546 Pain in thoracic spine: Secondary | ICD-10-CM

## 2015-07-10 MED ORDER — TRAMADOL HCL 50 MG PO TABS: 50.0000 mg | ORAL_TABLET | Freq: Four times a day (QID) | ORAL | Status: DC | PRN

## 2015-07-10 MED ORDER — METHOCARBAMOL 500 MG PO TABS: 500.0000 mg | ORAL_TABLET | Freq: Two times a day (BID) | ORAL | Status: DC
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
## 2015-07-10 NOTE — Discharge Instructions (Signed)
Tramadol is strong pain medication. While taking, do not drink alcohol, drive, or perform any other activities that requires focus while taking these medications.   Methocarbamol (Robaxin) is a muscle relaxer and may cause drowsiness. Do not drink alcohol, drive, or operate heavy machinery while taking.

## 2015-07-22 ENCOUNTER — Other Ambulatory Visit: Payer: Self-pay | Admitting: Cardiology

## 2015-07-24 ENCOUNTER — Ambulatory Visit: Payer: BLUE CROSS/BLUE SHIELD | Admitting: Cardiology

## 2015-07-25 ENCOUNTER — Other Ambulatory Visit: Payer: Self-pay | Admitting: *Deleted

## 2015-07-25 MED ORDER — METOPROLOL SUCCINATE ER 25 MG PO TB24: ORAL_TABLET | ORAL | Status: DC

## 2015-07-25 NOTE — Telephone Encounter (Signed)
Per pharmacy call, patients insurance requires a ninety day supply.

## 2015-08-23 ENCOUNTER — Ambulatory Visit: Payer: BLUE CROSS/BLUE SHIELD | Admitting: Cardiology

## 2015-09-11 ENCOUNTER — Encounter: Payer: Self-pay | Admitting: Cardiology

## 2015-09-11 ENCOUNTER — Ambulatory Visit (INDEPENDENT_AMBULATORY_CARE_PROVIDER_SITE_OTHER): Payer: BLUE CROSS/BLUE SHIELD | Admitting: Cardiology

## 2015-09-11 VITALS — BP 116/84 | HR 68 | Ht 63.0 in | Wt 160.0 lb

## 2015-09-11 DIAGNOSIS — I471 Supraventricular tachycardia: Secondary | ICD-10-CM

## 2015-09-11 DIAGNOSIS — E876 Hypokalemia: Secondary | ICD-10-CM

## 2015-09-11 DIAGNOSIS — I119 Hypertensive heart disease without heart failure: Secondary | ICD-10-CM | POA: Diagnosis not present

## 2015-09-11 MED ORDER — METOPROLOL SUCCINATE ER 25 MG PO TB24: ORAL_TABLET | ORAL | Status: DC

## 2015-09-11 NOTE — Progress Notes (Signed)
09/11/2015 [name]   Nov 02, 1950  [id number]  Primary Physician MCNEILL,[name] Primary Cardiologist: Dr. Mare Potts   Reason for Visit/CC: Follow-up for SVT; medication refills  HPI:  Patient is a 65 year old female, followed by Dr. Mare Potts, who presents to clinic for routine evaluation for PSVT and medication refill needs.  She has a past history of essential hypertension, HLD and a history of palpitations and tachycardia. She has had a long history of paroxysmal supraventricular tachycardia. She states that she had an ablation for SVT at [facility] in 2001. Since then she has had infrequent episodes of SVT. Many of her episodes have occurred following surgical procedures. She has had an echocardiogram on 02/02/13 which showed normal left ventricular function with ejection fraction 55-60% and no significant abnormalities. She also has a history of Hashimoto's thyroiditis which is followed by her PCP. She has had issues with hypokalemia in the past and is on daily potassium supplementation. She was last seen 12/2014 and was stable w/o recurrence of palpitations. She was stable from a cardiac standpoint.   In clinic today, she reports that she has done well. She notes occasional flutters but denies any significant/prolonged palpitations. She reports full compliance with Metroprolol. She also reports full compliance with Crestor w/o side effects. She also denies chest pain, dyspnea, syncope/near-syncope. Her potassium levels have remained stable over recent years with supplementation. She has been on a low dose, 10 mEq daily. She wishes to come off her her daily potassium if possible and see how she does through diet along.    Current Outpatient Prescriptions  Medication Sig Dispense Refill  . cetirizine (ZYRTEC) 10 MG tablet Take 10 mg by mouth daily.    [name] esomeprazole (NEXIUM) 40 MG capsule Take 40 mg by mouth daily at 12 noon.    . fish oil-omega-3 fatty acids 1000 MG  capsule Take 1 g by mouth daily.    . fluticasone (FLONASE) 50 MCG/ACT nasal spray Place into both nostrils daily.    [name] levothyroxine (SYNTHROID, LEVOTHROID) 50 MCG tablet Take one (1) tablet (50 mg total) by mouth daily before breakfast alternating with 75 mg tablet each day    . methocarbamol (ROBAXIN) 500 MG tablet Take 1 tablet (500 mg total) by mouth 2 (two) times daily. 20 tablet 0  . metoprolol succinate (TOPROL-XL) 25 MG 24 hr tablet TAKE 1 TABLET (25 MG TOTAL) BY MOUTH EVERY EVENING. 90 tablet 3  . Multiple Vitamin (MULTIVITAMIN WITH MINERALS) TABS tablet Take 1 tablet by mouth daily.    . NONFORMULARY OR COMPOUNDED ITEM Estradiol 0.02 % 23ml prefilled applicator Sig: apply twice a week 24 each 4  . polyethylene glycol (MIRALAX) packet Take 17 g by mouth daily. 14 each 0  . Probiotic Product (PROBIOTIC ADVANCED PO) Take by mouth daily.    . rosuvastatin (CRESTOR) 10 MG tablet Take 10 mg by mouth 3 (three) times a week. Every Monday, Wednesday, and Friday in the evenings.    . VERAMYST 27.5 MCG/SPRAY nasal spray Place 1 spray into the nose as needed.     . Vitamin D, Ergocalciferol, (DRISDOL) 50000 UNITS CAPS capsule Take 50,000 Units by mouth every 7 (seven) days.    . vitamin E 400 UNIT capsule Take 1 capsule by mouth daily.     No current facility-administered medications for this visit.    Allergies  Allergen Reactions  . Epinephrine   . Lipitor [Atorvastatin]     Muscle aches  . Lidocaine-Epinephrine [Lidocaine-Epinephrine] Palpitations  Social History   Social History  . Marital Status: Widowed    Spouse Name: N/A  . Number of Children: N/A  . Years of Education: N/A   Occupational History  . Not on file.   Social History Main Topics  . Smoking status: Never Smoker   . Smokeless tobacco: Never Used  . Alcohol Use: Yes     Comment: rare  . Drug Use: No  . Sexual Activity: No     Comment: 65 YEARS OLD, NO MORE THAN 5 PARTNERS    Other Topics Concern  . Not  on file   Social History Narrative     Review of Systems: General: negative for chills, fever, night sweats or weight changes.  Cardiovascular: negative for chest pain, dyspnea on exertion, edema, orthopnea, palpitations, paroxysmal nocturnal dyspnea or shortness of breath Dermatological: negative for rash Respiratory: negative for cough or wheezing Urologic: negative for hematuria Abdominal: negative for nausea, vomiting, diarrhea, bright red blood per rectum, melena, or hematemesis Neurologic: negative for visual changes, syncope, or dizziness All other systems reviewed and are otherwise negative except as noted above.    Blood pressure 116/84, pulse 68, height 5\' 3"  (1.6 m), weight 160 lb (72.576 kg), last menstrual period 11/26/2000.  General appearance: alert, cooperative and no distress Neck: no carotid bruit and no JVD Lungs: clear to auscultation bilaterally Heart: regular rate and rhythm, S1, S2 normal, no murmur, click, rub or gallop Extremities: no LEE Pulses: 2+ and symmetric Skin: warm and dry Neurologic: Grossly normal  EKG Sinus brady 59 bpm.   ASSESSMENT AND PLAN:   1. PSVT: s/p ablation at [facility]  in 2001. Well controlled on BB therapy w/o frequent occurrence. Continue metoprolol, 25 mg daily. Rx refilled.  2. HTN: well controlled.   3. HLD: on Crestor. Tolerating well w/o side effects. FLP 12/2014 showed lipids to be at goal.   4. Hashimoto's thyroiditis: on Synthroid. Followed by PCP.   5. H/O Hypokalemia:   Her potassium levels have remained stable over recent years with supplementation. She has been on a low dose, 10 mEq daily. She wishes to come off her her daily potassium if possible and see how she does through diet along. This is reasonable. Patient can d/c supplemental K. Recheck BMP in several weeks. If low K, will instruct patient to resume.   PLAN  Patient has previously been followed by Dr. Mare Potts. She has been reassigned to  Dr. Radford Potts. F/u in 6 months.   Jessica Hopson PA-C 09/11/2015 11:02 AM

## 2015-09-11 NOTE — Patient Instructions (Signed)
Medication Instructions:  Your physician has recommended you make the following change in your medication:  1.  STOP the Potassium   Labwork: 10/09/15:  BMET  Testing/Procedures: None ordered  Follow-Up: Your physician wants you to follow-up in: [place] DR. Mallie Snooks will receive a reminder letter in the mail two months in advance. If you don't receive a letter, please call our office to schedule the follow-up appointment.   Any Other Special Instructions Will Be Listed Below (If Applicable).   If you need a refill on your cardiac medications before your next appointment, please call your pharmacy.

## 2015-09-27 ENCOUNTER — Emergency Department (HOSPITAL_COMMUNITY): Payer: BLUE CROSS/BLUE SHIELD

## 2015-09-27 ENCOUNTER — Encounter (HOSPITAL_COMMUNITY): Payer: Self-pay

## 2015-09-27 ENCOUNTER — Emergency Department (HOSPITAL_COMMUNITY)
Admission: EM | Admit: 2015-09-27 | Discharge: 2015-09-27 | Disposition: A | Discharge status: Home / Self Care | Payer: BLUE CROSS/BLUE SHIELD | Attending: Emergency Medicine | Admitting: Emergency Medicine

## 2015-09-27 DIAGNOSIS — R1011 Right upper quadrant pain: Secondary | ICD-10-CM | POA: Insufficient documentation

## 2015-09-27 DIAGNOSIS — Z79899 Other long term (current) drug therapy: Secondary | ICD-10-CM | POA: Insufficient documentation

## 2015-09-27 DIAGNOSIS — R110 Nausea: Secondary | ICD-10-CM | POA: Insufficient documentation

## 2015-09-27 LAB — I-STAT TROPONIN, ED: Troponin i, poc: 0 ng/mL (ref 0.00–0.08)

## 2015-09-27 LAB — CBC WITH DIFFERENTIAL/PLATELET
Basophils Absolute: 0 10*3/uL (ref 0.0–0.1)
Basophils Relative: 0 %
Eosinophils Absolute: 0.1 10*3/uL (ref 0.0–0.7)
Eosinophils Relative: 2 %
HCT: 36.6 % (ref 36.0–46.0)
Hemoglobin: 13.1 g/dL (ref 12.0–15.0)
Lymphocytes Relative: 48 %
Lymphs Abs: 1.9 10*3/uL (ref 0.7–4.0)
MCH: 29 pg (ref 26.0–34.0)
MCHC: 35.8 g/dL (ref 30.0–36.0)
MCV: 81 fL (ref 78.0–100.0)
Monocytes Absolute: 0.3 10*3/uL (ref 0.1–1.0)
Monocytes Relative: 6 %
Neutro Abs: 1.8 10*3/uL (ref 1.7–7.7)
Neutrophils Relative %: 44 %
Platelets: 140 10*3/uL — ABNORMAL LOW (ref 150–400)
RBC: 4.52 MIL/uL (ref 3.87–5.11)
RDW: 15 % (ref 11.5–15.5)
WBC: 4.1 10*3/uL (ref 4.0–10.5)

## 2015-09-27 LAB — COMPREHENSIVE METABOLIC PANEL
ALT: 29 U/L (ref 14–54)
AST: 27 U/L (ref 15–41)
Albumin: 4.4 g/dL (ref 3.5–5.0)
Alkaline Phosphatase: 80 U/L (ref 38–126)
Anion gap: 7 (ref 5–15)
BUN: 17 mg/dL (ref 6–20)
CO2: 26 mmol/L (ref 22–32)
Calcium: 9.6 mg/dL (ref 8.9–10.3)
Chloride: 107 mmol/L (ref 101–111)
Creatinine, Ser: 0.74 mg/dL (ref 0.44–1.00)
GFR calc Af Amer: >60 mL/min (ref >60)
GFR calc non Af Amer: >60 mL/min (ref >60)
Glucose, Bld: 104 mg/dL — ABNORMAL HIGH (ref 65–99)
Potassium: 3.6 mmol/L (ref 3.5–5.1)
Sodium: 140 mmol/L (ref 135–145)
Total Bilirubin: 0.6 mg/dL (ref 0.3–1.2)
Total Protein: 6.8 g/dL (ref 6.5–8.1)

## 2015-09-27 LAB — URINALYSIS, ROUTINE W REFLEX MICROSCOPIC
Bilirubin Urine: NEGATIVE
Glucose, UA: NEGATIVE mg/dL
Ketones, ur: NEGATIVE mg/dL
Leukocytes, UA: NEGATIVE
Nitrite: NEGATIVE
Protein, ur: NEGATIVE mg/dL
Specific Gravity, Urine: 1.005 (ref 1.005–1.030)
pH: 6.5 (ref 5.0–8.0)

## 2015-09-27 LAB — URINE MICROSCOPIC-ADD ON

## 2015-09-27 LAB — LIPASE, BLOOD: Lipase: 32 U/L (ref 11–51)

## 2015-09-27 MED ORDER — ONDANSETRON HCL 4 MG/2ML IJ SOLN
4.0000 mg | Freq: Once | INTRAMUSCULAR | Status: AC
  Administered 2015-09-27: 4 mg via INTRAVENOUS
  Filled 2015-09-27: qty 2

## 2015-09-27 MED ORDER — ONDANSETRON HCL 4 MG PO TABS: 4.0000 mg | ORAL_TABLET | Freq: Four times a day (QID) | ORAL | Status: DC

## 2015-09-27 MED ORDER — SODIUM CHLORIDE 0.9 % IV BOLUS (SEPSIS)
1000.0000 mL | Freq: Once | INTRAVENOUS | Status: AC
  Administered 2015-09-27: 1000 mL via INTRAVENOUS

## 2015-09-27 NOTE — ED Notes (Signed)
Ultrasound at bedside

## 2015-09-27 NOTE — Discharge Instructions (Signed)
Follow up with your PCP in the next few days if your symptoms do not resolve. Return to ED if you develop new, worsening or concerning symptoms.    Abdominal Pain, Adult Many things can cause abdominal pain. Usually, abdominal pain is not caused by a disease and will improve without treatment. It can often be observed and treated at home. Your health care provider will do a physical exam and possibly order blood tests and X-rays to help determine the seriousness of your pain. However, in many cases, more time must pass before a clear cause of the pain can be found. Before that point, your health care provider may not know if you need more testing or further treatment. HOME CARE INSTRUCTIONS Monitor your abdominal pain for any changes. The following actions may help to alleviate any discomfort you are experiencing:  Only take over-the-counter or prescription medicines as directed by your health care provider.  Do not take laxatives unless directed to do so by your health care provider.  Try a clear liquid diet (broth, tea, or water) as directed by your health care provider. Slowly move to a bland diet as tolerated. SEEK MEDICAL CARE IF:  You have unexplained abdominal pain.  You have abdominal pain associated with nausea or diarrhea.  You have pain when you urinate or have a bowel movement.  You experience abdominal pain that wakes you in the night.  You have abdominal pain that is worsened or improved by eating food.  You have abdominal pain that is worsened with eating fatty foods.  You have a fever. SEEK IMMEDIATE MEDICAL CARE IF:  Your pain does not go away within 2 hours.  You keep throwing up (vomiting).  Your pain is felt only in portions of the abdomen, such as the right side or the left lower portion of the abdomen.  You pass bloody or black tarry stools. MAKE SURE YOU:  Understand these instructions.  Will watch your condition.  Will get help right away if you are  not doing well or get worse.   This information is not intended to replace advice given to you by your health care provider. Make sure you discuss any questions you have with your health care provider.   Document Released: 01/16/2005 Document Revised: 12/28/2014 Document Reviewed: 12/16/2012 Elsevier Interactive Patient Education 2016 Else[employer]  Nausea, Adult Nausea is the feeling that you have an upset stomach or have to vomit. Nausea by itself is not likely a serious concern, but it may be an early sign of more serious medical problems. As nausea gets worse, it can lead to vomiting. If vomiting develops, there is the risk of dehydration.  CAUSES   Viral infections.  Food poisoning.  Medicines.  Pregnancy.  Motion sickness.  Migraine headaches.  Emotional distress.  Severe pain from any source.  Alcohol intoxication. HOME CARE INSTRUCTIONS  Get plenty of rest.  Ask your caregiver about specific rehydration instructions.  Eat small amounts of food and sip liquids more often.  Take all medicines as told by your caregiver. SEEK MEDICAL CARE IF:  You have not improved after 2 days, or you get worse.  You have a headache. SEEK IMMEDIATE MEDICAL CARE IF:   You have a fever.  You faint.  You keep vomiting or have blood in your vomit.  You are extremely weak or dehydrated.  You have dark or bloody stools.  You have severe chest or abdominal pain. MAKE SURE YOU:  Understand these instructions.  Will  watch your condition.  Will get help right away if you are not doing well or get worse.   This information is not intended to replace advice given to you by your health care provider. Make sure you discuss any questions you have with your health care provider.   Document Released: 05/16/2004 Document Revised: 04/29/2014 Document Reviewed: 12/19/2010 Elsevier Interactive Patient Education [employer].

## 2015-09-27 NOTE — ED Notes (Signed)
Pt states that she went to bed having upper right quad pain and woke up nauseated

## 2015-09-27 NOTE — ED Provider Notes (Signed)
CSN: [id number]     Arrival date & time 09/27/15  0522 History   First MD Initiated Contact with Patient 09/27/15 [phone]     Chief Complaint  Patient presents with  . Nausea   HPI  [name] is a 65 year old female with past medical history of hypothyroidism, SVT and GERD presenting with nausea and right upper quadrant abdominal pain. Patient reports intermittent abdominal pain for the past 2 days. She describes this as sharp and stabbing. Denies known exacerbating factors. Patient states she cannot remember if food exacerbates the pain. The pain quality has now changed to a pressure sensation in the right upper quadrant. She denies radiation of the pain. She has been intermittently nauseous without vomiting. She states that she woke early this morning with nausea and diaphoresis. She also notes a sensation of something stuck in the back of her throat. She states that she did not eat or take any pills prior to onset of this sensation. She follows with the gastroenterologist for GERD. She states she has been taking her medications as instructed. Denies associated chest pain or SOB. Denies abdominal surgical history. Denies fevers, chills, headache, dizziness, syncope, diarrhea, constipation, flank pain, dysuria, hematuria or vaginal discharge.   Past Medical History  Diagnosis Date  . Hypothyroidism   . Hyperlipidemia   . SVT (supraventricular tachycardia) ([place])   . GERD (gastroesophageal reflux disease)    Past Surgical History  Procedure Laterality Date  . Cesarean section  1987  . Breast surgery  1976    CYST REMOVED  . Hysteroscopy  9.14.2012    w/myomectomy  . Left thumb surgery and carpal tunnel release  9/14   Family History  Problem Relation Age of Onset  . Hypertension Mother   . Heart disease Mother   . Mitral valve prolapse Mother   . Glaucoma Mother   . Arthritis Mother   . Hypertension Father   . Heart disease Father   . Colon polyps Father   . Glaucoma Father    . Arthritis Father   . Hypertension Brother   . Mitral valve prolapse Brother   . Glaucoma Brother   . Arthritis Brother   . Arthritis Sister    Social History  Substance Use Topics  . Smoking status: Never Smoker   . Smokeless tobacco: Never Used  . Alcohol Use: Yes     Comment: rare   OB History    Gravida Para Term Preterm AB TAB SAB Ectopic Multiple Living   1 1 1       1      Review of Systems  All other systems reviewed and are negative.     Allergies  Lipitor; Epinephrine; and Lidocaine-epinephrine  Home Medications   Prior to Admission medications   Medication Sig Start Date End Date Taking? Authorizing Provider  cetirizine (ZYRTEC) 10 MG tablet Take 10 mg by mouth daily as needed for allergies.    Yes Historical Provider, MD  esomeprazole (NEXIUM) 40 MG capsule Take 40 mg by mouth daily at 12 noon.   Yes Historical Provider, MD  fish oil-omega-3 fatty acids 1000 MG capsule Take 1 g by mouth daily.   Yes Historical Provider, MD  levothyroxine (SYNTHROID, LEVOTHROID) 50 MCG tablet Take 50 mcg by mouth daily before breakfast.    Yes Historical Provider, MD  methocarbamol (ROBAXIN) 500 MG tablet Take 1 tablet (500 mg total) by mouth 2 (two) times daily. Patient taking differently: Take 500 mg by mouth 2 (two) times  daily as needed for muscle spasms.  07/10/15  Yes Noland Fordyce, PA-C  metoprolol succinate (TOPROL-XL) 25 MG 24 hr tablet TAKE 1 TABLET (25 MG TOTAL) BY MOUTH EVERY EVENING. 09/11/15  Yes Brittainy Erie Noe, PA-C  Multiple Vitamin (MULTIVITAMIN WITH MINERALS) TABS tablet Take 1 tablet by mouth daily.   Yes Historical Provider, MD  NONFORMULARY OR COMPOUNDED ITEM Estradiol 0.02 % 56ml prefilled applicator Sig: apply twice a week Patient taking differently: Apply 1 application topically as directed. Estradiol 0.02 % 34ml prefilled applicator Sig: apply twice a week as needed for low estrogen 06/27/14  Yes Terrance Mass, MD  polyethylene glycol [facility])  packet Take 17 g by mouth daily. Patient taking differently: Take 17 g by mouth daily as needed for moderate constipation.  02/28/14  Yes Hollace Kinnier Sofia, PA-C  Probiotic Product (PROBIOTIC ADVANCED PO) Take 1 capsule by mouth daily.    Yes Historical Provider, MD  rosuvastatin (CRESTOR) 10 MG tablet Take 10 mg by mouth 3 (three) times a week. Every Monday, Wednesday, and Friday in the evenings.   Yes Historical Provider, MD  VERAMYST 27.5 MCG/SPRAY nasal spray Place 1 spray into the nose daily as needed for allergies.  04/01/13  Yes Historical Provider, MD  Vitamin D, Ergocalciferol, (DRISDOL) 50000 UNITS CAPS capsule Take 50,000 Units by mouth every 7 (seven) days.   Yes Historical Provider, MD  vitamin E 400 UNIT capsule Take 1 capsule by mouth daily.   Yes Historical Provider, MD  ondansetron (ZOFRAN) 4 MG tablet Take 1 tablet (4 mg total) by mouth every 6 (six) hours. 09/27/15   Maximus Hoffert, PA-C   BP 117/73 mmHg  Pulse 57  Temp(Src) 98.2 F (36.8 C) (Oral)  Resp 13  Ht 5\' 3"  (1.6 m)  Wt 72.576 kg  BMI 28.35 kg/m2  SpO2 96%  LMP 11/26/2000 Physical Exam  Constitutional: She appears well-developed and well-nourished. No distress.  Nontoxic appearing.   HENT:  Head: Normocephalic and atraumatic.  Mouth/Throat: Oropharynx is clear and moist.  No foreign body  Eyes: Conjunctivae are normal. Right eye exhibits no discharge. Left eye exhibits no discharge. No scleral icterus.  Neck: Normal range of motion.  Cardiovascular: Normal rate, regular rhythm and normal heart sounds.   Pulmonary/Chest: Effort normal and breath sounds normal. No respiratory distress. She has no wheezes. She has no rales.  Abdominal: Soft. Bowel sounds are normal. She exhibits no distension. There is no tenderness. There is no rebound and no guarding.  Musculoskeletal: Normal range of motion.  Neurological: She is alert. Coordination normal.  Skin: Skin is warm and dry. She is not diaphoretic.  Psychiatric: She  has a normal mood and affect. Her behavior is normal.  Nursing note and vitals reviewed.   ED Course  Procedures (including critical care time) Labs Review Labs Reviewed  CBC WITH DIFFERENTIAL/PLATELET - Abnormal; Notable for the following:    Platelets 140 (*)    All other components within normal limits  COMPREHENSIVE METABOLIC PANEL - Abnormal; Notable for the following:    Glucose, Bld 104 (*)    All other components within normal limits  URINALYSIS, ROUTINE W REFLEX MICROSCOPIC (NOT AT [facility]) - Abnormal; Notable for the following:    Hgb urine dipstick TRACE (*)    All other components within normal limits  URINE MICROSCOPIC-ADD ON - Abnormal; Notable for the following:    Squamous Epithelial / LPF 0-5 (*)    Bacteria, UA FEW (*)    All other components  within normal limits  LIPASE, BLOOD  I-STAT TROPOININ, ED    Imaging Review US Abdomen Limited  09/27/2015  CLINICAL DATA:  Right upper quadrant abdominal pain and nausea. EXAM: US ABDOMEN LIMITED - RIGHT UPPER QUADRANT COMPARISON:  Abdomen MRI on 09/13/2014 FINDINGS: Gallbladder: No gallstones or wall thickening visualized. No sonographic Murphy sign noted by sonographer. Common bile duct: Diameter: 2 mm, within normal limits. Liver: Within normal limits in parenchymal echogenicity. A small benign cyst is seen in the anterior liverwhich measures 1.1 cm. In addition, there is a 1.0 cm hyperechoic lesion in the anterior liver, likely within the left lobe, consistent with a tiny benign hemangioma. These appear stable compared to previous MRI. IMPRESSION: No evidence of gallstones or biliary ductal dilatation. Stable tiny benign hepatic cyst and hemangioma. Electronically Signed   By: Earle Gell M.D.   On: 09/27/2015 07:18   I have personally reviewed and evaluated these images and lab results as part of my medical decision-making.   EKG Interpretation   Date/Time:  Wednesday September 27 2015 05:46:45 EDT Ventricular Rate:  71 PR  Interval:  167 QRS Duration: 85 QT Interval:  392 QTC Calculation: 426 R Axis:   40 Text Interpretation:  Sinus rhythm No significant change was found  Confirmed by Florina Ou  MD, Jenny Reichmann ([id number]) on 09/27/2015 5:49:41 AM Also  confirmed by Florina Ou  MD, Jenny Reichmann ([id number]), editor [place] CT, Leda Gauze [phone])   on 09/27/2015 7:07:40 AM     830: pt reports improvement in nausea and no longer has sensation of something stuck in her throat. Lab work and abdominal US reassuring. Will fluid challenge and anticipate discharge.   MDM   Final diagnoses:  RUQ abdominal pain  Nausea   65 year old female presenting with right upper quadrant abdominal pain and nausea 2 days. Afebrile and hemodynamically stable. Heart regular rate and rhythm. Lungs clear to auscultation bilaterally. Abdomen is soft, nontender without peritoneal signs. No leukocytosis. Mild thrombocytopenia which appears to be her baseline. CMP and lipase are reassuring. Troponin negative and nonischemic EKG. UA is not suggestive of infection. Ultrasound of the right upper quadrant negative for acute disease processes. Given fluid bolus and Zofran. Patient reports resolution of symptoms after this. Patient tolerating PO. Patient is low heart score I do not believe this is cardiac in etiology. Will discharge with antiemetics and follow-up with her gastroenterologist. Return precautions given in discharge paperwork and discussed with pt at bedside. Pt stable for discharge     Josephina Gip, PA-C 09/27/15 [id number]  Shanon Rosser, MD 09/27/15 [phone]

## 2015-09-27 NOTE — ED Notes (Signed)
Pt also feels like something is stuck in her throat

## 2015-10-03 ENCOUNTER — Ambulatory Visit (INDEPENDENT_AMBULATORY_CARE_PROVIDER_SITE_OTHER): Payer: BLUE CROSS/BLUE SHIELD | Admitting: Women's Health

## 2015-10-03 ENCOUNTER — Encounter: Payer: Self-pay | Admitting: Women's Health

## 2015-10-03 VITALS — Wt 160.0 lb

## 2015-10-03 DIAGNOSIS — Z01419 Encounter for gynecological examination (general) (routine) without abnormal findings: Secondary | ICD-10-CM

## 2015-10-03 NOTE — Progress Notes (Signed)
[name] August 07, 1950 [id number]    History:    Presents for annual exam.  Postmenopausal on no HRT not sexually active. 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 2016 T score -1.4, FRAX 3.1%/0.3%. Primary care manages hypercholesterolemia and hypothyroidism. 2010 benign colon polyp had a barium study last year negative for polyps history of SVT after anesthesia. 08/2015 having right upper quadrant pain had negative gallbladder study.  Past medical history, past surgical history, family history and social history were all reviewed and documented in the EPIC chart. 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  ROS:  A ROS was performed and pertinent positives and negatives are included.  Exam:  There were no vitals filed for this visit.  General appearance:  Normal Thyroid:  Symmetrical, normal in size, without palpable masses or nodularity. Respiratory  Auscultation:  Clear without wheezing or rhonchi Cardiovascular  Auscultation:  Regular rate, without rubs, murmurs or gallops  Edema/varicosities:  Not grossly evident Abdominal  Soft,nontender, without masses, guarding or rebound.  Liver/spleen:  No organomegaly noted  Hernia:  None appreciated  Skin  Inspection:  Grossly normal   Breasts: Examined lying and sitting.     Right: Without masses, retractions, discharge or axillary adenopathy.     Left: Without masses, retractions, discharge or axillary adenopathy. Gentitourinary   Inguinal/mons:  Normal without inguinal adenopathy  External genitalia:  Normal  BUS/Urethra/Skene's glands:  Normal  Vagina:  Normal  Cervix:  Normal  Uterus:   normal in size, shape and contour.  Midline and mobile  Adnexa/parametria:     Rt: Without masses or tenderness.   Lt: Without masses or tenderness.  Anus and perineum: Normal  Digital rectal exam: Normal sphincter tone without palpated masses or tenderness  Assessment/Plan:  65 y.o. SBF G1 P1  for annual exam no complaints.  Postmenopausal/no HRT/no bleeding 2016 ascus with positive high risk #16 - negative colposcopy and biopsy Hypercholesterolemia/hyperthyroidism-primary care manages labs and meds Osteopenia without elevated FRAX  Plan:  Pap with HR HPV typing. SBE's, continue active lifestyle of exercise of walking, Zumba. Home safety, fall prevention, vitamin D 2000 daily encouraged. DEXA 2018.   [place], 1:45 PM 10/03/2015

## 2015-10-03 NOTE — Patient Instructions (Signed)

## 2015-10-05 LAB — PAP, TP IMAGING W/ HPV RNA, RFLX HPV TYPE 16,18/45: HPV mRNA, High Risk: DETECTED — AB

## 2015-10-09 ENCOUNTER — Other Ambulatory Visit: Payer: BLUE CROSS/BLUE SHIELD

## 2015-10-12 ENCOUNTER — Other Ambulatory Visit (INDEPENDENT_AMBULATORY_CARE_PROVIDER_SITE_OTHER): Payer: BLUE CROSS/BLUE SHIELD | Admitting: *Deleted

## 2015-10-12 DIAGNOSIS — I119 Hypertensive heart disease without heart failure: Secondary | ICD-10-CM

## 2015-10-12 DIAGNOSIS — I471 Supraventricular tachycardia: Secondary | ICD-10-CM

## 2015-10-12 DIAGNOSIS — E876 Hypokalemia: Secondary | ICD-10-CM

## 2015-10-12 LAB — BASIC METABOLIC PANEL
BUN: 18 mg/dL (ref 7–25)
CO2: 28 mmol/L (ref 20–31)
Calcium: 10 mg/dL (ref 8.6–10.4)
Chloride: 105 mmol/L (ref 98–110)
Creat: 0.88 mg/dL (ref 0.50–0.99)
Glucose, Bld: 67 mg/dL (ref 65–99)
Potassium: 3.9 mmol/L (ref 3.5–5.3)
Sodium: 141 mmol/L (ref 135–146)

## 2015-10-13 ENCOUNTER — Telehealth: Payer: Self-pay | Admitting: Cardiology

## 2015-10-13 NOTE — Telephone Encounter (Signed)
Pt has been made aware of her lab level and verbalized understanding.

## 2015-10-13 NOTE — Telephone Encounter (Signed)
-----   Message from Consuelo Pandy, [place] sent at 10/13/2015  3:20 PM EDT ----- Potassium level is normal at 3.9. Ok to stay off supplemental K-dur.

## 2015-10-13 NOTE — Telephone Encounter (Signed)
New message  ° ° ° °Pt is returning call for lab results  °

## 2015-10-16 LAB — HPV TYPE 16 AND 18/45 RNA
HPV Type 16 RNA: DETECTED — AB
HPV Type 18/45 RNA: NOT DETECTED

## 2015-10-19 ENCOUNTER — Other Ambulatory Visit: Payer: Self-pay | Admitting: Gastroenterology

## 2015-10-19 DIAGNOSIS — R932 Abnormal findings on diagnostic imaging of liver and biliary tract: Secondary | ICD-10-CM

## 2015-10-30 ENCOUNTER — Ambulatory Visit
Admission: RE | Admit: 2015-10-30 | Discharge: 2015-10-30 | Disposition: A | Discharge status: Home / Self Care | Payer: BLUE CROSS/BLUE SHIELD | Source: Ambulatory Visit | Attending: Gastroenterology | Admitting: Gastroenterology

## 2015-10-30 DIAGNOSIS — R932 Abnormal findings on diagnostic imaging of liver and biliary tract: Secondary | ICD-10-CM

## 2015-10-30 MED ORDER — GADOBENATE DIMEGLUMINE 529 MG/ML IV SOLN
14.0000 mL | Freq: Once | INTRAVENOUS | Status: AC | PRN
  Administered 2015-10-30: 14 mL via INTRAVENOUS

## 2015-10-31 ENCOUNTER — Ambulatory Visit (INDEPENDENT_AMBULATORY_CARE_PROVIDER_SITE_OTHER): Payer: BLUE CROSS/BLUE SHIELD | Admitting: Gynecology

## 2015-10-31 ENCOUNTER — Encounter: Payer: Self-pay | Admitting: Gynecology

## 2015-10-31 VITALS — BP 122/84

## 2015-10-31 DIAGNOSIS — R8789 Other abnormal findings in specimens from female genital organs: Secondary | ICD-10-CM | POA: Diagnosis not present

## 2015-10-31 DIAGNOSIS — B977 Papillomavirus as the cause of diseases classified elsewhere: Secondary | ICD-10-CM | POA: Diagnosis not present

## 2015-10-31 DIAGNOSIS — R87618 Other abnormal cytological findings on specimens from cervix uteri: Secondary | ICD-10-CM

## 2015-10-31 NOTE — Progress Notes (Signed)
   Patient is a 65 year old presented to the office today for colposcopic evaluation as a result of her abnormal Pap history as follows:  Patient last colposcopy was 12/13/2014 as a result of the past abnormal Pap smear history: Pap smear 2012 normal Pap smear 2016 (February) positive HPV 16 Colposcopy March 2016 left vaginal fornix suspicious area pathology report demonstrated lichen sclerosus/benign ECC  Patient was started on vaginal estrogen cream twice a week and she returned in for follow-up Pap smear: Pap smear July 2016 ASCUS positive HPV 16  August 20 13,016 patient underwent detail colposcopic evaluation of the external genitalia, perineum and perirectal region were no lesions seen. The speculum was then introduced into the vagina. The vaginal epithelium appeared much healthier than earlier this year. Patient has been applying estrogen vaginal cream twice a week. Systematic inspection of the vagina, fornix and cervix did not demonstrate any lesions. Transformation so was not visualized.  Patient was asked to return for follow-up Pap smear in 6 months but did not do so until June 27 of this year whereby the Pap smear demonstrated the following: Pap smear was negative high risk HPV 16 was identified   Patient underwent a detail colposcopic evaluation today of the external genitalia, perineum and perirectal region. No lesions were seen. The speculum was introduced into the vagina. Acetic as was applied in a systematic inspection entire vagina, fornix, cervix was undertaken. A questionable flat acetowhite area at the 6:00 position was noted. Endocervical speculum was utilized to visualize the transformation zone. A biopsy was obtained from the 6:00 ectocervix position as well as an ECC. There appeared to be a left vaginal fornix leukoplakic area which was biopsied it could've been irritation from the speculum.  Physical Exam  Genitourinary:     The above areas were biopsied along with a  vigorous ECC.  I've explained to the patient the biopsy comes back negative then we'll need to do a LEEP cervical conization. Because of her allergy to lidocaine with epinephrine and high sensitivity to intravenous sedation we will have to do it in the operating room under local anesthesia with plain epinephrine for close surveillance since she has had history of allergic reaction and past history of SVT.

## 2015-10-31 NOTE — Patient Instructions (Signed)
Colposcopy  Colposcopy is a procedure to examine your cervix and vagina, or the area around the outside of your vagina, for abnormalities or signs of disease. The procedure is done using a lighted microscope called a colposcope. Tissue samples may be collected during the colposcopy if your health care provider finds any unusual cells. A colposcopy may be done if a woman has:  · An abnormal Pap test. A Pap test is a medical test done to evaluate cells that are on the surface of the cervix.  · A Pap test result that is suggestive of human papillomavirus (HPV). This virus can cause genital warts and is linked to the development of cervical cancer.  · A sore on her cervix and the results of a Pap test were normal.  · Genital warts on the cervix or in or around the outside of the vagina.  · A mother who took the drug diethylstilbestrol (DES) while pregnant.  · Painful intercourse.  · Vaginal bleeding, especially after sexual intercourse.  LET YOUR HEALTH CARE PROVIDER KNOW ABOUT:  · Any allergies you have.  · All medicines you are taking, including vitamins, herbs, eye drops, creams, and over-the-counter medicines.  · Previous problems you or members of your family have had with the use of anesthetics.  · Any blood disorders you have.  · Previous surgeries you have had.  · Medical conditions you have.  RISKS AND COMPLICATIONS  Generally, a colposcopy is a safe procedure. However, as with any procedure, complications can occur. Possible complications include:  · Bleeding.  · Infection.  · Missed lesions.  BEFORE THE PROCEDURE   · Tell your health care provider if you have your menstrual period. A colposcopy typically is not done during menstruation.  · For 24 hours before the colposcopy, do not:    [place].    Use tampons.    Use medicines, creams, or suppositories in the vagina.    Have sexual intercourse.  PROCEDURE   During the procedure, you will be lying on your back with your feet in foot rests (stirrups). A warm  metal or plastic instrument (speculum) will be placed in your vagina to keep it open and to allow the health care provider to see the cervix. The colposcope will be placed outside the vagina. It will be used to magnify and examine the cervix, vagina, and the area around the outside of the vagina. A small amount of liquid solution will be placed on the area that is to be viewed. This solution will make it easier to see the abnormal cells. Your health care provider will use tools to suck out mucus and cells from the canal of the cervix. Then he or she will record the location of the abnormal areas.  If a biopsy is done during the procedure, a medicine will usually be given to numb the area (local anesthetic). You may feel mild pain or cramping while the biopsy is done. After the procedure, tissue samples collected during the biopsy will be sent to a lab for analysis.  AFTER THE PROCEDURE   You will be given instructions on when to follow up with your health care provider for your test results. It is important to keep your appointment.     This information is not intended to replace advice given to you by your health care provider. Make sure you discuss any questions you have with your health care provider.     Document Released: 06/29/2002 Document Revised: 12/09/2012 Document Reviewed: 11/05/2012    Elsevier Interactive Patient Education ©2016 [employer]

## 2015-11-03 LAB — PATHOLOGY

## 2015-11-07 ENCOUNTER — Other Ambulatory Visit: Payer: Self-pay | Admitting: Sports Medicine

## 2015-11-07 ENCOUNTER — Telehealth: Payer: Self-pay

## 2015-11-07 DIAGNOSIS — M542 Cervicalgia: Secondary | ICD-10-CM

## 2015-11-07 NOTE — Telephone Encounter (Signed)
Left message on voicemail for patient to call me  

## 2015-11-08 ENCOUNTER — Telehealth: Payer: Self-pay

## 2015-11-08 ENCOUNTER — Telehealth: Payer: Self-pay | Admitting: Cardiology

## 2015-11-08 NOTE — Telephone Encounter (Signed)
I spoke with patient about scheduling LEEP. She said she has history of problems with anesthesia. I told her Dr. Moshe Salisbury ordered it "local anesthesia only without epinephrine and without IV sedation".  She is still concerned that if local anesthesia is Zylocaine that is could cause her to have tachycardia. She said she would need cardiac clearance. I asked her if she had cardiologist. She said she is patient of Golden Hurter, MD at [facility] but has not seen her yet after her cardiologist left the group. I suggested she call and go in to see her and get her opinion regarding what type anesthesia she recommends.  Patient agreed that this would make her feel better about scheduling surgery if she could do this first.  I checked after our phone call and patient has scheduled appointment for 11/10/15 for medical clearance with her cardiologist. I will wait to hear from her.

## 2015-11-08 NOTE — Telephone Encounter (Signed)
New message    Request for surgical clearance:  1. What type of surgery is being performed? Leep Gyn procedure  2. When is this surgery scheduled? Waiting for clearance to schedule it.  Are there any medications that need to be held prior to surgery and how long? No, issue is how she reacts to anethesia  3. Name of physician performing surgery? Dr. Jerilee Hoh  4. What is your office phone and fax number? [phone]

## 2015-11-10 ENCOUNTER — Ambulatory Visit (INDEPENDENT_AMBULATORY_CARE_PROVIDER_SITE_OTHER): Payer: BLUE CROSS/BLUE SHIELD | Admitting: Cardiology

## 2015-11-10 ENCOUNTER — Encounter: Payer: Self-pay | Admitting: Cardiology

## 2015-11-10 VITALS — BP 129/80 | HR 74 | Ht 63.0 in | Wt 164.1 lb

## 2015-11-10 DIAGNOSIS — Z01810 Encounter for preprocedural cardiovascular examination: Secondary | ICD-10-CM | POA: Diagnosis not present

## 2015-11-10 DIAGNOSIS — E876 Hypokalemia: Secondary | ICD-10-CM | POA: Diagnosis not present

## 2015-11-10 DIAGNOSIS — I119 Hypertensive heart disease without heart failure: Secondary | ICD-10-CM

## 2015-11-10 DIAGNOSIS — I471 Supraventricular tachycardia: Secondary | ICD-10-CM

## 2015-11-10 NOTE — Patient Instructions (Addendum)
Medication Instructions:  Your physician has recommended you make the following change in your medication:  1)  Starting the morning of your procedure - take Toprol 25 mg twice daily for 4 days. Then go back to taking 25 mg once daily  --- If you need a refill on your cardiac medications before your next appointment, please call your pharmacy. ---  Labwork: None ordered  Testing/Procedures: None ordered  Follow-Up: You have been referred to Dr. Curt Bears for your SVT.  Your physician wants you to follow-up in: 6 months with Dr. [name]. You will receive a reminder letter in the mail two months in advance. If you don't receive a letter, please call our office to schedule the follow-up appointment.  Thank you for choosing [facility]!!

## 2015-11-10 NOTE — Progress Notes (Signed)
Cardiology Office Note    Date:  11/10/2015   ID:  Jessica, Potts May 15, 1950, MRN [id number]  PCP:  Cari Caraway, MD  Cardiologist:  Fransico Him, MD   Chief Complaint  Patient presents with  . Follow-up    SVT    History of Present Illness:  [name] is a 65 y.o. female  Who presents to clinic for routine evaluation for PSVT.   She has a past history of essential hypertension, HLD and a history of palpitations and tachycardia. She has had a long history of paroxysmal supraventricular tachycardia. She states that she had an ablation for SVT at [facility] in 2001. Since then she has had infrequent episodes of SVT. Many of her episodes have occurred following surgical procedures. She has had an echocardiogram on 02/02/13 which showed normal left ventricular function with ejection fraction 55-60% and no significant abnormalities. She also has a history of Hashimoto's thyroiditis which is followed by her PCP. She has had issues with hypokalemia in the past and is on daily potassium supplementation.   She is doing well today. She is here today for cardiac clearance for a LEEP procedure by her gynecologist. She apparently has delayed SVT after she gets anesthesia. This occurs with both conscious sedation and with general anesthesia.  This can occur from a few hours to 3 days after the general anesthesia or conscious sedation as happened with her colonoscopy. The LEEP procedure will be as an outpt in the outpt surgical facility.  She denies chest pain, dyspnea, syncope/near-syncope. She has not had any breakthrough of palpitations recently.      Past Medical History  Diagnosis Date  . Hypothyroidism   . Hyperlipidemia   . SVT (supraventricular tachycardia) ([place])   . GERD (gastroesophageal reflux disease)     Past Surgical History  Procedure Laterality Date  . Cesarean section  1987  . Breast surgery  1976    CYST REMOVED  . Hysteroscopy  9.14.2012      w/myomectomy  . Left thumb surgery and carpal tunnel release  9/14    Current Medications: Outpatient Prescriptions Prior to Visit  Medication Sig Dispense Refill  . cetirizine (ZYRTEC) 10 MG tablet Take 10 mg by mouth daily as needed for allergies.     [name] esomeprazole (NEXIUM) 40 MG capsule Take 40 mg by mouth daily at 12 noon.    . fish oil-omega-3 fatty acids 1000 MG capsule Take 1 g by mouth daily.    [name] levothyroxine (SYNTHROID, LEVOTHROID) 50 MCG tablet Take 50 mcg by mouth daily before breakfast.     . methocarbamol (ROBAXIN) 500 MG tablet Take 1 tablet (500 mg total) by mouth 2 (two) times daily. (Patient taking differently: Take 500 mg by mouth 2 (two) times daily as needed for muscle spasms. ) 20 tablet 0  . metoprolol succinate (TOPROL-XL) 25 MG 24 hr tablet TAKE 1 TABLET (25 MG TOTAL) BY MOUTH EVERY EVENING. 90 tablet 3  . Multiple Vitamin (MULTIVITAMIN WITH MINERALS) TABS tablet Take 1 tablet by mouth daily.    . NONFORMULARY OR COMPOUNDED ITEM Estradiol 0.02 % 75ml prefilled applicator Sig: apply twice a week (Patient taking differently: Apply 1 application topically as directed. Estradiol 0.02 % 68ml prefilled applicator Sig: apply twice a week as needed for low estrogen) 24 each 4  . polyethylene glycol (MIRALAX) packet Take 17 g by mouth daily. (Patient taking differently: Take 17 g by mouth daily as needed for moderate constipation. ) 14 each  0  . Probiotic Product (PROBIOTIC ADVANCED PO) Take 1 capsule by mouth daily.     . rosuvastatin (CRESTOR) 10 MG tablet Take 10 mg by mouth 3 (three) times a week. Every Monday, Wednesday, and Friday in the evenings.    . VERAMYST 27.5 MCG/SPRAY nasal spray Place 1 spray into the nose daily as needed for allergies.     . Vitamin D, Ergocalciferol, (DRISDOL) 50000 UNITS CAPS capsule Take 50,000 Units by mouth every 7 (seven) days.    . vitamin E 400 UNIT capsule Take 1 capsule by mouth daily.     No facility-administered medications  prior to visit.     Allergies:   Lipitor; Epinephrine; and Lidocaine-epinephrine   Social History   Social History  . Marital Status: Widowed    Spouse Name: N/A  . Number of Children: N/A  . Years of Education: N/A   Social History Main Topics  . Smoking status: Never Smoker   . Smokeless tobacco: Never Used  . Alcohol Use: Yes     Comment: rare  . Drug Use: No  . Sexual Activity: No     Comment: 65 YEARS OLD, NO MORE THAN 5 PARTNERS    Other Topics Concern  . None   Social History Narrative     Family History:  The patient's family history includes Arthritis in her brother, father, mother, and sister; Colon polyps in her father; Glaucoma in her brother, father, and mother; Heart disease in her father and mother; Hypertension in her brother, father, and mother; Mitral valve prolapse in her brother and mother.   ROS:   Please see the history of present illness.    ROS All other systems reviewed and are negative.   PHYSICAL EXAM:   VS:  BP 129/80 mmHg  Pulse 74  Ht 5\' 3"  (1.6 m)  Wt 164 lb 1.9 oz (74.444 kg)  BMI 29.08 kg/m2  LMP 11/26/2000   GEN: Well nourished, well developed, in no acute distress HEENT: normal Neck: no JVD, carotid bruits, or masses Cardiac: RRR; no murmurs, rubs, or gallops,no edema.  Intact distal pulses bilaterally.  Respiratory:  clear to auscultation bilaterally, normal work of breathing GI: soft, nontender, nondistended, + BS MS: no deformity or atrophy Skin: warm and dry, no rash Neuro:  Alert and Oriented x 3, Strength and sensation are intact Psych: euthymic mood, full affect  Wt Readings from Last 3 Encounters:  11/10/15 164 lb 1.9 oz (74.444 kg)  10/03/15 160 lb (72.576 kg)  09/27/15 160 lb (72.576 kg)      Studies/Labs Reviewed:   EKG:  EKG is not ordered today.    Recent Labs: 09/27/2015: ALT 29; Hemoglobin 13.1; Platelets 140* 10/12/2015: BUN 18; Creat 0.88; Potassium 3.9; Sodium 141   Lipid Panel    Component Value  Date/Time   CHOL 205* 12/27/2014 1055   TRIG 129 12/27/2014 1055   HDL 60 12/27/2014 1055   CHOLHDL 3.4 12/27/2014 1055   VLDL 26 12/27/2014 1055   LDLCALC 119 12/27/2014 1055    Additional studies/ records that were reviewed today include:  none    ASSESSMENT:    1. SVT (supraventricular tachycardia) ([place])   2. Benign hypertensive heart disease without heart failure   3. Hypokalemia   4. Preoperative cardiovascular examination      PLAN:  In order of problems listed above:  1. SVT with remote ablation in 2001 - she has not had any further palpitations in some time.  She is  tolerating her Toprol without any problems.  2. HTN - BP controlled on Toprol and she will continue at current dose. 3. Hypokalemia - resolved off potassium suppl 4. Preop clearance for LEEP under general anesthesia.  She has a history of recurrent SVT requiring ER visits.  This can occur anywhere from an hour after surgery up to 3 days post op.  This occurs with general and conscious sedation.  I have recommended that we increase her Toprol to 25mg  BID starting the morning of her procedure and continue this for 4 days after her procedure and if no problems then go back to once daily.  I have also recommended that we refer her to Dr. Curt Bears for consideration of repeat EPS with possible RFA so she does not have any further problems when having surgeries.    Medication Adjustments/Labs and Tests Ordered: Current medicines are reviewed at length with the patient today.  Concerns regarding medicines are outlined above.  Medication changes, Labs and Tests ordered today are listed in the Patient Instructions below.  Patient Instructions  Medication Instructions:  Your physician has recommended you make the following change in your medication:  1)  Starting the morning of your procedure - take Toprol 25 mg twice daily for 4 days. Then go back to taking 25 mg once daily  --- If you need a refill on your cardiac  medications before your next appointment, please call your pharmacy. ---  Labwork: None ordered  Testing/Procedures: None ordered  Follow-Up: You have been referred to Dr. Curt Bears for your SVT.  Your physician wants you to follow-up in: 6 months with Dr. [name]. You will receive a reminder letter in the mail two months in advance. If you don't receive a letter, please call our office to schedule the follow-up appointment.  Thank you for choosing [facility]!!               Signed, Fransico Him, MD  11/10/2015 10:46 AM    [address], [place], [place]  [address] Phone: [phone]; Fax: [phone]

## 2015-11-10 NOTE — Telephone Encounter (Signed)
Clearance faxed

## 2015-11-15 ENCOUNTER — Ambulatory Visit
Admission: RE | Admit: 2015-11-15 | Discharge: 2015-11-15 | Disposition: A | Discharge status: Home / Self Care | Payer: BLUE CROSS/BLUE SHIELD | Source: Ambulatory Visit | Attending: Sports Medicine | Admitting: Sports Medicine

## 2015-11-15 DIAGNOSIS — M542 Cervicalgia: Secondary | ICD-10-CM

## 2015-12-03 NOTE — Progress Notes (Signed)
Electrophysiology Office Note   Date:  12/04/2015   ID:  [name], [name] 01-Nov-1950, MRN [id number]  PCP:  Cari Caraway, MD  Cardiologist:  [name] Primary Electrophysiologist:  Camie Hauss Meredith Leeds, MD    Chief Complaint  Patient presents with  . Advice Only    SVT     History of Present Illness: [name] is a 65 y.o. female who presents today for electrophysiology evaluation.   Hx essential hypertension, HLD and a history of palpitations and tachycardia.  She has had a long history of paroxysmal supraventricular tachycardia. She states that she had an ablation for SVT at [facility] in 2001. Since then she has had infrequent episodes of SVT. Many of her episodes have occurred following surgical procedures. She has had an echocardiogram on 02/02/13 which showed normal left ventricular function with ejection fraction 55-60% and no significant abnormalities. She also has a history of Hashimoto's thyroiditis which is followed by her PCP.   She apparently has delayed SVT after she gets anesthesia. This occurs with both conscious sedation and with general anesthesia.  This can occur from a few hours to 3 days after the general anesthesia or conscious sedation as happened with her colonoscopy.   Today, she denies symptoms of palpitations, chest pain, shortness of breath, orthopnea, PND, lower extremity edema, claudication, dizziness, presyncope, syncope, bleeding, or neurologic sequela. The patient is tolerating medications without difficulties and is otherwise without complaint today.  She says that she has been feeling well without complaints of palpitations. Her most recent episode of palpitations was 2 years ago when she presented to an emergency room around [facility]. She says that this was a short stay and she was in sinus rhythm on presentation. She also says that her palpitations appear to be worse when her thyroid is poorly  controlled.   Past Medical History:  Diagnosis Date  . GERD (gastroesophageal reflux disease)   . Hyperlipidemia   . Hypothyroidism   . SVT (supraventricular tachycardia) ([facility])    Past Surgical History:  Procedure Laterality Date  . BREAST SURGERY  1976   CYST REMOVED  . CESAREAN SECTION  1987  . HYSTEROSCOPY  9.14.2012   w/myomectomy  . left thumb surgery and Carpal Tunnel Release  9/14     Current Outpatient Prescriptions  Medication Sig Dispense Refill  . cetirizine (ZYRTEC) 10 MG tablet Take 10 mg by mouth daily as needed for allergies.     [name] esomeprazole (NEXIUM) 40 MG capsule Take 40 mg by mouth daily at 12 noon.    . fish oil-omega-3 fatty acids 1000 MG capsule Take 1 g by mouth daily.    [name] levothyroxine (SYNTHROID, LEVOTHROID) 50 MCG tablet Take 50 mcg by mouth daily before breakfast.     . methocarbamol (ROBAXIN) 500 MG tablet Take 500 mg by mouth 2 (two) times daily as needed for muscle spasms.    . metoprolol succinate (TOPROL-XL) 25 MG 24 hr tablet TAKE 1 TABLET (25 MG TOTAL) BY MOUTH EVERY EVENING. 90 tablet 3  . Multiple Vitamin (MULTIVITAMIN WITH MINERALS) TABS tablet Take 1 tablet by mouth daily.    . NONFORMULARY OR COMPOUNDED ITEM Estradiol 0.02 % 53ml prefilled applicator Sig: apply twice a week (Patient taking differently: Apply 1 application topically as directed. Estradiol 0.02 % 6ml prefilled applicator Sig: apply twice a week as needed for low estrogen) 24 each 4  . polyethylene glycol (MIRALAX / GLYCOLAX) packet Take 17 g by mouth daily as  needed.    . Probiotic Product (PROBIOTIC ADVANCED PO) Take 1 capsule by mouth daily.     . rosuvastatin (CRESTOR) 10 MG tablet Take 10 mg by mouth 3 (three) times a week. Every Monday, Wednesday, and Friday in the evenings.    . VERAMYST 27.5 MCG/SPRAY nasal spray Place 1 spray into the nose daily as needed for allergies.     . Vitamin D, Ergocalciferol, (DRISDOL) 50000 UNITS CAPS capsule Take 50,000 Units by mouth  every 7 (seven) days.    . vitamin E 400 UNIT capsule Take 1 capsule by mouth daily.     No current facility-administered medications for this visit.     Allergies:   Lipitor [atorvastatin]; Epinephrine; and Lidocaine-epinephrine [lidocaine-epinephrine]   Social History:  The patient  reports that she has never smoked. She has never used smokeless tobacco. She reports that she drinks alcohol. She reports that she does not use drugs.   Family History:  The patient's family history includes Arthritis in her brother, father, mother, and sister; Colon polyps in her father; Glaucoma in her brother, father, and mother; Heart disease in her father and mother; Hypertension in her brother, father, and mother; Mitral valve prolapse in her brother and mother.    ROS:  Please see the history of present illness.   Otherwise, review of systems is positive for back pain, muscle pain.   All other systems are reviewed and negative.    PHYSICAL EXAM: VS:  BP 110/78   Pulse 68   Ht 5\' 3"  (1.6 m)   Wt 166 lb 12.8 oz (75.7 kg)   LMP 11/26/2000   BMI 29.55 kg/m  , BMI Body mass index is 29.55 kg/m. GEN: Well nourished, well developed, in no acute distress  HEENT: normal  Neck: no JVD, carotid bruits, or masses Cardiac: RRR; no murmurs, rubs, or gallops,no edema  Respiratory:  clear to auscultation bilaterally, normal work of breathing GI: soft, nontender, nondistended, + BS MS: no deformity or atrophy  Skin: warm and dry Neuro:  Strength and sensation are intact Psych: euthymic mood, full affect  EKG:  EKG is not ordered today. Personal review of the ekg ordered 09/27/15 shows sinus rhythm, rate 71, RSR' in V1 and V2  Recent Labs: 09/27/2015: ALT 29; Hemoglobin 13.1; Platelets 140 10/12/2015: BUN 18; Creat 0.88; Potassium 3.9; Sodium 141    Lipid Panel     Component Value Date/Time   CHOL 205 (H) 12/27/2014 1055   TRIG 129 12/27/2014 1055   HDL 60 12/27/2014 1055   CHOLHDL 3.4 12/27/2014 1055     VLDL 26 12/27/2014 1055   LDLCALC 119 12/27/2014 1055     Wt Readings from Last 3 Encounters:  12/04/15 166 lb 12.8 oz (75.7 kg)  11/10/15 164 lb 1.9 oz (74.4 kg)  10/03/15 160 lb (72.6 kg)      Other studies Reviewed: Additional studies/ records that were reviewed today include: TTE 2014  Review of the above records today demonstrates:  Left ventricle: The cavity size was normal. Systolic function was normal. The estimated ejection fraction was in the range of 55% to 60%. Wall motion was normal; there were no regional wall motion abnormalities.  Personal review of event monitor 2015 shows sinus rhythm with occasional APCs.  ASSESSMENT AND PLAN:  1.  SVT: s/p ablaiton 2001. I have offered her the options of increasing metoprolol versus repeat EP study. She says that she does not feel like she wants a repeat EP study at  this time as she has been feeling well over the past few years. She has presented to emergency room with palpitations, but there is no documentation of tachycardia. I did tell her that it is okay to increase her metoprolol around the time of her upcoming LEEP procedure. She Valena Ivanov monitor her symptoms and call as necessary. She may be a candidate for repeat EP study and ablation in the future, if there is evidence of tachycardia  2. Hypertension: well controlled on metoprolol    Current medicines are reviewed at length with the patient today.   The patient does not have concerns regarding her medicines.  The following changes were made today:  Increase Toprol XL to 25 mg BID  Labs/ tests ordered today include:  No orders of the defined types were placed in this encounter.    Disposition:   FU with Analiah Drum 6 months  Signed, Eino Whitner Meredith Leeds, MD  12/04/2015 9:17 AM     [facility] [address] [place] [address] [phone] (office) [phone] (fax)

## 2015-12-04 ENCOUNTER — Encounter: Payer: Self-pay | Admitting: Cardiology

## 2015-12-04 ENCOUNTER — Ambulatory Visit (INDEPENDENT_AMBULATORY_CARE_PROVIDER_SITE_OTHER): Payer: BLUE CROSS/BLUE SHIELD | Admitting: Cardiology

## 2015-12-04 VITALS — BP 110/78 | HR 68 | Ht 63.0 in | Wt 166.8 lb

## 2015-12-04 DIAGNOSIS — R002 Palpitations: Secondary | ICD-10-CM

## 2015-12-04 MED ORDER — METOPROLOL SUCCINATE ER 25 MG PO TB24: ORAL_TABLET | ORAL | 3 refills | Status: DC

## 2015-12-04 NOTE — Patient Instructions (Signed)
Your physician has recommended you make the following change in your medication:  1.) INCREASE YOUR METOPROLOL SUCCINATE (TOPROL XL) 25 MG TO 1 TABLET EVERY 12 HOURS.  Your physician wants you to follow-up in: [place] Curt Bears.  You will receive a reminder letter in the mail two months in advance. If you don't receive a letter, please call our office to schedule the follow-up appointment.

## 2015-12-22 ENCOUNTER — Telehealth: Payer: Self-pay

## 2015-12-22 NOTE — Telephone Encounter (Signed)
Local anesthesia (lidocaine without epinephrine) with anesthesia on standby. She will need a IV access In case medications may be needed. She will talk further with the anesthesiologist. I will make an appointment for her to sometime and perhaps a few days before the surgery

## 2015-12-22 NOTE — Telephone Encounter (Signed)
You had sent me surgery order for LEEP to be scheduled outpatient at [facility] and you wrote " "local anesthesia only without epinephrine and without IV sedation."  Patient said you told her it "would be in a controlled situation with light anesthesia".  She wants me to ask will any sedation anesthesia be used?

## 2015-12-26 ENCOUNTER — Telehealth: Payer: Self-pay | Admitting: Cardiology

## 2015-12-26 NOTE — Telephone Encounter (Signed)
Yes because I saw her 2 months ago. Make sure it is one week prior to surgery

## 2015-12-26 NOTE — Telephone Encounter (Signed)
Records received from [facility]- gave to Sherri/Camnitz.

## 2015-12-26 NOTE — Telephone Encounter (Signed)
I spoke with patient and scheduled her for 01/09/16. Will you need pre-op appt with her? (Surgery slip did not indicate)

## 2015-12-27 ENCOUNTER — Encounter (HOSPITAL_COMMUNITY): Payer: Self-pay

## 2015-12-27 NOTE — Telephone Encounter (Signed)
Left message with patient that she needs to schedule pre-op appt with Dr. Moshe Salisbury week prior to surgery and to call me or appt desk to schedule.

## 2015-12-29 NOTE — Patient Instructions (Signed)
Your procedure is scheduled on:  Tuesday, Sept. 19, 2017  Enter through the [employer] [facility] at:  6:00 AM  Pick up the phone at the desk and dial [phone].  Call this number if you have problems the morning of surgery: [phone].  Remember: Do NOT eat food or drink after:  Midnight Monday, Sept. 18, 2017  Take these medicines the morning of surgery with a SIP OF WATER:  Metoprolol, Levothyroxine, Nexium  Stop taking fish oil and Vitamin E at this time  Do NOT wear jewelry (body piercing), metal hair clips/bobby pins, make-up, or nail polish. Do NOT wear lotions, powders, or perfumes.  You may wear deodorant. Do NOT shave for 48 hours prior to surgery. Do NOT bring valuables to the hospital. Contacts, dentures, or bridgework may not be worn into surgery.  Have a responsible adult drive you home and stay with you for 24 hours after your procedure

## 2016-01-01 ENCOUNTER — Encounter (HOSPITAL_COMMUNITY): Payer: Self-pay

## 2016-01-01 ENCOUNTER — Encounter (HOSPITAL_COMMUNITY)
Admission: RE | Admit: 2016-01-01 | Discharge: 2016-01-01 | Disposition: A | Discharge status: Home / Self Care | Payer: BLUE CROSS/BLUE SHIELD | Source: Ambulatory Visit | Attending: Gynecology | Admitting: Gynecology

## 2016-01-01 DIAGNOSIS — Z01812 Encounter for preprocedural laboratory examination: Secondary | ICD-10-CM | POA: Diagnosis not present

## 2016-01-01 HISTORY — DX: Adverse effect of unspecified anesthetic, initial encounter: T41.45XA

## 2016-01-01 HISTORY — DX: Unspecified osteoarthritis, unspecified site: M19.90

## 2016-01-01 HISTORY — DX: Other complications of anesthesia, initial encounter: T88.59XA

## 2016-01-01 LAB — CBC
HCT: 37.4 % (ref 36.0–46.0)
Hemoglobin: 13.5 g/dL (ref 12.0–15.0)
MCH: 28.7 pg (ref 26.0–34.0)
MCHC: 36.1 g/dL — ABNORMAL HIGH (ref 30.0–36.0)
MCV: 79.6 fL (ref 78.0–100.0)
Platelets: 138 10*3/uL — ABNORMAL LOW (ref 150–400)
RBC: 4.7 MIL/uL (ref 3.87–5.11)
RDW: 15.2 % (ref 11.5–15.5)
WBC: 4.5 10*3/uL (ref 4.0–10.5)

## 2016-01-01 LAB — BASIC METABOLIC PANEL
Anion gap: 5 (ref 5–15)
BUN: 19 mg/dL (ref 6–20)
CO2: 26 mmol/L (ref 22–32)
Calcium: 10 mg/dL (ref 8.9–10.3)
Chloride: 108 mmol/L (ref 101–111)
Creatinine, Ser: 0.76 mg/dL (ref 0.44–1.00)
GFR calc Af Amer: >60 mL/min (ref >60)
GFR calc non Af Amer: >60 mL/min (ref >60)
Glucose, Bld: 95 mg/dL (ref 65–99)
Potassium: 4.4 mmol/L (ref 3.5–5.1)
Sodium: 139 mmol/L (ref 135–145)

## 2016-01-02 ENCOUNTER — Encounter: Payer: Self-pay | Admitting: Gynecology

## 2016-01-02 ENCOUNTER — Ambulatory Visit (INDEPENDENT_AMBULATORY_CARE_PROVIDER_SITE_OTHER): Payer: BLUE CROSS/BLUE SHIELD | Admitting: Gynecology

## 2016-01-02 VITALS — BP 130/80

## 2016-01-02 DIAGNOSIS — Z23 Encounter for immunization: Secondary | ICD-10-CM

## 2016-01-02 DIAGNOSIS — N870 Mild cervical dysplasia: Secondary | ICD-10-CM

## 2016-01-02 NOTE — Patient Instructions (Signed)
Loop Electrosurgical Excision Procedure, Care After Refer to this sheet in the next few weeks. These instructions provide you with information on caring for yourself after your procedure. Your caregiver may also give you more specific instructions. Your treatment has been planned according to current medical practices, but problems sometimes occur. Call your caregiver if you have any problems or questions after your procedure. HOME CARE INSTRUCTIONS   Do not use tampons, douche, or have sexual intercourse for 2 weeks or as directed by your caregiver.  Begin normal activities if you have no or minimal cramping or bleeding, unless directed otherwise by your caregiver.  Take your temperature if you feel sick. Write down your temperature on paper, and tell your caregiver if you have a fever.  Take all medicines as directed by your caregiver.  Keep all your follow-up appointments and Pap tests as directed by your caregiver. SEEK IMMEDIATE MEDICAL CARE IF:   You have bleeding that is heavier or longer than a normal menstrual cycle.  You have bleeding that is bright red.  You have blood clots.  You have a fever.  You have increasing cramps or pain not relieved by medicine.  You develop abdominal pain that does not seem to be related to the same area of earlier cramping and pain.  You are lightheaded, unusually weak, or faint.  You develop painful or bloody urination.  You develop a bad smelling vaginal discharge. MAKE SURE YOU:  Understand these instructions.  Will watch your condition.  Will get help right away if you are not doing well or get worse.   This information is not intended to replace advice given to you by your health care provider. Make sure you discuss any questions you have with your health care provider.   Document Released: 12/20/2010 Document Revised: 04/29/2014 Document Reviewed: 12/20/2010 Elsevier Interactive Patient Education [employer].

## 2016-01-02 NOTE — Addendum Note (Signed)
Addended by: Thurnell Garbe A on: 01/02/2016 12:19 PM   Modules accepted: Orders

## 2016-01-02 NOTE — Progress Notes (Signed)
Patient is a 65 year old that presented to the office today for LEEP cervical conization. Her history is as follows:  She was last seen the office in July 11 for colposcopic evaluation:  Patient last colposcopy was 12/13/2014 as a result of the past abnormal Pap smear history: Pap smear 2012 normal Pap smear 2016 (February) positive HPV 16 Colposcopy March 2016 left vaginal fornix suspicious area pathology report demonstrated lichen sclerosus/benign ECC  Patient was started on vaginal estrogen cream twice a week and she returned in for follow-up Pap smear: Pap smear July 2016 ASCUS positive HPV 16  August 20 13,016 patient underwent detail colposcopic evaluation of the external genitalia, perineum and perirectal region were no lesions seen. The speculum was then introduced into the vagina. The vaginal epithelium appeared much healthier than earlier this year. Patient has been applying estrogen vaginal cream twice a week. Systematic inspection of the vagina, fornix and cervix did not demonstrate any lesions. Transformation so was not visualized.  Patient was asked to return for follow-up Pap smear in 6 months but did not do so until June 27 of this year whereby the Pap smear demonstrated the following: Pap smear was negative high risk HPV 16 was identified   Her colposcopic directed biopsy on that day demonstrated the following: Report   Comments: FINAL DIAGNOSIS:  A. Vagina-Biopsy, Left fornix:     Squamous mucosa with focal stromal adenosis involved by squamous metaplasia and       focal koilocytotic atypia compatible with HPV effect.     Negative for definitive dysplasia.    See comment.    B. Endocervix - Curettage:     Superficial and disrupted fragments of benign endocervical mucosa in a background of  mucus   and inflammatory debris.     C. Cervix- Biopsy, 6 o'clock:      Focal low grade dysplasia (CIN I).    Acute and chronic cervicitis      See comment.   COMMENT:  Regarding specimen A, original and recut sections were evaluated.    Regarding specimen C, immunohistochemical stains for p16 and Ki-67 were performed;  controls stained appropriately. The IHC stains were performed to exclude involvement by  high grade dysplasia. The pattern of expression does not support high grade dysplasia.   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  A coated speculum was inserted into the vagina and colposcopic examination was performed with 4% acidic acid with findings noted above.  The cervix was then painted with Lugol's solution to delineate the margins of the lesion and transformation zone.  Approximately 5 cc's of 1% xylocaine with epinephrine was infiltrated deep near the outer margin of the transformation zone circumferentially at 12, 3, 6, and 9 o'clock positions.  [facility] Electrosurgical Generator was then turned on after the patient was grounded with pad electrode on her thigh and jewelry removed.  The settings on the generator were Blend 1 current 70 watts cut and 70 watts on the coagulation mode.  A size 20 x 12  loop electrode was utilized to exercise the atypical transformation zone.  The tip of the electrode was placed 3 mm from the edge of the lesion at 3, 6, 9 and 12 o'clock position.  The electrode was moved slowly over the lesion was within the loop limits.  A vaginal wall retractor was not  used.  The loop was then repositioned and the finger switch on the hand held piece was activated ( or footpedal depressed).  A slight pressure on the shaft was applied and the loop was extended into the tissue up to its crossbar to a depth of 6 mm, then with steady, slow motion across and underneath the endocervical button was not excised.  The loop electrode was replaced with ball electrode set at 50 watts and the base of the crater was fulgurated circumferentially.  Monsell's paint was then applied for additional hemostasis.   Patient tolerated the procedure well with minimal blood loss and without any complications.  After the procedure patient left office with stable vital signs and instructions sheet.  Patient was provided with literature information was to expect post-LEEP procedure and she'll return back in 3 weeks for final postop visit. Patient requesting flu vaccine today.   [facility] HMD12:01 PMTD@

## 2016-01-02 NOTE — Addendum Note (Signed)
Addended by: Thurnell Garbe A on: 01/02/2016 12:58 PM   Modules accepted: Orders

## 2016-01-04 LAB — PATHOLOGY

## 2016-01-05 ENCOUNTER — Encounter: Payer: Self-pay | Admitting: Gynecology

## 2016-01-08 ENCOUNTER — Telehealth: Payer: Self-pay | Admitting: *Deleted

## 2016-01-08 NOTE — Telephone Encounter (Signed)
Pt informed with the below note. 

## 2016-01-08 NOTE — Telephone Encounter (Signed)
Pt had LEEP on 12/02/15 had yellowish discharge which has stopped now having pinkish discharge asked if that is normal?   Pt also asked if you wanted her to continue taking estrogen vaginal cream twice weekly? Please advise

## 2016-01-08 NOTE — Telephone Encounter (Signed)
Reassure  this may happen time to time since her LEEP. Have her return back to the vaginal estrogen twice a week

## 2016-01-09 ENCOUNTER — Encounter (HOSPITAL_COMMUNITY): Admission: RE | Payer: Self-pay | Source: Ambulatory Visit

## 2016-01-09 ENCOUNTER — Ambulatory Visit (HOSPITAL_COMMUNITY): Admission: RE | Admit: 2016-01-09 | Payer: BLUE CROSS/BLUE SHIELD | Source: Ambulatory Visit | Admitting: Gynecology

## 2016-01-09 SURGERY — LEEP (LOOP ELECTROSURGICAL EXCISION PROCEDURE)
Anesthesia: LOCAL

## 2016-01-24 ENCOUNTER — Ambulatory Visit (INDEPENDENT_AMBULATORY_CARE_PROVIDER_SITE_OTHER): Payer: BLUE CROSS/BLUE SHIELD | Admitting: Gynecology

## 2016-01-24 ENCOUNTER — Encounter: Payer: Self-pay | Admitting: Gynecology

## 2016-01-24 VITALS — BP 136/82

## 2016-01-24 DIAGNOSIS — Z09 Encounter for follow-up examination after completed treatment for conditions other than malignant neoplasm: Secondary | ICD-10-CM

## 2016-01-24 DIAGNOSIS — N870 Mild cervical dysplasia: Secondary | ICD-10-CM

## 2016-01-24 MED ORDER — CLINDAMYCIN PHOSPHATE 2 % VA CREA: 1.0000 | TOPICAL_CREAM | Freq: Every day | VAGINAL | 1 refills | Status: DC

## 2016-01-24 NOTE — Progress Notes (Signed)
   Patient is a 65 year old that presented to the office today for a postop visit. 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 Her history is as follows:  "She was last seen the office in July 11 for colposcopic evaluation:  Patient last colposcopy was 12/13/2014 as a result of the past abnormal Pap smear history: Pap smear 2012 normal Pap smear 2016 (February) positive HPV 16 Colposcopy March 2016 left vaginal fornix suspicious area pathology report demonstrated lichen sclerosus/benign ECC  Patient was started on vaginal estrogen cream twice a week and she returned in for follow-up Pap smear: Pap smear July 2016 ASCUS positive HPV 16  August 20 13,016 patient underwent detail colposcopic evaluation of the external genitalia, perineum and perirectal region were no lesions seen. The speculum was then introduced into the vagina. The vaginal epithelium appeared much healthier than earlier this year. Patient has been applying estrogen vaginal cream twice a week. Systematic inspection of the vagina, fornix and cervix did not demonstrate any lesions. Transformation so was not visualized.  Patient was asked to return for follow-up Pap smear in 6 months but did not do so until June 27 of this year whereby the Pap smear demonstrated the following: Pap smear was negative high risk HPV 16 was identified   Her colposcopic directed biopsy on that day demonstrated the following: Report   Comments: FINAL DIAGNOSIS:  A. Vagina-Biopsy, Left fornix:     Squamous mucosa with focal stromal adenosis involved by squamous metaplasia and       focal koilocytotic atypia compatible with HPV effect.     Negative for definitive dysplasia.    See comment.    B. Endocervix - Curettage:     Superficial and disrupted fragments of benign endocervical mucosa in a background of  mucus   and inflammatory debris.     C. Cervix- Biopsy, 6 o'clock:       Focal low grade dysplasia (CIN I).    Acute and chronic cervicitis     See comment.   COMMENT:  Regarding specimen A, original and recut sections were evaluated.    Regarding specimen C, immunohistochemical stains for p16 and Ki-67 were performed;  controls stained appropriately. The IHC stains were performed to exclude involvement by  high grade dysplasia. The pattern of expression does not support high grade dysplasia.   The above findings are associated with the patient's previous pap test    Her pathology report demonstrated the following: FINAL DIAGNOSIS:  A. Cervix- LEEP, conization:     Low grade squamous intraepithelial lesion (LSIL), mild dysplasia and HPV infection,  CIN I.     Margins appear negative for dysplasia.    See comment.   COMMENT:  This LEEP corresponds with prior biopsy, [id number], and pap, [id number], [facility]   Patient was complaining of occasional spotting but otherwise done well.  Exam: Bartholin urethra Skene was within normal limits Vagina: No lesions or discharge Cervix: Cervical bed 50% healed slightly friable on contact Bimanual exam not done Rectal exam: Not done  Assessment/plan: Patient status post LEEP cervical conization for persistent CIN-1 and positive HPV. Pathology report confirmed CIN-1 margins negative. Patient will be prescribed Cleocin vaginal cream to apply daily at bedtime for 7-10 days. Patient will return to the office in 4 weeks for final postop visit.

## 2016-02-22 ENCOUNTER — Ambulatory Visit (INDEPENDENT_AMBULATORY_CARE_PROVIDER_SITE_OTHER): Payer: BLUE CROSS/BLUE SHIELD | Admitting: Gynecology

## 2016-02-22 ENCOUNTER — Encounter: Payer: Self-pay | Admitting: Gynecology

## 2016-02-22 VITALS — BP 128/76

## 2016-02-22 DIAGNOSIS — Z09 Encounter for follow-up examination after completed treatment for conditions other than malignant neoplasm: Secondary | ICD-10-CM

## 2016-02-22 NOTE — Progress Notes (Signed)
   Patient is a 65 year old that presented to the office for her six-week postop visit. 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 She is asymptomatic. Her history as follows:  "She was last seen the office in July 11 for colposcopic evaluation:  Patient last colposcopy was 12/13/2014 as a result of the past abnormal Pap smear history: Pap smear 2012 normal Pap smear 2016 (February) positive HPV 16 Colposcopy March 2016 left vaginal fornix suspicious area pathology report demonstrated lichen sclerosus/benign ECC  Patient was started on vaginal estrogen cream twice a week and she returned in for follow-up Pap smear: Pap smear July 2016 ASCUS positive HPV 16  August 20 13,016 patient underwent detail colposcopic evaluation of the external genitalia, perineum and perirectal region were no lesions seen. The speculum was then introduced into the vagina. The vaginal epithelium appeared much healthier than earlier this year. Patient has been applying estrogen vaginal cream twice a week. Systematic inspection of the vagina, fornix and cervix did not demonstrate any lesions. Transformation so was not visualized.  Patient was asked to return for follow-up Pap smear in 6 months but did not do so until June 27 of this year whereby the Pap smear demonstrated the following: Pap smear was negative high risk HPV 16 was identified   Her colposcopic directed biopsy on that day demonstrated the following: Report   Comments: FINAL DIAGNOSIS:  A. Vagina-Biopsy, Left fornix:     Squamous mucosa with focal stromal adenosis involved by squamous metaplasia and       focal koilocytotic atypia compatible with HPV effect.     Negative for definitive dysplasia.    See comment.    B. Endocervix - Curettage:     Superficial and disrupted fragments of benign endocervical mucosa in a background of  mucus   and inflammatory debris.     C. Cervix-  Biopsy, 6 o'clock:      Focal low grade dysplasia (CIN I).    Acute and chronic cervicitis     See comment.   COMMENT:  Regarding specimen A, original and recut sections were evaluated.    Regarding specimen C, immunohistochemical stains for p16 and Ki-67 were performed;  controls stained appropriately. The IHC stains were performed to exclude involvement by  high grade dysplasia. The pattern of expression does not support high grade dysplasia.   The above findings are associated with the patient's previous pap test    Her pathology report demonstrated the following: FINAL DIAGNOSIS:  A. Cervix- LEEP, conization:     Low grade squamous intraepithelial lesion (LSIL), mild dysplasia and HPV infection,  CIN I.     Margins appear negative for dysplasia.    See comment.   COMMENT:  This LEEP corresponds with prior biopsy, [id number], and pap, [id number], [facility]    Pelvic exam: Bartholin urethra Skene was within normal limits Vagina: No lesions or discharge Cervix: Cervical bed completely healed Bimanual exam not done Rectal exam not done  Assessment/plan:Patient status post LEEP cervical conization for persistent CIN-1 and positive HPV. Pathology report confirmed CIN-1 margins negative. Patient to continue vaginal estrogen was a twice a week for vaginal atrophy otherwise repeat Pap smear in one year

## 2016-06-06 ENCOUNTER — Ambulatory Visit: Payer: BLUE CROSS/BLUE SHIELD | Admitting: Cardiology

## 2016-06-07 ENCOUNTER — Encounter: Payer: Self-pay | Admitting: Cardiology

## 2016-06-11 ENCOUNTER — Ambulatory Visit: Payer: BLUE CROSS/BLUE SHIELD | Admitting: Cardiology

## 2016-06-17 ENCOUNTER — Encounter (INDEPENDENT_AMBULATORY_CARE_PROVIDER_SITE_OTHER): Payer: Self-pay

## 2016-06-17 ENCOUNTER — Ambulatory Visit: Payer: BLUE CROSS/BLUE SHIELD | Admitting: Cardiology

## 2016-06-21 ENCOUNTER — Ambulatory Visit (INDEPENDENT_AMBULATORY_CARE_PROVIDER_SITE_OTHER): Payer: BLUE CROSS/BLUE SHIELD | Admitting: Cardiology

## 2016-06-21 ENCOUNTER — Encounter: Payer: Self-pay | Admitting: Cardiology

## 2016-06-21 VITALS — BP 128/90 | HR 62 | Ht 63.0 in | Wt 174.2 lb

## 2016-06-21 DIAGNOSIS — I471 Supraventricular tachycardia: Secondary | ICD-10-CM | POA: Diagnosis not present

## 2016-06-21 NOTE — Progress Notes (Signed)
Electrophysiology Office Note   Date:  06/21/2016   ID:  [name], DOB 13-Jul-1950, MRN [id number]  PCP:  Cari Caraway, MD  Cardiologist:  [name] Primary Electrophysiologist:  Nastasia Kage Jessica Leeds, MD    Chief Complaint  Patient presents with  . Follow-up    PSVT     History of Present Illness: [name] is a 66 y.o. female who presents today for electrophysiology evaluation.   Hx essential hypertension, HLD and a history of palpitations and tachycardia.  She has had a long history of paroxysmal supraventricular tachycardia. She states that she had an ablation for SVT at [facility] in 2001. Since then she has had infrequent episodes of SVT. Many of her episodes have occurred following surgical procedures. She has had an echocardiogram on 02/02/13 which showed normal left ventricular function with ejection fraction 55-60% and no significant abnormalities. She also has a history of Hashimoto's thyroiditis which is followed by her PCP.   She apparently has delayed SVT after she gets anesthesia. This occurs with both conscious sedation and with general anesthesia.  This can occur from a few hours to 3 days after the general anesthesia or conscious sedation as happened with her colonoscopy.   Today, she denies symptoms of palpitations, chest pain, shortness of breath, orthopnea, PND, lower extremity edema, claudication, dizziness, presyncope, syncope, bleeding, or neurologic sequela. The patient is tolerating medications without difficulties and is otherwise without complaint today.     Past Medical History:  Diagnosis Date  . Arthritis    Neck  . Complication of anesthesia    SVT  . Dysplasia of cervix, low grade (CIN 1)    LEEP cervical conization September 2017 margins negative CIN-1  . GERD (gastroesophageal reflux disease)   . Hyperlipidemia   . Hypothyroidism   . SVT (supraventricular tachycardia) (HCC)    Past Surgical History:  Procedure  Laterality Date  . BREAST SURGERY  1976   CYST REMOVED  . CESAREAN SECTION  1987  . HYSTEROSCOPY  9.14.2012   w/myomectomy  . left thumb surgery and Carpal Tunnel Release  9/14     Current Outpatient Prescriptions  Medication Sig Dispense Refill  . cetirizine (ZYRTEC) 10 MG tablet Take 10 mg by mouth daily as needed for allergies.     [name] esomeprazole (NEXIUM) 40 MG capsule Take 40 mg by mouth daily at 12 noon.    . fish oil-omega-3 fatty acids 1000 MG capsule Take 1 g by mouth daily.    [name] levothyroxine (SYNTHROID, LEVOTHROID) 75 MCG tablet Take 75 mcg by mouth daily before breakfast.    . methocarbamol (ROBAXIN) 500 MG tablet Take 500 mg by mouth 2 (two) times daily as needed for muscle spasms.    . metoprolol succinate (TOPROL-XL) 25 MG 24 hr tablet TAKE 1 TABLET (25 MG TOTAL) BY MOUTH EVERY 12 HOURS 180 tablet 3  . Multiple Vitamin (MULTIVITAMIN WITH MINERALS) TABS tablet Take 1 tablet by mouth daily.    . polyethylene glycol (MIRALAX / GLYCOLAX) packet Take 17 g by mouth daily as needed for mild constipation.     . Probiotic Product (PROBIOTIC ADVANCED PO) Take 1 capsule by mouth daily.     . rosuvastatin (CRESTOR) 10 MG tablet Take 10 mg by mouth 3 (three) times a week. Every Monday, Wednesday, and Friday in the evenings.    . VERAMYST 27.5 MCG/SPRAY nasal spray Place 1 spray into the nose daily as needed for allergies.     . vitamin E 400 UNIT  capsule Take 1 capsule by mouth daily.     No current facility-administered medications for this visit.     Allergies:   Lipitor [atorvastatin]; Epinephrine; and Lidocaine-epinephrine [lidocaine-epinephrine]   Social History:  The patient  reports that she has never smoked. She has never used smokeless tobacco. She reports that she drinks alcohol. She reports that she does not use drugs.   Family History:  The patient's family history includes Arthritis in her brother, father, mother, and sister; Colon polyps in her father; Glaucoma in her  brother, father, and mother; Heart disease in her father and mother; Hypertension in her brother, father, and mother; Mitral valve prolapse in her brother and mother.    ROS:  Please see the history of present illness.   Otherwise, review of systems is positive for palpitations, , back pain, muscle pain, headaches.   All other systems are reviewed and negative.    PHYSICAL EXAM: VS:  BP 128/90   Pulse 62   Ht 5\' 3"  (1.6 m)   Wt 174 lb 3.2 oz (79 kg)   LMP 11/26/2000   BMI 30.86 kg/m  , BMI Body mass index is 30.86 kg/m. GEN: Well nourished, well developed, in no acute distress  HEENT: normal  Neck: no JVD, carotid bruits, or masses Cardiac: RRR; no murmurs, rubs, or gallops,no edema  Respiratory:  clear to auscultation bilaterally, normal work of breathing GI: soft, nontender, nondistended, + BS MS: no deformity or atrophy  Skin: warm and dry Neuro:  Strength and sensation are intact Psych: euthymic mood, full affect  EKG:  EKG is not ordered today. Personal review of the ekg ordered 09/27/15 shows sinus rhythm, rate 71, RSR' in V1 and V2  Recent Labs: 09/27/2015: ALT 29 01/01/2016: BUN 19; Creatinine, Ser 0.76; Hemoglobin 13.5; Platelets 138; Potassium 4.4; Sodium 139    Lipid Panel     Component Value Date/Time   CHOL 205 (H) 12/27/2014 1055   TRIG 129 12/27/2014 1055   HDL 60 12/27/2014 1055   CHOLHDL 3.4 12/27/2014 1055   VLDL 26 12/27/2014 1055   LDLCALC 119 12/27/2014 1055     Wt Readings from Last 3 Encounters:  06/21/16 174 lb 3.2 oz (79 kg)  01/01/16 169 lb 6 oz (76.8 kg)  12/04/15 166 lb 12.8 oz (75.7 kg)      Other studies Reviewed: Additional studies/ records that were reviewed today include: TTE 2014  Review of the above records today demonstrates:  Left ventricle: The cavity size was normal. Systolic function was normal. The estimated ejection fraction was in the range of 55% to 60%. Wall motion was normal; there were no regional wall motion  abnormalities.  Personal review of event monitor 2015 shows sinus rhythm with occasional APCs.  ASSESSMENT AND PLAN:  1.  SVT: s/p ablaiton 2001. I have offered her the options of increasing metoprolol versus repeat EP study. She says that she does not feel like she wants a repeat EP study at this time as she has been feeling well over the past few years. She has presented to emergency room with palpitations, but there is no documentation of tachycardia. She has been feeling well with only a few seconds worth of tachycardia once or twice a month. She says that she does not feel like any changes need to be made and no procedures need to be scheduled. We'll continue to follow-up in one year.  2. Hypertension: well controlled on metoprolol    Current medicines are reviewed at  length with the patient today.   The patient does not have concerns regarding her medicines.  The following changes were made today:  Increase Toprol XL to 25 mg BID  Labs/ tests ordered today include:  No orders of the defined types were placed in this encounter.    Disposition:   FU with Wilkie Zenon 12 months  Signed, Abegail Kloeppel Jessica Leeds, MD  06/21/2016 11:46 AM     [facility] [address] [place] [address] [phone] (office) [phone] (fax)

## 2016-06-21 NOTE — Patient Instructions (Signed)
Medication Instructions:  Your physician recommends that you continue on your current medications as directed. Please refer to the Current Medication list given to you today.  * If you need a refill on your cardiac medications before your next appointment, please call your pharmacy.   Labwork: None ordered  Testing/Procedures: None ordered  Follow-Up: Your physician wants you to follow-up in: 1 year with Dr. Camnitz.  You will receive a reminder letter in the mail two months in advance. If you don't receive a letter, please call our office to schedule the follow-up appointment.  Thank you for choosing [facility]!!   Charline Hoskinson, RN [phone]        

## 2016-07-25 ENCOUNTER — Other Ambulatory Visit: Payer: Self-pay | Admitting: Neurosurgery

## 2016-07-25 DIAGNOSIS — D329 Benign neoplasm of meninges, unspecified: Secondary | ICD-10-CM

## 2016-09-04 ENCOUNTER — Encounter: Payer: Self-pay | Admitting: Gynecology

## 2016-10-03 ENCOUNTER — Encounter: Payer: BLUE CROSS/BLUE SHIELD | Admitting: Women's Health

## 2016-10-08 ENCOUNTER — Ambulatory Visit (INDEPENDENT_AMBULATORY_CARE_PROVIDER_SITE_OTHER): Payer: BLUE CROSS/BLUE SHIELD | Admitting: Women's Health

## 2016-10-08 ENCOUNTER — Encounter: Payer: Self-pay | Admitting: Women's Health

## 2016-10-08 VITALS — BP 122/80 | Ht 63.0 in | Wt 169.0 lb

## 2016-10-08 DIAGNOSIS — Z13820 Encounter for screening for osteoporosis: Secondary | ICD-10-CM

## 2016-10-08 DIAGNOSIS — Z01419 Encounter for gynecological examination (general) (routine) without abnormal findings: Secondary | ICD-10-CM | POA: Diagnosis not present

## 2016-10-08 NOTE — Patient Instructions (Signed)
Health Maintenance for Postmenopausal Women Menopause is a normal process in which your reproductive ability comes to an end. This process happens gradually over a span of months to years, usually between the ages of 83 and 41. Menopause is complete when you have missed 12 consecutive menstrual periods. It is important to talk with your health care provider about some of the most common conditions that affect postmenopausal women, such as heart disease, cancer, and bone loss (osteoporosis). Adopting a healthy lifestyle and getting preventive care can help to promote your health and wellness. Those actions can also lower your chances of developing some of these common conditions. What should I know about menopause? During menopause, you may experience a number of symptoms, such as:  Moderate-to-severe hot flashes.  Night sweats.  Decrease in sex drive.  Mood swings.  Headaches.  Tiredness.  Irritability.  Memory problems.  Insomnia.  Choosing to treat or not to treat menopausal changes is an individual decision that you make with your health care provider. What should I know about hormone replacement therapy and supplements? Hormone therapy products are effective for treating symptoms that are associated with menopause, such as hot flashes and night sweats. Hormone replacement carries certain risks, especially as you become older. If you are thinking about using estrogen or estrogen with progestin treatments, discuss the benefits and risks with your health care provider. What should I know about heart disease and stroke? Heart disease, heart attack, and stroke become more likely as you age. This may be due, in part, to the hormonal changes that your body experiences during menopause. These can affect how your body processes dietary fats, triglycerides, and cholesterol. Heart attack and stroke are both medical emergencies. There are many things that you can do to help prevent heart  disease and stroke:  Have your blood pressure checked at least every 1-2 years. High blood pressure causes heart disease and increases the risk of stroke.  If you are 12-82 years old, ask your health care provider if you should take aspirin to prevent a heart attack or a stroke.  Do not use any tobacco products, including cigarettes, chewing tobacco, or electronic cigarettes. If you need help quitting, ask your health care provider.  It is important to eat a healthy diet and maintain a healthy weight. ? Be sure to include plenty of vegetables, fruits, low-fat dairy products, and lean protein. ? Avoid eating foods that are high in solid fats, added sugars, or salt (sodium).  Get regular exercise. This is one of the most important things that you can do for your health. ? Try to exercise for at least 150 minutes each week. The type of exercise that you do should increase your heart rate and make you sweat. This is known as moderate-intensity exercise. ? Try to do strengthening exercises at least twice each week. Do these in addition to the moderate-intensity exercise.  Know your numbers.Ask your health care provider to check your cholesterol and your blood glucose. Continue to have your blood tested as directed by your health care provider.  What should I know about cancer screening? There are several types of cancer. Take the following steps to reduce your risk and to catch any cancer development as early as possible. Breast Cancer  Practice breast self-awareness. ? This means understanding how your breasts normally appear and feel. ? It also means doing regular breast self-exams. Let your health care provider know about any changes, no matter how small.  If you are 40  or older, have a clinician do a breast exam (clinical breast exam or CBE) every year. Depending on your age, family history, and medical history, it may be recommended that you also have a yearly breast X-ray  (mammogram).  If you have a family history of breast cancer, talk with your health care provider about genetic screening.  If you are at high risk for breast cancer, talk with your health care provider about having an MRI and a mammogram every year.  Breast cancer (BRCA) gene test is recommended for women who have family members with BRCA-related cancers. Results of the assessment will determine the need for genetic counseling and BRCA1 and for BRCA2 testing. BRCA-related cancers include these types: ? Breast. This occurs in males or females. ? Ovarian. ? Tubal. This may also be called fallopian tube cancer. ? Cancer of the abdominal or pelvic lining (peritoneal cancer). ? Prostate. ? Pancreatic.  Cervical, Uterine, and Ovarian Cancer Your health care provider may recommend that you be screened regularly for cancer of the pelvic organs. These include your ovaries, uterus, and vagina. This screening involves a pelvic exam, which includes checking for microscopic changes to the surface of your cervix (Pap test).  For women ages 21-65, health care providers may recommend a pelvic exam and a Pap test every three years. For women ages 72-65, they may recommend the Pap test and pelvic exam, combined with testing for human papilloma virus (HPV), every five years. Some types of HPV increase your risk of cervical cancer. Testing for HPV may also be done on women of any age who have unclear Pap test results.  Other health care providers may not recommend any screening for nonpregnant women who are considered low risk for pelvic cancer and have no symptoms. Ask your health care provider if a screening pelvic exam is right for you.  If you have had past treatment for cervical cancer or a condition that could lead to cancer, you need Pap tests and screening for cancer for at least 20 years after your treatment. If Pap tests have been discontinued for you, your risk factors (such as having a new sexual  partner) need to be reassessed to determine if you should start having screenings again. Some women have medical problems that increase the chance of getting cervical cancer. In these cases, your health care provider may recommend that you have screening and Pap tests more often.  If you have a family history of uterine cancer or ovarian cancer, talk with your health care provider about genetic screening.  If you have vaginal bleeding after reaching menopause, tell your health care provider.  There are currently no reliable tests available to screen for ovarian cancer.  Lung Cancer Lung cancer screening is recommended for adults 65-82 years old who are at high risk for lung cancer because of a history of smoking. A yearly low-dose CT scan of the lungs is recommended if you:  Currently smoke.  Have a history of at least 30 pack-years of smoking and you currently smoke or have quit within the past 15 years. A pack-year is smoking an average of one pack of cigarettes per day for one year.  Yearly screening should:  Continue until it has been 15 years since you quit.  Stop if you develop a health problem that would prevent you from having lung cancer treatment.  Colorectal Cancer  This type of cancer can be detected and can often be prevented.  Routine colorectal cancer screening usually begins at  age 30 and continues through age 22.  If you have risk factors for colon cancer, your health care provider may recommend that you be screened at an earlier age.  If you have a family history of colorectal cancer, talk with your health care provider about genetic screening.  Your health care provider may also recommend using home test kits to check for hidden blood in your stool.  A small camera at the end of a tube can be used to examine your colon directly (sigmoidoscopy or colonoscopy). This is done to check for the earliest forms of colorectal cancer.  Direct examination of the colon  should be repeated every 5-10 years until age 28. However, if early forms of precancerous polyps or small growths are found or if you have a family history or genetic risk for colorectal cancer, you may need to be screened more often.  Skin Cancer  Check your skin from head to toe regularly.  Monitor any moles. Be sure to tell your health care provider: ? About any new moles or changes in moles, especially if there is a change in a mole's shape or color. ? If you have a mole that is larger than the size of a pencil eraser.  If any of your family members has a history of skin cancer, especially at a [place] age, talk with your health care provider about genetic screening.  Always use sunscreen. Apply sunscreen liberally and repeatedly throughout the day.  Whenever you are outside, protect yourself by wearing long sleeves, pants, a wide-brimmed hat, and sunglasses.  What should I know about osteoporosis? Osteoporosis is a condition in which bone destruction happens more quickly than new bone creation. After menopause, you may be at an increased risk for osteoporosis. To help prevent osteoporosis or the bone fractures that can happen because of osteoporosis, the following is recommended:  If you are 62-69 years old, get at least 1,000 mg of calcium and at least 600 mg of vitamin D per day.  If you are older than age 60 but younger than age 68, get at least 1,200 mg of calcium and at least 600 mg of vitamin D per day.  If you are older than age 35, get at least 1,200 mg of calcium and at least 800 mg of vitamin D per day.  Smoking and excessive alcohol intake increase the risk of osteoporosis. Eat foods that are rich in calcium and vitamin D, and do weight-bearing exercises several times each week as directed by your health care provider. What should I know about how menopause affects my mental health? Depression may occur at any age, but it is more common as you become older. Common symptoms of  depression include:  Low or sad mood.  Changes in sleep patterns.  Changes in appetite or eating patterns.  Feeling an overall lack of motivation or enjoyment of activities that you previously enjoyed.  Frequent crying spells.  Talk with your health care provider if you think that you are experiencing depression. What should I know about immunizations? It is important that you get and maintain your immunizations. These include:  Tetanus, diphtheria, and pertussis (Tdap) booster vaccine.  Influenza every year before the flu season begins.  Pneumonia vaccine.  Shingles vaccine.  Your health care provider may also recommend other immunizations. This information is not intended to replace advice given to you by your health care provider. Make sure you discuss any questions you have with your health care provider. Document Released: 05/31/2005  Document Revised: 10/27/2015 Document Reviewed: 01/10/2015 Elsevier Interactive Patient Education  2018 [employer] Inc.  

## 2016-10-08 NOTE — Progress Notes (Signed)
[name] 06/11/1950 [id number]    History:    Presents for annual exam.  Postmenopausal on no HRT with no bleeding. 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 Not sexually active in years. Normal mammogram history. Primary care manages hypothyroidism, hypercholesteremia, hypertension, and GERD. 2016 T score -1.4 FRAX 3.1%/0.3%. Has had Zostavax at primary care. 2010 benign colon polyp 2016 negative barium enema..  Past medical history, past surgical history, family history and social history were all reviewed and documented in the EPIC chart. Recently retired from [employer]. One daughter lives local. Originally from [place].  ROS:  A ROS was performed and pertinent positives and negatives are included.  Exam:  Vitals:   10/08/16 1026  BP: 122/80  Weight: 169 lb (76.7 kg)  Height: 5\' 3"  (1.6 m)   Body mass index is 29.94 kg/m.   General appearance:  Normal Thyroid:  Symmetrical, normal in size, without palpable masses or nodularity. Respiratory  Auscultation:  Clear without wheezing or rhonchi Cardiovascular  Auscultation:  Regular rate, without rubs, murmurs or gallops  Edema/varicosities:  Not grossly evident Abdominal  Soft,nontender, without masses, guarding or rebound.  Liver/spleen:  No organomegaly noted  Hernia:  None appreciated  Skin  Inspection:  Grossly normal   Breasts: Examined lying and sitting.     Right: Without masses, retractions, discharge or axillary adenopathy.     Left: Without masses, retractions, discharge or axillary adenopathy. Gentitourinary   Inguinal/mons:  Normal without inguinal adenopathy  External genitalia:  Normal  BUS/Urethra/Skene's glands:  Normal  Vagina:  Normal  Cervix:  Normal  Uterus:  normal in size, shape and contour.  Midline and mobile  Adnexa/parametria:     Rt: Without masses or tenderness.   Lt: Without masses or tenderness.  Anus and perineum: Normal  Digital rectal exam: Normal sphincter  tone without palpated masses or tenderness  Assessment/Plan:  66 y.o. SBF G1 P1 for annual exam with no complaints.  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 Reviewed importance of home safety, fall prevention and weight bearing exercise. SBE's, continue annual screening mammogram, instructed to have results sent with primary care and our office. Calcium rich diet, vitamin D 2000 daily encouraged. Pap with HR HPV typing.     [place], 1:23 PM 10/08/2016

## 2016-10-10 LAB — HPV TYPE 16 AND 18/45 RNA
HPV Type 16 RNA: DETECTED — AB
HPV Type 18/45 RNA: NOT DETECTED

## 2016-10-11 LAB — PAP, TP IMAGING W/ HPV RNA, RFLX HPV TYPE 16,18/45: HPV mRNA, High Risk: DETECTED — AB

## 2016-10-24 ENCOUNTER — Telehealth: Payer: Self-pay | Admitting: Cardiology

## 2016-10-24 NOTE — Telephone Encounter (Signed)
Patient reports she woke up at 0430 this AM with her heart racing. She woke up startled, so she was slightly SOB for a short while but doesn't think the SOB was due to HR. She was otherwise asymptomatic. Her HR was racing for about 15 minutes. She does not know what her HR was because her pulse-ox batteries were dead. She attempted to vagal and put her head in the refrigerator. Neither worked to decrease HR. Her HR was going fast enough for her to call EMS. When they arrived, her HR was 106. She reports this is noticeably slower than when she initially called them. They did an EKG and were with her for a few minutes. They left when her HR was in the 80s.  She has been resting all day and has had no further episodes.  She states she has been stressed with a move and unpacking, all in the heat. This episode just made her nervous because her other episodes that happen sporadically only endure for a few seconds.  She has since put new batteries in her pulse-ox and her HR has been fine. Confirmed with patient she is taking her Toprol 25 mg BID. She understands she will be called with further instructions.   To Dr. Curt Bears for recommendations.

## 2016-10-24 NOTE — Telephone Encounter (Signed)
Patient calling, states that she woke up this morning with an episode of tachycardia which lasted for about 15 minutes. Patient called paramedics and by the time they arrived she was back in rhythm. Patient would like to know what she should do next. Thanks.

## 2016-10-25 NOTE — Telephone Encounter (Signed)
Advised to increase Toprol to 50 mg BID, per Dr. Curt Bears.  Patient prefers to hold off on increasing medication, at this time.  She prefers to continue to monitor and call if she experiences increased HR again.  She will call the office if/when she experiences any issues again. She thanks [place] for calling.

## 2016-12-07 ENCOUNTER — Other Ambulatory Visit: Payer: Self-pay | Admitting: Cardiology

## 2017-01-30 ENCOUNTER — Ambulatory Visit
Admission: EM | Admit: 2017-01-30 | Discharge: 2017-01-30 | Disposition: A | Discharge status: Home / Self Care | Payer: BLUE CROSS/BLUE SHIELD | Attending: Family Medicine | Admitting: Family Medicine

## 2017-01-30 ENCOUNTER — Encounter: Payer: Self-pay | Admitting: Emergency Medicine

## 2017-01-30 DIAGNOSIS — B349 Viral infection, unspecified: Secondary | ICD-10-CM

## 2017-01-30 DIAGNOSIS — J069 Acute upper respiratory infection, unspecified: Secondary | ICD-10-CM | POA: Diagnosis not present

## 2017-01-30 NOTE — ED Triage Notes (Signed)
Patient states that she got back from a cruise on Sunday.  Patient c/o sinus congestion and pressure, chills and fatigue that started on Monday.  Patient denies fevers.

## 2017-01-30 NOTE — ED Provider Notes (Signed)
[facility]    CSN: [id number] Arrival date & time: 01/30/17  1149     History   Chief Complaint Chief Complaint  Patient presents with  . Sinus Problem   HPI  66 year old female presents with the above complaint.  Patient reports that she's been sick since Sunday. She just got back from a cruise. Has been experiencing sinus pressure and congestion. Associated fatigue and body aches. No fever. No purulent nasal discharge. No cough. She's been using Nasonex and Zyrtec with some improvement. No known exacerbating factors. No other associated symptoms. No other complaints at this time.  Past Medical History:  Diagnosis Date  . Arthritis    Neck  . Complication of anesthesia    SVT  . Dysplasia of cervix, low grade (CIN 1)    LEEP cervical conization September 2017 margins negative CIN-1  . GERD (gastroesophageal reflux disease)   . Hyperlipidemia   . Hypothyroidism   . SVT (supraventricular tachycardia) [facility])    Patient Active Problem List   Diagnosis Date Noted  . CIN I (cervical intraepithelial neoplasia I) 01/02/2016  . Preoperative cardiovascular examination 11/10/2015  . ASCUS with positive high risk HPV cervical 12/13/2014  . Abnormal Papanicolaou smear of cervix with positive human papilloma virus (HPV) test 06/27/2014  . Hypokalemia 03/04/2014  . Osteopenia 04/30/2013  . Hashimoto's thyroiditis 03/29/2013  . 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 Hypothyroidism 04/27/2012  . Hyperlipidemia 04/27/2012  . SVT (supraventricular tachycardia) ([place]) 04/27/2012  . Endometrial polyp 12/04/2010  . Fibroid, uterus 11/27/2010   Past Surgical History:  Procedure Laterality Date  . BREAST SURGERY  1976   CYST REMOVED  . CESAREAN SECTION  1987  . HYSTEROSCOPY  9.14.2012   w/myomectomy  . left thumb surgery and Carpal Tunnel Release  9/14   OB History    Gravida Para Term Preterm AB Living   1 1 1     1    SAB TAB Ectopic  Multiple Live Births                 Home Medications    Prior to Admission medications   Medication Sig Start Date End Date Taking? Authorizing Provider  cetirizine (ZYRTEC) 10 MG tablet Take 10 mg by mouth daily as needed for allergies.     [provider]  fish oil-omega-3 fatty acids 1000 MG capsule Take 1 g by mouth daily.    [provider]  levothyroxine (SYNTHROID, LEVOTHROID) 75 MCG tablet Take 75 mcg by mouth daily before breakfast.    [provider]  methocarbamol (ROBAXIN) 500 MG tablet Take 500 mg by mouth 2 (two) times daily as needed for muscle spasms.    [provider]  metoprolol succinate (TOPROL-XL) 25 MG 24 hr tablet TAKE 1 TABLET BY MOUTH EVERY 12 HOURS 12/09/16   Camnitz, Ocie Doyne, MD  Multiple Vitamin (MULTIVITAMIN WITH MINERALS) TABS tablet Take 1 tablet by mouth daily.    [provider]  polyethylene glycol (MIRALAX / GLYCOLAX) packet Take 17 g by mouth daily as needed for mild constipation.     [provider]  Probiotic Product (PROBIOTIC ADVANCED PO) Take 1 capsule by mouth daily.     [provider]  rosuvastatin (CRESTOR) 10 MG tablet Take 10 mg by mouth 3 (three) times a week. Every Monday, Wednesday, and Friday in the evenings.    [provider]  VERAMYST 27.5 MCG/SPRAY nasal spray Place 1 spray into  the nose daily as needed for allergies.  04/01/13   [provider]  vitamin E 400 UNIT capsule Take 1 capsule by mouth daily.    [provider]   Family History Family History  Problem Relation Age of Onset  . Hypertension Mother   . Heart disease Mother   . Mitral valve prolapse Mother   . Glaucoma Mother   . Arthritis Mother   . Hypertension Father   . Heart disease Father   . Colon polyps Father   . Glaucoma Father   . Arthritis Father   . Hypertension Brother   . Mitral valve prolapse Brother   . Glaucoma Brother   . Arthritis Brother   . Arthritis  Sister    Social History Social History  Substance Use Topics  . Smoking status: Never Smoker  . Smokeless tobacco: Never Used  . Alcohol use Yes     Comment: rare   Allergies   Lipitor [atorvastatin]; Epinephrine; and Lidocaine-epinephrine [lidocaine-epinephrine]  Review of Systems Review of Systems  Constitutional: Positive for fatigue. Negative for fever.  HENT: Positive for congestion, sinus pain and sinus pressure.   Respiratory: Negative for cough.   Musculoskeletal:       Body aches.  All other systems reviewed and are negative.  Physical Exam Triage Vital Signs ED Triage Vitals [01/30/17 1210]  Enc Vitals Group     BP      Pulse      Resp      Temp      Temp src      SpO2      Weight 164 lb (74.4 kg)     Height 5\' 3"  (1.6 m)     Head Circumference      Peak Flow      Pain Score 0     Pain Loc      Pain Edu?      Excl. in [place]?    Updated Vital Signs BP 124/85 (BP Location: Left Arm)   Pulse 81   Temp 98.4 F (36.9 C) (Oral)   Resp 14   Ht 5\' 3"  (1.6 m)   Wt 164 lb (74.4 kg)   LMP 11/26/2000   SpO2 99%   BMI 29.05 kg/m   Physical Exam  Constitutional: She is oriented to person, place, and time. She appears well-developed. No distress.  HENT:  Head: Normocephalic and atraumatic.  Mouth/Throat: Oropharynx is clear and moist.  Normal TM's. No maxillary sinus tenderness to palpation.  Eyes: Pupils are equal, round, and reactive to light. Right eye exhibits no discharge. Left eye exhibits no discharge.  Neck: Neck supple.  Cardiovascular: Normal rate and regular rhythm.   No murmur heard. Pulmonary/Chest: Effort normal and breath sounds normal. She has no wheezes. She has no rales.  Abdominal: Soft. She exhibits no distension. There is no tenderness.  Musculoskeletal: Normal range of motion.  Lymphadenopathy:    She has no cervical adenopathy.  Neurological: She is alert and oriented to person, place, and time.  Psychiatric: She has a normal mood  and affect.  Vitals reviewed.  UC Treatments / Results  Labs (all labs ordered are listed, but only abnormal results are displayed) Labs Reviewed - No data to display  EKG  EKG Interpretation None       Radiology No results found.  Procedures Procedures (including critical care time)  Medications Ordered in UC Medications - No data to display   Initial Impression / Assessment and Plan /  UC Course  I have reviewed the triage vital signs and the nursing notes.  Pertinent labs & imaging results that were available during my care of the patient were reviewed by me and considered in my medical decision making (see chart for details).    66 year old female presents with a viral upper respiration infection. No need for antibiotics at this time. Continue Nasonex and over-the-counter antihistamine. Rest and fluids.  Final Clinical Impressions(s) / UC Diagnoses   Final diagnoses:  Viral upper respiratory tract infection   New Prescriptions Discharge Medication List as of 01/30/2017 12:23 PM     Controlled Substance Prescriptions [place] Controlled Substance Registry consulted? Not Applicable   Coral Spikes, DO 01/30/17 1244

## 2017-01-30 NOTE — Discharge Instructions (Signed)
Rest, fluids.  Continue Nasonex.  Feel better.  Dr. Lacinda Axon

## 2017-02-19 ENCOUNTER — Ambulatory Visit
Admission: EM | Admit: 2017-02-19 | Discharge: 2017-02-19 | Disposition: A | Discharge status: Home / Self Care | Payer: BLUE CROSS/BLUE SHIELD | Attending: Family Medicine | Admitting: Family Medicine

## 2017-02-19 ENCOUNTER — Encounter: Payer: Self-pay | Admitting: *Deleted

## 2017-02-19 DIAGNOSIS — R0981 Nasal congestion: Secondary | ICD-10-CM

## 2017-02-19 DIAGNOSIS — H6690 Otitis media, unspecified, unspecified ear: Secondary | ICD-10-CM

## 2017-02-19 DIAGNOSIS — J324 Chronic pansinusitis: Secondary | ICD-10-CM

## 2017-02-19 DIAGNOSIS — H6692 Otitis media, unspecified, left ear: Secondary | ICD-10-CM

## 2017-02-19 MED ORDER — AMOXICILLIN-POT CLAVULANATE 875-125 MG PO TABS: 1.0000 | ORAL_TABLET | Freq: Two times a day (BID) | ORAL | 0 refills | Status: DC

## 2017-02-19 NOTE — ED Provider Notes (Signed)
[facility]    CSN: [id number] Arrival date & time: 02/19/17  1806     History   Chief Complaint Chief Complaint  Patient presents with  . Fever  . Nasal Congestion    HPI [name] is a 66 y.o. female.   Patient is a 66 year old female who presents with complaint of sinus pressure, congestion, pain behind her eyes, bandlike headache, cough, fever and chills. Patient was seen here on October 11 diagnosed with viral sinusitis. She had a tooth extracted from the upper jaw 2 weeks ago and was told she has inflammation and then had bone fragments removed on Monday, [name] reporting that it seemed more inflamed. Patient states she try some Nasonex a day without any drainage from the nose afterwards. She reports some intermittent ear aches for the last couple of weeks. Patient reports the chills and flush feeling started this afternoon. Of note she is on a 12 day prednisone taper that was started today for lower back pain with sciatica.      Past Medical History:  Diagnosis Date  . Arthritis    Neck  . Complication of anesthesia    SVT  . Dysplasia of cervix, low grade (CIN 1)    LEEP cervical conization September 2017 margins negative CIN-1  . GERD (gastroesophageal reflux disease)   . Hyperlipidemia   . Hypothyroidism   . SVT (supraventricular tachycardia) [facility])     Patient Active Problem List   Diagnosis Date Noted  . CIN I (cervical intraepithelial neoplasia I) 01/02/2016  . Preoperative cardiovascular examination 11/10/2015  . ASCUS with positive high risk HPV cervical 12/13/2014  . Abnormal Papanicolaou smear of cervix with positive human papilloma virus (HPV) test 06/27/2014  . Hypokalemia 03/04/2014  . Osteopenia 04/30/2013  . Hashimoto's thyroiditis 03/29/2013  . 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 Hypothyroidism 04/27/2012  . Hyperlipidemia 04/27/2012  . SVT (supraventricular tachycardia) ([place])  04/27/2012  . Endometrial polyp 12/04/2010  . Fibroid, uterus 11/27/2010    Past Surgical History:  Procedure Laterality Date  . BREAST SURGERY  1976   CYST REMOVED  . CESAREAN SECTION  1987  . HYSTEROSCOPY  9.14.2012   w/myomectomy  . left thumb surgery and Carpal Tunnel Release  9/14    OB History    Gravida Para Term Preterm AB Living   1 1 1     1    SAB TAB Ectopic Multiple Live Births                   Home Medications    Prior to Admission medications   Medication Sig Start Date End Date Taking? Authorizing Provider  fish oil-omega-3 fatty acids 1000 MG capsule Take 1 g by mouth daily.   Yes [provider]  levothyroxine (SYNTHROID, LEVOTHROID) 75 MCG tablet Take 75 mcg by mouth daily before breakfast.   Yes [provider]  metoprolol succinate (TOPROL-XL) 25 MG 24 hr tablet TAKE 1 TABLET BY MOUTH EVERY 12 HOURS 12/09/16  Yes Camnitz, Will Hassell Done, MD  Multiple Vitamin (MULTIVITAMIN WITH MINERALS) TABS tablet Take 1 tablet by mouth daily.   Yes [provider]  polyethylene glycol (MIRALAX / GLYCOLAX) packet Take 17 g by mouth daily as needed for mild constipation.    Yes [provider]  Probiotic Product (PROBIOTIC ADVANCED PO) Take 1 capsule by mouth daily.    Yes [provider]  rosuvastatin (CRESTOR) 10 MG tablet Take 10 mg by  mouth 3 (three) times a week. Every Monday, Wednesday, and Friday in the evenings.   Yes [provider]  vitamin E 400 UNIT capsule Take 1 capsule by mouth daily.   Yes [provider]  amoxicillin-clavulanate (AUGMENTIN) 875-125 MG tablet Take 1 tablet by mouth every 12 (twelve) hours. 02/19/17   Luvenia Redden, PA-C  cetirizine (ZYRTEC) 10 MG tablet Take 10 mg by mouth daily as needed for allergies.     [provider]  methocarbamol (ROBAXIN) 500 MG tablet Take 500 mg by mouth 2 (two) times daily as needed for muscle spasms.    [provider]  VERAMYST  27.5 MCG/SPRAY nasal spray Place 1 spray into the nose daily as needed for allergies.  04/01/13   [provider]    Family History Family History  Problem Relation Age of Onset  . Hypertension Mother   . Heart disease Mother   . Mitral valve prolapse Mother   . Glaucoma Mother   . Arthritis Mother   . Hypertension Father   . Heart disease Father   . Colon polyps Father   . Glaucoma Father   . Arthritis Father   . Hypertension Brother   . Mitral valve prolapse Brother   . Glaucoma Brother   . Arthritis Brother   . Arthritis Sister     Social History Social History  Substance Use Topics  . Smoking status: Never Smoker  . Smokeless tobacco: Never Used  . Alcohol use Yes     Comment: rare     Allergies   Lipitor [atorvastatin]; Epinephrine; and Lidocaine-epinephrine [lidocaine-epinephrine]   Review of Systems Review of Systems  -As noted above in history of present illness. Other systems reviewed and found to be negative   Physical Exam Triage Vital Signs ED Triage Vitals  Enc Vitals Group     BP 02/19/17 1815 (!) 141/87     Pulse Rate 02/19/17 1815 (!) 101     Resp 02/19/17 1815 18     Temp 02/19/17 1815 (!) 100.7 F (38.2 C)     Temp Source 02/19/17 1815 Oral     SpO2 02/19/17 1815 98 %     Weight --      Height --      Head Circumference --      Peak Flow --      Pain Score 02/19/17 1820 8     Pain Loc --      Pain Edu? --      Excl. in [place]? --    No data found.   Updated Vital Signs BP (!) 141/87 (BP Location: Left Arm)   Pulse (!) 101   Temp (!) 100.7 F (38.2 C) (Oral)   Resp 18   LMP 11/26/2000   SpO2 98%   Visual Acuity Right Eye Distance:   Left Eye Distance:   Bilateral Distance:    Right Eye Near:   Left Eye Near:    Bilateral Near:     Physical Exam  Constitutional: She appears well-developed and well-nourished.  Ill appearing  HENT:  Left Ear: Tympanic membrane is injected. A middle ear effusion is present.    Nose: Mucosal edema present. Right sinus exhibits frontal sinus tenderness. Left sinus exhibits maxillary sinus tenderness and frontal sinus tenderness.  Mouth/Throat: Uvula is midline.  Unable to visualize R TM due to cerumen. Mild throat redness, some post nasal drainage  Eyes: Pupils are equal, round, and reactive to light. EOM are normal.  Neck: Normal range of motion.  Cardiovascular: Normal rate, regular rhythm and normal heart sounds.   Pulmonary/Chest: Effort normal and breath sounds normal. No stridor. She has no wheezes.  Abdominal: Soft.  Lymphadenopathy:    She has cervical adenopathy.  Neurological: She is alert. No cranial nerve deficit.  Skin: Skin is warm and dry.     UC Treatments / Results  Labs (all labs ordered are listed, but only abnormal results are displayed) Labs Reviewed - No data to display  EKG  EKG Interpretation None       Radiology No results found.  Procedures Procedures (including critical care time)  Medications Ordered in UC Medications - No data to display   Initial Impression / Assessment and Plan / UC Course  I have reviewed the triage vital signs and the nursing notes.  Pertinent labs & imaging results that were available during my care of the patient were reviewed by me and considered in my medical decision making (see chart for details).    Patient with sinus pain and pressure 2 weeks with worsening symptoms. To provider 0.7 here in the clinic. Patient also with recent dental work on the upper jaw.  Final Clinical Impressions(s) / UC Diagnoses   Final diagnoses:  Pansinusitis, unspecified chronicity  Nasal congestion  Otitis media, unspecified laterality, unspecified otitis media type   Will give prescription for Augmentin and will have continue Zyrtec. Information given for sinus rinse.   New Prescriptions New Prescriptions   AMOXICILLIN-CLAVULANATE (AUGMENTIN) 875-125 MG TABLET    Take 1 tablet by mouth every 12  (twelve) hours.     Controlled Substance Prescriptions [place] Controlled Substance Registry consulted? Not Applicable   Luvenia Redden, PA-C 02/19/17 1901

## 2017-02-19 NOTE — Discharge Instructions (Signed)
-  augmentin: one tablet twice a day for 7 days -Tylenol or ibuprofen for pain and fever -continue Zyrtec and Nasonex -can try nasal sinus rinse, attached, to help open up sinuses and passages -return to clinic if no improvement or if symptoms

## 2017-02-19 NOTE — ED Triage Notes (Signed)
Patient has had sinus pressure and headache pain for approximately 3 weeks. Patient was recently at the dentist 3 days ago for oral surgery on her top right. Patient states that the dentist reported significant inflammation. Severe sinus pressure, headache, and fever have become more severe in the last 3 days.

## 2017-04-07 DIAGNOSIS — H43811 Vitreous degeneration, right eye: Secondary | ICD-10-CM | POA: Insufficient documentation

## 2017-04-07 DIAGNOSIS — H04123 Dry eye syndrome of bilateral lacrimal glands: Secondary | ICD-10-CM | POA: Diagnosis not present

## 2017-04-07 DIAGNOSIS — E079 Disorder of thyroid, unspecified: Secondary | ICD-10-CM | POA: Insufficient documentation

## 2017-04-07 DIAGNOSIS — E0500 Thyrotoxicosis with diffuse goiter without thyrotoxic crisis or storm: Secondary | ICD-10-CM | POA: Diagnosis not present

## 2017-04-07 DIAGNOSIS — H5789 Other specified disorders of eye and adnexa: Secondary | ICD-10-CM | POA: Insufficient documentation

## 2017-04-07 DIAGNOSIS — H25813 Combined forms of age-related cataract, bilateral: Secondary | ICD-10-CM | POA: Diagnosis not present

## 2017-04-09 DIAGNOSIS — R1012 Left upper quadrant pain: Secondary | ICD-10-CM | POA: Diagnosis not present

## 2017-05-05 ENCOUNTER — Other Ambulatory Visit: Payer: Self-pay | Admitting: Physician Assistant

## 2017-05-05 DIAGNOSIS — R110 Nausea: Secondary | ICD-10-CM

## 2017-05-05 DIAGNOSIS — R1011 Right upper quadrant pain: Secondary | ICD-10-CM

## 2017-05-06 DIAGNOSIS — I471 Supraventricular tachycardia: Secondary | ICD-10-CM | POA: Diagnosis not present

## 2017-05-06 DIAGNOSIS — E039 Hypothyroidism, unspecified: Secondary | ICD-10-CM | POA: Diagnosis not present

## 2017-05-06 DIAGNOSIS — R1011 Right upper quadrant pain: Secondary | ICD-10-CM | POA: Diagnosis not present

## 2017-05-06 DIAGNOSIS — K5900 Constipation, unspecified: Secondary | ICD-10-CM | POA: Diagnosis not present

## 2017-05-06 DIAGNOSIS — J309 Allergic rhinitis, unspecified: Secondary | ICD-10-CM | POA: Diagnosis not present

## 2017-05-06 DIAGNOSIS — E785 Hyperlipidemia, unspecified: Secondary | ICD-10-CM | POA: Diagnosis not present

## 2017-05-08 ENCOUNTER — Ambulatory Visit
Admission: RE | Admit: 2017-05-08 | Discharge: 2017-05-08 | Disposition: A | Discharge status: Home / Self Care | Payer: BLUE CROSS/BLUE SHIELD | Source: Ambulatory Visit | Attending: Physician Assistant | Admitting: Physician Assistant

## 2017-05-08 DIAGNOSIS — R110 Nausea: Secondary | ICD-10-CM

## 2017-05-08 DIAGNOSIS — R1011 Right upper quadrant pain: Secondary | ICD-10-CM

## 2017-05-09 ENCOUNTER — Other Ambulatory Visit (HOSPITAL_COMMUNITY): Payer: Self-pay | Admitting: Physician Assistant

## 2017-05-09 ENCOUNTER — Other Ambulatory Visit: Payer: Self-pay | Admitting: Physician Assistant

## 2017-05-09 DIAGNOSIS — R1011 Right upper quadrant pain: Secondary | ICD-10-CM

## 2017-05-09 DIAGNOSIS — R110 Nausea: Secondary | ICD-10-CM

## 2017-05-13 DIAGNOSIS — M542 Cervicalgia: Secondary | ICD-10-CM | POA: Diagnosis not present

## 2017-05-13 DIAGNOSIS — M62838 Other muscle spasm: Secondary | ICD-10-CM | POA: Diagnosis not present

## 2017-05-14 ENCOUNTER — Encounter (HOSPITAL_COMMUNITY)
Admission: RE | Admit: 2017-05-14 | Discharge: 2017-05-14 | Disposition: A | Discharge status: Home / Self Care | Payer: Medicare Other | Source: Ambulatory Visit | Attending: Physician Assistant | Admitting: Physician Assistant

## 2017-05-14 DIAGNOSIS — R110 Nausea: Secondary | ICD-10-CM | POA: Diagnosis not present

## 2017-05-14 DIAGNOSIS — R1011 Right upper quadrant pain: Secondary | ICD-10-CM | POA: Diagnosis not present

## 2017-05-14 MED ORDER — TECHNETIUM TC 99M MEBROFENIN IV KIT
5.0000 | PACK | Freq: Once | INTRAVENOUS | Status: AC | PRN
  Administered 2017-05-14: 5 via INTRAVENOUS

## 2017-05-15 DIAGNOSIS — M542 Cervicalgia: Secondary | ICD-10-CM | POA: Diagnosis not present

## 2017-05-15 DIAGNOSIS — M62838 Other muscle spasm: Secondary | ICD-10-CM | POA: Diagnosis not present

## 2017-05-19 ENCOUNTER — Encounter (HOSPITAL_COMMUNITY): Payer: BLUE CROSS/BLUE SHIELD

## 2017-05-19 DIAGNOSIS — M542 Cervicalgia: Secondary | ICD-10-CM | POA: Diagnosis not present

## 2017-05-19 DIAGNOSIS — M62838 Other muscle spasm: Secondary | ICD-10-CM | POA: Diagnosis not present

## 2017-05-22 DIAGNOSIS — M542 Cervicalgia: Secondary | ICD-10-CM | POA: Diagnosis not present

## 2017-05-22 DIAGNOSIS — M62838 Other muscle spasm: Secondary | ICD-10-CM | POA: Diagnosis not present

## 2017-08-05 ENCOUNTER — Encounter (HOSPITAL_COMMUNITY): Payer: Self-pay

## 2017-08-05 ENCOUNTER — Emergency Department (HOSPITAL_COMMUNITY)
Admission: EM | Admit: 2017-08-05 | Discharge: 2017-08-06 | Disposition: A | Discharge status: Home / Self Care | Payer: Medicare Other | Attending: Emergency Medicine | Admitting: Emergency Medicine

## 2017-08-05 ENCOUNTER — Emergency Department (HOSPITAL_COMMUNITY): Payer: Medicare Other

## 2017-08-05 DIAGNOSIS — Z79899 Other long term (current) drug therapy: Secondary | ICD-10-CM | POA: Insufficient documentation

## 2017-08-05 DIAGNOSIS — E860 Dehydration: Secondary | ICD-10-CM | POA: Diagnosis not present

## 2017-08-05 DIAGNOSIS — E039 Hypothyroidism, unspecified: Secondary | ICD-10-CM | POA: Insufficient documentation

## 2017-08-05 DIAGNOSIS — R42 Dizziness and giddiness: Secondary | ICD-10-CM | POA: Diagnosis not present

## 2017-08-05 DIAGNOSIS — R002 Palpitations: Secondary | ICD-10-CM | POA: Insufficient documentation

## 2017-08-05 DIAGNOSIS — R110 Nausea: Secondary | ICD-10-CM | POA: Diagnosis present

## 2017-08-05 DIAGNOSIS — R531 Weakness: Secondary | ICD-10-CM | POA: Diagnosis not present

## 2017-08-05 DIAGNOSIS — R109 Unspecified abdominal pain: Secondary | ICD-10-CM | POA: Diagnosis not present

## 2017-08-05 DIAGNOSIS — I119 Hypertensive heart disease without heart failure: Secondary | ICD-10-CM | POA: Insufficient documentation

## 2017-08-05 LAB — URINALYSIS, ROUTINE W REFLEX MICROSCOPIC
Bacteria, UA: NONE SEEN
Bilirubin Urine: NEGATIVE
Glucose, UA: NEGATIVE mg/dL
Ketones, ur: NEGATIVE mg/dL
Leukocytes, UA: NEGATIVE
Nitrite: NEGATIVE
Protein, ur: NEGATIVE mg/dL
Specific Gravity, Urine: 1.006 (ref 1.005–1.030)
pH: 7 (ref 5.0–8.0)

## 2017-08-05 LAB — I-STAT TROPONIN, ED: Troponin i, poc: 0 ng/mL (ref 0.00–0.08)

## 2017-08-05 LAB — CBC
HCT: 38.8 % (ref 36.0–46.0)
Hemoglobin: 14.1 g/dL (ref 12.0–15.0)
MCH: 29.8 pg (ref 26.0–34.0)
MCHC: 36.3 g/dL — ABNORMAL HIGH (ref 30.0–36.0)
MCV: 82 fL (ref 78.0–100.0)
Platelets: 154 10*3/uL (ref 150–400)
RBC: 4.73 MIL/uL (ref 3.87–5.11)
RDW: 15.3 % (ref 11.5–15.5)
WBC: 4.4 10*3/uL (ref 4.0–10.5)

## 2017-08-05 LAB — COMPREHENSIVE METABOLIC PANEL
ALT: 21 U/L (ref 14–54)
AST: 23 U/L (ref 15–41)
Albumin: 4.4 g/dL (ref 3.5–5.0)
Alkaline Phosphatase: 89 U/L (ref 38–126)
Anion gap: 9 (ref 5–15)
BUN: 10 mg/dL (ref 6–20)
CO2: 25 mmol/L (ref 22–32)
Calcium: 10.3 mg/dL (ref 8.9–10.3)
Chloride: 108 mmol/L (ref 101–111)
Creatinine, Ser: 0.86 mg/dL (ref 0.44–1.00)
GFR calc Af Amer: >60 mL/min (ref >60)
GFR calc non Af Amer: >60 mL/min (ref >60)
Glucose, Bld: 95 mg/dL (ref 65–99)
Potassium: 3.5 mmol/L (ref 3.5–5.1)
Sodium: 142 mmol/L (ref 135–145)
Total Bilirubin: 0.7 mg/dL (ref 0.3–1.2)
Total Protein: 7.1 g/dL (ref 6.5–8.1)

## 2017-08-05 LAB — LIPASE, BLOOD: Lipase: 37 U/L (ref 11–51)

## 2017-08-05 MED ORDER — SODIUM CHLORIDE 0.9 % IV BOLUS
1000.0000 mL | Freq: Once | INTRAVENOUS | Status: AC
Start: 2017-08-05 — End: 2017-08-06
  Administered 2017-08-05: 1000 mL via INTRAVENOUS

## 2017-08-05 MED ORDER — ONDANSETRON HCL 4 MG/2ML IJ SOLN
4.0000 mg | Freq: Once | INTRAMUSCULAR | Status: AC
  Administered 2017-08-05: 4 mg via INTRAVENOUS
  Filled 2017-08-05: qty 2

## 2017-08-05 NOTE — ED Triage Notes (Signed)
Pt presents for evaluation of palpitations and weakness today. Hx of SVT. Pt reports generalized weakness this AM, states feels dehydrated. States has been on abx and not feeling well.

## 2017-08-05 NOTE — ED Provider Notes (Signed)
Patient placed in Quick Look pathway, seen and evaluated   Chief Complaint: Generalized weakness  HPI:   67 y.o. F with PMH/o SVT who presents for evaluation of generalized weakness, nausea, abdominal discomfort and palpitations.  Patient reports that over the last several days, she has felt generalized weak.  Denies any focal weakness. Patient states she feels like she is dehydrated.  Patient reports she has had some abdominal discomfort and states she has had some nausea, decreased appetite.  No vomiting noted.  Patient reports that this afternoon, she felt like she started having palpitations.  On ED arrival, she states that those have improved.  Patient reports that she was recently placed on amoxicillin for sinus infection.  Patient denies any fevers, vomiting.  ROS: generalized weakness, nausea, palpitations   Physical Exam:   Gen: No distress  Neuro: Awake and Alert  Skin: Warm    Focused Exam: Abdomen is soft, nondistended.  Diffuse tenderness with no focal point.  No rigidity, guarding.  Lungs clear to auscultation.  Patient is tachycardic at rate 110 but appears regular rhythm.    Initiation of care has begun. The patient has been counseled on the process, plan, and necessity for staying for the completion/evaluation, and the remainder of the medical screening examination    Desma Mcgregor 08/05/17 Dennard Schaumann, MD 08/06/17 [phone]

## 2017-08-05 NOTE — Discharge Instructions (Addendum)
Please return for any problem. Follow up with your regular physician as instructed.  °

## 2017-08-05 NOTE — ED Provider Notes (Signed)
[facility] DEPARTMENT Provider Note   CSN: [id number] Arrival date & time: 08/05/17  1810     History   Chief Complaint Chief Complaint  Patient presents with  . Palpitations  . Weakness    HPI [name] is a 67 y.o. female.  67 year old female with prior history of SVT, hyperlipidemia, and sinusitis presents for evaluation of mild nausea and abdominal discomfort.  Patient reports that she is currently being treated for sinusitis.  She is on the 2 of a Z-Pak.  After starting the antibiotic she started to experience abdominal cramps and mild nausea.  She was given a prescription for Zofran which she has not yet filled.  She denies associated fever.  She denies diarrhea.  She reports that she probably has not been drinking very well for the last several days secondary to URI symptoms.  This morning she felt mildly lightheaded and felt like her heart was going too fast when she stood rapidly.  At that time she decided to come to the ED for evaluation and IV fluids.  The history is provided by the patient.  Illness  This is a new problem. The current episode started more than 2 days ago. The problem occurs rarely. The problem has not changed since onset.Pertinent negatives include no chest pain and no abdominal pain. Nothing aggravates the symptoms. Nothing relieves the symptoms. She has tried nothing for the symptoms. The treatment provided no relief.    Past Medical History:  Diagnosis Date  . Arthritis    Neck  . Complication of anesthesia    SVT  . Dysplasia of cervix, low grade (CIN 1)    LEEP cervical conization September 2017 margins negative CIN-1  . GERD (gastroesophageal reflux disease)   . Hyperlipidemia   . Hypothyroidism   . SVT (supraventricular tachycardia) [facility])     Patient Active Problem List   Diagnosis Date Noted  . CIN I (cervical intraepithelial neoplasia I) 01/02/2016  . Preoperative cardiovascular examination  11/10/2015  . ASCUS with positive high risk HPV cervical 12/13/2014  . Abnormal Papanicolaou smear of cervix with positive human papilloma virus (HPV) test 06/27/2014  . Hypokalemia 03/04/2014  . Osteopenia 04/30/2013  . Hashimoto's thyroiditis 03/29/2013  . 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 Hypothyroidism 04/27/2012  . Hyperlipidemia 04/27/2012  . SVT (supraventricular tachycardia) ([place]) 04/27/2012  . Endometrial polyp 12/04/2010  . Fibroid, uterus 11/27/2010    Past Surgical History:  Procedure Laterality Date  . BREAST SURGERY  1976   CYST REMOVED  . CESAREAN SECTION  1987  . HYSTEROSCOPY  9.14.2012   w/myomectomy  . left thumb surgery and Carpal Tunnel Release  9/14     OB History    Gravida  1   Para  1   Term  1   Preterm      AB      Living  1     SAB      TAB      Ectopic      Multiple      Live Births               Home Medications    Prior to Admission medications   Medication Sig Start Date End Date Taking? Authorizing Provider  azithromycin (ZITHROMAX) 250 MG tablet Take 250-500 mg by mouth daily. Take 500 mg on day 1 then 250 mg for the next four days   Yes [provider]  budesonide (  PULMICORT) 0.5 MG/2ML nebulizer solution Take 0.5 mg by nebulization 2 (two) times daily. Mix with mupirocin 20 mg cap   Yes [provider]  cetirizine (ZYRTEC) 10 MG tablet Take 10 mg by mouth daily as needed for allergies.    Yes [provider]  fish oil-omega-3 fatty acids 1000 MG capsule Take 1 g by mouth daily.   Yes [provider]  levothyroxine (SYNTHROID, LEVOTHROID) 75 MCG tablet Take 75 mcg by mouth daily before breakfast.   Yes [provider]  methocarbamol (ROBAXIN) 500 MG tablet Take 500 mg by mouth 2 (two) times daily as needed for muscle spasms.   Yes [provider]  metoprolol succinate (TOPROL-XL) 25 MG 24 hr tablet TAKE 1 TABLET BY MOUTH EVERY 12  HOURS Patient taking differently: TAKE 1 TABLET BY MOUTH EVERY EVENING. 12/09/16  Yes Camnitz, Will Hassell Done, MD  Multiple Vitamin (MULTIVITAMIN WITH MINERALS) TABS tablet Take 1 tablet by mouth daily.   Yes [provider]  MUPIROCIN CALCIUM NA Place 20 mg into the nose 2 (two) times daily.   Yes [provider]  olopatadine (PATANOL) 0.1 % ophthalmic solution Place 1 drop into both eyes 2 (two) times daily.   Yes [provider]  polyethylene glycol (MIRALAX / GLYCOLAX) packet Take 17 g by mouth daily as needed for mild constipation.    Yes [provider]  Probiotic Product (PROBIOTIC ADVANCED PO) Take 1 capsule by mouth daily.    Yes [provider]  rosuvastatin (CRESTOR) 10 MG tablet Take 10 mg by mouth 3 (three) times a week. Every Monday, Wednesday, and Friday in the evenings.   Yes [provider]  TURMERIC PO Take 1,000 mg by mouth daily.   Yes [provider]  vitamin E 400 UNIT capsule Take 1 capsule by mouth daily.   Yes [provider]    Family History Family History  Problem Relation Age of Onset  . Hypertension Mother   . Heart disease Mother   . Mitral valve prolapse Mother   . Glaucoma Mother   . Arthritis Mother   . Hypertension Father   . Heart disease Father   . Colon polyps Father   . Glaucoma Father   . Arthritis Father   . Hypertension Brother   . Mitral valve prolapse Brother   . Glaucoma Brother   . Arthritis Brother   . Arthritis Sister     Social History Social History   Tobacco Use  . Smoking status: Never Smoker  . Smokeless tobacco: Never Used  Substance Use Topics  . Alcohol use: Yes    Comment: rare  . Drug use: No     Allergies   Lipitor [atorvastatin]; Epinephrine; and Lidocaine-epinephrine [lidocaine-epinephrine]   Review of Systems Review of Systems  Cardiovascular: Negative for chest pain.  Gastrointestinal: Negative for abdominal pain.  All other systems  reviewed and are negative.    Physical Exam Updated Vital Signs BP 133/88   Pulse 67   Temp 98.5 F (36.9 C) (Oral)   Resp 18   LMP 11/26/2000   SpO2 99%   Physical Exam  Constitutional: She is oriented to person, place, and time. She appears well-developed and well-nourished. No distress.  HENT:  Head: Normocephalic and atraumatic.  Mouth/Throat: Oropharynx is clear and moist.  Eyes: Pupils are equal, round, and reactive to light. Conjunctivae and EOM are normal.  Neck: Normal range of motion. Neck supple.  Cardiovascular: Normal rate, regular rhythm and normal  heart sounds.  Pulmonary/Chest: Effort normal and breath sounds normal. No respiratory distress.  Abdominal: Soft. She exhibits no distension. There is no tenderness.  Musculoskeletal: Normal range of motion. She exhibits no edema or deformity.  Neurological: She is alert and oriented to person, place, and time. No cranial nerve deficit.  Alert Oriented x4 Normal speech No facial droop Normal 5 out of 5 strength in all 4 extremity's  Skin: Skin is warm and dry.  Psychiatric: She has a normal mood and affect.  Nursing note and vitals reviewed.    ED Treatments / Results  Labs (all labs ordered are listed, but only abnormal results are displayed) Labs Reviewed  CBC - Abnormal; Notable for the following components:      Result Value   MCHC 36.3 (*)    All other components within normal limits  URINALYSIS, ROUTINE W REFLEX MICROSCOPIC - Abnormal; Notable for the following components:   Color, Urine STRAW (*)    Hgb urine dipstick SMALL (*)    Squamous Epithelial / LPF 0-5 (*)    All other components within normal limits  LIPASE, BLOOD  COMPREHENSIVE METABOLIC PANEL  I-STAT TROPONIN, ED    EKG EKG Interpretation  Date/Time:  Tuesday August 05 2017 18:14:59 EDT Ventricular Rate:  113 PR Interval:  136 QRS Duration: 74 QT Interval:  316 QTC Calculation: 433 R Axis:   -6 Text Interpretation:  Sinus  tachycardia Otherwise normal ECG Confirmed by Dene Gentry [phone]) on 08/05/2017 10:07:01 PM   Radiology Dg Chest 2 View  Result Date: 08/05/2017 CLINICAL DATA:  Pt presents for evaluation of generalized weakness, nausea, abdominal discomfort and palpitations. Patient reports that over the last several days, she has felt generalized weak. Denies any focal weakness. Patient states she fe.*comment was truncated* EXAM: CHEST - 2 VIEW COMPARISON:  None. FINDINGS: Normal cardiac silhouette. No effusion, infiltrate pneumothorax. Mild linear atelectasis over the LEFT hemidiaphragm. No acute osseous abnormality. IMPRESSION: Mild LEFT lung base atelectasis.  No infiltrate or edema. Electronically Signed   By: Suzy Bouchard M.D.   On: 08/05/2017 19:12    Procedures Procedures (including critical care time)  Medications Ordered in ED Medications  sodium chloride 0.9 % bolus 1,000 mL (1,000 mLs Intravenous New Bag/Given 08/05/17 2244)  ondansetron (ZOFRAN) injection 4 mg (4 mg Intravenous Given 08/05/17 2244)     Initial Impression / Assessment and Plan / ED Course  I have reviewed the triage vital signs and the nursing notes.  Pertinent labs & imaging results that were available during my care of the patient were reviewed by me and considered in my medical decision making (see chart for details).     MDM  Screen complete  Patient's presentation is suggestive of mild dehydration. Screening EKG, Labs, and CXR are without significant findings.  Patient feels improved following IV fluids and Zofran.  She now desires discharge home.  Close follow-up is advised.  Strict return precautions are given and understood.  Final Clinical Impressions(s) / ED Diagnoses   Final diagnoses:  Dehydration    ED Discharge Orders    None       Valarie Merino, MD 08/05/17 2358

## 2017-10-14 ENCOUNTER — Ambulatory Visit: Payer: Medicare Other | Admitting: Women's Health

## 2017-10-14 ENCOUNTER — Encounter: Payer: Self-pay | Admitting: Women's Health

## 2017-10-14 VITALS — BP 126/80 | Ht 63.0 in | Wt 179.0 lb

## 2017-10-14 DIAGNOSIS — Z13820 Encounter for screening for osteoporosis: Secondary | ICD-10-CM

## 2017-10-14 DIAGNOSIS — R87619 Unspecified abnormal cytological findings in specimens from cervix uteri: Secondary | ICD-10-CM | POA: Diagnosis not present

## 2017-10-14 DIAGNOSIS — Z01419 Encounter for gynecological examination (general) (routine) without abnormal findings: Secondary | ICD-10-CM | POA: Diagnosis not present

## 2017-10-14 NOTE — Progress Notes (Signed)
[name] July 27, 1950 [id number]    History:    Presents for 01/2016  LEEP for persistent CIN-1.  2018 Pap normal with positive high risk HPV 16, negative for 18 and 45.  Normal mammogram history.  2016 T score -1.4 FRAX 3.1% / 0.3%.  2010 benign colon polyp, 2016- barium enema.  Primary care manages hypothyroidism, hypertension and hypercholesteremia.   Past medical history, past surgical history, family history and social history were all reviewed and documented in the EPIC chart.  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  Originally from [place].  One daughter who lives local.  ROS:  A ROS was performed and pertinent positives and negatives are included.  Exam:  Vitals:   10/14/17 1633  BP: 126/80  Weight: 179 lb (81.2 kg)  Height: 5\' 3"  (1.6 m)   Body mass index is 31.71 kg/m.   General appearance:  Normal Thyroid:  Symmetrical, normal in size, without palpable masses or nodularity. Respiratory  Auscultation:  Clear without wheezing or rhonchi Cardiovascular  Auscultation:  Regular rate, without rubs, murmurs or gallops  Edema/varicosities:  Not grossly evident Abdominal  Soft,nontender, without masses, guarding or rebound.  Liver/spleen:  No organomegaly noted  Hernia:  None appreciated  Skin  Inspection:  Grossly normal   Breasts: Examined lying and sitting.     Right: Without masses, retractions, discharge or axillary adenopathy.     Left: Without masses, retractions, discharge or axillary adenopathy. Gentitourinary   Inguinal/mons:  Normal without inguinal adenopathy  External genitalia:  Normal  BUS/Urethra/Skene's glands:  Normal  Vagina:  Normal  Cervix:  Normal  Uterus:   normal in size, shape and contour.  Midline and mobile  Adnexa/parametria:     Rt: Without masses or tenderness.   Lt: Without masses or tenderness.  Anus and perineum: Normal  Digital rectal exam: Normal sphincter tone without palpated masses or tenderness  Assessment/Plan:  67  y.o. WBF G1P1 for breast and pelvic exam with Pap.  2017 LEEP for persistent CIN-1, positive HPV #16 2018 Hypothyroid, hypertension, hypercholesteremia-primary care managing labs and meds Osteopenia without elevated FRAX Obesity  Plan: Pap with HR HPV typing, SBE's, continue annual screening mammogram due in August.  Encouraged to continue regular exercise, balance as well as weightbearing exercises, decrease calorie/carbs, vitamin D 2000 daily encouraged.  Schedule DEXA.  Safety, fall prevention discussed.    Huel Cote [facility], 6:24 PM 10/14/2017

## 2017-10-14 NOTE — Patient Instructions (Signed)
Health Maintenance for Postmenopausal Women Menopause is a normal process in which your reproductive ability comes to an end. This process happens gradually over a span of months to years, usually between the ages of 22 and 9. Menopause is complete when you have missed 12 consecutive menstrual periods. It is important to talk with your health care provider about some of the most common conditions that affect postmenopausal women, such as heart disease, cancer, and bone loss (osteoporosis). Adopting a healthy lifestyle and getting preventive care can help to promote your health and wellness. Those actions can also lower your chances of developing some of these common conditions. What should I know about menopause? During menopause, you may experience a number of symptoms, such as:  Moderate-to-severe hot flashes.  Night sweats.  Decrease in sex drive.  Mood swings.  Headaches.  Tiredness.  Irritability.  Memory problems.  Insomnia.  Choosing to treat or not to treat menopausal changes is an individual decision that you make with your health care provider. What should I know about hormone replacement therapy and supplements? Hormone therapy products are effective for treating symptoms that are associated with menopause, such as hot flashes and night sweats. Hormone replacement carries certain risks, especially as you become older. If you are thinking about using estrogen or estrogen with progestin treatments, discuss the benefits and risks with your health care provider. What should I know about heart disease and stroke? Heart disease, heart attack, and stroke become more likely as you age. This may be due, in part, to the hormonal changes that your body experiences during menopause. These can affect how your body processes dietary fats, triglycerides, and cholesterol. Heart attack and stroke are both medical emergencies. There are many things that you can do to help prevent heart disease  and stroke:  Have your blood pressure checked at least every 1-2 years. High blood pressure causes heart disease and increases the risk of stroke.  If you are 53-22 years old, ask your health care provider if you should take aspirin to prevent a heart attack or a stroke.  Do not use any tobacco products, including cigarettes, chewing tobacco, or electronic cigarettes. If you need help quitting, ask your health care provider.  It is important to eat a healthy diet and maintain a healthy weight. ? Be sure to include plenty of vegetables, fruits, low-fat dairy products, and lean protein. ? Avoid eating foods that are high in solid fats, added sugars, or salt (sodium).  Get regular exercise. This is one of the most important things that you can do for your health. ? Try to exercise for at least 150 minutes each week. The type of exercise that you do should increase your heart rate and make you sweat. This is known as moderate-intensity exercise. ? Try to do strengthening exercises at least twice each week. Do these in addition to the moderate-intensity exercise.  Know your numbers.Ask your health care provider to check your cholesterol and your blood glucose. Continue to have your blood tested as directed by your health care provider.  What should I know about cancer screening? There are several types of cancer. Take the following steps to reduce your risk and to catch any cancer development as early as possible. Breast Cancer  Practice breast self-awareness. ? This means understanding how your breasts normally appear and feel. ? It also means doing regular breast self-exams. Let your health care provider know about any changes, no matter how small.  If you are 40  or older, have a clinician do a breast exam (clinical breast exam or CBE) every year. Depending on your age, family history, and medical history, it may be recommended that you also have a yearly breast X-ray (mammogram).  If you  have a family history of breast cancer, talk with your health care provider about genetic screening.  If you are at high risk for breast cancer, talk with your health care provider about having an MRI and a mammogram every year.  Breast cancer (BRCA) gene test is recommended for women who have family members with BRCA-related cancers. Results of the assessment will determine the need for genetic counseling and BRCA1 and for BRCA2 testing. BRCA-related cancers include these types: ? Breast. This occurs in males or females. ? Ovarian. ? Tubal. This may also be called fallopian tube cancer. ? Cancer of the abdominal or pelvic lining (peritoneal cancer). ? Prostate. ? Pancreatic.  Cervical, Uterine, and Ovarian Cancer Your health care provider may recommend that you be screened regularly for cancer of the pelvic organs. These include your ovaries, uterus, and vagina. This screening involves a pelvic exam, which includes checking for microscopic changes to the surface of your cervix (Pap test).  For women ages 21-65, health care providers may recommend a pelvic exam and a Pap test every three years. For women ages 79-65, they may recommend the Pap test and pelvic exam, combined with testing for human papilloma virus (HPV), every five years. Some types of HPV increase your risk of cervical cancer. Testing for HPV may also be done on women of any age who have unclear Pap test results.  Other health care providers may not recommend any screening for nonpregnant women who are considered low risk for pelvic cancer and have no symptoms. Ask your health care provider if a screening pelvic exam is right for you.  If you have had past treatment for cervical cancer or a condition that could lead to cancer, you need Pap tests and screening for cancer for at least 20 years after your treatment. If Pap tests have been discontinued for you, your risk factors (such as having a new sexual partner) need to be  reassessed to determine if you should start having screenings again. Some women have medical problems that increase the chance of getting cervical cancer. In these cases, your health care provider may recommend that you have screening and Pap tests more often.  If you have a family history of uterine cancer or ovarian cancer, talk with your health care provider about genetic screening.  If you have vaginal bleeding after reaching menopause, tell your health care provider.  There are currently no reliable tests available to screen for ovarian cancer.  Lung Cancer Lung cancer screening is recommended for adults 69-62 years old who are at high risk for lung cancer because of a history of smoking. A yearly low-dose CT scan of the lungs is recommended if you:  Currently smoke.  Have a history of at least 30 pack-years of smoking and you currently smoke or have quit within the past 15 years. A pack-year is smoking an average of one pack of cigarettes per day for one year.  Yearly screening should:  Continue until it has been 15 years since you quit.  Stop if you develop a health problem that would prevent you from having lung cancer treatment.  Colorectal Cancer  This type of cancer can be detected and can often be prevented.  Routine colorectal cancer screening usually begins at  age 42 and continues through age 45.  If you have risk factors for colon cancer, your health care provider may recommend that you be screened at an earlier age.  If you have a family history of colorectal cancer, talk with your health care provider about genetic screening.  Your health care provider may also recommend using home test kits to check for hidden blood in your stool.  A small camera at the end of a tube can be used to examine your colon directly (sigmoidoscopy or colonoscopy). This is done to check for the earliest forms of colorectal cancer.  Direct examination of the colon should be repeated every  5-10 years until age 71. However, if early forms of precancerous polyps or small growths are found or if you have a family history or genetic risk for colorectal cancer, you may need to be screened more often.  Skin Cancer  Check your skin from head to toe regularly.  Monitor any moles. Be sure to tell your health care provider: ? About any new moles or changes in moles, especially if there is a change in a mole's shape or color. ? If you have a mole that is larger than the size of a pencil eraser.  If any of your family members has a history of skin cancer, especially at a Brenson Hartman age, talk with your health care provider about genetic screening.  Always use sunscreen. Apply sunscreen liberally and repeatedly throughout the day.  Whenever you are outside, protect yourself by wearing long sleeves, pants, a wide-brimmed hat, and sunglasses.  What should I know about osteoporosis? Osteoporosis is a condition in which bone destruction happens more quickly than new bone creation. After menopause, you may be at an increased risk for osteoporosis. To help prevent osteoporosis or the bone fractures that can happen because of osteoporosis, the following is recommended:  If you are 46-71 years old, get at least 1,000 mg of calcium and at least 600 mg of vitamin D per day.  If you are older than age 55 but younger than age 65, get at least 1,200 mg of calcium and at least 600 mg of vitamin D per day.  If you are older than age 54, get at least 1,200 mg of calcium and at least 800 mg of vitamin D per day.  Smoking and excessive alcohol intake increase the risk of osteoporosis. Eat foods that are rich in calcium and vitamin D, and do weight-bearing exercises several times each week as directed by your health care provider. What should I know about how menopause affects my mental health? Depression may occur at any age, but it is more common as you become older. Common symptoms of depression  include:  Low or sad mood.  Changes in sleep patterns.  Changes in appetite or eating patterns.  Feeling an overall lack of motivation or enjoyment of activities that you previously enjoyed.  Frequent crying spells.  Talk with your health care provider if you think that you are experiencing depression. What should I know about immunizations? It is important that you get and maintain your immunizations. These include:  Tetanus, diphtheria, and pertussis (Tdap) booster vaccine.  Influenza every year before the flu season begins.  Pneumonia vaccine.  Shingles vaccine.  Your health care provider may also recommend other immunizations. This information is not intended to replace advice given to you by your health care provider. Make sure you discuss any questions you have with your health care provider. Document Released: 05/31/2005  Document Revised: 10/27/2015 Document Reviewed: 01/10/2015 Elsevier Interactive Patient Education  2018 [employer]  

## 2017-10-15 LAB — NO CULTURE INDICATED

## 2017-10-15 LAB — URINALYSIS, COMPLETE W/RFL CULTURE
Bacteria, UA: NONE SEEN /HPF
Bilirubin Urine: NEGATIVE
Glucose, UA: NEGATIVE
Hgb urine dipstick: NEGATIVE
Hyaline Cast: NONE SEEN /LPF
Ketones, ur: NEGATIVE
Leukocyte Esterase: NEGATIVE
Nitrites, Initial: NEGATIVE
Protein, ur: NEGATIVE
RBC / HPF: NONE SEEN /HPF (ref 0–2)
Specific Gravity, Urine: 1.016 (ref 1.001–1.03)
Squamous Epithelial / LPF: NONE SEEN /HPF (ref <=5)
WBC, UA: NONE SEEN /HPF (ref 0–5)
pH: <=5 (ref 5.0–8.0)

## 2017-10-15 NOTE — Addendum Note (Signed)
Addended by: Lorine Bears on: 10/15/2017 08:12 AM   Modules accepted: Orders

## 2017-10-16 ENCOUNTER — Other Ambulatory Visit: Payer: Self-pay | Admitting: Gynecology

## 2017-10-16 ENCOUNTER — Encounter (INDEPENDENT_AMBULATORY_CARE_PROVIDER_SITE_OTHER): Payer: Self-pay

## 2017-10-16 DIAGNOSIS — Z780 Asymptomatic menopausal state: Secondary | ICD-10-CM

## 2017-10-16 LAB — PAP, TP IMAGING W/ HPV RNA, RFLX HPV TYPE 16,18/45: HPV DNA High Risk: NOT DETECTED

## 2017-10-20 DIAGNOSIS — M8580 Other specified disorders of bone density and structure, unspecified site: Secondary | ICD-10-CM

## 2017-10-20 HISTORY — DX: Other specified disorders of bone density and structure, unspecified site: M85.80

## 2017-10-21 ENCOUNTER — Other Ambulatory Visit: Payer: Self-pay | Admitting: Gynecology

## 2017-10-21 ENCOUNTER — Ambulatory Visit (INDEPENDENT_AMBULATORY_CARE_PROVIDER_SITE_OTHER): Payer: Medicare Other

## 2017-10-21 DIAGNOSIS — Z780 Asymptomatic menopausal state: Secondary | ICD-10-CM

## 2017-10-21 DIAGNOSIS — M8589 Other specified disorders of bone density and structure, multiple sites: Secondary | ICD-10-CM

## 2017-10-22 ENCOUNTER — Encounter: Payer: Self-pay | Admitting: Gynecology

## 2017-10-24 ENCOUNTER — Other Ambulatory Visit: Payer: Self-pay | Admitting: Sports Medicine

## 2017-10-24 DIAGNOSIS — M5416 Radiculopathy, lumbar region: Secondary | ICD-10-CM

## 2017-10-24 DIAGNOSIS — M545 Low back pain: Secondary | ICD-10-CM

## 2017-10-28 ENCOUNTER — Ambulatory Visit
Admission: RE | Admit: 2017-10-28 | Discharge: 2017-10-28 | Disposition: A | Discharge status: Home / Self Care | Payer: Medicare Other | Source: Ambulatory Visit | Attending: Sports Medicine | Admitting: Sports Medicine

## 2017-10-28 DIAGNOSIS — M545 Low back pain: Secondary | ICD-10-CM

## 2017-10-28 DIAGNOSIS — M5416 Radiculopathy, lumbar region: Secondary | ICD-10-CM

## 2017-11-07 ENCOUNTER — Telehealth: Payer: Self-pay | Admitting: *Deleted

## 2017-11-07 NOTE — Telephone Encounter (Signed)
Patient called and left message c/o breast soreness, I spoke with patient and told her OV for exam, transferred to appointment desk.

## 2017-11-10 ENCOUNTER — Telehealth: Payer: Self-pay | Admitting: *Deleted

## 2017-11-10 ENCOUNTER — Encounter: Payer: Self-pay | Admitting: Women's Health

## 2017-11-10 ENCOUNTER — Ambulatory Visit: Payer: Medicare Other | Admitting: Women's Health

## 2017-11-10 VITALS — BP 138/80

## 2017-11-10 DIAGNOSIS — N644 Mastodynia: Secondary | ICD-10-CM

## 2017-11-10 NOTE — Progress Notes (Signed)
67--year-old WBF G1, P1 presents with complaint of bilateral breast tenderness for the past 2 weeks mostly in the lower aspects of each breast.  History of fibroglandular breasts, normal mammogram 11/2016.  No family history of breast cancer.  No change in routine or medications.  Denies palpable changes or injury.  Postmenopausal on no HRT, not sexually active.  Primary care manages hypothyroidism, hypertension and hypercholesteremia.  Exam: Appears well but worried.  Breast exam in sitting and lying position without palpable masses, nodules, nipple discharge, visible dimpling or retractions.  Tenderness noted in lower breast at 6 o'clock position of both breasts.  Bilateral breast tenderness  Plan: Diagnostic bilateral mammogram with ultrasound at [facility].  Will schedule.  Could try over-the-counter vitamin E twice daily.

## 2017-11-10 NOTE — Telephone Encounter (Signed)
Patient scheduled at [facility] on 11/13/17 @ 9:15am, order faxed patient aware.

## 2017-11-10 NOTE — Telephone Encounter (Signed)
-----   Message from Huel Cote, NP sent at 11/10/2017 10:30 AM EDT ----- Needs diagnostic mammogram bilat for tendernes in both breasts lower aspects of both breasts for 2 weeks.  Post menopausal on no HRT, no change in routine. [facility] anytime ok except this FRI am

## 2017-11-10 NOTE — Patient Instructions (Signed)

## 2017-11-18 ENCOUNTER — Encounter: Payer: Self-pay | Admitting: Women's Health

## 2017-12-08 ENCOUNTER — Encounter (HOSPITAL_COMMUNITY): Payer: Self-pay | Admitting: Emergency Medicine

## 2017-12-08 ENCOUNTER — Other Ambulatory Visit: Payer: Self-pay

## 2017-12-08 ENCOUNTER — Emergency Department (HOSPITAL_COMMUNITY)
Admission: EM | Admit: 2017-12-08 | Discharge: 2017-12-08 | Disposition: A | Discharge status: Home / Self Care | Payer: Medicare Other | Attending: Emergency Medicine | Admitting: Emergency Medicine

## 2017-12-08 ENCOUNTER — Telehealth: Payer: Self-pay | Admitting: Cardiology

## 2017-12-08 DIAGNOSIS — I471 Supraventricular tachycardia: Secondary | ICD-10-CM | POA: Diagnosis not present

## 2017-12-08 DIAGNOSIS — Z79899 Other long term (current) drug therapy: Secondary | ICD-10-CM | POA: Diagnosis not present

## 2017-12-08 DIAGNOSIS — R000 Tachycardia, unspecified: Secondary | ICD-10-CM | POA: Diagnosis not present

## 2017-12-08 DIAGNOSIS — L299 Pruritus, unspecified: Secondary | ICD-10-CM | POA: Diagnosis not present

## 2017-12-08 DIAGNOSIS — E039 Hypothyroidism, unspecified: Secondary | ICD-10-CM | POA: Diagnosis not present

## 2017-12-08 DIAGNOSIS — R002 Palpitations: Secondary | ICD-10-CM | POA: Diagnosis not present

## 2017-12-08 DIAGNOSIS — I10 Essential (primary) hypertension: Secondary | ICD-10-CM | POA: Diagnosis not present

## 2017-12-08 NOTE — Telephone Encounter (Signed)
Spoke with the patient who is concerned because yesterday she broke out in a generalized rash and itching.  She took Benadryl 25mg  @ 2:00pm and began feeling drowsy and developed a headache.    At 11:00pm she took her Metoprolol and went to bed.  She awoke at 12:45am and noticed palpitations and elevated HR (she doesn't know value).  She went to a nearby Psychologist, occupational and had a BP 180/100 hr 60.  They advised her to [facility] where the BP 150/90 (then 110/70) began stabilizing.  Today she feels fine, just tired and anxious, with no palpitations.  Please advise, thank you.

## 2017-12-08 NOTE — Telephone Encounter (Signed)
Recommend event monitor for palpitations and check BP daily for a week and call with results. Try to follow a low 2gm sodium diet.

## 2017-12-08 NOTE — Telephone Encounter (Signed)
New Message:    Pt said she was seen in [facility] ER last night for Palpitatoions. She wants Dr [name] to review her records and see if she needs to be seen or what she advises.

## 2017-12-08 NOTE — ED Triage Notes (Signed)
Pt BIB [facility], pt lying down when she felt palpitations, hx SVT, on EMS arrival pt in NSR, and symptoms had resolved. Denies chest pain/shortness of breath.

## 2017-12-08 NOTE — ED Provider Notes (Signed)
[place] [facility] EMERGENCY DEPARTMENT Provider Note   CSN: [id number] Arrival date & time: 12/08/17  0202     History   Chief Complaint Chief Complaint  Patient presents with  . Palpitations    HPI [name] is a 67 y.o. female.  Patient presents to the emergency department for evaluation of palpitations.  Patient thinks that she had an episode of SVT tonight.  She has a previous history of SVT.  She reports that she was awakened from sleep with shortness of breath and palpitations with rapid heartbeat.  She reports that she normally can drink some water or put her head in the freezer to convert her SVT, but this did not work Midwife.  She went to the fire house and while awaiting paramedics to arrive apparently converted to sinus rhythm.  Her symptoms have completely resolved at arrival to the ER.  She is not experiencing any chest pain.     Past Medical History:  Diagnosis Date  . Arthritis    Neck  . Complication of anesthesia    SVT  . Dysplasia of cervix, low grade (CIN 1)    LEEP cervical conization September 2017 margins negative CIN-1  . GERD (gastroesophageal reflux disease)   . Hyperlipidemia   . Hypothyroidism   . Osteopenia 10/2017   T score -1.9 FRAX 8.8% / 0.7%.  Stable from prior DEXA.  [name] SVT (supraventricular tachycardia) [facility])     Patient Active Problem List   Diagnosis Date Noted  . CIN I (cervical intraepithelial neoplasia I) 01/02/2016  . Preoperative cardiovascular examination 11/10/2015  . ASCUS with positive high risk HPV cervical 12/13/2014  . Abnormal Papanicolaou smear of cervix with positive human papilloma virus (HPV) test 06/27/2014  . Hypokalemia 03/04/2014  . Osteopenia 04/30/2013  . Hashimoto's thyroiditis 03/29/2013  . 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 Hypothyroidism 04/27/2012  . Hyperlipidemia 04/27/2012  . SVT (supraventricular tachycardia) ([place]) 04/27/2012  .  Endometrial polyp 12/04/2010  . Fibroid, uterus 11/27/2010    Past Surgical History:  Procedure Laterality Date  . BREAST SURGERY  1976   CYST REMOVED  . CESAREAN SECTION  1987  . HYSTEROSCOPY  9.14.2012   w/myomectomy  . left thumb surgery and Carpal Tunnel Release  9/14     OB History    Gravida  1   Para  1   Term  1   Preterm      AB      Living  1     SAB      TAB      Ectopic      Multiple      Live Births               Home Medications    Prior to Admission medications   Medication Sig Start Date End Date Taking? Authorizing Provider  fish oil-omega-3 fatty acids 1000 MG capsule Take 1 g by mouth daily.    [provider]  levothyroxine (SYNTHROID, LEVOTHROID) 75 MCG tablet Take 75 mcg by mouth daily before breakfast.    [provider]  metoprolol succinate (TOPROL-XL) 25 MG 24 hr tablet TAKE 1 TABLET BY MOUTH EVERY 12 HOURS Patient taking differently: TAKE 1 TABLET BY MOUTH EVERY EVENING. 12/09/16   Camnitz, Ocie Doyne, MD  Multiple Vitamin (MULTIVITAMIN WITH MINERALS) TABS tablet Take 1 tablet by mouth daily.    [provider]  MUPIROCIN CALCIUM NA Place 20 mg into the nose  2 (two) times daily.    [provider]  olopatadine (PATANOL) 0.1 % ophthalmic solution Place 1 drop into both eyes 2 (two) times daily.    [provider]  polyethylene glycol (MIRALAX / GLYCOLAX) packet Take 17 g by mouth daily as needed for mild constipation.     [provider]  Probiotic Product (PROBIOTIC ADVANCED PO) Take 1 capsule by mouth daily.     [provider]  rosuvastatin (CRESTOR) 10 MG tablet Take 10 mg by mouth 3 (three) times a week. Every Monday, Wednesday, and Friday in the evenings.    [provider]  vitamin E 400 UNIT capsule Take 1 capsule by mouth daily.    [provider]    Family History Family History  Problem Relation Age of Onset  . Hypertension Mother   .  Heart disease Mother   . Mitral valve prolapse Mother   . Glaucoma Mother   . Arthritis Mother   . Hypertension Father   . Heart disease Father   . Colon polyps Father   . Glaucoma Father   . Arthritis Father   . Hypertension Brother   . Mitral valve prolapse Brother   . Glaucoma Brother   . Arthritis Brother   . Arthritis Sister     Social History Social History   Tobacco Use  . Smoking status: Never Smoker  . Smokeless tobacco: Never Used  Substance Use Topics  . Alcohol use: Yes    Comment: rare  . Drug use: No     Allergies   Lipitor [atorvastatin]; Epinephrine; and Lidocaine-epinephrine [lidocaine-epinephrine]   Review of Systems Review of Systems  Respiratory: Positive for shortness of breath.   Cardiovascular: Positive for palpitations.  All other systems reviewed and are negative.    Physical Exam Updated Vital Signs BP 136/88 (BP Location: Right Arm)   Pulse 71   Temp 98.1 F (36.7 C) (Oral)   Resp 13   LMP 11/26/2000   SpO2 97%   Physical Exam  Constitutional: She is oriented to person, place, and time. She appears well-developed and well-nourished. No distress.  HENT:  Head: Normocephalic and atraumatic.  Right Ear: Hearing normal.  Left Ear: Hearing normal.  Nose: Nose normal.  Mouth/Throat: Oropharynx is clear and moist and mucous membranes are normal.  Eyes: Pupils are equal, round, and reactive to light. Conjunctivae and EOM are normal.  Neck: Normal range of motion. Neck supple.  Cardiovascular: Regular rhythm, S1 normal and S2 normal. Exam reveals no gallop and no friction rub.  No murmur heard. Pulmonary/Chest: Effort normal and breath sounds normal. No respiratory distress. She exhibits no tenderness.  Abdominal: Soft. Normal appearance and bowel sounds are normal. There is no hepatosplenomegaly. There is no tenderness. There is no rebound, no guarding, no tenderness at McBurney's point and negative Murphy's sign. No hernia.    Musculoskeletal: Normal range of motion.  Neurological: She is alert and oriented to person, place, and time. She has normal strength. No cranial nerve deficit or sensory deficit. Coordination normal. GCS eye subscore is 4. GCS verbal subscore is 5. GCS motor subscore is 6.  Skin: Skin is warm, dry and intact. No rash noted. No cyanosis.  Psychiatric: She has a normal mood and affect. Her speech is normal and behavior is normal. Thought content normal.  Nursing note and vitals reviewed.    ED Treatments / Results  Labs (all labs ordered are listed, but only abnormal results are displayed) Labs Reviewed -  No data to display  EKG EKG Interpretation  Date/Time:  Monday December 08 2017 02:09:53 EDT Ventricular Rate:  71 PR Interval:    QRS Duration: 93 QT Interval:  390 QTC Calculation: 424 R Axis:   29 Text Interpretation:  Sinus rhythm Abnormal R-wave progression, early transition Baseline wander in lead(s) II III aVF Confirmed by Orpah Greek [phone]) on 12/08/2017 2:40:26 AM   Radiology No results found.  Procedures Procedures (including critical care time)  Medications Ordered in ED Medications - No data to display   Initial Impression / Assessment and Plan / ED Course  I have reviewed the triage vital signs and the nursing notes.  Pertinent labs & imaging results that were available during my care of the patient were reviewed by me and considered in my medical decision making (see chart for details).     She presents to the emergency department for evaluation of palpitations.  Patient has a history of SVT.  Patient had a prolonged episode of racing heartbeat and palpitations prior to arrival.  By the time EMS put her on the monitor her symptoms had resolved.  She was brought to the ER in her normal state of health.  Vital signs are normal.  She was placed on a monitor and monitored for an extended period of time.  No recurrent arrhythmia.  This has been a  recurrent problem for her, she does not require further work-up.  Continue her current medications, follow-up with cardiology as an outpatient.  Final Clinical Impressions(s) / ED Diagnoses   Final diagnoses:  Palpitations  SVT (supraventricular tachycardia) [facility]    ED Discharge Orders    None       Orpah Greek, MD 12/08/17 [phone]

## 2017-12-09 NOTE — Telephone Encounter (Signed)
Called patient and made her aware that Dr. [name] would like for her to wear an event monitor for her palpitations. Patient aware that she will be contacted by [facility] to schedule. Also made patient aware that she needs to monitor BP QD x 1 week and call back with readings. Instructed patient to avoid salt in her diet and follow a 2 gm sodium diet. Patient verbalized understanding and thanked me for the call.

## 2017-12-15 ENCOUNTER — Telehealth: Payer: Self-pay | Admitting: Cardiology

## 2017-12-15 NOTE — Telephone Encounter (Signed)
New Message    Patient is calling because she has an order for an event monitor. She wants to know if Dr. [name] is wanting her to still get the monitor since she had the episode over a week ago. Please call.

## 2017-12-15 NOTE — Telephone Encounter (Signed)
Called patient back about her question. Encouraged patient to get monitor. Patient agreed to get monitor put on, but had to change her appointment time. Patient will come in on Wednesday instead of Tuesday.

## 2017-12-17 ENCOUNTER — Ambulatory Visit (INDEPENDENT_AMBULATORY_CARE_PROVIDER_SITE_OTHER): Payer: Medicare Other

## 2017-12-17 DIAGNOSIS — R002 Palpitations: Secondary | ICD-10-CM

## 2017-12-19 ENCOUNTER — Telehealth: Payer: Self-pay | Admitting: Cardiology

## 2017-12-19 NOTE — Telephone Encounter (Signed)
Spoke with patient her cell phone for the monitor is not holding a charge. I asked her to call the company and they can help troubleshoot why and might have to send her new equipment

## 2017-12-19 NOTE — Telephone Encounter (Signed)
° ° °  Patient calling with concerns that monitor is not staying charged long enough. Please call

## 2017-12-20 ENCOUNTER — Other Ambulatory Visit: Payer: Self-pay | Admitting: Cardiology

## 2018-01-05 DIAGNOSIS — K581 Irritable bowel syndrome with constipation: Secondary | ICD-10-CM | POA: Diagnosis not present

## 2018-01-21 ENCOUNTER — Other Ambulatory Visit: Payer: Self-pay | Admitting: Cardiology

## 2018-01-26 ENCOUNTER — Telehealth: Payer: Self-pay

## 2018-01-26 MED ORDER — METOPROLOL SUCCINATE ER 25 MG PO TB24: ORAL_TABLET | ORAL | 3 refills | Status: DC

## 2018-01-26 NOTE — Telephone Encounter (Signed)
Spoke with the patient, she accepted the Toprol dose change and is scheduled for 10/30 at 12:30 pm. Advised if any problems with the increase dose to call the office.

## 2018-01-26 NOTE — Telephone Encounter (Signed)
-----   Message from Sueanne Margarita, MD sent at 01/26/2018  8:57 AM EDT ----- Please let patient know that heart monitor did show a couple episodes of an extra heart rhythm from the top of her heart.  Please have her increase Toprol to 50 mg every morning and 25 mg every afternoon and follow-up with extender in 2 to 3 weeks.

## 2018-02-18 ENCOUNTER — Ambulatory Visit: Payer: Medicare Other | Admitting: Physician Assistant

## 2018-02-18 ENCOUNTER — Encounter: Payer: Self-pay | Admitting: Physician Assistant

## 2018-02-18 VITALS — BP 120/88 | HR 58 | Ht 63.0 in | Wt 174.4 lb

## 2018-02-18 DIAGNOSIS — I471 Supraventricular tachycardia: Secondary | ICD-10-CM

## 2018-02-18 DIAGNOSIS — I119 Hypertensive heart disease without heart failure: Secondary | ICD-10-CM

## 2018-02-18 MED ORDER — METOPROLOL SUCCINATE ER 50 MG PO TB24: ORAL_TABLET | ORAL | 3 refills | Status: DC

## 2018-02-18 NOTE — Patient Instructions (Signed)
Medication Instructions:  DECREASE: Metoprolol to 50 MG daily  If you need a refill on your cardiac medications before your next appointment, please call your pharmacy.   Lab work: None  If you have labs (blood work) drawn today and your tests are completely normal, you will receive your results only by: [name] MyChart Message (if you have MyChart) OR . A paper copy in the mail If you have any lab test that is abnormal or we need to change your treatment, we will call you to review the results.  Testing/Procedures: None  Follow-Up: Please schedule a appointment with Dr. Shonna Chock to discuss SVT  Any Other Special Instructions Will Be Listed Below (If Applicable).

## 2018-02-18 NOTE — Progress Notes (Signed)
Cardiology Office Note    Date:  02/18/2018   ID:  [name], DOB [birth year], MRN [id number]  PCP:  Cari Caraway, MD  Cardiologist: Fransico Him, MD EPS: Will Meredith Leeds, MD  Chief Complaint  Patient presents with  . Palpitations    History of Present Illness:  [name] is a 67 y.o. female history of SVT status post ablation 2001 at [facility] CT, hypertension, HLD, Hashimoto's thyroiditis.  2D echo 2014 normal LVEF 55 to 60%.  A lot of episodes of SVT occurred after she gets anesthesia for surgery.  Patient saw Dr. Curt Bears 06/21/2016 at which time he offered repeat EP study versus increase metoprolol.  She preferred to increase her metoprolol.  She was in the ER 07/2017 and 11/2017 with recurrent SVT.  Cardiac monitor 12/17/2017 showed normal sinus rhythm, sinus bradycardia, sinus tachycardia with some nonsustained atrial tachycardia up to 13 beats and occasional PVCs with PVC burden of 1%.  Heart rate ranged from 42 to 185 bpm.  Dr. [name] increased her Toprol to 50 mg in the morning 25 in the afternoon.  Patient feels dizzy and off balance on higher dose Toprol. She went from 25 mg once a day to 75 mg daily and thinks it's too much.  Has had some occasional palpitations since his last episode but overall has been stable.  Says the episode in August occurred after taking Benadryl and 5 mg.  The sedation seems to set her off.  She is avoided eye surgery because of it causing SVT.  Thyroid checked by PCP 01/21/2018 and was stable.  She has not had any excessive caffeine or decongestants.  Past Medical History:  Diagnosis Date  . Arthritis    Neck  . Complication of anesthesia    SVT  . Dysplasia of cervix, low grade (CIN 1)    LEEP cervical conization September 2017 margins negative CIN-1  . GERD (gastroesophageal reflux disease)   . Hyperlipidemia   . Hypothyroidism   . Osteopenia 10/2017   T score -1.9 FRAX 8.8% / 0.7%.  Stable from prior DEXA.    [name] SVT (supraventricular tachycardia) (HCC)     Past Surgical History:  Procedure Laterality Date  . BREAST SURGERY  1976   CYST REMOVED  . CESAREAN SECTION  1987  . HYSTEROSCOPY  9.14.2012   w/myomectomy  . left thumb surgery and Carpal Tunnel Release  9/14    Current Medications: Current Meds  Medication Sig  . bacitracin ophthalmic ointment Place 1 application into both eyes as needed.  . Cholecalciferol (VITAMIN D-3) 5000 units TABS Take 1 tablet by mouth daily.  . cycloSPORINE (RESTASIS) 0.05 % ophthalmic emulsion Place 1 drop into both eyes daily.  . fish oil-omega-3 fatty acids 1000 MG capsule Take 1 g by mouth daily.  . fluticasone (FLONASE) 50 MCG/ACT nasal spray Place 1 spray into both nostrils as needed.  . Lactobacillus Rhamnosus, GG, (CULTURELLE) CAPS Take 1 Dose by mouth daily.  [name] levothyroxine (SYNTHROID, LEVOTHROID) 75 MCG tablet Take 75 mcg by mouth daily before breakfast.  . metoprolol succinate (TOPROL-XL) 50 MG 24 hr tablet Take 1 tablet by mouth daily  . Multiple Vitamin (MULTIVITAMIN WITH MINERALS) TABS tablet Take 1 tablet by mouth daily.  . polyethylene glycol (MIRALAX / GLYCOLAX) packet Take 17 g by mouth daily as needed for mild constipation.   . Probiotic Product (PROBIOTIC ADVANCED PO) Take 1 capsule by mouth daily.   . rosuvastatin (CRESTOR) 10 MG tablet Take 10  mg by mouth 3 (three) times a week. Every Monday, Wednesday, and Friday in the evenings.  . vitamin E 400 UNIT capsule Take 1 capsule by mouth daily.  . [DISCONTINUED] metoprolol succinate (TOPROL-XL) 25 MG 24 hr tablet Take 50 mg, 2 tablets, by mouth, in the morning and take 25 mg, 1 tablet, by mouth, in the afternoon     Allergies:   Lipitor [atorvastatin]; Epinephrine; and Lidocaine-epinephrine [lidocaine-epinephrine]   Social History   Socioeconomic History  . Marital status: Widowed    Spouse name: Not on file  . Number of children: Not on file  . Years of education: Not on file  .  Highest education level: Not on file  Occupational History  . Not on file  Social Needs  . Financial resource strain: Not on file  . Food insecurity:    Worry: Not on file    Inability: Not on file  . Transportation needs:    Medical: Not on file    Non-medical: Not on file  Tobacco Use  . Smoking status: Never Smoker  . Smokeless tobacco: Never Used  Substance and Sexual Activity  . Alcohol use: Yes    Comment: rare  . Drug use: No  . Sexual activity: Not Currently    Partners: Male    Birth control/protection: Post-menopausal    Comment: 67 YEARS OLD, NO MORE THAN 5 PARTNERS ,des neg  Lifestyle  . Physical activity:    Days per week: Not on file    Minutes per session: Not on file  . Stress: Not on file  Relationships  . Social connections:    Talks on phone: Not on file    Gets together: Not on file    Attends religious service: Not on file    Active member of club or organization: Not on file    Attends meetings of clubs or organizations: Not on file    Relationship status: Not on file  Other Topics Concern  . Not on file  Social History Narrative  . Not on file     Family History:  The patient's family history includes Arthritis in her brother, father, mother, and sister; Colon polyps in her father; Glaucoma in her brother, father, and mother; Heart disease in her father and mother; Hypertension in her brother, father, and mother; Mitral valve prolapse in her brother and mother.   ROS:   Please see the history of present illness.    Review of Systems  Constitution: Negative.  HENT: Negative.   Eyes: Negative.   Cardiovascular: Positive for palpitations.  Respiratory: Negative.   Hematologic/Lymphatic: Negative.   Musculoskeletal: Positive for myalgias. Negative for joint pain.  Gastrointestinal: Negative.   Genitourinary: Negative.   Neurological: Negative.    All other systems reviewed and are negative.   PHYSICAL EXAM:   VS:  BP 120/88   Pulse (!)  58   Ht 5\' 3"  (1.6 m)   Wt 174 lb 6.4 oz (79.1 kg)   LMP 11/26/2000   SpO2 99%   BMI 30.89 kg/m   Physical Exam  GEN: Well nourished, well developed, in no acute distress  Neck: no JVD, carotid bruits, or masses Cardiac:RRR; no murmurs, rubs, or gallops  Respiratory:  clear to auscultation bilaterally, normal work of breathing GI: soft, nontender, nondistended, + BS Ext: without cyanosis, clubbing, or edema, Good distal pulses bilaterally Neuro:  Alert and Oriented x 3 Psych: euthymic mood, full affect  Wt Readings from Last 3 Encounters:  02/18/18  174 lb 6.4 oz (79.1 kg)  10/14/17 179 lb (81.2 kg)  01/30/17 164 lb (74.4 kg)      Studies/Labs Reviewed:   EKG:  EKG is  ordered today.  The ekg ordered today demonstrates sinus bradycardia at 58 bpm otherwise normal  Recent Labs: 08/05/2017: ALT 21; BUN 10; Creatinine, Ser 0.86; Hemoglobin 14.1; Platelets 154; Potassium 3.5; Sodium 142   Lipid Panel    Component Value Date/Time   CHOL 205 (H) 12/27/2014 1055   TRIG 129 12/27/2014 1055   HDL 60 12/27/2014 1055   CHOLHDL 3.4 12/27/2014 1055   VLDL 26 12/27/2014 1055   LDLCALC 119 12/27/2014 1055    Additional studies/ records that were reviewed today include:  Holter monitor 8/28/2019Study Highlights    Normal sinus rhythm, sinus bradycardia and sinus tachycardia with average heart rate 73 bpm. Heart rate ranged from 42 to 185 bpm.  Occasional PVCs with PVC burden 1%.  Nonsustained atrial tachycardia up to 13 beats.         ASSESSMENT:    1. SVT (supraventricular tachycardia) ([facility])   2. Benign hypertensive heart disease without heart failure      PLAN:  In order of problems listed above:  SVT with recurrence on recent Holter monitor.  Metoprolol increase.  Also had some bradycardia in the 40s.  Patient dizzy on the higher dose metoprolol.  Will reduce to Toprol 50 mg once daily.  Follow-up with Dr. Curt Bears to discuss possible ablation  Essential  hypertension blood pressure stable.    Medication Adjustments/Labs and Tests Ordered: Current medicines are reviewed at length with the patient today.  Concerns regarding medicines are outlined above.  Medication changes, Labs and Tests ordered today are listed in the Patient Instructions below. Patient Instructions  Medication Instructions:  DECREASE: Metoprolol to 50 MG daily  If you need a refill on your cardiac medications before your next appointment, please call your pharmacy.   Lab work: None  If you have labs (blood work) drawn today and your tests are completely normal, you will receive your results only by: [name] MyChart Message (if you have MyChart) OR . A paper copy in the mail If you have any lab test that is abnormal or we need to change your treatment, we will call you to review the results.  Testing/Procedures: None  Follow-Up: Please schedule a appointment with Dr. Shonna Chock to discuss SVT  Any Other Special Instructions Will Be Listed Below (If Applicable).       Signed, Ermalinda Barrios, PA-C  02/18/2018 12:52 PM    [facility] [place], [place], [place]  [address] Phone: [phone]; Fax: [phone]

## 2018-02-23 ENCOUNTER — Telehealth: Payer: Self-pay | Admitting: Cardiology

## 2018-02-23 NOTE — Telephone Encounter (Signed)
Patient returned call

## 2018-02-23 NOTE — Telephone Encounter (Signed)
° °  Patient calling with concerns regarding HR and o2. Patient wants to know if she needs any testing done prior to appt on Thursday  Patient states over the weekend EMS was called. HR 148  BP 160/91 88-->94% o2

## 2018-02-25 ENCOUNTER — Other Ambulatory Visit: Payer: Self-pay | Admitting: Cardiology

## 2018-02-25 ENCOUNTER — Encounter: Payer: Self-pay | Admitting: *Deleted

## 2018-02-25 DIAGNOSIS — IMO0001 Reserved for inherently not codable concepts without codable children: Secondary | ICD-10-CM | POA: Insufficient documentation

## 2018-02-25 DIAGNOSIS — R1011 Right upper quadrant pain: Secondary | ICD-10-CM | POA: Insufficient documentation

## 2018-02-25 DIAGNOSIS — K5900 Constipation, unspecified: Secondary | ICD-10-CM | POA: Insufficient documentation

## 2018-02-25 DIAGNOSIS — R9389 Abnormal findings on diagnostic imaging of other specified body structures: Secondary | ICD-10-CM | POA: Insufficient documentation

## 2018-02-25 DIAGNOSIS — M545 Low back pain, unspecified: Secondary | ICD-10-CM | POA: Insufficient documentation

## 2018-02-25 DIAGNOSIS — R935 Abnormal findings on diagnostic imaging of other abdominal regions, including retroperitoneum: Secondary | ICD-10-CM | POA: Insufficient documentation

## 2018-02-25 DIAGNOSIS — J309 Allergic rhinitis, unspecified: Secondary | ICD-10-CM | POA: Insufficient documentation

## 2018-02-26 ENCOUNTER — Encounter: Payer: Self-pay | Admitting: Cardiology

## 2018-02-26 ENCOUNTER — Ambulatory Visit: Payer: Medicare Other | Admitting: Cardiology

## 2018-02-26 VITALS — BP 126/70 | HR 65 | Ht 63.0 in | Wt 174.0 lb

## 2018-02-26 DIAGNOSIS — I471 Supraventricular tachycardia: Secondary | ICD-10-CM | POA: Diagnosis not present

## 2018-02-26 DIAGNOSIS — I10 Essential (primary) hypertension: Secondary | ICD-10-CM

## 2018-02-26 MED ORDER — DILTIAZEM HCL ER COATED BEADS 240 MG PO CP24: 240.0000 mg | ORAL_CAPSULE | Freq: Every day | ORAL | 3 refills | Status: DC

## 2018-02-26 NOTE — Patient Instructions (Signed)
Medication Instructions:  Your physician has recommended you make the following change in your medication: 1. STOP Toprol 2. START Diltiazem 240 mg once daily  If you need a refill on your cardiac medications before your next appointment, please call your pharmacy.   Lab work: None ordered  Testing/Procedures: None ordered  Follow-Up: At [facility], you and your health needs are our priority.  As part of our continuing mission to provide you with exceptional heart care, we have created designated Provider Care Teams.  These Care Teams include your primary Cardiologist (physician) and Advanced Practice Providers (APPs -  Physician Assistants and Nurse Practitioners) who all work together to provide you with the care you need, when you need it. You will need a follow up appointment in 3 months.  Please call our office 2 months in advance to schedule this appointment.  You may see Will Meredith Leeds, MD or one of the following Advanced Practice Providers on your designated Care Team:   Chanetta Marshall, NP . Tommye Standard, PA-C  Thank you for choosing [facility]!!   Trinidad Curet, RN [phone]  Any Other Special Instructions Will Be Listed Below (If Applicable). Diltiazem tablets What is this medicine? DILTIAZEM (dil TYE a zem) is a calcium-channel blocker. It affects the amount of calcium found in your heart and muscle cells. This relaxes your blood vessels, which can reduce the amount of work the heart has to do. This medicine is used to treat chest pain caused by angina. This medicine may be used for other purposes; ask your health care provider or pharmacist if you have questions. COMMON BRAND NAME(S): Cardizem What should I tell my health care provider before I take this medicine? They need to know if you have any of these conditions: -heart problems, low blood pressure, irregular heartbeat -liver disease -previous heart attack -an unusual or allergic reaction to  diltiazem, other medicines, foods, dyes, or preservatives -pregnant or trying to get pregnant -breast-feeding How should I use this medicine? Take this medicine by mouth with a glass of water. Follow the directions on the prescription label. Do not cut, crush or chew this medicine. This medicine is usually taken before meals and at bedtime. Take your doses at regular intervals. Do not take your medicine more often then directed. Do not stop taking except on the advice of your doctor or health care professional. Talk to your pediatrician regarding the use of this medicine in children. Special care may be needed. Overdosage: If you think you have taken too much of this medicine contact a poison control center or emergency room at once. NOTE: This medicine is only for you. Do not share this medicine with others. What if I miss a dose? If you miss a dose, take it as soon as you can. If it is almost time for your next dose, take only that dose. Do not take double or extra doses. What may interact with this medicine? Do not take this medicine with any of the following: -cisapride -hawthorn -pimozide -ranolazine -red yeast rice This medicine may also interact with the following medications: -buspirone -carbamazepine -cimetidine -cyclosporine -digoxin -local anesthetics or general anesthetics -lovastatin -medicines for anxiety or difficulty sleeping like midazolam and triazolam -medicines for high blood pressure or heart problems -quinidine -rifampin, rifabutin, or rifapentine This list may not describe all possible interactions. Give your health care provider a list of all the medicines, herbs, non-prescription drugs, or dietary supplements you use. Also tell them if you smoke, drink  alcohol, or use illegal drugs. Some items may interact with your medicine. What should I watch for while using this medicine? Check your blood pressure and pulse rate regularly. Ask your doctor or health care  professional what your blood pressure and pulse rate should be and when you should contact him or her. You may feel dizzy or lightheaded. Do not drive, use machinery, or do anything that needs mental alertness until you know how this medicine affects you. To reduce the risk of dizzy or fainting spells, do not sit or stand up quickly, especially if you are an older patient. Alcohol can make you more dizzy or increase flushing and rapid heartbeats. Avoid alcoholic drinks. What side effects may I notice from receiving this medicine? Side effects that you should report to your doctor or health care professional as soon as possible: -allergic reactions like skin rash, itching or hives, swelling of the face, lips, or tongue -confusion, mental depression -feeling faint or lightheaded, falls -pinpoint red spots on the skin -redness, blistering, peeling or loosening of the skin, including inside the mouth -slow, irregular heartbeat -swelling of the ankles, feet -unusual bleeding or bruising Side effects that usually do not require medical attention (report to your doctor or health care professional if they continue or are bothersome): -change in sex drive or performance -constipation or diarrhea -flushing of the face -headache -nausea, vomiting -tired or weak -trouble sleeping This list may not describe all possible side effects. Call your doctor for medical advice about side effects. You may report side effects to FDA at 1-800-FDA-1088. Where should I keep my medicine? Keep out of the reach of children. Store at room temperature between 20 and 25 degrees C (68 and 77 degrees F). Protect from light. Keep container tightly closed. Throw away any unused medicine after the expiration date. NOTE: This sheet is a summary. It may not cover all possible information. If you have questions about this medicine, talk to your doctor, pharmacist, or health care provider.  2018 Elsevier/Gold Standard (2013-03-22  10:54:31)

## 2018-02-26 NOTE — Progress Notes (Signed)
Electrophysiology Office Note   Date:  02/26/2018   ID:  Berdella Bacot, DOB 29-Apr-1950, MRN [id number]  PCP:  Cari Caraway, MD  Cardiologist:  [name] Primary Electrophysiologist:  Willaim Mode Meredith Leeds, MD    No chief complaint on file.    History of Present Illness: Daisi Kentner is a 67 y.o. female who presents today for electrophysiology evaluation.   Hx essential hypertension, HLD and a history of palpitations and tachycardia.  She has had a long history of paroxysmal supraventricular tachycardia. She states that she had an ablation for SVT at [facility] in 2001. Since then she has had infrequent episodes of SVT. Many of her episodes have occurred following surgical procedures. She has had an echocardiogram on 02/02/13 which showed normal left ventricular function with ejection fraction 55-60% and no significant abnormalities. She also has a history of Hashimoto's thyroiditis which is followed by her PCP.   She apparently has delayed SVT after she gets anesthesia. This occurs with both conscious sedation and with general anesthesia.  This can occur from a few hours to 3 days after the general anesthesia or conscious sedation as happened with her colonoscopy.   Today, denies symptoms of palpitations, chest pain, shortness of breath, orthopnea, PND, lower extremity edema, claudication, dizziness, presyncope, syncope, bleeding, or neurologic sequela. The patient is tolerating medications without difficulties.  Doing well today.  She has had 2 prior episodes of SVT.  The most recent was this past Sunday.  She had an episode in August as well.  She wore a cardiac monitor that showed no evidence of SVT.  Her symptoms are of palpitations and shortness of breath.  Her episodes were short-lived.   Past Medical History:  Diagnosis Date  . Arthritis    Neck  . Complication of anesthesia    SVT  . Dysplasia of cervix, low grade (CIN 1)    LEEP cervical conization  September 2017 margins negative CIN-1  . GERD (gastroesophageal reflux disease)   . Hyperlipidemia   . Hypothyroidism   . Osteopenia 10/2017   T score -1.9 FRAX 8.8% / 0.7%.  Stable from prior DEXA.  [name] SVT (supraventricular tachycardia) (HCC)    Past Surgical History:  Procedure Laterality Date  . BREAST SURGERY  1976   CYST REMOVED  . CESAREAN SECTION  1987  . HYSTEROSCOPY  9.14.2012   w/myomectomy  . left thumb surgery and Carpal Tunnel Release  9/14     Current Outpatient Medications  Medication Sig Dispense Refill  . bacitracin ophthalmic ointment Place 1 application into both eyes as needed.    . Cholecalciferol (VITAMIN D-3) 5000 units TABS Take 1 tablet by mouth daily.    . cycloSPORINE (RESTASIS) 0.05 % ophthalmic emulsion Place 1 drop into both eyes daily.    . fish oil-omega-3 fatty acids 1000 MG capsule Take 1 g by mouth daily.    . fluticasone (FLONASE) 50 MCG/ACT nasal spray Place 1 spray into both nostrils as needed.    [name] levothyroxine (SYNTHROID, LEVOTHROID) 75 MCG tablet Take 75 mcg by mouth daily before breakfast.    . metoprolol succinate (TOPROL-XL) 50 MG 24 hr tablet Take 1 tablet by mouth daily 90 tablet 3  . Multiple Vitamin (MULTIVITAMIN WITH MINERALS) TABS tablet Take 1 tablet by mouth daily.    . polyethylene glycol (MIRALAX / GLYCOLAX) packet Take 17 g by mouth daily as needed for mild constipation.     . Probiotic Product (PROBIOTIC ADVANCED PO) Take 1 capsule by mouth  daily.     . rosuvastatin (CRESTOR) 10 MG tablet Take 10 mg by mouth 3 (three) times a week. Every Monday, Wednesday, and Friday in the evenings.    . vitamin E 400 UNIT capsule Take 1 capsule by mouth daily.     No current facility-administered medications for this visit.     Allergies:   Lipitor [atorvastatin]; Epinephrine; and Lidocaine-epinephrine [lidocaine-epinephrine]   Social History:  The patient  reports that she has never smoked. She has never used smokeless tobacco. She  reports that she drinks alcohol. She reports that she does not use drugs.   Family History:  The patient's family history includes Arthritis in her brother, father, mother, and sister; Colon polyps in her father; Glaucoma in her brother, father, and mother; Heart disease in her father and mother; Hypertension in her brother, father, and mother; Mitral valve prolapse in her brother and mother.    ROS:  Please see the history of present illness.   Otherwise, review of systems is positive for fatigue, palpitations, back pain.   All other systems are reviewed and negative.   PHYSICAL EXAM: VS:  BP 126/70   Pulse 65   Ht 5\' 3"  (1.6 m)   Wt 174 lb (78.9 kg)   LMP 11/26/2000   SpO2 98%   BMI 30.82 kg/m  , BMI Body mass index is 30.82 kg/m. GEN: Well nourished, well developed, in no acute distress  HEENT: normal  Neck: no JVD, carotid bruits, or masses Cardiac: RRR; no murmurs, rubs, or gallops,no edema  Respiratory:  clear to auscultation bilaterally, normal work of breathing GI: soft, nontender, nondistended, + BS MS: no deformity or atrophy  Skin: warm and dry Neuro:  Strength and sensation are intact Psych: euthymic mood, full affect  EKG:  EKG is not ordered today. Personal review of the ekg ordered 02/18/18 shows sinus rhythm, rate 58  Recent Labs: 08/05/2017: ALT 21; BUN 10; Creatinine, Ser 0.86; Hemoglobin 14.1; Platelets 154; Potassium 3.5; Sodium 142    Lipid Panel     Component Value Date/Time   CHOL 205 (H) 12/27/2014 1055   TRIG 129 12/27/2014 1055   HDL 60 12/27/2014 1055   CHOLHDL 3.4 12/27/2014 1055   VLDL 26 12/27/2014 1055   LDLCALC 119 12/27/2014 1055     Wt Readings from Last 3 Encounters:  02/26/18 174 lb (78.9 kg)  02/18/18 174 lb 6.4 oz (79.1 kg)  10/14/17 179 lb (81.2 kg)      Other studies Reviewed: Additional studies/ records that were reviewed today include: TTE 2014  Review of the above records today demonstrates:  Left ventricle: The cavity  size was normal. Systolic function was normal. The estimated ejection fraction was in the range of 55% to 60%. Wall motion was normal; there were no regional wall motion abnormalities.  Personal review of event monitor 2015 shows sinus rhythm with occasional APCs.  Cardiac monitor 01/26/18 - personally reviewed  Normal sinus rhythm, sinus bradycardia and sinus tachycardia with average heart rate 73 bpm. Heart rate ranged from 42 to 185 bpm.  Occasional PVCs with PVC burden 1%.  Nonsustained atrial tachycardia up to 13 beats.    ASSESSMENT AND PLAN:  1.  SVT: Status post ablation in 2001.  She has had more frequent episodes of SVT since that time.  Due to that, we Venetta Knee stop her metoprolol and start her on diltiazem.  Should she continue to have episodes of SVT, Marlan Steward likely plan for ablation.  2. Hypertension: Well-controlled no changes.   Current medicines are reviewed at length with the patient today.   The patient does not have concerns regarding her medicines.  The following changes were made today: Stop metoprolol, start diltiazem  Labs/ tests ordered today include:  No orders of the defined types were placed in this encounter.    Disposition:   FU with Shakeila Pfarr 3 months  Signed, Genelle Economou Meredith Leeds, MD  02/26/2018 4:20 PM     [facility] [place]  [address] [phone] (office) [phone] (fax)

## 2018-02-26 NOTE — Telephone Encounter (Signed)
Pt seen in clinic today.  

## 2018-03-09 ENCOUNTER — Telehealth: Payer: Self-pay | Admitting: Cardiology

## 2018-03-09 NOTE — Telephone Encounter (Signed)
New Message           Patient is asking for a call back to discuss changing the dosage. Pls call and advise.

## 2018-03-09 NOTE — Telephone Encounter (Signed)
Pt reporting dizziness/light-headedness/feeling off since starting the Diltiazem 240.  She is asking if she can try a lower dose.  She is going to return to her Toprol until she hears back from me. Aware that I will discuss w/ Camnitz tomorrow and let her know. She is agreeable to plan.

## 2018-03-10 MED ORDER — DILTIAZEM HCL ER COATED BEADS 180 MG PO CP24: 180.0000 mg | ORAL_CAPSULE | Freq: Every day | ORAL | 1 refills | Status: DC

## 2018-03-10 NOTE — Telephone Encounter (Signed)
Advised Dr. Curt Bears recommends decreasing to 180 mg. Pt aware and agreeable to plan. She will start tomorrow and let us know if issues remain after decrease.

## 2018-03-30 MED ORDER — METOPROLOL SUCCINATE ER 50 MG PO TB24: 50.0000 mg | ORAL_TABLET | Freq: Every day | ORAL | 1 refills | Status: DC

## 2018-03-30 NOTE — Telephone Encounter (Signed)
Advised that Dr. Curt Bears recommends increasing Toprol to 100 mg once daily.  Pt would like to try it at her current dose of 50 mg for the next month and see how she does.  She will call the office if this does not work. Rx sent to [facility]

## 2018-04-04 ENCOUNTER — Other Ambulatory Visit: Payer: Self-pay | Admitting: Cardiology

## 2018-04-10 ENCOUNTER — Telehealth: Payer: Self-pay | Admitting: *Deleted

## 2018-04-10 NOTE — Telephone Encounter (Signed)
   [facility] Pre-operative Risk Assessment    Request for surgical clearance:  1. What type of surgery is being performed? LID ECTROPION REPAIR VIA LATERAL TARSAL STRIP, CONJUNCTIVOPLASTY W/RECONSTRUCTION OF CUL DE SAC W/EXTENSIVE REARRANGEMENT   2. When is this surgery scheduled? TBD   3. What type of clearance is required (medical clearance vs. Pharmacy clearance to hold med vs. Both)? MEDICAL  4. Are there any medications that need to be held prior to surgery and how long?NO ANTICOAGS LISTED   5. Practice name and name of physician performing surgery? [facility]   6. What is your office phone number [phone]    7.   What is your office fax number [phone]  8.   Anesthesia type (None, local, MAC, general) ? MAC   Julaine Hua 04/10/2018, 10:25 AM  _________________________________________________________________   (provider comments below)

## 2018-04-14 NOTE — Telephone Encounter (Signed)
   Primary Cardiologist: Fransico Him, MD  Electrophysiologist: Allegra Lai, MD  Chart reviewed as part of pre-operative protocol coverage. Patient is followed by Dr. Curt Bears for SVT. She also has h/o thyroiditis, HLD, hypothyroidism, arthritis. 2D echo 2014 was normal. No hx of CAD. She was recently seen in evaluation by Dr. 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 Per his report, "This occurs with both conscious sedation and with general anesthesia. This can occur from a few hours to 3 days after the general anesthesia or conscious sedation as happened with her colonoscopy. " He had recommended to stop Toprol and start diltiazem. She had too many side effects with Diltiazem so went back to Toprol. We are now being asked to clear her for another surgery with MAC. I will reach out to Dr. Curt Bears for his general thoughts on clearance, if there is anything else that is necessary for undergoing this procedure given her history of arrhythmia with sedation. Dr. Curt Bears, please route response to [facility] PREOP (the pre-op pool). Thank you.  Charlie Pitter, PA-C 04/14/2018, 1:26 PM

## 2018-04-20 NOTE — Telephone Encounter (Signed)
Low to intermediate risk for an intermediate risk procedure. Would increase toprol XL to 50 mg BID for 3 days after the procedure. No other recommendations.

## 2018-04-20 NOTE — Telephone Encounter (Signed)
   Primary Cardiologist: Fransico Him, MD  Chart reviewed as part of pre-operative protocol coverage. Given past medical history and time since last visit, based on ACC/AHA guidelines, [name] would be at acceptable risk for the planned procedure without further cardiovascular testing.   Her cardiologist suggest the patient increase her Toprol XL 50 mg to BID for 3 days post op, then resume her usual dose.   I will route this recommendation to the requesting party via Epic fax function and remove from pre-op pool.  Please call with questions.  Kerin Ransom, PA-C 04/20/2018, 3:15 PM

## 2018-05-26 ENCOUNTER — Other Ambulatory Visit: Payer: Self-pay

## 2018-05-26 ENCOUNTER — Ambulatory Visit
Admission: EM | Admit: 2018-05-26 | Discharge: 2018-05-26 | Disposition: A | Discharge status: Home / Self Care | Payer: Medicare Other | Attending: Family Medicine | Admitting: Family Medicine

## 2018-05-26 ENCOUNTER — Encounter: Payer: Self-pay | Admitting: Emergency Medicine

## 2018-05-26 DIAGNOSIS — J069 Acute upper respiratory infection, unspecified: Secondary | ICD-10-CM | POA: Diagnosis not present

## 2018-05-26 DIAGNOSIS — B9789 Other viral agents as the cause of diseases classified elsewhere: Secondary | ICD-10-CM

## 2018-05-26 MED ORDER — HYDROCOD POLST-CPM POLST ER 10-8 MG/5ML PO SUER: 5.0000 mL | Freq: Every evening | ORAL | 0 refills | Status: DC | PRN

## 2018-05-26 NOTE — ED Triage Notes (Signed)
Patient c/o dry cough for the past 4 days.  Patient denies fevers.

## 2018-05-26 NOTE — Discharge Instructions (Signed)
Rest. Fluids.  Medication as prescribed.   Take care  Dr. Joanne Salah  

## 2018-05-26 NOTE — ED Provider Notes (Signed)
[facility]    CSN: [id number] Arrival date & time: 05/26/18  1046  History   Chief Complaint Chief Complaint  Patient presents with  . Cough   HPI  68 year old female presents with cough.  3 to 4-day history of cough, congestion, sneezing, hoarseness.  No fever.  She has treated herself symptomatically without resolution.  He does note that her cough is worse at night last night.  No relieving factors.  Mild to moderate in severity.  No other associated symptoms.  No other complaints.  PMH, Surgical Hx, Family Hx, Social History reviewed and updated as below.  Past Medical History:  Diagnosis Date  . Arthritis    Neck  . Complication of anesthesia    SVT  . Dysplasia of cervix, low grade (CIN 1)    LEEP cervical conization September 2017 margins negative CIN-1  . GERD (gastroesophageal reflux disease)   . Hyperlipidemia   . Hypothyroidism   . Osteopenia 10/2017   T score -1.9 FRAX 8.8% / 0.7%.  Stable from prior DEXA.  [name] SVT (supraventricular tachycardia) [facility])     Patient Active Problem List   Diagnosis Date Noted  . Abnormal CT of the abdomen 02/25/2018  . Abdominal pain, right upper quadrant 02/25/2018  . Abnormal finding on radiology exam 02/25/2018  . Allergic rhinitis 02/25/2018  . Brash 02/25/2018  . CN (constipation) 02/25/2018  . LBP (low back pain) 02/25/2018  . Combined form of age-related cataract, both eyes 04/07/2017  . Posterior vitreous detachment of right eye 04/07/2017  . 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 CIN I (cervical intraepithelial neoplasia I) 01/02/2016  . Preoperative cardiovascular examination 11/10/2015  . ASCUS with positive high risk HPV cervical 12/13/2014  . Eyelid retraction 12/02/2014  . Dry eye syndrome of bilateral lacrimal glands 12/01/2014  . 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 Other disorders affecting eyelid function 12/01/2014  . 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 Peripheral visual field  defect of both eyes 12/01/2014  . Age-related nuclear cataract of both eyes 09/06/2014  . Anatomical narrow angle of both eyes 09/06/2014  . Hx of Hashimoto thyroiditis 09/06/2014  . Keratoconjunctivitis sicca of both eyes not specified as Sjogren's 09/06/2014  . Meibomian gland dysfunction (MGD) of upper and lower lids of both eyes 09/06/2014  . Abnormal Papanicolaou smear of cervix with positive human papilloma virus (HPV) test 06/27/2014  . Hypokalemia 03/04/2014  . History of paroxysmal supraventricular tachycardia 02/25/2014  . Osteopenia 04/30/2013  . Dysphonia 04/05/2013  . Laryngopharyngeal reflux 04/05/2013  . Hashimoto's thyroiditis 03/29/2013  . 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 Hypercalcemia 05/25/2012  . Hypothyroidism 04/27/2012  . Hyperlipidemia 04/27/2012  . Paroxysmal supraventricular tachycardia ([place]) 04/27/2012  . Tachycardia 01/22/2012  . Endometrial polyp 12/04/2010  . Fibroid, uterus 11/27/2010  . Acquired hypothyroidism 01/17/2010  . Other and unspecified hyperlipidemia 02/01/2008  . Other specified cardiac dysrhythmias(427.89) 12/07/2007    Past Surgical History:  Procedure Laterality Date  . BREAST SURGERY  1976   CYST REMOVED  . CESAREAN SECTION  1987  . left thumb surgery and Carpal Tunnel Release  9/14    OB History    Gravida  1   Para  1   Term  1   Preterm      AB      Living  1     SAB      TAB      Ectopic  Multiple      Live Births               Home Medications    Prior to Admission medications   Medication Sig Start Date End Date Taking? Authorizing Provider  Cholecalciferol (VITAMIN D-3) 5000 units TABS Take 1 tablet by mouth daily.   Yes [provider]  cycloSPORINE (RESTASIS) 0.05 % ophthalmic emulsion Place 1 drop into both eyes daily.   Yes [provider]  fish oil-omega-3 fatty acids 1000 MG capsule Take 1 g by mouth daily.   Yes [provider]  fluticasone (FLONASE) 50 MCG/ACT nasal spray Place 1 spray into both nostrils as needed. 02/10/18  Yes [provider]  levothyroxine (SYNTHROID, LEVOTHROID) 75 MCG tablet Take 75 mcg by mouth daily before breakfast.   Yes [provider]  metoprolol succinate (TOPROL-XL) 50 MG 24 hr tablet Take 1 tablet (50 mg total) by mouth daily. Take with or immediately following a meal. 03/30/18  Yes Camnitz, Ocie Doyne, MD  Multiple Vitamin (MULTIVITAMIN WITH MINERALS) TABS tablet Take 1 tablet by mouth daily.   Yes [provider]  Probiotic Product (PROBIOTIC ADVANCED PO) Take 1 capsule by mouth daily.    Yes [provider]  rosuvastatin (CRESTOR) 10 MG tablet Take 10 mg by mouth 3 (three) times a week. Every Monday, Wednesday, and Friday in the evenings.   Yes [provider]  vitamin E 400 UNIT capsule Take 1 capsule by mouth daily.   Yes [provider]  bacitracin ophthalmic ointment Place 1 application into both eyes as needed. 12/20/17   [provider]  chlorpheniramine-HYDROcodone (TUSSIONEX PENNKINETIC ER) 10-8 MG/5ML SUER Take 5 mLs by mouth at bedtime as needed. 05/26/18   Coral Spikes, DO  polyethylene glycol (MIRALAX / GLYCOLAX) packet Take 17 g by mouth daily as needed for mild constipation.     [provider]    Family History Family History  Problem Relation Age of Onset  . Hypertension Mother   . Heart disease Mother   . Mitral valve prolapse Mother   . Glaucoma Mother   . Arthritis Mother   . Hypertension Father   . Heart disease Father   . Colon polyps Father   . Glaucoma Father   . Arthritis Father   . Hypertension Brother   . Mitral valve prolapse Brother   . Glaucoma Brother   . Arthritis Brother   . Arthritis Sister     Social History Social History   Tobacco Use  . Smoking status: Never Smoker  . Smokeless tobacco: Never Used  Substance Use Topics  . Alcohol use: Yes     Comment: rare  . Drug use: No     Allergies   Lipitor [atorvastatin]; Diphenhydramine; Epinephrine; and Lidocaine-epinephrine [lidocaine-epinephrine]   Review of Systems Review of Systems  Constitutional: Negative for fever.  HENT: Positive for congestion, sneezing and voice change.   Respiratory: Positive for cough.    Physical Exam Triage Vital Signs ED Triage Vitals  Enc Vitals Group     BP 05/26/18 1124 129/84     Pulse Rate 05/26/18 1124 69     Resp 05/26/18 1124 16     Temp 05/26/18 1124 98.2 F (36.8 C)     Temp Source 05/26/18 1124 Oral     SpO2 05/26/18 1124 100 %     Weight 05/26/18 1120 170 lb (77.1 kg)     Height 05/26/18 1120 5\' 3"  (1.6  m)     Head Circumference --      Peak Flow --      Pain Score 05/26/18 1120 0     Pain Loc --      Pain Edu? --      Excl. in [place]? --    Updated Vital Signs BP 129/84 (BP Location: Left Arm)   Pulse 69   Temp 98.2 F (36.8 C) (Oral)   Resp 16   Ht 5\' 3"  (1.6 m)   Wt 77.1 kg   LMP 11/26/2000   SpO2 100%   BMI 30.11 kg/m   Visual Acuity Right Eye Distance:   Left Eye Distance:   Bilateral Distance:    Right Eye Near:   Left Eye Near:    Bilateral Near:     Physical Exam Vitals signs and nursing note reviewed.  Constitutional:      General: She is not in acute distress.    Appearance: Normal appearance.  HENT:     Head: Normocephalic and atraumatic.     Right Ear: Tympanic membrane normal.     Left Ear: Tympanic membrane normal.     Nose: Nose normal.     Mouth/Throat:     Pharynx: Oropharynx is clear.     Comments: Mild erythema.  Eyes:     General:        Right eye: No discharge.        Left eye: No discharge.     Conjunctiva/sclera: Conjunctivae normal.  Cardiovascular:     Rate and Rhythm: Normal rate and regular rhythm.  Pulmonary:     Effort: Pulmonary effort is normal.     Breath sounds: Normal breath sounds. No wheezing, rhonchi or rales.  Neurological:     Mental Status: She is alert.    Psychiatric:        Mood and Affect: Mood normal.        Behavior: Behavior normal.    UC Treatments / Results  Labs (all labs ordered are listed, but only abnormal results are displayed) Labs Reviewed - No data to display  EKG None  Radiology No results found.  Procedures Procedures (including critical care time)  Medications Ordered in UC Medications - No data to display  Initial Impression / Assessment and Plan / UC Course  I have reviewed the triage vital signs and the nursing notes.  Pertinent labs & imaging results that were available during my care of the patient were reviewed by me and considered in my medical decision making (see chart for details).    68 year old female presents with a viral URI with cough.  Tussionex as needed.  Final Clinical Impressions(s) / UC Diagnoses   Final diagnoses:  Viral URI with cough     Discharge Instructions     Rest. Fluids.  Medication as prescribed.  Take care  Dr. Lacinda Axon    ED Prescriptions    Medication Sig Dispense Auth. Provider   chlorpheniramine-HYDROcodone (TUSSIONEX PENNKINETIC ER) 10-8 MG/5ML SUER Take 5 mLs by mouth at bedtime as needed. 60 mL Coral Spikes, DO     Controlled Substance Prescriptions [place] Controlled Substance Registry consulted? Not Applicable   Coral Spikes, DO 05/26/18 1411

## 2018-05-27 ENCOUNTER — Ambulatory Visit: Payer: Medicare Other | Admitting: Cardiology

## 2018-06-05 ENCOUNTER — Ambulatory Visit: Payer: Medicare Other | Admitting: Cardiology

## 2018-06-05 ENCOUNTER — Encounter: Payer: Self-pay | Admitting: Cardiology

## 2018-06-05 VITALS — BP 122/78 | HR 70 | Ht 63.0 in | Wt 176.6 lb

## 2018-06-05 DIAGNOSIS — I471 Supraventricular tachycardia: Secondary | ICD-10-CM | POA: Diagnosis not present

## 2018-06-05 NOTE — Progress Notes (Signed)
Electrophysiology Office Note   Date:  06/05/2018   ID:  [name], DOB 1950-10-01, MRN [id number]  PCP:  Cari Caraway, MD  Cardiologist:  [name] Primary Electrophysiologist:  Gayleen Sholtz Meredith Leeds, MD    No chief complaint on file.    History of Present Illness: [name] is a 68 y.o. female who presents today for electrophysiology evaluation.   Hx essential hypertension, HLD and a history of palpitations and tachycardia.  She has had a long history of paroxysmal supraventricular tachycardia. She states that she had an ablation for SVT at [facility] in 2001. Since then she has had infrequent episodes of SVT. Many of her episodes have occurred following surgical procedures. She has had an echocardiogram on 02/02/13 which showed normal left ventricular function with ejection fraction 55-60% and no significant abnormalities. She also has a history of Hashimoto's thyroiditis which is followed by her PCP.   She apparently has delayed SVT after she gets anesthesia. This occurs with both conscious sedation and with general anesthesia.  This can occur from a few hours to 3 days after the general anesthesia or conscious sedation as happened with her colonoscopy.   Today, denies symptoms of palpitations, chest pain, shortness of breath, orthopnea, PND, lower extremity edema, claudication, dizziness, presyncope, syncope, bleeding, or neurologic sequela. The patient is tolerating medications without difficulties.  She has had no further episodes of SVT.  She is felt well since last being seen.  She is tolerating her metoprolol without issues.  Past Medical History:  Diagnosis Date  . Arthritis    Neck  . Complication of anesthesia    SVT  . Dysplasia of cervix, low grade (CIN 1)    LEEP cervical conization September 2017 margins negative CIN-1  . GERD (gastroesophageal reflux disease)   . Hyperlipidemia   . Hypothyroidism   . Osteopenia 10/2017   T score  -1.9 FRAX 8.8% / 0.7%.  Stable from prior DEXA.  [name] SVT (supraventricular tachycardia) ([facility])    Past Surgical History:  Procedure Laterality Date  . BREAST SURGERY  1976   CYST REMOVED  . CESAREAN SECTION  1987  . left thumb surgery and Carpal Tunnel Release  9/14     Current Outpatient Medications  Medication Sig Dispense Refill  . bacitracin ophthalmic ointment Place 1 application into both eyes as needed.    . chlorpheniramine-HYDROcodone (TUSSIONEX PENNKINETIC ER) 10-8 MG/5ML SUER Take 5 mLs by mouth at bedtime as needed. 60 mL 0  . Cholecalciferol (VITAMIN D-3) 5000 units TABS Take 1 tablet by mouth daily.    . cycloSPORINE (RESTASIS) 0.05 % ophthalmic emulsion Place 1 drop into both eyes daily.    . fish oil-omega-3 fatty acids 1000 MG capsule Take 1 g by mouth daily.    . fluticasone (FLONASE) 50 MCG/ACT nasal spray Place 1 spray into both nostrils as needed.    [name] levothyroxine (SYNTHROID, LEVOTHROID) 75 MCG tablet Take 75 mcg by mouth daily before breakfast.    . metoprolol succinate (TOPROL-XL) 50 MG 24 hr tablet Take 1 tablet (50 mg total) by mouth daily. Take with or immediately following a meal. 90 tablet 1  . Multiple Vitamin (MULTIVITAMIN WITH MINERALS) TABS tablet Take 1 tablet by mouth daily.    . polyethylene glycol (MIRALAX / GLYCOLAX) packet Take 17 g by mouth daily as needed for mild constipation.     . Probiotic Product (PROBIOTIC ADVANCED PO) Take 1 capsule by mouth daily.     . rosuvastatin (CRESTOR) 10  MG tablet Take 10 mg by mouth 3 (three) times a week. Every Monday, Wednesday, and Friday in the evenings.    . vitamin E 400 UNIT capsule Take 1 capsule by mouth daily.     No current facility-administered medications for this visit.     Allergies:   Lipitor [atorvastatin]; Diphenhydramine; Epinephrine; and Lidocaine-epinephrine [lidocaine-epinephrine]   Social History:  The patient  reports that she has never smoked. She has never used smokeless tobacco. She  reports current alcohol use. She reports that she does not use drugs.   Family History:  The patient's family history includes Arthritis in her brother, father, mother, and sister; Colon polyps in her father; Glaucoma in her brother, father, and mother; Heart disease in her father and mother; Hypertension in her brother, father, and mother; Mitral valve prolapse in her brother and mother.    ROS:  Please see the history of present illness.   Otherwise, review of systems is positive for abdominal pain, constipation, back pain, muscle pain.   All other systems are reviewed and negative.   PHYSICAL EXAM: VS:  BP 122/78   Pulse 70   Ht 5\' 3"  (1.6 m)   Wt 176 lb 9.6 oz (80.1 kg)   LMP 11/26/2000   SpO2 97%   BMI 31.28 kg/m  , BMI Body mass index is 31.28 kg/m. GEN: Well nourished, well developed, in no acute distress  HEENT: normal  Neck: no JVD, carotid bruits, or masses Cardiac: RRR; no murmurs, rubs, or gallops,no edema  Respiratory:  clear to auscultation bilaterally, normal work of breathing GI: soft, nontender, nondistended, + BS MS: no deformity or atrophy  Skin: warm and dry Neuro:  Strength and sensation are intact Psych: euthymic mood, full affect  EKG:  EKG is not ordered today. Personal review of the ekg ordered 02/18/18 shows SR, rate 58   Recent Labs: 08/05/2017: ALT 21; BUN 10; Creatinine, Ser 0.86; Hemoglobin 14.1; Platelets 154; Potassium 3.5; Sodium 142    Lipid Panel     Component Value Date/Time   CHOL 205 (H) 12/27/2014 1055   TRIG 129 12/27/2014 1055   HDL 60 12/27/2014 1055   CHOLHDL 3.4 12/27/2014 1055   VLDL 26 12/27/2014 1055   LDLCALC 119 12/27/2014 1055     Wt Readings from Last 3 Encounters:  06/05/18 176 lb 9.6 oz (80.1 kg)  05/26/18 170 lb (77.1 kg)  02/26/18 174 lb (78.9 kg)      Other studies Reviewed: Additional studies/ records that were reviewed today include: TTE 2014  Review of the above records today demonstrates:  Left  ventricle: The cavity size was normal. Systolic function was normal. The estimated ejection fraction was in the range of 55% to 60%. Wall motion was normal; there were no regional wall motion abnormalities.  Personal review of event monitor 2015 shows sinus rhythm with occasional APCs.  Cardiac monitor 01/26/18 - personally reviewed  Normal sinus rhythm, sinus bradycardia and sinus tachycardia with average heart rate 73 bpm. Heart rate ranged from 42 to 185 bpm.  Occasional PVCs with PVC burden 1%.  Nonsustained atrial tachycardia up to 13 beats.    ASSESSMENT AND PLAN:  1.  SVT: This post ablation in 2001.  Her metoprolol was increased at the last visit.  She has had no further episodes.  No changes.  2. Hypertension: Well-controlled today   Current medicines are reviewed at length with the patient today.   The patient does not have concerns regarding her medicines.  The following changes were made today: None  Labs/ tests ordered today include:  No orders of the defined types were placed in this encounter.    Disposition:   FU with Ruqayyah Lute 12 months  Signed, Nekeshia Lenhardt Meredith Leeds, MD  06/05/2018 10:14 AM     [facility] [address] [place] [address] [phone] (office) [phone] (fax)

## 2018-06-05 NOTE — Patient Instructions (Addendum)
Medication Instructions:  Your physician recommends that you continue on your current medications as directed. Please refer to the Current Medication list given to you today.  * If you need a refill on your cardiac medications before your next appointment, please call your pharmacy.   Labwork: None ordered  Testing/Procedures: None ordered  Follow-Up: Your physician wants you to follow-up in: 1 year with Dr. Camnitz.  You will receive a reminder letter in the mail two months in advance. If you don't receive a letter, please call our office to schedule the follow-up appointment.  *Please note that any paperwork needing to be filled out by the provider will need to be addressed at the front desk prior to seeing the provider. Please note that any FMLA, disability or other documents regarding health condition is subject to a $25.00 charge that must be received prior to completion of paperwork in the form of a money order or check.  Thank you for choosing [facility]!!   Nakenya Theall, RN [phone]      

## 2018-06-13 ENCOUNTER — Other Ambulatory Visit: Payer: Self-pay

## 2018-06-13 ENCOUNTER — Encounter: Payer: Self-pay | Admitting: Emergency Medicine

## 2018-06-13 ENCOUNTER — Emergency Department
Admission: EM | Admit: 2018-06-13 | Discharge: 2018-06-13 | Disposition: A | Discharge status: Home / Self Care | Payer: Medicare Other | Attending: Emergency Medicine | Admitting: Emergency Medicine

## 2018-06-13 ENCOUNTER — Ambulatory Visit (HOSPITAL_COMMUNITY)
Admission: EM | Admit: 2018-06-13 | Discharge: 2018-06-13 | Disposition: A | Discharge status: Home / Self Care | Payer: Medicare Other | Attending: Family Medicine | Admitting: Family Medicine

## 2018-06-13 ENCOUNTER — Encounter (HOSPITAL_COMMUNITY): Payer: Self-pay | Admitting: Emergency Medicine

## 2018-06-13 DIAGNOSIS — K921 Melena: Secondary | ICD-10-CM | POA: Diagnosis not present

## 2018-06-13 DIAGNOSIS — K625 Hemorrhage of anus and rectum: Secondary | ICD-10-CM

## 2018-06-13 DIAGNOSIS — Z5321 Procedure and treatment not carried out due to patient leaving prior to being seen by health care provider: Secondary | ICD-10-CM | POA: Insufficient documentation

## 2018-06-13 LAB — CBC
HCT: 39.9 % (ref 36.0–46.0)
Hemoglobin: 13.9 g/dL (ref 12.0–15.0)
MCH: 28.4 pg (ref 26.0–34.0)
MCHC: 34.8 g/dL (ref 30.0–36.0)
MCV: 81.6 fL (ref 80.0–100.0)
Platelets: 167 10*3/uL (ref 150–400)
RBC: 4.89 MIL/uL (ref 3.87–5.11)
RDW: 14.8 % (ref 11.5–15.5)
WBC: 6.8 10*3/uL (ref 4.0–10.5)
nRBC: 0 % (ref 0.0–0.2)

## 2018-06-13 LAB — COMPREHENSIVE METABOLIC PANEL
ALT: 23 U/L (ref 0–44)
AST: 20 U/L (ref 15–41)
Albumin: 4.6 g/dL (ref 3.5–5.0)
Alkaline Phosphatase: 68 U/L (ref 38–126)
Anion gap: 7 (ref 5–15)
BUN: 20 mg/dL (ref 8–23)
CO2: 26 mmol/L (ref 22–32)
Calcium: 10 mg/dL (ref 8.9–10.3)
Chloride: 107 mmol/L (ref 98–111)
Creatinine, Ser: 0.71 mg/dL (ref 0.44–1.00)
GFR calc Af Amer: >60 mL/min (ref >60)
GFR calc non Af Amer: >60 mL/min (ref >60)
Glucose, Bld: 99 mg/dL (ref 70–99)
Potassium: 4.2 mmol/L (ref 3.5–5.1)
Sodium: 140 mmol/L (ref 135–145)
Total Bilirubin: 0.5 mg/dL (ref 0.3–1.2)
Total Protein: 7.7 g/dL (ref 6.5–8.1)

## 2018-06-13 MED ORDER — HYDROCORTISONE ACETATE 25 MG RE SUPP: 25.0000 mg | Freq: Two times a day (BID) | RECTAL | 0 refills | Status: DC | PRN

## 2018-06-13 NOTE — ED Triage Notes (Signed)
Pt sts some red bleeding from rectum; pt sts thinks is hemorrhoids; pt LWBS from [facility] today

## 2018-06-13 NOTE — ED Provider Notes (Signed)
[place]    CSN: [id number] Arrival date & time: 06/13/18  1616     History   Chief Complaint Chief Complaint  Patient presents with  . Hemorrhoids    HPI [name] is a 68 y.o. female.   [name] presents with complaints of noticing bright red blood today in her stool, and following her second BM this am noticed BRB on the toilet paper. No further bleeding. No abdominal pain or fevers. No nausea or vomiting. Hx of constipation, uses miralax PRN. Has felt bloated and more gas recently. Normal urination. No dizziness or weakness. Has had colonoscopy, but was last approximately 9-10 years ago. States she cannot tolerate the anesthesia used so has had Ct/mri/barrium exams instead. [facility]. No family history of colon cancer. Doesn't smoke. Hx of arthritis, gerd, hypothyroidism, osteopenia.     ROS per HPI.      Past Medical History:  Diagnosis Date  . Arthritis    Neck  . Complication of anesthesia    SVT  . Dysplasia of cervix, low grade (CIN 1)    LEEP cervical conization September 2017 margins negative CIN-1  . GERD (gastroesophageal reflux disease)   . Hyperlipidemia   . Hypothyroidism   . Osteopenia 10/2017   T score -1.9 FRAX 8.8% / 0.7%.  Stable from prior DEXA.  [name] SVT (supraventricular tachycardia) [facility])     Patient Active Problem List   Diagnosis Date Noted  . Abnormal CT of the abdomen 02/25/2018  . Abdominal pain, right upper quadrant 02/25/2018  . Abnormal finding on radiology exam 02/25/2018  . Allergic rhinitis 02/25/2018  . Brash 02/25/2018  . CN (constipation) 02/25/2018  . LBP (low back pain) 02/25/2018  . Combined form of age-related cataract, both eyes 04/07/2017  . Posterior vitreous detachment of right eye 04/07/2017  . 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 CIN I (cervical intraepithelial neoplasia I) 01/02/2016  . Preoperative cardiovascular examination 11/10/2015  . ASCUS with positive high risk HPV  cervical 12/13/2014  . Eyelid retraction 12/02/2014  . Dry eye syndrome of bilateral lacrimal glands 12/01/2014  . 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 Other disorders affecting eyelid function 12/01/2014  . 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 Peripheral visual field defect of both eyes 12/01/2014  . Age-related nuclear cataract of both eyes 09/06/2014  . Anatomical narrow angle of both eyes 09/06/2014  . Hx of Hashimoto thyroiditis 09/06/2014  . Keratoconjunctivitis sicca of both eyes not specified as Sjogren's 09/06/2014  . Meibomian gland dysfunction (MGD) of upper and lower lids of both eyes 09/06/2014  . Abnormal Papanicolaou smear of cervix with positive human papilloma virus (HPV) test 06/27/2014  . Hypokalemia 03/04/2014  . History of paroxysmal supraventricular tachycardia 02/25/2014  . Osteopenia 04/30/2013  . Dysphonia 04/05/2013  . Laryngopharyngeal reflux 04/05/2013  . Hashimoto's thyroiditis 03/29/2013  . 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 Hypercalcemia 05/25/2012  . Hypothyroidism 04/27/2012  . Hyperlipidemia 04/27/2012  . Paroxysmal supraventricular tachycardia ([place]) 04/27/2012  . Tachycardia 01/22/2012  . Endometrial polyp 12/04/2010  . Fibroid, uterus 11/27/2010  . Acquired hypothyroidism 01/17/2010  . Other and unspecified hyperlipidemia 02/01/2008  . Other specified cardiac dysrhythmias(427.89) 12/07/2007    Past Surgical History:  Procedure Laterality Date  . BREAST SURGERY  1976   CYST REMOVED  . CESAREAN SECTION  1987  . left thumb surgery and Carpal Tunnel Release  9/14    OB History    Gravida  1   Para  1   Term  1   Preterm      AB      Living  1     SAB      TAB      Ectopic      Multiple      Live Births               Home Medications    Prior to Admission medications   Medication Sig Start Date End Date Taking? Authorizing Provider  bacitracin ophthalmic ointment  Place 1 application into both eyes as needed. 12/20/17   [provider]  chlorpheniramine-HYDROcodone (TUSSIONEX PENNKINETIC ER) 10-8 MG/5ML SUER Take 5 mLs by mouth at bedtime as needed. 05/26/18   Coral Spikes, DO  Cholecalciferol (VITAMIN D-3) 5000 units TABS Take 1 tablet by mouth daily.    [provider]  cycloSPORINE (RESTASIS) 0.05 % ophthalmic emulsion Place 1 drop into both eyes daily.    [provider]  fish oil-omega-3 fatty acids 1000 MG capsule Take 1 g by mouth daily.    [provider]  fluticasone (FLONASE) 50 MCG/ACT nasal spray Place 1 spray into both nostrils as needed. 02/10/18   [provider]  hydrocortisone (ANUSOL-HC) 25 MG suppository Place 1 suppository (25 mg total) rectally 2 (two) times daily as needed for hemorrhoids or anal itching. 06/13/18   Zigmund Gottron, NP  levothyroxine (SYNTHROID, LEVOTHROID) 75 MCG tablet Take 75 mcg by mouth daily before breakfast.    [provider]  metoprolol succinate (TOPROL-XL) 50 MG 24 hr tablet Take 1 tablet (50 mg total) by mouth daily. Take with or immediately following a meal. 03/30/18   Camnitz, Ocie Doyne, MD  Multiple Vitamin (MULTIVITAMIN WITH MINERALS) TABS tablet Take 1 tablet by mouth daily.    [provider]  polyethylene glycol (MIRALAX / GLYCOLAX) packet Take 17 g by mouth daily as needed for mild constipation.     [provider]  Probiotic Product (PROBIOTIC ADVANCED PO) Take 1 capsule by mouth daily.     [provider]  rosuvastatin (CRESTOR) 10 MG tablet Take 10 mg by mouth 3 (three) times a week. Every Monday, Wednesday, and Friday in the evenings.    [provider]  vitamin E 400 UNIT capsule Take 1 capsule by mouth daily.    [provider]    Family History Family History  Problem Relation Age of Onset  . Hypertension Mother   . Heart disease Mother   . Mitral valve prolapse Mother   . Glaucoma Mother   .  Arthritis Mother   . Hypertension Father   . Heart disease Father   . Colon polyps Father   . Glaucoma Father   . Arthritis Father   . Hypertension Brother   . Mitral valve prolapse Brother   . Glaucoma Brother   . Arthritis Brother   . Arthritis Sister     Social History Social History   Tobacco Use  . Smoking status: Never Smoker  . Smokeless tobacco: Never Used  Substance Use Topics  . Alcohol use: Yes    Comment: rare  . Drug use: No     Allergies   Lipitor [atorvastatin]; Diphenhydramine; and Epinephrine   Review of Systems Review of Systems   Physical Exam Triage Vital Signs ED Triage Vitals  Enc Vitals Group     BP 06/13/18 1638 122/85     Pulse Rate 06/13/18 1638 63  Resp 06/13/18 1638 18     Temp 06/13/18 1638 97.9 F (36.6 C)     Temp Source 06/13/18 1638 Temporal     SpO2 06/13/18 1638 100 %     Weight --      Height --      Head Circumference --      Peak Flow --      Pain Score 06/13/18 1639 3     Pain Loc --      Pain Edu? --      Excl. in [place]? --    No data found.  Updated Vital Signs BP 122/85 (BP Location: Right Arm)   Pulse 63   Temp 97.9 F (36.6 C) (Temporal)   Resp 18   LMP 11/26/2000   SpO2 100%    Physical Exam Constitutional:      General: She is not in acute distress.    Appearance: She is well-developed.  Cardiovascular:     Rate and Rhythm: Normal rate and regular rhythm.     Heart sounds: Normal heart sounds.  Pulmonary:     Effort: Pulmonary effort is normal.     Breath sounds: Normal breath sounds.  Abdominal:     General: Abdomen is flat. There is no distension.  Genitourinary:    Rectum: No mass.     Comments: No frank blood with rectal exam, minimal pain or tenderness; no palpable abnormality; no visible fissure  Skin:    General: Skin is warm and dry.  Neurological:     Mental Status: She is alert and oriented to person, place, and time.      UC Treatments / Results  Labs (all labs ordered  are listed, but only abnormal results are displayed) Labs Reviewed - No data to display  EKG None  Radiology No results found.  Procedures Procedures (including critical care time)  Medications Ordered in UC Medications - No data to display  Initial Impression / Assessment and Plan / UC Course  I have reviewed the triage vital signs and the nursing notes.  Pertinent labs & imaging results that were available during my care of the patient were reviewed by me and considered in my medical decision making (see chart for details).     No pain. Vitals stable. Small amount of bright red blood with passage of stool today. No dizziness. No further bleeding. History of constipation. Internal hemorrhoids vs fissure discussed. Encouraged stool softeners and dietary changes to promote regular bowel movements. Return precautions provided. Patient verbalized understanding and agreeable to plan.  Ambulatory out of clinic without difficulty.    Final Clinical Impressions(s) / UC Diagnoses   Final diagnoses:  Rectal bleeding     Discharge Instructions     Hemorrhoids vs fissure likely causing the blood you saw today.  Use of miralax to prevent straining with bowel movements. Use of suppositories can help with bleeding as well.  Increase fiber and fluids in diet.  Follow up with GI for further evaluation and treatment if persistent.  If worsening of bleeding, abdominal pain, dizziness please go to the ER.    ED Prescriptions    Medication Sig Dispense Auth. Provider   hydrocortisone (ANUSOL-HC) 25 MG suppository Place 1 suppository (25 mg total) rectally 2 (two) times daily as needed for hemorrhoids or anal itching. 12 suppository Zigmund Gottron, NP     Controlled Substance Prescriptions [place] Controlled Substance Registry consulted? Not Applicable   Zigmund Gottron, NP 06/13/18 [phone]

## 2018-06-13 NOTE — Discharge Instructions (Signed)
Hemorrhoids vs fissure likely causing the blood you saw today.  Use of miralax to prevent straining with bowel movements. Use of suppositories can help with bleeding as well.  Increase fiber and fluids in diet.  Follow up with GI for further evaluation and treatment if persistent.  If worsening of bleeding, abdominal pain, dizziness please go to the ER.

## 2018-06-13 NOTE — ED Triage Notes (Signed)
First Nurse Note:  C/O rectal bleeding.  Noticed symptoms today.  One episode reported of bright red blood with BM.  AAOx3.  Skin warm and dry. NAD

## 2018-06-13 NOTE — ED Triage Notes (Signed)
Bright red blood with stool x 3 today.

## 2018-07-06 ENCOUNTER — Other Ambulatory Visit: Payer: Self-pay | Admitting: Physician Assistant

## 2018-07-06 DIAGNOSIS — K625 Hemorrhage of anus and rectum: Secondary | ICD-10-CM

## 2018-07-20 ENCOUNTER — Other Ambulatory Visit: Payer: Self-pay | Admitting: Physician Assistant

## 2018-07-20 DIAGNOSIS — R1011 Right upper quadrant pain: Secondary | ICD-10-CM

## 2018-07-20 DIAGNOSIS — R110 Nausea: Secondary | ICD-10-CM

## 2018-07-23 ENCOUNTER — Ambulatory Visit: Payer: Medicare Other

## 2018-07-31 ENCOUNTER — Ambulatory Visit
Admission: RE | Admit: 2018-07-31 | Discharge: 2018-07-31 | Disposition: A | Discharge status: Home / Self Care | Payer: Medicare Other | Source: Ambulatory Visit | Attending: Physician Assistant | Admitting: Physician Assistant

## 2018-07-31 DIAGNOSIS — R110 Nausea: Secondary | ICD-10-CM

## 2018-07-31 DIAGNOSIS — R1011 Right upper quadrant pain: Secondary | ICD-10-CM

## 2018-09-07 ENCOUNTER — Ambulatory Visit: Payer: Medicare Other

## 2018-09-21 ENCOUNTER — Ambulatory Visit
Admission: RE | Admit: 2018-09-21 | Discharge: 2018-09-21 | Disposition: A | Discharge status: Home / Self Care | Payer: Medicare Other | Source: Ambulatory Visit | Attending: Physician Assistant | Admitting: Physician Assistant

## 2018-09-21 DIAGNOSIS — K625 Hemorrhage of anus and rectum: Secondary | ICD-10-CM

## 2018-09-21 HISTORY — PX: OTHER SURGICAL HISTORY: SHX169

## 2018-09-29 ENCOUNTER — Other Ambulatory Visit: Payer: Self-pay | Admitting: Cardiology

## 2018-09-29 MED ORDER — METOPROLOL SUCCINATE ER 50 MG PO TB24: 50.0000 mg | ORAL_TABLET | Freq: Every day | ORAL | 2 refills | Status: DC

## 2018-12-29 ENCOUNTER — Other Ambulatory Visit: Payer: Self-pay

## 2018-12-30 ENCOUNTER — Ambulatory Visit (INDEPENDENT_AMBULATORY_CARE_PROVIDER_SITE_OTHER): Payer: Medicare Other | Admitting: Women's Health

## 2018-12-30 ENCOUNTER — Encounter: Payer: Self-pay | Admitting: Women's Health

## 2018-12-30 VITALS — BP 124/80 | Ht 63.0 in | Wt 172.0 lb

## 2018-12-30 DIAGNOSIS — Z8742 Personal history of other diseases of the female genital tract: Secondary | ICD-10-CM | POA: Diagnosis not present

## 2018-12-30 DIAGNOSIS — Z01419 Encounter for gynecological examination (general) (routine) without abnormal findings: Secondary | ICD-10-CM | POA: Diagnosis not present

## 2018-12-30 NOTE — Patient Instructions (Addendum)
Vit d 2000 iu daily  Good to see you  Health Maintenance After Age 67 After age 67, you are at a higher risk for certain long-term diseases and infections as well as injuries from falls. Falls are a major cause of broken bones and head injuries in people who are older than age 67. Getting regular preventive care can help to keep you healthy and well. Preventive care includes getting regular testing and making lifestyle changes as recommended by your health care provider. Talk with your health care provider about:  Which screenings and tests you should have. A screening is a test that checks for a disease when you have no symptoms.  A diet and exercise plan that is right for you. What should I know about screenings and tests to prevent falls? Screening and testing are the best ways to find a health problem early. Early diagnosis and treatment give you the best chance of managing medical conditions that are common after age 67. Certain conditions and lifestyle choices may make you more likely to have a fall. Your health care provider may recommend:  Regular vision checks. Poor vision and conditions such as cataracts can make you more likely to have a fall. If you wear glasses, make sure to get your prescription updated if your vision changes.  Medicine review. Work with your health care provider to regularly review all of the medicines you are taking, including over-the-counter medicines. Ask your health care provider about any side effects that may make you more likely to have a fall. Tell your health care provider if any medicines that you take make you feel dizzy or sleepy.  Osteoporosis screening. Osteoporosis is a condition that causes the bones to get weaker. This can make the bones weak and cause them to break more easily.  Blood pressure screening. Blood pressure changes and medicines to control blood pressure can make you feel dizzy.  Strength and balance checks. Your health care provider  may recommend certain tests to check your strength and balance while standing, walking, or changing positions.  Foot health exam. Foot pain and numbness, as well as not wearing proper footwear, can make you more likely to have a fall.  Depression screening. You may be more likely to have a fall if you have a fear of falling, feel emotionally low, or feel unable to do activities that you used to do.  Alcohol use screening. Using too much alcohol can affect your balance and may make you more likely to have a fall. What actions can I take to lower my risk of falls? General instructions  Talk with your health care provider about your risks for falling. Tell your health care provider if: ? You fall. Be sure to tell your health care provider about all falls, even ones that seem minor. ? You feel dizzy, sleepy, or off-balance.  Take over-the-counter and prescription medicines only as told by your health care provider. These include any supplements.  Eat a healthy diet and maintain a healthy weight. A healthy diet includes low-fat dairy products, low-fat (lean) meats, and fiber from whole grains, beans, and lots of fruits and vegetables. Home safety  Remove any tripping hazards, such as rugs, cords, and clutter.  Install safety equipment such as grab bars in bathrooms and safety rails on stairs.  Keep rooms and walkways well-lit. Activity   Follow a regular exercise program to stay fit. This will help you maintain your balance. Ask your health care provider what types of exercise  are appropriate for you.  If you need a cane or walker, use it as recommended by your health care provider.  Wear supportive shoes that have nonskid soles. Lifestyle  Do not drink alcohol if your health care provider tells you not to drink.  If you drink alcohol, limit how much you have: ? 0-1 drink a day for women. ? 0-2 drinks a day for men.  Be aware of how much alcohol is in your drink. In the [place] one  drink equals one typical bottle of beer (12 oz), one-half glass of wine (5 oz), or one shot of hard liquor (1 oz).  Do not use any products that contain nicotine or tobacco, such as cigarettes and e-cigarettes. If you need help quitting, ask your health care provider. Summary  Having a healthy lifestyle and getting preventive care can help to protect your health and wellness after age 67.  Screening and testing are the best way to find a health problem early and help you avoid having a fall. Early diagnosis and treatment give you the best chance for managing medical conditions that are more common for people who are older than age 67.  Falls are a major cause of broken bones and head injuries in people who are older than age 67. Take precautions to prevent a fall at home.  Work with your health care provider to learn what changes you can make to improve your health and wellness and to prevent falls. This information is not intended to replace advice given to you by your health care provider. Make sure you discuss any questions you have with your health care provider. Document Released: 02/19/2017 Document Revised: 07/30/2018 Document Reviewed: 02/19/2017 Elsevier Patient Education  2020 [employer].

## 2018-12-30 NOTE — Progress Notes (Signed)
[name] [birth year] [id number]    History:    Presents for breast and pelvic exam.  Postmenopausal/no HRT/no bleeding.  Normal mammogram history.  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  2019 T score -1.5 FRAX 8.8% / 0.7%.  2010 benign colon polyp did not tolerate anesthesia well, had PVCs and was hospitalized, 2016- barium enema, 09/2018 virtual colonoscopy with questionable polyp.  Primary care manages hypothyroidism, hypertension and hypercholesteremia.  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  Not sexually active  Past medical history, past surgical history, family history and social history were all reviewed and documented in the EPIC chart.  Retired Glass blower/designer.  One daughter, lives local.  Originally from [place] has not been able to visit family since December due to COVID  ROS:  A ROS was performed and pertinent positives and negatives are included.  Exam:  Vitals:   12/30/18 1004  BP: 124/80  Weight: 172 lb (78 kg)  Height: 5\' 3"  (1.6 m)   Body mass index is 30.47 kg/m.   General appearance:  Normal Thyroid:  Symmetrical, normal in size, without palpable masses or nodularity. Respiratory  Auscultation:  Clear without wheezing or rhonchi Cardiovascular  Auscultation:  Regular rate, without rubs, murmurs or gallops  Edema/varicosities:  Not grossly evident Abdominal  Soft,nontender, without masses, guarding or rebound.  Liver/spleen:  No organomegaly noted  Hernia:  None appreciated  Skin  Inspection:  Grossly normal   Breasts: Examined lying and sitting.     Right: Without masses, retractions, discharge or axillary adenopathy.     Left: Without masses, retractions, discharge or axillary adenopathy. Gentitourinary   Inguinal/mons:  Normal without inguinal adenopathy  External genitalia:  Normal  BUS/Urethra/Skene's  glands:  Normal  Vagina:  Normal  Cervix:  Normal  Uterus:   normal in size, shape and contour.  Midline and mobile  Adnexa/parametria:     Rt: Without masses or tenderness.   Lt: Without masses or tenderness.  Anus and perineum: Normal  Digital rectal exam: Normal sphincter tone without palpated masses or tenderness  Assessment/Plan:  68 y.o. SBF G1, P1 for breast and pelvic exam with no complaints.  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  Keep scheduled follow-up for questionable colon polyp.  Encouraged increased regular weightbearing and balance type exercise, home safety, fall prevention discussed.  Aware of need to decrease calories/carbs.  Shingrex reviewed unsure if she has received will check with primary.  Pap    [place], 10:20 AM 12/30/2018

## 2018-12-31 LAB — PAP IG W/ RFLX HPV ASCU

## 2019-01-01 LAB — URINALYSIS, COMPLETE W/RFL CULTURE
Bacteria, UA: NONE SEEN /HPF
Bilirubin Urine: NEGATIVE
Glucose, UA: NEGATIVE
Hyaline Cast: NONE SEEN /LPF
Ketones, ur: NEGATIVE
Leukocyte Esterase: NEGATIVE
Nitrites, Initial: NEGATIVE
Protein, ur: NEGATIVE
Specific Gravity, Urine: 1.021 (ref 1.001–1.03)
WBC, UA: NONE SEEN /HPF (ref 0–5)
pH: <=5 (ref 5.0–8.0)

## 2019-01-01 LAB — URINE CULTURE
MICRO NUMBER:: 865936
SPECIMEN QUALITY:: ADEQUATE

## 2019-01-01 LAB — CULTURE INDICATED

## 2019-01-05 ENCOUNTER — Other Ambulatory Visit: Payer: Self-pay

## 2019-01-05 ENCOUNTER — Encounter (HOSPITAL_COMMUNITY): Payer: Self-pay

## 2019-01-05 ENCOUNTER — Ambulatory Visit (HOSPITAL_COMMUNITY)
Admission: EM | Admit: 2019-01-05 | Discharge: 2019-01-05 | Disposition: A | Discharge status: Home / Self Care | Payer: Medicare Other | Attending: Urgent Care | Admitting: Urgent Care

## 2019-01-05 DIAGNOSIS — K648 Other hemorrhoids: Secondary | ICD-10-CM | POA: Insufficient documentation

## 2019-01-05 DIAGNOSIS — R5383 Other fatigue: Secondary | ICD-10-CM | POA: Diagnosis present

## 2019-01-05 DIAGNOSIS — K625 Hemorrhage of anus and rectum: Secondary | ICD-10-CM | POA: Insufficient documentation

## 2019-01-05 DIAGNOSIS — E039 Hypothyroidism, unspecified: Secondary | ICD-10-CM | POA: Insufficient documentation

## 2019-01-05 DIAGNOSIS — I10 Essential (primary) hypertension: Secondary | ICD-10-CM | POA: Diagnosis present

## 2019-01-05 LAB — CBC
HCT: 37.8 % (ref 36.0–46.0)
Hemoglobin: 13.6 g/dL (ref 12.0–15.0)
MCH: 29.6 pg (ref 26.0–34.0)
MCHC: 36 g/dL (ref 30.0–36.0)
MCV: 82.4 fL (ref 80.0–100.0)
Platelets: 205 10*3/uL (ref 150–400)
RBC: 4.59 MIL/uL (ref 3.87–5.11)
RDW: 14.8 % (ref 11.5–15.5)
WBC: 4.9 10*3/uL (ref 4.0–10.5)
nRBC: 0.4 % — ABNORMAL HIGH (ref 0.0–0.2)

## 2019-01-05 MED ORDER — LIDOCAINE 5 % EX OINT: 1.0000 "application " | TOPICAL_OINTMENT | Freq: Two times a day (BID) | CUTANEOUS | 0 refills | Status: DC | PRN

## 2019-01-05 MED ORDER — HYDROCORTISONE ACETATE 25 MG RE SUPP: 25.0000 mg | Freq: Two times a day (BID) | RECTAL | 0 refills | Status: DC

## 2019-01-05 NOTE — ED Provider Notes (Signed)
MRN: [id number] DOB: 01/11/1951  Subjective:   [name] is a 68 y.o. female presenting for acute onset of blood in her stools this morning.  This is a recurrent problem for the patient but will prompt her to come in is that she was unable to get in touch with her gastroenterologist and she has felt a little bit more fatigued.  Patient does have a chronic history of constipation, states that it is related to her hypothyroidism and the fatigue is not unfamiliar to her in this context either.  However with the blood that she is on her stool she wanted to make sure she got checked.  She has not had a colonoscopy given that she has difficulty with anesthesia.  States that she had a recent screening with her gastroenterologist through virtual visit.  Denies history of external hemorrhoids but has been told she has internal hemorrhoids.  She is previously done well with suppositories and lidocaine for pain.  Patient reports that she does have some mild pain rectally but thinks it is more related to her constipation.  She also has a history of hypertension, reports compliance with her medications.   No current facility-administered medications for this encounter.   Current Outpatient Medications:  .  bacitracin ophthalmic ointment, Place 1 application into both eyes as needed., Disp: , Rfl:  .  chlorpheniramine-HYDROcodone (TUSSIONEX PENNKINETIC ER) 10-8 MG/5ML SUER, Take 5 mLs by mouth at bedtime as needed., Disp: 60 mL, Rfl: 0 .  Cholecalciferol (VITAMIN D-3) 5000 units TABS, Take 1 tablet by mouth daily., Disp: , Rfl:  .  fish oil-omega-3 fatty acids 1000 MG capsule, Take 1 g by mouth daily., Disp: , Rfl:  .  fluticasone (FLONASE) 50 MCG/ACT nasal spray, Place 1 spray into both nostrils as needed., Disp: , Rfl:  .  hydrocortisone (ANUSOL-HC) 25 MG suppository, Place 1 suppository (25 mg total) rectally 2 (two) times daily as needed for hemorrhoids or anal itching., Disp: 12 suppository, Rfl:  0 .  levothyroxine (SYNTHROID, LEVOTHROID) 75 MCG tablet, Take 75 mcg by mouth daily before breakfast., Disp: , Rfl:  .  metoprolol succinate (TOPROL-XL) 50 MG 24 hr tablet, Take 1 tablet (50 mg total) by mouth daily. Take with or immediately following a meal., Disp: 90 tablet, Rfl: 2 .  Multiple Vitamin (MULTIVITAMIN WITH MINERALS) TABS tablet, Take 1 tablet by mouth daily., Disp: , Rfl:  .  polyethylene glycol (MIRALAX / GLYCOLAX) packet, Take 17 g by mouth daily as needed for mild constipation. , Disp: , Rfl:  .  Probiotic Product (PROBIOTIC ADVANCED PO), Take 1 capsule by mouth daily. , Disp: , Rfl:  .  rosuvastatin (CRESTOR) 10 MG tablet, Take 10 mg by mouth 3 (three) times a week. Every Monday, Wednesday, and Friday in the evenings., Disp: , Rfl:  .  vitamin E 400 UNIT capsule, Take 1 capsule by mouth daily., Disp: , Rfl:     Allergies  Allergen Reactions  . Lipitor [Atorvastatin] Other (See Comments)    Muscle aches  . Diphenhydramine Palpitations  . Epinephrine Palpitations    Past Medical History:  Diagnosis Date  . Arthritis    Neck  . Complication of anesthesia    SVT  . Dysplasia of cervix, low grade (CIN 1)    LEEP cervical conization September 2017 margins negative CIN-1  . GERD (gastroesophageal reflux disease)   . Hyperlipidemia   . Hypothyroidism   . Osteopenia 10/2017   T score -1.9 FRAX 8.8% / 0.7%.  Stable from prior DEXA.  [name] SVT (supraventricular tachycardia) (HCC)      Past Surgical History:  Procedure Laterality Date  . BREAST SURGERY  1976   CYST REMOVED  . CESAREAN SECTION  1987  . left thumb surgery and Carpal Tunnel Release  9/14    ROS  Objective:   Vitals: BP (!) 147/89 (BP Location: Right Arm)   Pulse 72   Temp 98.3 F (36.8 C) (Oral)   Resp 17   Wt 178 lb (80.7 kg)   LMP 11/26/2000   SpO2 99%   BMI 31.53 kg/m   Physical Exam Constitutional:      General: She is not in acute distress.    Appearance: Normal appearance. She is  well-developed and normal weight. She is not ill-appearing, toxic-appearing or diaphoretic.  HENT:     Head: Normocephalic and atraumatic.     Right Ear: External ear normal.     Left Ear: External ear normal.     Nose: Nose normal.     Mouth/Throat:     Mouth: Mucous membranes are moist.     Pharynx: Oropharynx is clear.  Eyes:     General: No scleral icterus.    Extraocular Movements: Extraocular movements intact.     Pupils: Pupils are equal, round, and reactive to light.  Cardiovascular:     Rate and Rhythm: Normal rate and regular rhythm.     Heart sounds: Normal heart sounds. No murmur. No friction rub. No gallop.   Pulmonary:     Effort: Pulmonary effort is normal. No respiratory distress.     Breath sounds: Normal breath sounds. No stridor. No wheezing, rhonchi or rales.  Abdominal:     General: Bowel sounds are normal. There is no distension.     Palpations: Abdomen is soft. There is no mass.     Tenderness: There is no abdominal tenderness. There is no right CVA tenderness, left CVA tenderness, guarding or rebound.  Genitourinary:    Comments: Patient refused exam. Skin:    General: Skin is warm and dry.     Coloration: Skin is not pale.     Findings: No rash.  Neurological:     General: No focal deficit present.     Mental Status: She is alert and oriented to person, place, and time.  Psychiatric:        Mood and Affect: Mood normal.        Behavior: Behavior normal.        Thought Content: Thought content normal.        Judgment: Judgment normal.      Assessment and Plan :   1. Rectal bleeding   2. Hypothyroidism, unspecified type   3. Other fatigue   4. Internal hemorrhoids   5. Essential hypertension     Discussed differential with patient, CBC is pending.  I was agreeable to refilling her hydrocortisone suppositories and lidocaine topically.  However, I cautioned patient to continue to set up follow-up with her gastroenterologist as she may need more  intervention regarding her internal hemorrhoids, possible external hemorrhoids.  Also discussed possibility of upper versus lower GI bleeding.  Will redirect patient to the emergency room if she is severely anemic, patient is in agreement with this treatment plan.  She is to maintain her medications for her hypothyroidism, HTN, chronic constipation. Counseled patient on potential for adverse effects with medications prescribed/recommended today, ER and return-to-clinic precautions discussed, patient verbalized understanding.    [name], PA-C 01/05/19  1645  

## 2019-01-05 NOTE — ED Triage Notes (Signed)
Pt states she has seen blood in her stools today and she feels fatigued.

## 2019-01-13 ENCOUNTER — Encounter: Payer: Self-pay | Admitting: Emergency Medicine

## 2019-01-13 ENCOUNTER — Ambulatory Visit
Admission: EM | Admit: 2019-01-13 | Discharge: 2019-01-13 | Disposition: A | Discharge status: Home / Self Care | Payer: Medicare Other | Attending: Emergency Medicine | Admitting: Emergency Medicine

## 2019-01-13 ENCOUNTER — Other Ambulatory Visit: Payer: Self-pay

## 2019-01-13 DIAGNOSIS — B370 Candidal stomatitis: Secondary | ICD-10-CM | POA: Diagnosis not present

## 2019-01-13 MED ORDER — CLOTRIMAZOLE 10 MG MT TROC: 10.0000 mg | Freq: Every day | OROMUCOSAL | 0 refills | Status: AC

## 2019-01-13 NOTE — Discharge Instructions (Addendum)
Take medication as prescribed. Rinse mouth after nasal spray.Monitor.   Follow up with your primary care physician this week as needed. Return to Urgent care for new or worsening concerns.

## 2019-01-13 NOTE — ED Triage Notes (Signed)
Pt c/o dry mouth, and white film on her tongue. She also has post nasal drip. Started today. Mouth is not sore at all.

## 2019-01-13 NOTE — ED Provider Notes (Signed)
[facility] ____________________________________________  Time seen: Approximately 3:43 PM  I have reviewed the triage vital signs and the nursing notes.   HISTORY  Chief Complaint dry mouth   HPI [name] is a 68 y.o. female presenting for evaluation of dry mouth and white film to tongue and top of mouth.  Reports this is been gradual onset over the last 1 week with noticing the whiteness in the last 2 days.  Denies any pain or swelling.  Denies food or medication changes, except she did just recently restart her Nasonex nasal spray.  Reports she has had some postnasal drainage in the last 1 to 2 weeks consistent with her normal seasonal allergies.  Denies any associated sore throat, cough, fevers, changes in taste or smell.  Denies chest pain, shortness of breath or known sick contacts.  Continues to eat and drink normally.  Denies history of the same.  No recent antibiotic use or inhaler use.  Cari Caraway, MD: PCP    Past Medical History:  Diagnosis Date   Arthritis    Neck   Complication of anesthesia    SVT   Dysplasia of cervix, low grade (CIN 1)    LEEP cervical conization September 2017 margins negative CIN-1   GERD (gastroesophageal reflux disease)    Hyperlipidemia    Hypothyroidism    Osteopenia 10/2017   T score -1.9 FRAX 8.8% / 0.7%.  Stable from prior DEXA.   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   CIN I (cervical intraepithelial neoplasia I) 01/02/2016     Preoperative cardiovascular examination 11/10/2015   ASCUS with positive high risk HPV cervical 12/13/2014   Eyelid retraction 12/02/2014   Dry eye syndrome of bilateral lacrimal glands 12/01/2014   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   Other disorders affecting eyelid function 12/01/2014   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   Peripheral visual field defect of both eyes 12/01/2014   Age-related nuclear cataract of both eyes 09/06/2014   Anatomical narrow angle of both eyes 09/06/2014   Hx of Hashimoto thyroiditis 09/06/2014   Keratoconjunctivitis sicca of both eyes not specified as Sjogren's 09/06/2014   Meibomian gland dysfunction (MGD) of upper and lower lids of both eyes 09/06/2014   Abnormal Papanicolaou smear of cervix with positive human papilloma virus (HPV) test 06/27/2014   Hypokalemia 03/04/2014   History of paroxysmal supraventricular tachycardia 02/25/2014   Osteopenia 04/30/2013   Dysphonia 04/05/2013   Laryngopharyngeal reflux 04/05/2013   Hashimoto's thyroiditis 03/29/2013   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   Hypercalcemia 05/25/2012   Hypothyroidism 04/27/2012   Hyperlipidemia 04/27/2012   Paroxysmal supraventricular tachycardia ([place]) 04/27/2012   Tachycardia 01/22/2012   Endometrial polyp 12/04/2010   Fibroid, uterus 11/27/2010   Acquired hypothyroidism 01/17/2010   Other and unspecified hyperlipidemia 02/01/2008   Other specified cardiac dysrhythmias(427.89) 12/07/2007    Past Surgical History:  Procedure Laterality Date   [place]   CYST REMOVED   CESAREAN SECTION  1987   left thumb  surgery and Carpal Tunnel Release  9/14     No current facility-administered medications for this encounter.   Current Outpatient Medications:    Cholecalciferol (VITAMIN D-3) 5000 units TABS, Take 1 tablet by mouth daily., Disp: , Rfl:    fish oil-omega-3 fatty acids 1000 MG  capsule, Take 1 g by mouth daily., Disp: , Rfl:    fluticasone (FLONASE) 50 MCG/ACT nasal spray, Place 1 spray into both nostrils as needed., Disp: , Rfl:    hydrocortisone (ANUSOL-HC) 25 MG suppository, Place 1 suppository (25 mg total) rectally 2 (two) times daily., Disp: 20 suppository, Rfl: 0   levothyroxine (SYNTHROID, LEVOTHROID) 75 MCG tablet, Take 75 mcg by mouth daily before breakfast., Disp: , Rfl:    lidocaine (XYLOCAINE) 5 % ointment, Apply 1 application topically 2 (two) times daily as needed. Use a pea size amount only as needed., Disp: 30 g, Rfl: 0   metoprolol succinate (TOPROL-XL) 50 MG 24 hr tablet, Take 1 tablet (50 mg total) by mouth daily. Take with or immediately following a meal., Disp: 90 tablet, Rfl: 2   Multiple Vitamin (MULTIVITAMIN WITH MINERALS) TABS tablet, Take 1 tablet by mouth daily., Disp: , Rfl:    polyethylene glycol (MIRALAX / GLYCOLAX) packet, Take 17 g by mouth daily as needed for mild constipation. , Disp: , Rfl:    Probiotic Product (PROBIOTIC ADVANCED PO), Take 1 capsule by mouth daily. , Disp: , Rfl:    rosuvastatin (CRESTOR) 10 MG tablet, Take 10 mg by mouth 3 (three) times a week. Every Monday, Wednesday, and Friday in the evenings., Disp: , Rfl:    vitamin E 400 UNIT capsule, Take 1 capsule by mouth daily., Disp: , Rfl:    bacitracin ophthalmic ointment, Place 1 application into both eyes as needed., Disp: , Rfl:    chlorpheniramine-HYDROcodone (TUSSIONEX PENNKINETIC ER) 10-8 MG/5ML SUER, Take 5 mLs by mouth at bedtime as needed., Disp: 60 mL, Rfl: 0   clotrimazole (MYCELEX) 10 MG troche, Take 1 tablet (10 mg total) by mouth 5 (five) times daily for 7 days., Disp: 70 Troche, Rfl: 0  Allergies Lipitor [atorvastatin], Diphenhydramine, and Epinephrine  Family History  Problem Relation Age of Onset   Hypertension Mother    Heart disease Mother    Mitral valve prolapse Mother    Glaucoma Mother    Arthritis Mother    Hypertension  Father    Heart disease Father    Colon polyps Father    Glaucoma Father    Arthritis Father    Hypertension Brother    Mitral valve prolapse Brother    Glaucoma Brother    Arthritis Brother    Arthritis Sister     Social History Social History   Tobacco Use   Smoking status: Never Smoker   Smokeless tobacco: Never Used  Substance Use Topics   Alcohol use: Not Currently   Drug use: No    Review of Systems Constitutional: No fever ENT: No sore throat. As above.  Cardiovascular: Denies chest pain. Respiratory: Denies shortness of breath. Gastrointestinal: No abdominal pain.  Skin: Negative for rash. ____________________________________________   PHYSICAL EXAM:  VITAL SIGNS: ED Triage Vitals  Enc Vitals Group     BP 01/13/19 1504 (!) 127/54     Pulse Rate 01/13/19 1504 (!) 103     Resp 01/13/19 1504 18     Temp 01/13/19 1504 98.3 F (36.8 C)     Temp Source 01/13/19 1504 Oral     SpO2 01/13/19 1504  97 %     Weight 01/13/19 1500 170 lb (77.1 kg)     Height 01/13/19 1500 5\' 3"  (1.6 m)     Head Circumference --      Peak Flow --      Pain Score 01/13/19 1500 0     Pain Loc --      Pain Edu? --      Excl. in [place]? --     Constitutional: Alert and oriented. Well appearing and in no acute distress. Eyes: Conjunctivae are normal.  Head: Atraumatic. No sinus tenderness to palpation. No swelling. No erythema.  Ears: no erythema, normal TMs bilaterally.   Nose:No nasal congestion  Mouth/Throat: Mucous membranes are moist. No pharyngeal erythema. No tonsillar swelling or exudate.  Dorsal mid to posterior tongue with mild whitish discoloration, minimal erythema irritation to roof of mouth, no sores, no lesions, no edema. Neck: No stridor.  No cervical spine tenderness to palpation. Hematological/Lymphatic/Immunilogical: No cervical lymphadenopathy. Cardiovascular: Normal rate, regular rhythm. Grossly normal heart sounds.  Good peripheral  circulation. Respiratory: Normal respiratory effort.  No retractions. No wheezes, rales or rhonchi. Good air movement.  Musculoskeletal: Ambulatory with steady gait. Neurologic:  Normal speech and language. No gait instability. Skin:  Skin appears warm, dry and intact. No rash noted. Psychiatric: Mood and affect are normal. Speech and behavior are normal. ___________________________________________   LABS (all labs ordered are listed, but only abnormal results are displayed)    INITIAL IMPRESSION / ASSESSMENT AND PLAN / ED COURSE  Pertinent labs & imaging results that were available during my care of the patient were reviewed by me and considered in my medical decision making (see chart for details).  Well appearing patient.  Subjective report in appearance consistent with thrush, suspect related to recent restart of Nasonex for allergic rhinitis.  Does not tolerate oral antihistamines due to her history of SVT.  Without further symptoms, felt less likely to be COVID-19, patient declined COVID-19 testing.  Encouraged frequent mouth rinsing, particularly after nasal spray.  Will start on clotrimazole lozenges, encouraged monitoring and supportive care.Discussed indication, risks and benefits of medications with patient.  Discussed follow up with Primary care physician this week. Discussed follow up and return parameters including no resolution or any worsening concerns. Patient verbalized understanding and agreed to plan.   ____________________________________________   FINAL CLINICAL IMPRESSION(S) / ED DIAGNOSES  Final diagnoses:  [place]     ED Discharge Orders         Ordered    clotrimazole (MYCELEX) 10 MG troche  5 times daily     01/13/19 1545           Note: This dictation was prepared with Dragon dictation along with smaller phrase technology. Any transcriptional errors that result from this process are unintentional.         Marylene Land, NP 01/13/19  1705

## 2019-01-15 ENCOUNTER — Other Ambulatory Visit: Payer: Self-pay | Admitting: Gastroenterology

## 2019-01-20 NOTE — Progress Notes (Signed)
[facility]    Provider:  Dr [name] Requesting Provider: Cari Caraway, MD Primary Care Provider:  Cari Caraway, MD  CC:  Numbness and tingling  HPI:  [name] is a 68 y.o. female here as requested by Cari Caraway, MD for numbness and tingling left hand. PMHx past medical history Hashimoto's thyroiditis, cervical disc disease, intraductal papillary mucinous neoplasm, GERD, supraventricular tachycardia, allergies, hypothyroidism, nausea, hyperlipidemia.She has numbness and tingling in the feet and they feel tight, symptoms in the feet are mild, started 2 months ago, not progressive, she feels it in the toes but she feels tightness in the feet.  She has been walking a lot, she denies any pain in the feet, she feels it daily and worse after putting pressure on it. More tightness in the feet then tingling. The left hand is also tingling and tightness. Symmetric in the feet, Hand is worsening. In digits 1-4, she has woken up with the numbness, unknown if worse with certain tasks, no inciting events, worse if she wakes with it. Feels like her previous carpal Tunnel. Wrist splint helps. No other focal neurologic deficits, associated symptoms, inciting events or modifiable factors.  Reviewed notes, labs and imaging from outside physicians, which showed: I reviewed Dr. Guido Sander notes.  The patient complains of left lower forearm tingling and left hand tingling.  The symptoms started 2 weeks prior to her appointment in July.  At first off was on and off and was consistent before she was seen.  She was seeing a Restaurant manager, fast food.  She has awakened from sleeping with the same symptoms and it took 15 minutes for the index finger to return to normal sensation.  Neck and bilateral shoulders are bothering her left greater than right, she goes to a chiropractor has had several sessions, very a lot of tightness in her cervical spine. No radicular symptoms. She has had carpal tunnel  release on the left in 2014  Review of Systems: Patient complains of symptoms per HPI as well as the following symptoms numbnes, tingling, muscle pain and tightness. Pertinent negatives and positives per HPI. All others negative.   Social History   Socioeconomic History   Marital status: Widowed    Spouse name: Not on file   Number of children: 1   Years of education: Not on file   Highest education level: Master's degree (e.g., MA, MS, MEng, MEd, MSW, MBA)  Occupational History   Not on file  Social Needs   Financial resource strain: Not on file   Food insecurity    Worry: Not on file    Inability: Not on file   Transportation needs    Medical: Not on file    Non-medical: Not on file  Tobacco Use   Smoking status: Never Smoker   Smokeless tobacco: Never Used  Substance and Sexual Activity   Alcohol use: Not Currently   Drug use: No   Sexual activity: Not Currently    Partners: Male    Birth control/protection: Post-menopausal    Comment: 68 YEARS OLD, NO MORE THAN 5 PARTNERS ,des neg  Lifestyle   Physical activity    Days per week: Not on file    Minutes per session: Not on file   Stress: Not on file  Relationships   Social connections    Talks on phone: Not on file    Gets together: Not on file    Attends religious service: Not on file    Active member of club or organization: Not  on file    Attends meetings of clubs or organizations: Not on file    Relationship status: Not on file   Intimate partner violence    Fear of current or ex partner: Not on file    Emotionally abused: Not on file    Physically abused: Not on file    Forced sexual activity: Not on file  Other Topics Concern   Not on file  Social History Narrative   Her daughter lives at home with her    Right handed   Caffeine: coffee, soda, (about 1 cup/day)    Family History  Problem Relation Age of Onset   Hypertension Mother    Heart disease Mother    Mitral valve  prolapse Mother    Glaucoma Mother    Arthritis Mother    Von Willebrand disease Mother    Stroke Mother    Rheum arthritis Mother    Neuropathy Mother    Hypertension Father    Heart disease Father    Colon polyps Father    Glaucoma Father    Arthritis Father    Stroke Father    Rheum arthritis Father    Pulmonary embolism Father    Hypertension Brother    Mitral valve prolapse Brother    Glaucoma Brother    Arthritis Brother    Arthritis Sister     Past Medical History:  Diagnosis Date   Arthritis    Neck   Complication of anesthesia    SVT   Dysplasia of cervix, low grade (CIN 1)    LEEP cervical conization September 2017 margins negative CIN-1   GERD (gastroesophageal reflux disease)    Hyperlipidemia    Hypothyroidism    Hashimoto's per pt    Osteopenia 10/2017   T score -1.9 FRAX 8.8% / 0.7%.  Stable from prior DEXA.   SVT (supraventricular tachycardia) [facility])     Patient Active Problem List   Diagnosis Date Noted   Abnormal CT of the abdomen 02/25/2018   Abdominal pain, right upper quadrant 02/25/2018   Abnormal finding on radiology exam 02/25/2018   Allergic rhinitis 02/25/2018   Brash 02/25/2018   CN (constipation) 02/25/2018   LBP (low back pain) 02/25/2018   Combined form of age-related cataract, both eyes 04/07/2017   Posterior vitreous detachment of right eye 04/07/2017   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   CIN I (cervical intraepithelial neoplasia I) 01/02/2016   Preoperative cardiovascular examination 11/10/2015   ASCUS with positive high risk HPV cervical 12/13/2014   Eyelid retraction 12/02/2014   Dry eye syndrome of bilateral lacrimal glands 12/01/2014   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   Other disorders affecting eyelid function 12/01/2014   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   Peripheral visual field defect of both eyes 12/01/2014   Age-related nuclear cataract of both eyes  09/06/2014   Anatomical narrow angle of both eyes 09/06/2014   Hx of Hashimoto thyroiditis 09/06/2014   Keratoconjunctivitis sicca of both eyes not specified as Sjogren's 09/06/2014   Meibomian gland dysfunction (MGD) of upper and lower lids of both eyes 09/06/2014   Abnormal Papanicolaou smear of cervix with positive human papilloma virus (HPV) test 06/27/2014   Hypokalemia 03/04/2014   History of paroxysmal supraventricular tachycardia 02/25/2014   Osteopenia 04/30/2013   Dysphonia 04/05/2013   Laryngopharyngeal reflux 04/05/2013   Hashimoto's thyroiditis 03/29/2013   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   Hypercalcemia 05/25/2012   Hypothyroidism 04/27/2012   Hyperlipidemia 04/27/2012  Paroxysmal supraventricular tachycardia ([place]) 04/27/2012   Tachycardia 01/22/2012   Endometrial polyp 12/04/2010   Fibroid, uterus 11/27/2010   Acquired hypothyroidism 01/17/2010   Other and unspecified hyperlipidemia 02/01/2008   Other specified cardiac dysrhythmias(427.89) 12/07/2007    Past Surgical History:  Procedure Laterality Date   BREAST SURGERY  1976   CYST REMOVED   CESAREAN SECTION  1987   left thumb surgery and Carpal Tunnel Release  9/14   lower lid surgery   2020   dry eye from Hashimoto's    Current Outpatient Medications  Medication Sig Dispense Refill   Cholecalciferol (VITAMIN D-3) 5000 units TABS Take 1 tablet by mouth daily.     fish oil-omega-3 fatty acids 1000 MG capsule Take 1 g by mouth daily.     fluticasone (FLONASE) 50 MCG/ACT nasal spray Place 1 spray into both nostrils as needed.     hydrocortisone (ANUSOL-HC) 25 MG suppository Place 1 suppository (25 mg total) rectally 2 (two) times daily. 20 suppository 0   levothyroxine (SYNTHROID, LEVOTHROID) 75 MCG tablet Take 75 mcg by mouth daily before breakfast.     lidocaine (XYLOCAINE) 5 % ointment Apply 1 application topically 2 (two) times daily as  needed. Use a pea size amount only as needed. 30 g 0   metoprolol succinate (TOPROL-XL) 50 MG 24 hr tablet Take 1 tablet (50 mg total) by mouth daily. Take with or immediately following a meal. 90 tablet 2   Multiple Vitamin (MULTIVITAMIN WITH MINERALS) TABS tablet Take 1 tablet by mouth daily.     polyethylene glycol (MIRALAX / GLYCOLAX) packet Take 17 g by mouth daily as needed for mild constipation.      Probiotic Product (PROBIOTIC ADVANCED PO) Take 1 capsule by mouth daily.      rosuvastatin (CRESTOR) 10 MG tablet Take 10 mg by mouth 3 (three) times a week. Every Monday, Wednesday, and Friday in the evenings.     vitamin E 400 UNIT capsule Take 1 capsule by mouth daily.     No current facility-administered medications for this visit.     Allergies as of 01/21/2019 - Review Complete 01/21/2019  Allergen Reaction Noted   Lipitor [atorvastatin] Other (See Comments) 01/20/2013   Diphenhydramine Palpitations 01/19/2018   Epinephrine Palpitations 03/04/2014    Vitals: BP 108/71 (BP Location: Right Arm, Patient Position: Sitting)    Pulse (!) 59    Temp (!) 97.1 F (36.2 C) Comment: taken by check-in staff   Ht 5\' 3"  (1.6 m)    Wt 172 lb (78 kg)    LMP 11/26/2000    BMI 30.47 kg/m  Last Weight:  Wt Readings from Last 1 Encounters:  01/21/19 172 lb (78 kg)   Last Height:   Ht Readings from Last 1 Encounters:  01/21/19 5\' 3"  (1.6 m)     Physical exam: Exam: Gen: NAD, conversant, well nourised, obese, well groomed                     CV: RRR, no MRG. No Carotid Bruits. No peripheral edema, warm, nontender Eyes: Conjunctivae clear without exudates or hemorrhage  Neuro: Detailed Neurologic Exam  Speech:    Speech is normal; fluent and spontaneous with normal comprehension.  Cognition:    The patient is oriented to person, place, and time;     recent and remote memory intact;     language fluent;     normal attention, concentration,     fund of knowledge Cranial  Nerves:  The pupils are equal, round, and reactive to light.Attempted fundoscopic exam could not visualize. Visual fields are full to finger confrontation. Extraocular movements are intact. Trigeminal sensation is intact and the muscles of mastication are normal. The face is symmetric(exopthalmost and ptosis bilat). The palate elevates in the midline. Hearing intact. Voice is normal. Shoulder shrug is normal. The tongue has normal motion without fasciculations.   Coordination:    Normal finger to nose  Gait:    Normal native gait  Motor Observation:    No asymmetry, no atrophy, and no involuntary movements noted. Tone:    Normal muscle tone.    Posture:    Posture is normal. normal erect    Strength: weakness left opponens pollicis otherwise , Strength is V/V in the upper and lower limbs.      Sensation: intact to LT     Reflex Exam:  DTR's:    Deep tendon reflexes in the upper and lower extremities are normal bilaterally.   Toes:    The toes are downgoing bilaterally.   Clonus:    Clonus is absent.    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  - EMG/NCS left arm for CTS and left foot - tightnes in the feet with pain and tingling with pressure may be more structural such as plantar fasciitis(she has pain on palpation of arch) but need to evaluate for peripheral neuropathy with Lab work and emg/ncs. Reviewed labs from [facility] CC normal, CMP unremarkable BUN 16, Creatine 0.8, 05/14/2018. Also labs from 01/20/2019: TSH normal, BUN 15 and Creat 0.75 CMp normal, B12 851.   Orders Placed This Encounter  Procedures   Hemoglobin A1c   NCV with EMG(electromyography)    Cc: Cari Caraway, MD  Sarina Ill, MD  [facility] [address] [place], [place] [address]  Phone [phone] Fax [phone]

## 2019-01-21 ENCOUNTER — Ambulatory Visit: Payer: Medicare Other | Admitting: Neurology

## 2019-01-21 ENCOUNTER — Encounter: Payer: Self-pay | Admitting: Neurology

## 2019-01-21 ENCOUNTER — Other Ambulatory Visit: Payer: Self-pay

## 2019-01-21 VITALS — BP 108/71 | HR 59 | Temp 97.1°F | Ht 63.0 in | Wt 172.0 lb

## 2019-01-21 DIAGNOSIS — G5602 Carpal tunnel syndrome, left upper limb: Secondary | ICD-10-CM | POA: Diagnosis not present

## 2019-01-21 DIAGNOSIS — R200 Anesthesia of skin: Secondary | ICD-10-CM | POA: Diagnosis not present

## 2019-01-21 DIAGNOSIS — R7309 Other abnormal glucose: Secondary | ICD-10-CM | POA: Diagnosis not present

## 2019-01-21 DIAGNOSIS — R202 Paresthesia of skin: Secondary | ICD-10-CM | POA: Diagnosis not present

## 2019-01-21 NOTE — Patient Instructions (Signed)
EMG/NCS HgbA1c   Peripheral Neuropathy Peripheral neuropathy is a type of nerve damage. It affects nerves that carry signals between the spinal cord and the arms, legs, and the rest of the body (peripheral nerves). It does not affect nerves in the spinal cord or brain. In peripheral neuropathy, one nerve or a group of nerves may be damaged. Peripheral neuropathy is a broad category that includes many specific nerve disorders, like diabetic neuropathy, hereditary neuropathy, and carpal tunnel syndrome. What are the causes? This condition may be caused by:  Diabetes. This is the most common cause of peripheral neuropathy.  Nerve injury.  Pressure or stress on a nerve that lasts a long time.  Lack (deficiency) of B vitamins. This can result from alcoholism, poor diet, or a restricted diet.  Infections.  Autoimmune diseases, such as rheumatoid arthritis and systemic lupus erythematosus.  Nerve diseases that are passed from parent to child (inherited).  Some medicines, such as cancer medicines (chemotherapy).  Poisonous (toxic) substances, such as lead and mercury.  Too little blood flowing to the legs.  Kidney disease.  Thyroid disease. In some cases, the cause of this condition is not known. What are the signs or symptoms? Symptoms of this condition depend on which of your nerves is damaged. Common symptoms include:  Loss of feeling (numbness) in the feet, hands, or both.  Tingling in the feet, hands, or both.  Burning pain.  Very sensitive skin.  Weakness.  Not being able to move a part of the body (paralysis).  Muscle twitching.  Clumsiness or poor coordination.  Loss of balance.  Not being able to control your bladder.  Feeling dizzy.  Sexual problems. How is this diagnosed? Diagnosing and finding the cause of peripheral neuropathy can be difficult. Your health care provider will take your medical history and do a physical exam. A neurological exam will  also be done. This involves checking things that are affected by your brain, spinal cord, and nerves (nervous system). For example, your health care provider will check your reflexes, how you move, and what you can feel. You may have other tests, such as:  Blood tests.  Electromyogram (EMG) and nerve conduction tests. These tests check nerve function and how well the nerves are controlling the muscles.  Imaging tests, such as CT scans or MRI to rule out other causes of your symptoms.  Removing a small piece of nerve to be examined in a lab (nerve biopsy). This is rare.  Removing and examining a small amount of the fluid that surrounds the brain and spinal cord (lumbar puncture). This is rare. How is this treated? Treatment for this condition may involve:  Treating the underlying cause of the neuropathy, such as diabetes, kidney disease, or vitamin deficiencies.  Stopping medicines that can cause neuropathy, such as chemotherapy.  Medicine to relieve pain. Medicines may include: ? Prescription or over-the-counter pain medicine. ? Antiseizure medicine. ? Antidepressants. ? Pain-relieving patches that are applied to painful areas of skin.  Surgery to relieve pressure on a nerve or to destroy a nerve that is causing pain.  Physical therapy to help improve movement and balance.  Devices to help you move around (assistive devices). Follow these instructions at home: Medicines  Take over-the-counter and prescription medicines only as told by your health care provider. Do not take any other medicines without first asking your health care provider.  Do not drive or use heavy machinery while taking prescription pain medicine. Lifestyle   Do not use any  products that contain nicotine or tobacco, such as cigarettes and e-cigarettes. Smoking keeps blood from reaching damaged nerves. If you need help quitting, ask your health care provider.  Avoid or limit alcohol. Too much alcohol can  cause a vitamin B deficiency, and vitamin B is needed for healthy nerves.  Eat a healthy diet. This includes: ? Eating foods that are high in fiber, such as fresh fruits and vegetables, whole grains, and beans. ? Limiting foods that are high in fat and processed sugars, such as fried or sweet foods. General instructions   If you have diabetes, work closely with your health care provider to keep your blood sugar under control.  If you have numbness in your feet: ? Check every day for signs of injury or infection. Watch for redness, warmth, and swelling. ? Wear padded socks and comfortable shoes. These help protect your feet.  Develop a good support system. Living with peripheral neuropathy can be stressful. Consider talking with a mental health specialist or joining a support group.  Use assistive devices and attend physical therapy as told by your health care provider. This may include using a walker or a cane.  Keep all follow-up visits as told by your health care provider. This is important. Contact a health care provider if:  You have new signs or symptoms of peripheral neuropathy.  You are struggling emotionally from dealing with peripheral neuropathy.  Your pain is not well-controlled. Get help right away if:  You have an injury or infection that is not healing normally.  You develop new weakness in an arm or leg.  You fall frequently. Summary  Peripheral neuropathy is when the nerves in the arms, or legs are damaged, resulting in numbness, weakness, or pain.  There are many causes of peripheral neuropathy, including diabetes, pinched nerves, vitamin deficiencies, autoimmune disease, and hereditary conditions.  Diagnosing and finding the cause of peripheral neuropathy can be difficult. Your health care provider will take your medical history, do a physical exam, and do tests, including blood tests and nerve function tests.  Treatment involves treating the underlying  cause of the neuropathy and taking medicines to help control pain. Physical therapy and assistive devices may also help. This information is not intended to replace advice given to you by your health care provider. Make sure you discuss any questions you have with your health care provider. Document Released: 03/29/2002 Document Revised: 03/21/2017 Document Reviewed: 06/17/2016 Elsevier Patient Education  2020 [employer].

## 2019-01-22 LAB — HEMOGLOBIN A1C
Est. average glucose Bld gHb Est-mCnc: 111 mg/dL
Hgb A1c MFr Bld: 5.5 % (ref 4.8–5.6)

## 2019-01-27 ENCOUNTER — Encounter: Payer: Self-pay | Admitting: Gynecology

## 2019-02-09 ENCOUNTER — Other Ambulatory Visit (HOSPITAL_COMMUNITY)
Admission: RE | Admit: 2019-02-09 | Discharge: 2019-02-09 | Disposition: A | Discharge status: Home / Self Care | Payer: Medicare Other | Source: Ambulatory Visit | Attending: Gastroenterology | Admitting: Gastroenterology

## 2019-02-09 ENCOUNTER — Telehealth: Payer: Self-pay | Admitting: Cardiology

## 2019-02-09 ENCOUNTER — Other Ambulatory Visit: Payer: Self-pay | Admitting: Gastroenterology

## 2019-02-09 DIAGNOSIS — Z20828 Contact with and (suspected) exposure to other viral communicable diseases: Secondary | ICD-10-CM | POA: Diagnosis not present

## 2019-02-09 DIAGNOSIS — Z01812 Encounter for preprocedural laboratory examination: Secondary | ICD-10-CM | POA: Insufficient documentation

## 2019-02-09 NOTE — Telephone Encounter (Signed)
Pt states Dr. Curt Bears recommended that she double her Toprol the last colonoscopy she had performed. States he told her to double her Toprol the day of procedure and for 2 days after. She would like to know if she should do this again for her colonoscopy this Friday. Aware I will forward to him for advisement and call her back Thursday w/ his recommendation

## 2019-02-09 NOTE — Telephone Encounter (Signed)
New Message:    Pt said she is having a Colonoscopy on Friday. She said she wanted to find out from Dr Curt Bears if she needs to double her medicine liek she did last time?

## 2019-02-10 ENCOUNTER — Encounter (HOSPITAL_COMMUNITY): Payer: Self-pay

## 2019-02-10 ENCOUNTER — Other Ambulatory Visit: Payer: Self-pay

## 2019-02-10 LAB — NOVEL CORONAVIRUS, NAA (HOSP ORDER, SEND-OUT TO REF LAB; TAT 18-24 HRS): SARS-CoV-2, NAA: NOT DETECTED

## 2019-02-10 NOTE — Telephone Encounter (Signed)
Ok to increase metoprolol around the time of procedure.

## 2019-02-10 NOTE — Progress Notes (Signed)
SPOKE W/  Elonna     SCREENING SYMPTOMS OF COVID 19:   COUGH--NO  RUNNY NOSE--- NO  SORE THROAT---NO  NASAL CONGESTION----NO  SNEEZING----NO  SHORTNESS OF BREATH---NO  DIFFICULTY BREATHING---NO  TEMP >100.0 -----NO  UNEXPLAINED BODY ACHES------NO  CHILLS -------- NO  HEADACHES ---------NO  LOSS OF SMELL/ TASTE --------NO    HAVE YOU OR ANY FAMILY MEMBER TRAVELLED PAST 14 DAYS OUT OF THE   [place] STATE----NO COUNTRY----NO  HAVE YOU OR ANY FAMILY MEMBER BEEN EXPOSED TO ANYONE WITH COVID 19? NO

## 2019-02-11 NOTE — Telephone Encounter (Signed)
Left detailed message informing pt that ok to increase around time of her procedure.  Aware  I will send instruction via mychart. Advised to call back if she needed to discuss further

## 2019-02-12 ENCOUNTER — Encounter (HOSPITAL_COMMUNITY)
Admission: RE | Disposition: A | Discharge status: Home / Self Care | Payer: Self-pay | Source: Home / Self Care | Attending: Gastroenterology

## 2019-02-12 ENCOUNTER — Ambulatory Visit (HOSPITAL_COMMUNITY): Payer: Medicare Other | Admitting: Anesthesiology

## 2019-02-12 ENCOUNTER — Ambulatory Visit (HOSPITAL_COMMUNITY)
Admission: RE | Admit: 2019-02-12 | Discharge: 2019-02-12 | Disposition: A | Discharge status: Home / Self Care | Payer: Medicare Other | Attending: Gastroenterology | Admitting: Gastroenterology

## 2019-02-12 ENCOUNTER — Encounter (HOSPITAL_COMMUNITY): Payer: Self-pay | Admitting: *Deleted

## 2019-02-12 ENCOUNTER — Other Ambulatory Visit: Payer: Self-pay

## 2019-02-12 DIAGNOSIS — K625 Hemorrhage of anus and rectum: Secondary | ICD-10-CM | POA: Diagnosis not present

## 2019-02-12 DIAGNOSIS — Z79890 Hormone replacement therapy (postmenopausal): Secondary | ICD-10-CM | POA: Insufficient documentation

## 2019-02-12 DIAGNOSIS — E785 Hyperlipidemia, unspecified: Secondary | ICD-10-CM | POA: Insufficient documentation

## 2019-02-12 DIAGNOSIS — D120 Benign neoplasm of cecum: Secondary | ICD-10-CM | POA: Insufficient documentation

## 2019-02-12 DIAGNOSIS — I10 Essential (primary) hypertension: Secondary | ICD-10-CM | POA: Diagnosis not present

## 2019-02-12 DIAGNOSIS — E039 Hypothyroidism, unspecified: Secondary | ICD-10-CM | POA: Diagnosis not present

## 2019-02-12 DIAGNOSIS — K648 Other hemorrhoids: Secondary | ICD-10-CM | POA: Insufficient documentation

## 2019-02-12 DIAGNOSIS — Z79899 Other long term (current) drug therapy: Secondary | ICD-10-CM | POA: Diagnosis not present

## 2019-02-12 HISTORY — DX: Personal history of other diseases of the nervous system and sense organs: Z86.69

## 2019-02-12 HISTORY — PX: POLYPECTOMY: SHX5525

## 2019-02-12 HISTORY — DX: Presence of spectacles and contact lenses: Z97.3

## 2019-02-12 HISTORY — DX: Autoimmune thyroiditis: E06.3

## 2019-02-12 HISTORY — PX: COLONOSCOPY WITH PROPOFOL: SHX5780

## 2019-02-12 SURGERY — COLONOSCOPY WITH PROPOFOL
Anesthesia: Monitor Anesthesia Care

## 2019-02-12 MED ORDER — SODIUM CHLORIDE 0.9 % IV SOLN: INTRAVENOUS | Status: DC

## 2019-02-12 MED ORDER — PROPOFOL 10 MG/ML IV BOLUS
INTRAVENOUS | Status: DC | PRN
  Administered 2019-02-12: 40 mg via INTRAVENOUS

## 2019-02-12 MED ORDER — PROPOFOL 500 MG/50ML IV EMUL
INTRAVENOUS | Status: AC
  Filled 2019-02-12: qty 50

## 2019-02-12 MED ORDER — PROPOFOL 10 MG/ML IV BOLUS
INTRAVENOUS | Status: AC
  Filled 2019-02-12: qty 20

## 2019-02-12 MED ORDER — LACTATED RINGERS IV SOLN
INTRAVENOUS | Status: DC
  Administered 2019-02-12: 14:00:00 via INTRAVENOUS

## 2019-02-12 MED ORDER — PROPOFOL 500 MG/50ML IV EMUL
INTRAVENOUS | Status: DC | PRN
  Administered 2019-02-12: 125 ug/kg/min via INTRAVENOUS

## 2019-02-12 SURGICAL SUPPLY — 22 items

## 2019-02-12 NOTE — Discharge Instructions (Signed)

## 2019-02-12 NOTE — Op Note (Signed)
[facility] Patient Name: [name] Procedure Date: 02/12/2019 MRN: [id number] Attending MD: Wonda Horner , MD Date of Birth: Sep 01, 1950 CSN: [id number] Age: 68 Admit Type: Outpatient Procedure:                Colonoscopy Indications:              Rectal bleeding Providers:                Wonda Horner, MD, Debara Pickett., Technician, Dellie Catholic Referring MD:              Medicines:                Propofol per Anesthesia Complications:            No immediate complications. Estimated Blood Loss:     Estimated blood loss: none. Procedure:                Pre-Anesthesia Assessment:                           - Prior to the procedure, a History and Physical                            was performed, and patient medications and                            allergies were reviewed. The patient's tolerance of                            previous anesthesia was also reviewed. The risks                            and benefits of the procedure and the sedation                            options and risks were discussed with the patient.                            All questions were answered, and informed consent                            was obtained. Prior Anticoagulants: The patient has                            taken no previous anticoagulant or antiplatelet                            agents. ASA Grade Assessment: II - A patient with                            mild systemic disease. After reviewing the risks  and benefits, the patient was deemed in                            satisfactory condition to undergo the procedure.                           After obtaining informed consent, the colonoscope                            was passed under direct vision. Throughout the                            procedure, the patient's blood pressure, pulse, and                            oxygen  saturations were monitored continuously. The                            [id number] Olympus pediatric colonscope                            was introduced through the anus and advanced to the                            the cecum, identified by appendiceal orifice and                            ileocecal valve. The ileocecal valve, appendiceal                            orifice, and rectum were photographed. The                            colonoscopy was performed without difficulty. The                            patient tolerated the procedure well. The quality                            of the bowel preparation was good. Scope In: 2:05:44 PM Scope Out: 2:18:09 PM Scope Withdrawal Time: 0 hours 7 minutes 35 seconds  Total Procedure Duration: 0 hours 12 minutes 25 seconds  Findings:      The perianal and digital rectal examinations were normal.      A 8 mm polyp was found in the cecum. The polyp was sessile. The polyp       was removed with a hot snare. Resection and retrieval were complete.      Internal hemorrhoids were found during retroflexion. The hemorrhoids       were medium-sized. Impression:               - One 8 mm polyp in the cecum, removed with a hot                            snare. Resected and retrieved.                           -  Internal hemorrhoids. Moderate Sedation:      . Recommendation:           - Resume regular diet.                           - Continue present medications.                           - Await pathology results.                           - Repeat colonoscopy for surveillance based on                            pathology results. Procedure Code(s):        --- Professional ---                           [phone], Colonoscopy, flexible; with removal of                            tumor(s), polyp(s), or other lesion(s) by snare                            technique Diagnosis Code(s):        --- Professional ---                           K63.5,  Polyp of colon                           K64.8, Other hemorrhoids                           K62.5, Hemorrhage of anus and rectum CPT copyright 2019 American Medical Association. All rights reserved. The codes documented in this report are preliminary and upon coder review may  be revised to meet current compliance requirements. Wonda Horner, MD 02/12/2019 2:26:57 PM This report has been signed electronically. Number of Addenda: 0

## 2019-02-12 NOTE — H&P (Signed)
The patient is here in the endoscopy unit today to have a colonoscopy because of rectal bleeding.  No change in other history  Physical  No distress  Heart regular  No respiratory issues  Abdomen soft nontender  Impression rectal bleeding  Plan colonoscopy

## 2019-02-12 NOTE — Anesthesia Preprocedure Evaluation (Addendum)
Anesthesia Evaluation  Patient identified by MRN, date of birth, ID band Patient awake    Reviewed: Allergy & Precautions, H&P , NPO status , Patient's Chart, lab work & pertinent test results, reviewed documented beta blocker date and time   Airway Mallampati: I  TM Distance: >3 FB Neck ROM: full    Dental no notable dental hx. (+) Teeth Intact, Dental Advisory Given   Pulmonary neg pulmonary ROS,    Pulmonary exam normal breath sounds clear to auscultation       Cardiovascular Exercise Tolerance: Good hypertension, Pt. on home beta blockers + dysrhythmias Supra Ventricular Tachycardia  Rhythm:regular Rate:Normal     Neuro/Psych negative neurological ROS  negative psych ROS   GI/Hepatic Neg liver ROS, GERD  ,  Endo/Other  Hypothyroidism   Renal/GU negative Renal ROS  negative genitourinary   Musculoskeletal   Abdominal   Peds  Hematology negative hematology ROS (+)   Anesthesia Other Findings   Reproductive/Obstetrics negative OB ROS                             Anesthesia Physical Anesthesia Plan  ASA: II  Anesthesia Plan: MAC   Post-op Pain Management:    Induction: Intravenous  PONV Risk Score and Plan:   Airway Management Planned: Mask, Natural Airway and Nasal Cannula  Additional Equipment:   Intra-op Plan:   Post-operative Plan:   Informed Consent: I have reviewed the patients History and Physical, chart, labs and discussed the procedure including the risks, benefits and alternatives for the proposed anesthesia with the patient or authorized representative who has indicated his/her understanding and acceptance.     Dental Advisory Given  Plan Discussed with: CRNA, Anesthesiologist and Surgeon  Anesthesia Plan Comments:         Anesthesia Quick Evaluation

## 2019-02-12 NOTE — Transfer of Care (Signed)
Immediate Anesthesia Transfer of Care Note  Patient: [name]  Procedure(s) Performed: COLONOSCOPY WITH PROPOFOL (N/A ) POLYPECTOMY  Patient Location: Endoscopy Unit  Anesthesia Type:MAC  Level of Consciousness: awake and patient cooperative  Airway & Oxygen Therapy: Patient Spontanous Breathing and Patient connected to face mask  Post-op Assessment: Report given to RN and Post -op Vital signs reviewed and stable  Post vital signs: Reviewed and stable  Last Vitals:  Vitals Value Taken Time  BP    Temp    Pulse    Resp    SpO2      Last Pain:  Vitals:   02/12/19 1308  TempSrc: Oral         Complications: No apparent anesthesia complications

## 2019-02-15 ENCOUNTER — Encounter (HOSPITAL_COMMUNITY): Payer: Self-pay | Admitting: Gastroenterology

## 2019-02-15 LAB — SURGICAL PATHOLOGY

## 2019-02-15 NOTE — Anesthesia Postprocedure Evaluation (Signed)
Anesthesia Post Note  Patient: [name]  Procedure(s) Performed: COLONOSCOPY WITH PROPOFOL (N/A ) POLYPECTOMY     Patient location during evaluation: PACU Anesthesia Type: MAC Level of consciousness: awake and alert Pain management: pain level controlled Vital Signs Assessment: post-procedure vital signs reviewed and stable Respiratory status: spontaneous breathing Cardiovascular status: stable Anesthetic complications: no    Last Vitals:  Vitals:   02/12/19 1440 02/12/19 1450  BP: 122/74 128/81  Pulse: 66 60  Resp: 15 19  Temp:    SpO2: 96% 99%    Last Pain:  Vitals:   02/12/19 1450  TempSrc:   PainSc: 0-No pain                 Nolon Nations

## 2019-03-25 ENCOUNTER — Ambulatory Visit (INDEPENDENT_AMBULATORY_CARE_PROVIDER_SITE_OTHER): Payer: Medicare Other | Admitting: Neurology

## 2019-03-25 ENCOUNTER — Other Ambulatory Visit: Payer: Self-pay

## 2019-03-25 ENCOUNTER — Ambulatory Visit: Payer: Medicare Other | Admitting: Neurology

## 2019-03-25 DIAGNOSIS — G5601 Carpal tunnel syndrome, right upper limb: Secondary | ICD-10-CM

## 2019-03-25 DIAGNOSIS — R200 Anesthesia of skin: Secondary | ICD-10-CM

## 2019-03-25 DIAGNOSIS — G5602 Carpal tunnel syndrome, left upper limb: Secondary | ICD-10-CM | POA: Insufficient documentation

## 2019-03-25 DIAGNOSIS — R202 Paresthesia of skin: Secondary | ICD-10-CM

## 2019-03-25 DIAGNOSIS — Z0289 Encounter for other administrative examinations: Secondary | ICD-10-CM

## 2019-03-25 NOTE — Progress Notes (Signed)
See procedure note.

## 2019-03-25 NOTE — Progress Notes (Signed)
Patient with numbness and tingling left hand, s/p CTS release in 2014. The right hand is sometimes also affected. Chronic neck pain. She "lives in pain". She has a lot of shoulder pain. Tightness and sharp, sometimes it gives her a headache. Last MRI was in 2017. Left hand symptoms started August. The symptoms are episodic, more with laying on the sides. She feels the symptoms intermittently, 2-3x a week and it can last a few minutes in digits 1-4. Changing positions and stretching in the neck helps. The symptoms in the left leg are also episodic, changes with movement, associated with neck and back pain. We discussed, I do think this is cervical and lumbar radicular symptoms and recommend seeing Dr. Alfonso Ramus for [facility] or other procedures. The CTS in her right hand is asymptomatic can discuss with Dr. Alfonso Ramus and follow clinically.  She also hs a 57m/s drop across the left elbow of the ulnar motor nerve however she does not have any symptoms in the ulnar distribution, monitor.  Recommend f/u with Dr. Alfonso Ramus and we will send this result to him.   A total of 15 minutes was spent face-to-face with this patient. Over half this time was spent on counseling patient on the  1. Numbness and tingling in left hand   2. Carpal tunnel syndrome of right wrist   3. Numbness and tingling of left leg    diagnosis and different diagnostic and therapeutic options, counseling and coordination of care, risks ans benefits of management, compliance, or risk factor reduction and education.

## 2019-03-27 NOTE — Progress Notes (Signed)
Full Name: [name] Gender: Female MRN #: [id number] Date of Birth: March 29, 1951    Visit Date: 03/25/2019 07:41 Age: 68 Years Examining Physician: Sarina Ill, MD  Referring Physician: Cari Caraway, MD  History: Left arm and left leg sensory changes.  Very nice 68 year old patient with numbness and tingling in the left hand which is occasional and intermittent, status post carpal tunnel release in 2014, also intermittent occasional symptoms in the left leg.  Symptoms associated with neck pain and low back pain.  Summary: EMG nerve conduction study performed on the bilateral upper extremities and left lower extremity.  The left median motor nerve showed reduced amplitude (1.6 mV, normal greater than 4).  The right median motor nerve showed delayed distal onset latency (4.9 ms, normal less than 4.4).  The left ulnar motor nerve showed reduced amplitude (3.4 mV, normal greater than 6) with a 21 m/s drop in conduction velocity across the elbow.  The right median/ulnar (palm) comparison nerve showed prolonged distal peak latency (Median Palm, 2.8 ms, N<2.2) and abnormal peak latency difference (Median Palm-Ulnar Palm, 0.9 ms, N<0.4) with a relative median delay.    The right median orthodromic sensory nerve showed delayed distal peak latency (3.6 ms, normal less than 3.4). All remaining nerves (as indicated in the following tables) were within normal limits.  All muscles (as indicated in the following tables) were within normal limits.      Conclusion:  1.  The left median motor nerve showed abnormal conductions likely due to from remote injury or prior carpal tunnel syndrome status post release; no current evidence for acute or ongoing carpal tunnel syndrome. 2.  There is electrophysiologic evidence for moderate right-sided carpal tunnel syndrome that is largely asymptomatic.  Suggest conservative measures and follow clinically with Dr. Alfonso Ramus. 3.  There is a 21 m/s drop in  conduction velocity across the elbow of the left ulnar motor nerve which suggest ulnar neuropathy however clinical symptoms do not correlate with the ulnar distribution.  Suggest further evaluation and clinical correlation with Dr. Alfonso Ramus. 4.  EMG was negative for acute ongoing denervation however cannot rule out cervical radiculopathy given prior MRI cervical spine.  Recommend follow-up with Dr. Alfonso Ramus for [facility] or other procedures.    Sarina Ill M.D.  [facility] [place], [place] [address] Tel: [phone] Fax: [phone]   CC: Dr. Leonides Schanz, Dr. Alfonso Ramus      [facility]    Nerve / Sites Muscle Latency Ref. Amplitude Ref. Rel Amp Segments Distance Velocity Ref. Area    ms ms mV mV %  cm m/s m/s mVms  L Median - APB     Wrist APB 3.9 ?4.4 1.6 ?4.0 100 Wrist - APB 7   3.5     Upper arm APB 7.2  3.1  194 Upper arm - Wrist 23 69 ?49 10.7  R Median - APB     Wrist APB 4.9 ?4.4 5.0 ?4.0 100 Wrist - APB 7   15.3     Upper arm APB 9.0  5.2  104 Upper arm - Wrist 23 57 ?49 18.5  L Ulnar - ADM     Wrist ADM 2.9 ?3.3 3.4 ?6.0 100 Wrist - ADM 7   8.6     B.Elbow ADM 5.3  8.4  247 B.Elbow - Wrist 18 74 ?49 28.4     A.Elbow ADM 7.2  8.3  99.8 A.Elbow - B.Elbow 10 53 ?49 28.7  A.Elbow - Wrist      R Ulnar - ADM     Wrist ADM 2.4 ?3.3 8.1 ?6.0 100 Wrist - ADM 7   31.6     B.Elbow ADM 5.6  7.8  96.9 B.Elbow - Wrist 19 60 ?49 31.5     A.Elbow ADM 7.2  7.7  98.1 A.Elbow - B.Elbow 10 64 ?49 31.0         A.Elbow - Wrist      L Peroneal - EDB     Ankle EDB 5.2 ?6.5 4.9 ?2.0 100 Ankle - EDB 9   18.1     Fib head EDB 11.7  4.7  94.6 Fib head - Ankle 30 46 ?44 17.0     Pop fossa EDB 13.8  4.9  104 Pop fossa - Fib head 10 47 ?44 17.4         Pop fossa - Ankle      L Tibial - AH     Ankle AH 5.0 ?5.8 9.4 ?4.0 100 Ankle - AH 9   27.6     Pop fossa AH 13.5  7.5  80 Pop fossa - Ankle 37 43 ?41 27.3                    SNC    Nerve / Sites Rec. Site Peak Lat Ref.  Amp  Ref. Segments Distance Peak Diff Ref.    ms ms V V  cm ms ms  L Sural - Ankle (Calf)     Calf Ankle 3.4 ?4.4 28 ?6 Calf - Ankle 14    L Superficial peroneal - Ankle     Lat leg Ankle 3.8 ?4.4 6 ?6 Lat leg - Ankle 14    L Median, Ulnar - Transcarpal comparison     Median Palm Wrist 2.2 ?2.2 47 ?35 Median Palm - Wrist 8       Ulnar Palm Wrist 2.1 ?2.2 25 ?12 Ulnar Palm - Wrist 8          Median Palm - Ulnar Palm  0.1 ?0.4  R Median, Ulnar - Transcarpal comparison     Median Palm Wrist 2.8 ?2.2 42 ?35 Median Palm - Wrist 8       Ulnar Palm Wrist 1.9 ?2.2 11 ?12 Ulnar Palm - Wrist 8          Median Palm - Ulnar Palm  0.9 ?0.4  L Median - Orthodromic (Dig II, Mid palm)     Dig II Wrist 3.2 ?3.4 15 ?10 Dig II - Wrist 13    R Median - Orthodromic (Dig II, Mid palm)     Dig II Wrist 3.6 ?3.4 12 ?10 Dig II - Wrist 13    L Ulnar - Orthodromic, (Dig V, Mid palm)     Dig V Wrist 2.8 ?3.1 10 ?5 Dig V - Wrist 11    R Ulnar - Orthodromic, (Dig V, Mid palm)     Dig V Wrist 2.7 ?3.1 13 ?5 Dig V - Wrist 56                       F  Wave    Nerve F Lat Ref.   ms ms  L Ulnar - ADM 27.1 ?32.0  L Tibial - AH 48.2 ?56.0  R Ulnar - ADM 25.4 ?32.0           EMG Summary Table    Spontaneous MUAP Recruitment  Muscle IA Fib PSW Fasc Other Amp Dur. Poly Pattern  L. Deltoid Normal None None None _______ Normal Normal Normal Normal  L. Triceps brachii Normal None None None _______ Normal Normal Normal Normal  L. Pronator teres Normal None None None _______ Normal Normal Normal Normal  L. Flexor digitorum profundus (Ulnar) Normal None None None _______ Normal Normal Normal Normal  L. First dorsal interosseous Normal None None None _______ Normal Normal Normal Normal  L. Cervical paraspinals (low) Normal None None None _______ Normal Normal Normal Normal  L. Vastus medialis Normal None None None _______ Normal Normal Normal Normal  L. Tibialis anterior Normal None None None _______ Normal Normal Normal  Normal  L. Gastrocnemius (Medial head) Normal None None None _______ Normal Normal Normal Normal  L. Biceps femoris (long head) Normal None None None _______ Normal Normal Normal Normal  L. Gluteus maximus Normal None None None _______ Normal Normal Normal Normal  L. Gluteus medius Normal None None None _______ Normal Normal Normal Normal  L. Lumbar paraspinals (low) Normal None None None _______ Normal Normal Normal Normal

## 2019-03-30 NOTE — Procedures (Signed)
Full Name: [name] Gender: Female MRN #: [id number] Date of Birth: [birth year]    Visit Date: 03/25/2019 07:41 Age: 68 Years Examining Physician: Sarina Ill, MD  Referring Physician: Cari Caraway, MD  History: Left arm and left leg sensory changes.  Very nice 68 year old patient with numbness and tingling in the left hand which is occasional and intermittent, status post carpal tunnel release in 2014, also intermittent occasional symptoms in the left leg.  Symptoms associated with neck pain and low back pain.  Summary: EMG nerve conduction study performed on the bilateral upper extremities and left lower extremity.  The left median motor nerve showed reduced amplitude (1.6 mV, normal greater than 4).  The right median motor nerve showed delayed distal onset latency (4.9 ms, normal less than 4.4).  The left ulnar motor nerve showed reduced amplitude (3.4 mV, normal greater than 6) with a 21 m/s drop in conduction velocity across the elbow.  The right median/ulnar (palm) comparison nerve showed prolonged distal peak latency (Median Palm, 2.8 ms, N<2.2) and abnormal peak latency difference (Median Palm-Ulnar Palm, 0.9 ms, N<0.4) with a relative median delay.    The right median orthodromic sensory nerve showed delayed distal peak latency (3.6 ms, normal less than 3.4). All remaining nerves (as indicated in the following tables) were within normal limits.  All muscles (as indicated in the following tables) were within normal limits.      Conclusion:  1.  The left median motor nerve showed abnormal conductions likely due to from remote injury or prior carpal tunnel syndrome status post release; no current evidence for acute or ongoing carpal tunnel syndrome. 2.  There is electrophysiologic evidence for moderate right-sided carpal tunnel syndrome that is largely asymptomatic.  Suggest conservative measures and follow clinically with Dr. Alfonso Ramus. 3.  There is a 21 m/s drop in  conduction velocity across the elbow of the left ulnar motor nerve which suggest ulnar neuropathy however clinical symptoms do not correlate with the ulnar distribution.  Suggest further evaluation and clinical correlation with Dr. Alfonso Ramus. 4.  EMG was negative for acute ongoing denervation however cannot rule out cervical radiculopathy given prior MRI cervical spine.  Recommend follow-up with Dr. Alfonso Ramus for [facility] or other procedures.    Sarina Ill M.D.  [facility] [place], [place] [address] Tel: [phone] Fax: [phone]         [facility]    Nerve / Sites Muscle Latency Ref. Amplitude Ref. Rel Amp Segments Distance Velocity Ref. Area    ms ms mV mV %  cm m/s m/s mVms  L Median - APB     Wrist APB 3.9 ?4.4 1.6 ?4.0 100 Wrist - APB 7   3.5     Upper arm APB 7.2  3.1  194 Upper arm - Wrist 23 69 ?49 10.7  R Median - APB     Wrist APB 4.9 ?4.4 5.0 ?4.0 100 Wrist - APB 7   15.3     Upper arm APB 9.0  5.2  104 Upper arm - Wrist 23 57 ?49 18.5  L Ulnar - ADM     Wrist ADM 2.9 ?3.3 3.4 ?6.0 100 Wrist - ADM 7   8.6     B.Elbow ADM 5.3  8.4  247 B.Elbow - Wrist 18 74 ?49 28.4     A.Elbow ADM 7.2  8.3  99.8 A.Elbow - B.Elbow 10 53 ?49 28.7  A.Elbow - Wrist      R Ulnar - ADM     Wrist ADM 2.4 ?3.3 8.1 ?6.0 100 Wrist - ADM 7   31.6     B.Elbow ADM 5.6  7.8  96.9 B.Elbow - Wrist 19 60 ?49 31.5     A.Elbow ADM 7.2  7.7  98.1 A.Elbow - B.Elbow 10 64 ?49 31.0         A.Elbow - Wrist      L Peroneal - EDB     Ankle EDB 5.2 ?6.5 4.9 ?2.0 100 Ankle - EDB 9   18.1     Fib head EDB 11.7  4.7  94.6 Fib head - Ankle 30 46 ?44 17.0     Pop fossa EDB 13.8  4.9  104 Pop fossa - Fib head 10 47 ?44 17.4         Pop fossa - Ankle      L Tibial - AH     Ankle AH 5.0 ?5.8 9.4 ?4.0 100 Ankle - AH 9   27.6     Pop fossa AH 13.5  7.5  80 Pop fossa - Ankle 37 43 ?41 27.3                    SNC    Nerve / Sites Rec. Site Peak Lat Ref.  Amp Ref. Segments Distance Peak  Diff Ref.    ms ms V V  cm ms ms  L Sural - Ankle (Calf)     Calf Ankle 3.4 ?4.4 28 ?6 Calf - Ankle 14    L Superficial peroneal - Ankle     Lat leg Ankle 3.8 ?4.4 6 ?6 Lat leg - Ankle 14    L Median, Ulnar - Transcarpal comparison     Median Palm Wrist 2.2 ?2.2 47 ?35 Median Palm - Wrist 8       Ulnar Palm Wrist 2.1 ?2.2 25 ?12 Ulnar Palm - Wrist 8          Median Palm - Ulnar Palm  0.1 ?0.4  R Median, Ulnar - Transcarpal comparison     Median Palm Wrist 2.8 ?2.2 42 ?35 Median Palm - Wrist 8       Ulnar Palm Wrist 1.9 ?2.2 11 ?12 Ulnar Palm - Wrist 8          Median Palm - Ulnar Palm  0.9 ?0.4  L Median - Orthodromic (Dig II, Mid palm)     Dig II Wrist 3.2 ?3.4 15 ?10 Dig II - Wrist 13    R Median - Orthodromic (Dig II, Mid palm)     Dig II Wrist 3.6 ?3.4 12 ?10 Dig II - Wrist 13    L Ulnar - Orthodromic, (Dig V, Mid palm)     Dig V Wrist 2.8 ?3.1 10 ?5 Dig V - Wrist 11    R Ulnar - Orthodromic, (Dig V, Mid palm)     Dig V Wrist 2.7 ?3.1 13 ?5 Dig V - Wrist 76                       F  Wave    Nerve F Lat Ref.   ms ms  L Ulnar - ADM 27.1 ?32.0  L Tibial - AH 48.2 ?56.0  R Ulnar - ADM 25.4 ?32.0           EMG Summary Table    Spontaneous MUAP Recruitment  Muscle IA Fib PSW Fasc Other Amp Dur. Poly Pattern  L. Deltoid Normal None None None _______ Normal Normal Normal Normal  L. Triceps brachii Normal None None None _______ Normal Normal Normal Normal  L. Pronator teres Normal None None None _______ Normal Normal Normal Normal  L. Flexor digitorum profundus (Ulnar) Normal None None None _______ Normal Normal Normal Normal  L. First dorsal interosseous Normal None None None _______ Normal Normal Normal Normal  L. Cervical paraspinals (low) Normal None None None _______ Normal Normal Normal Normal  L. Vastus medialis Normal None None None _______ Normal Normal Normal Normal  L. Tibialis anterior Normal None None None _______ Normal Normal Normal Normal  L. Gastrocnemius  (Medial head) Normal None None None _______ Normal Normal Normal Normal  L. Biceps femoris (long head) Normal None None None _______ Normal Normal Normal Normal  L. Gluteus maximus Normal None None None _______ Normal Normal Normal Normal  L. Gluteus medius Normal None None None _______ Normal Normal Normal Normal  L. Lumbar paraspinals (low) Normal None None None _______ Normal Normal Normal Normal

## 2019-06-21 ENCOUNTER — Other Ambulatory Visit: Payer: Self-pay

## 2019-06-21 MED ORDER — METOPROLOL SUCCINATE ER 50 MG PO TB24: 50.0000 mg | ORAL_TABLET | Freq: Every day | ORAL | 0 refills | Status: DC

## 2019-06-29 ENCOUNTER — Encounter (HOSPITAL_COMMUNITY): Payer: Self-pay

## 2019-06-29 ENCOUNTER — Emergency Department (HOSPITAL_COMMUNITY)
Admission: EM | Admit: 2019-06-29 | Discharge: 2019-06-29 | Disposition: A | Discharge status: Home / Self Care | Payer: Medicare Other | Attending: Emergency Medicine | Admitting: Emergency Medicine

## 2019-06-29 DIAGNOSIS — R519 Headache, unspecified: Secondary | ICD-10-CM | POA: Diagnosis not present

## 2019-06-29 DIAGNOSIS — R2232 Localized swelling, mass and lump, left upper limb: Secondary | ICD-10-CM | POA: Diagnosis not present

## 2019-06-29 DIAGNOSIS — R002 Palpitations: Secondary | ICD-10-CM | POA: Insufficient documentation

## 2019-06-29 DIAGNOSIS — L298 Other pruritus: Secondary | ICD-10-CM | POA: Insufficient documentation

## 2019-06-29 DIAGNOSIS — T7840XA Allergy, unspecified, initial encounter: Secondary | ICD-10-CM | POA: Insufficient documentation

## 2019-06-29 MED ORDER — SODIUM CHLORIDE 0.9 % IV BOLUS
1000.0000 mL | Freq: Once | INTRAVENOUS | Status: AC
  Administered 2019-06-29: 1000 mL via INTRAVENOUS

## 2019-06-29 NOTE — ED Provider Notes (Signed)
I received the patient in signout from Dr. Sherry Ruffing.  Briefly the patient is a 69 year old female with a chief complaint of a headache post her second coronavirus vaccination.  Still has a mild headache on my reassessment though at this time she is requesting discharge home.  We will have her follow-up with her family doctor.   Deno Etienne, DO 06/29/19 1724

## 2019-06-29 NOTE — Discharge Instructions (Signed)
Please return for worsening symptoms or if you are concerned about difficulty breathing vomiting or rash.  Please follow-up with your family doctor.

## 2019-06-29 NOTE — ED Provider Notes (Signed)
[facility] DEPT Provider Note   CSN: [id number] Arrival date & time: 06/29/19  1405     History Chief Complaint  Patient presents with  . Allergic Reaction    [name] is a 69 y.o. female.  The history is provided by the patient and medical records. No language interpreter was used.  Allergic Reaction Presenting symptoms: difficulty breathing (improived), itching and swelling   Presenting symptoms: no rash and no wheezing   Severity:  Moderate Prior allergic episodes:  No prior episodes Context: medications   Relieved by:  Nothing Worsened by:  Nothing Ineffective treatments:  None tried      Past Medical History:  Diagnosis Date  . Arthritis    Neck  . CIN I (cervical intraepithelial neoplasia I) 2017  . Complication of anesthesia    delayed SVT:  can occur from a few hours to 3 days after the general anesthesia or conscious sedation as happened with her colonoscopy.   [name] Dysplasia of cervix, low grade (CIN 1)    LEEP cervical conization September 2017 margins negative CIN-1  . GERD (gastroesophageal reflux disease)   . Hashimoto's thyroiditis   . History of carpal tunnel syndrome    Left  . History of colon polyps 2010   Benign  . Hyperlipidemia   . Hypothyroidism    Hashimoto's per pt   . Osteopenia 10/2017   T score -1.9 FRAX 8.8% / 0.7%.  Stable from prior DEXA.  [name] SVT (supraventricular tachycardia) ([place])   . Wears glasses     Patient Active Problem List   Diagnosis Date Noted  . Carpal tunnel syndrome of left wrist 03/25/2019  . Abnormal CT of the abdomen 02/25/2018  . Abdominal pain, right upper quadrant 02/25/2018  . Abnormal finding on radiology exam 02/25/2018  . Allergic rhinitis 02/25/2018  . Brash 02/25/2018  . CN (constipation) 02/25/2018  . LBP (low back pain) 02/25/2018  . Combined form of age-related cataract, both eyes 04/07/2017  . Posterior vitreous detachment of right eye 04/07/2017  .  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 CIN I (cervical intraepithelial neoplasia I) 01/02/2016  . Preoperative cardiovascular examination 11/10/2015  . ASCUS with positive high risk HPV cervical 12/13/2014  . Eyelid retraction 12/02/2014  . Dry eye syndrome of bilateral lacrimal glands 12/01/2014  . 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 Other disorders affecting eyelid function 12/01/2014  . 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 Peripheral visual field defect of both eyes 12/01/2014  . Age-related nuclear cataract of both eyes 09/06/2014  . Anatomical narrow angle of both eyes 09/06/2014  . Hx of Hashimoto thyroiditis 09/06/2014  . Keratoconjunctivitis sicca of both eyes not specified as Sjogren's 09/06/2014  . Meibomian gland dysfunction (MGD) of upper and lower lids of both eyes 09/06/2014  . Abnormal Papanicolaou smear of cervix with positive human papilloma virus (HPV) test 06/27/2014  . Hypokalemia 03/04/2014  . History of paroxysmal supraventricular tachycardia 02/25/2014  . Osteopenia 04/30/2013  . Dysphonia 04/05/2013  . Laryngopharyngeal reflux 04/05/2013  . Hashimoto's thyroiditis 03/29/2013  . 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 Hypercalcemia 05/25/2012  . Hypothyroidism 04/27/2012  . Hyperlipidemia 04/27/2012  . Paroxysmal supraventricular tachycardia ([place]) 04/27/2012  . Tachycardia 01/22/2012  . Endometrial polyp 12/04/2010  . Fibroid, uterus 11/27/2010  . Acquired hypothyroidism 01/17/2010  . Other and unspecified hyperlipidemia 02/01/2008  . Other specified cardiac dysrhythmias(427.89) 12/07/2007    Past Surgical History:  Procedure Laterality Date  .  BREAST SURGERY  1976   CYST REMOVED  . CARDIAC ELECTROPHYSIOLOGY STUDY AND ABLATION  2001  . CESAREAN SECTION  1987  . COLONOSCOPY    . COLONOSCOPY WITH PROPOFOL N/A 02/12/2019   Procedure: COLONOSCOPY WITH PROPOFOL;  Surgeon: Wonda Horner, MD;  Location: [facility]  ENDOSCOPY;  Service: Endoscopy;  Laterality: N/A;  . LEEP  2017  . left thumb surgery and Carpal Tunnel Release  9/14  . lower lid surgery   09/2018   dry eye from Hashimoto's  . POLYPECTOMY  02/12/2019   Procedure: POLYPECTOMY;  Surgeon: Wonda Horner, MD;  Location: [facility] ENDOSCOPY;  Service: Endoscopy;;     OB History    Gravida  1   Para  1   Term  1   Preterm      AB      Living  1     SAB      TAB      Ectopic      Multiple      Live Births              Family History  Problem Relation Age of Onset  . Hypertension Mother   . Heart disease Mother   . Mitral valve prolapse Mother   . Glaucoma Mother   . Arthritis Mother   . Von Willebrand disease Mother   . Stroke Mother   . Rheum arthritis Mother   . Neuropathy Mother   . Hypertension Father   . Heart disease Father   . Colon polyps Father   . Glaucoma Father   . Arthritis Father   . Stroke Father   . Rheum arthritis Father   . Pulmonary embolism Father   . Hypertension Brother   . Mitral valve prolapse Brother   . Glaucoma Brother   . Arthritis Brother   . Arthritis Sister     Social History   Tobacco Use  . Smoking status: Never Smoker  . Smokeless tobacco: Never Used  Substance Use Topics  . Alcohol use: Not Currently  . Drug use: No    Home Medications Prior to Admission medications   Medication Sig Start Date End Date Taking? Authorizing Provider  carboxymethylcellulose (REFRESH PLUS) 0.5 % SOLN Place 1 drop into both eyes 4 (four) times daily as needed (dry eyes).    [provider]  Cholecalciferol (VITAMIN D-3) 5000 units TABS Take 5,000 mg by mouth See admin instructions. Monday and Friday    [provider]  fish oil-omega-3 fatty acids 1000 MG capsule Take 1 g by mouth daily.    [provider]  fluticasone (FLONASE) 50 MCG/ACT nasal spray Place 1 spray into both nostrils as needed for allergies.  02/10/18   [provider]    hydrocortisone (ANUSOL-HC) 25 MG suppository Place 1 suppository (25 mg total) rectally 2 (two) times daily. Patient taking differently: Place 25 mg rectally 2 (two) times daily as needed for hemorrhoids.  01/05/19   [name], PA-C  levothyroxine (SYNTHROID, LEVOTHROID) 75 MCG tablet Take 75 mcg by mouth daily before breakfast.    [provider]  lidocaine (XYLOCAINE) 5 % ointment Apply 1 application topically 2 (two) times daily as needed. Use a pea size amount only as needed. Patient taking differently: Apply 1 application topically 2 (two) times daily as needed for mild pain (hemorrids). Use a pea size amount only as needed. 01/05/19   [name], PA-C  metoprolol succinate (TOPROL-XL) 50 MG 24 hr tablet Take  1 tablet (50 mg total) by mouth daily. Take with or immediately following a meal. 06/21/19   Camnitz, Ocie Doyne, MD  Multiple Vitamin (MULTIVITAMIN WITH MINERALS) TABS tablet Take 1 tablet by mouth daily.    [provider]  omega-3 acid ethyl esters (LOVAZA) 1 g capsule Take 1 g by mouth daily.    [provider]  polyethylene glycol (MIRALAX / GLYCOLAX) packet Take 17 g by mouth daily as needed for mild constipation.     [provider]  Probiotic Product (PROBIOTIC ADVANCED PO) Take 1 capsule by mouth daily.     [provider]  rosuvastatin (CRESTOR) 10 MG tablet Take 10 mg by mouth 3 (three) times a week. Every Monday, Wednesday, and Friday in the evenings.    [provider]    Allergies    Lipitor [atorvastatin], Diphenhydramine, and Epinephrine  Review of Systems   Review of Systems  Constitutional: Negative for chills, diaphoresis, fatigue and fever.  HENT: Negative for congestion.   Eyes: Negative for visual disturbance.  Respiratory: Negative for cough, chest tightness, shortness of breath and wheezing.   Cardiovascular: Negative for chest pain, palpitations and leg swelling.  Gastrointestinal: Negative for abdominal  pain, constipation, diarrhea, nausea and vomiting.  Genitourinary: Negative for dysuria, flank pain and frequency.  Musculoskeletal: Negative for back pain, neck pain and neck stiffness.  Skin: Positive for itching. Negative for rash and wound.  Neurological: Negative for dizziness, syncope, speech difficulty, weakness, light-headedness, numbness and headaches.  Psychiatric/Behavioral: Negative for agitation and confusion.  All other systems reviewed and are negative.   Physical Exam Updated Vital Signs BP 136/78 (BP Location: Right Arm)   Pulse (!) 59   Temp 98.3 F (36.8 C)   Resp 17   Ht 5\' 3"  (1.6 m)   Wt 76.2 kg   LMP 11/26/2000   SpO2 100%   BMI 29.76 kg/m   Physical Exam Vitals and nursing note reviewed.  Constitutional:      General: She is not in acute distress.    Appearance: Normal appearance. She is well-developed. She is not ill-appearing, toxic-appearing or diaphoretic.  HENT:     Head: Normocephalic and atraumatic.     Right Ear: External ear normal.     Left Ear: External ear normal.     Nose: Nose normal. No congestion or rhinorrhea.     Mouth/Throat:     Mouth: Mucous membranes are moist.     Pharynx: Oropharynx is clear. No oropharyngeal exudate or posterior oropharyngeal erythema.  Eyes:     Extraocular Movements: Extraocular movements intact.     Conjunctiva/sclera: Conjunctivae normal.     Pupils: Pupils are equal, round, and reactive to light.  Cardiovascular:     Rate and Rhythm: Normal rate.     Pulses: Normal pulses.     Heart sounds: No murmur.  Pulmonary:     Effort: Pulmonary effort is normal. No respiratory distress.     Breath sounds: No stridor. No wheezing, rhonchi or rales.  Chest:     Chest wall: No tenderness.  Abdominal:     General: Abdomen is flat. There is no distension.     Tenderness: There is no abdominal tenderness. There is no right CVA tenderness, left CVA tenderness or rebound.  Musculoskeletal:        General: No  swelling, tenderness or signs of injury.     Cervical back: Normal range of motion and neck supple. No tenderness.     Right  lower leg: No edema.     Left lower leg: No edema.  Skin:    General: Skin is warm.     Capillary Refill: Capillary refill takes less than 2 seconds.     Coloration: Skin is not pale.     Findings: No erythema or rash.  Neurological:     General: No focal deficit present.     Mental Status: She is alert and oriented to person, place, and time.     Cranial Nerves: No cranial nerve deficit.     Sensory: No sensory deficit.     Motor: No weakness or abnormal muscle tone.     Coordination: Coordination normal.     Deep Tendon Reflexes: Reflexes are normal and symmetric.  Psychiatric:        Mood and Affect: Mood normal.     ED Results / Procedures / Treatments   Labs (all labs ordered are listed, but only abnormal results are displayed) Labs Reviewed - No data to display  EKG None  Radiology No results found.  Procedures Procedures (including critical care time)  Medications Ordered in ED Medications  sodium chloride 0.9 % bolus 1,000 mL (1,000 mLs Intravenous New Bag/Given 06/29/19 1456)    ED Course  I have reviewed the triage vital signs and the nursing notes.  Pertinent labs & imaging results that were available during my care of the patient were reviewed by me and considered in my medical decision making (see chart for details).    MDM Rules/Calculators/A&P                      Rhonna Drugan is a 69 y.o. female with a past medical history significant for GERD, Hashimoto's thyroiditis, hyperlipidemia, and recurrent episodes of SVT who presents with allergic reaction.  Patient reports that [place] [facility] [place] [facility] and [place] started having allergic reaction.  She says that her initial dose 3 weeks ago caused her to have some tingling all over her body but went away after several minutes.   She did report that she had to have EMS called on her and she was observed for a longer period of time after the first shot.   She reports that shortly after the injection today, her arm started swelling, she developed itching, and she felt that she was starting to go into SVT by feeling palpitations.  She reports that she has had intolerance to epinephrine and Benadryl in the past because everything "throws her into SVT".  She reports that she also cannot get steroids without going into SVT.  Her endocrinologist is told her especially to avoid these medications.  She reports that she has not had allergic reactions like this in the past and reports she was doing well before the injection.  She says that her heart rate was increased and her blood pressure was in the 180s during transport to the hospital but since arriving has had improvement.  She denies any throat swelling or lip swelling.  She does not feel her tongue is warm.  She denies difficulty breathing at this time.  She denies any nausea or vomiting or any new rash.  She reports that the swelling in her arm has decreased.  She denies new pain.  On exam, lungs are clear with no wheezing.  Chest and abdomen are nontender.  Patient has some tender area on her left deltoid which is where she had the second injection.  Good grip strength, sensation, and pulses.  No focal neurologic deficits.  Blood pressure is now in the 120s and her heart rate is in the 60s.  Her oxygen saturation is 100% on room air.  There is no stridor.  No evidence of oropharyngeal edema or erythema on my initial exam.  Patient resting comfortably.  Had a long conversation with the patient about work-up and management.  We told her that typically we would do antihistamines, steroids, and if she had airway problems, would consider epinephrine however patient would like to hold on all medications aside from fluids at this time.  Given her improving symptoms, she did want to avoid  steroids and antihistamines if possible.  We will intentionally not give her medications and give her fluids while we watch her for several hours to make sure she does not have recurrent reaction.  As this is likely from the Covid vaccine, she was instructed to not take any further Covid vaccines until she is evaluated by her endocrinologist and an allergist.  Care will be signed out to oncoming team while awaiting reassessment after a period of observation for worsened reaction.  If she starts getting any allergic reaction, would consider steroids and antihistamines initially.  Patient is agreeable this plan and is not in SVT on arrival.  Anticipate reassessment and discharge after monitoring.   Care transferred to oncoming team while awaiting further monitoring for recurrent allergic reaction.  If no allergic reaction is seen and she still feels well and passes a p.o. challenge, anticipate discharge home in several hours.    Final Clinical Impression(s) / ED Diagnoses Final diagnoses:  Allergic reaction, initial encounter     Clinical Impression: 1. Allergic reaction, initial encounter     Disposition: Care transferred to oncoming team while awaiting reassessment for allergic reaction.  Anticipate discharge home.  This note was prepared with assistance of Systems analyst. Occasional wrong-word or sound-a-like substitutions may have occurred due to the inherent limitations of voice recognition software.     Alveta Quintela, Gwenyth Allegra, MD 06/29/19 1710

## 2019-06-29 NOTE — ED Triage Notes (Addendum)
Pt BIBA from [place] site at [facility]. Pt receiving 2nd [place] shot today.  Nurse injected shot and arm started to swell. Pt c/o headche, dizziness at that time. No airway constriction. Pt c/o arm numbness that has started to resolve.  Pt in NAD at this time.  Pt was hypertensive on EMS arrival 180/100. Now at 139/76. 12 lead unremarkable.   Pt allergic to benadryl and epi, pt goes into SVT when given epi.

## 2019-08-02 ENCOUNTER — Other Ambulatory Visit: Payer: Self-pay | Admitting: Cardiology

## 2019-08-10 ENCOUNTER — Other Ambulatory Visit: Payer: Self-pay

## 2019-08-10 ENCOUNTER — Ambulatory Visit: Payer: Medicare Other | Admitting: Cardiology

## 2019-08-10 ENCOUNTER — Encounter: Payer: Self-pay | Admitting: Cardiology

## 2019-08-10 VITALS — BP 128/80 | HR 59 | Ht 63.0 in | Wt 171.2 lb

## 2019-08-10 DIAGNOSIS — I471 Supraventricular tachycardia: Secondary | ICD-10-CM

## 2019-08-10 MED ORDER — METOPROLOL SUCCINATE ER 50 MG PO TB24: 50.0000 mg | ORAL_TABLET | Freq: Every day | ORAL | 3 refills | Status: DC

## 2019-08-10 NOTE — Progress Notes (Signed)
Electrophysiology Office Note   Date:  08/10/2019   ID:  [name], DOB 15-Sep-1950, MRN [id number]  PCP:  Jessica Caraway, MD  Cardiologist:  [name] Primary Electrophysiologist:  [name] Jessica Leeds, MD    No chief complaint on file.    History of Present Illness: [name] is a 69 y.o. female who presents today for electrophysiology evaluation.   Hx essential hypertension, HLD and a history of palpitations and tachycardia.  She has had a long history of paroxysmal supraventricular tachycardia. She states that she had an ablation for SVT at [facility] in 2001. Since then she has had infrequent episodes of SVT. Many of her episodes have occurred following surgical procedures. She has had an echocardiogram on 02/02/13 which showed normal left ventricular function with ejection fraction 55-60% and no significant abnormalities. She also has a history of Hashimoto's thyroiditis which is followed by her PCP.   She apparently has delayed SVT after she gets anesthesia. This occurs with both conscious sedation and with general anesthesia.  This can occur from a few hours to 3 days after the general anesthesia or conscious sedation as happened with her colonoscopy.   Today, denies symptoms of palpitations, chest pain, shortness of breath, orthopnea, PND, lower extremity edema, claudication, dizziness, presyncope, syncope, bleeding, or neurologic sequela. The patient is tolerating medications without difficulties.  She has been doing well.  She does say that her lipids were quite out of balance, and unfortunately she cannot tolerate Crestor or other statins at higher doses.  She does say that she had a few palpitations around her second dose of the Covid vaccination.  She had a subcu instead of intramuscular dose.  Other than that she has done well without complaint.  Past Medical History:  Diagnosis Date  . Arthritis    Neck  . CIN I (cervical intraepithelial  neoplasia I) 2017  . Complication of anesthesia    delayed SVT:  can occur from a few hours to 3 days after the general anesthesia or conscious sedation as happened with her colonoscopy.   [name] Dysplasia of cervix, low grade (CIN 1)    LEEP cervical conization September 2017 margins negative CIN-1  . GERD (gastroesophageal reflux disease)   . Hashimoto's thyroiditis   . History of carpal tunnel syndrome    Left  . History of colon polyps 2010   Benign  . Hyperlipidemia   . Hypothyroidism    Hashimoto's per pt   . Osteopenia 10/2017   T score -1.9 FRAX 8.8% / 0.7%.  Stable from prior DEXA.  [name] SVT (supraventricular tachycardia) ([place])   . Wears glasses    Past Surgical History:  Procedure Laterality Date  . BREAST SURGERY  1976   CYST REMOVED  . CARDIAC ELECTROPHYSIOLOGY STUDY AND ABLATION  2001  . CESAREAN SECTION  1987  . COLONOSCOPY    . COLONOSCOPY WITH PROPOFOL N/A 02/12/2019   Procedure: COLONOSCOPY WITH PROPOFOL;  Surgeon: Wonda Horner, MD;  Location: [facility] ENDOSCOPY;  Service: Endoscopy;  Laterality: N/A;  . LEEP  2017  . left thumb surgery and Carpal Tunnel Release  9/14  . lower lid surgery   09/2018   dry eye from Hashimoto's  . POLYPECTOMY  02/12/2019   Procedure: POLYPECTOMY;  Surgeon: Wonda Horner, MD;  Location: [facility]OS[facility];  Service: Endoscopy;;     Current Outpatient Medications  Medication Sig Dispense Refill  . Cholecalciferol (VITAMIN D-3) 5000 units TABS Take 5,000 mg by mouth See admin instructions.  Monday and Friday    . fish oil-omega-3 fatty acids 1000 MG capsule Take 1 g by mouth daily.    . fluticasone (FLONASE) 50 MCG/ACT nasal spray Place 1 spray into both nostrils as needed for allergies.     . hydrocortisone (ANUSOL-HC) 25 MG suppository Place 1 suppository (25 mg total) rectally 2 (two) times daily. (Patient taking differently: Place 25 mg rectally 2 (two) times daily as needed for hemorrhoids. ) 20 suppository 0  . levothyroxine (SYNTHROID,  LEVOTHROID) 75 MCG tablet Take 75 mcg by mouth daily before breakfast.    . metoprolol succinate (TOPROL-XL) 50 MG 24 hr tablet Take 1 tablet (50 mg total) by mouth daily. Take with or immediately following a meal. 90 tablet 3  . Multiple Vitamin (MULTIVITAMIN WITH MINERALS) TABS tablet Take 1 tablet by mouth daily.    . polyethylene glycol (MIRALAX / GLYCOLAX) packet Take 17 g by mouth daily as needed for mild constipation.     . Probiotic Product (PROBIOTIC ADVANCED PO) Take 1 capsule by mouth daily.     . rosuvastatin (CRESTOR) 10 MG tablet Take 10 mg by mouth 3 (three) times a week. Every Monday, Wednesday, and Friday in the evenings.     No current facility-administered medications for this visit.    Allergies:   Lidocaine, Lipitor [atorvastatin], Diphenhydramine, and Epinephrine   Social History:  The patient  reports that she has never smoked. She has never used smokeless tobacco. She reports previous alcohol use. She reports that she does not use drugs.   Family History:  The patient's family history includes Arthritis in her brother, father, mother, and sister; Colon polyps in her father; Glaucoma in her brother, father, and mother; Heart disease in her father and mother; Hypertension in her brother, father, and mother; Mitral valve prolapse in her brother and mother; Neuropathy in her mother; Pulmonary embolism in her father; Rheum arthritis in her father and mother; Stroke in her father and mother; Von Willebrand disease in her mother.    ROS:  Please see the history of present illness.   Otherwise, review of systems is positive for none.   All other systems are reviewed and negative.   PHYSICAL EXAM: VS:  BP 128/80   Pulse (!) 59   Ht 5\' 3"  (1.6 m)   Wt 171 lb 3.2 oz (77.7 kg)   LMP 11/26/2000   SpO2 97%   BMI 30.33 kg/m  , BMI Body mass index is 30.33 kg/m. GEN: Well nourished, well developed, in no acute distress  HEENT: normal  Neck: no JVD, carotid bruits, or  masses Cardiac: RRR; no murmurs, rubs, or gallops,no edema  Respiratory:  clear to auscultation bilaterally, normal work of breathing GI: soft, nontender, nondistended, + BS MS: no deformity or atrophy  Skin: warm and dry Neuro:  Strength and sensation are intact Psych: euthymic mood, full affect  EKG:  EKG is ordered today. Personal review of the ekg ordered shows sinus rhythm, rate 59  Recent Labs: 01/05/2019: Hemoglobin 13.6; Platelets 205    Lipid Panel     Component Value Date/Time   CHOL 205 (H) 12/27/2014 1055   TRIG 129 12/27/2014 1055   HDL 60 12/27/2014 1055   CHOLHDL 3.4 12/27/2014 1055   VLDL 26 12/27/2014 1055   LDLCALC 119 12/27/2014 1055     Wt Readings from Last 3 Encounters:  08/10/19 171 lb 3.2 oz (77.7 kg)  06/29/19 168 lb (76.2 kg)  02/12/19 159 lb (72.1 kg)  Other studies Reviewed: Additional studies/ records that were reviewed today include: TTE 2014  Review of the above records today demonstrates:  Left ventricle: The cavity size was normal. Systolic function was normal. The estimated ejection fraction was in the range of 55% to 60%. Wall motion was normal; there were no regional wall motion abnormalities.  Personal review of event monitor 2015 shows sinus rhythm with occasional APCs.  Cardiac monitor 01/26/18 - personally reviewed  Normal sinus rhythm, sinus bradycardia and sinus tachycardia with average heart rate 73 bpm. Heart rate ranged from 42 to 185 bpm.  Occasional PVCs with PVC burden 1%.  Nonsustained atrial tachycardia up to 13 beats.    ASSESSMENT AND PLAN:  1.  SVT: Status post ablation 2001.  Currently on metoprolol.  No further episodes.  We Letha Mirabal continue her metoprolol.  2. Hypertension: Currently well controlled  3.  Hyperlipidemia: She is currently on Crestor.  She is having quite a few issues with muscle aches and pains on higher doses and she tells me that her cholesterol has been out of control.  I would discuss  with is her primary physician to see if she would like [place] to get her into a lipid clinic.   Current medicines are reviewed at length with the patient today.   The patient does not have concerns regarding her medicines.  The following changes were made today: none  Labs/ tests ordered today include:  Orders Placed This Encounter  Procedures  . EKG 12-Lead     Disposition:   FU with Ondrea Dow 12 months  Signed, Ersa Delaney Jessica Leeds, MD  08/10/2019 3:42 PM     [address] [place] [address] [phone] (office) [phone] (fax)

## 2019-08-12 ENCOUNTER — Ambulatory Visit
Admission: EM | Admit: 2019-08-12 | Discharge: 2019-08-12 | Disposition: A | Discharge status: Home / Self Care | Payer: Medicare Other | Attending: Emergency Medicine | Admitting: Emergency Medicine

## 2019-08-12 ENCOUNTER — Ambulatory Visit
Admission: RE | Admit: 2019-08-12 | Discharge: 2019-08-12 | Disposition: A | Discharge status: Home / Self Care | Payer: Medicare Other | Source: Ambulatory Visit | Attending: Emergency Medicine | Admitting: Emergency Medicine

## 2019-08-12 ENCOUNTER — Other Ambulatory Visit: Payer: Self-pay

## 2019-08-12 DIAGNOSIS — M79669 Pain in unspecified lower leg: Secondary | ICD-10-CM | POA: Insufficient documentation

## 2019-08-12 DIAGNOSIS — I8001 Phlebitis and thrombophlebitis of superficial vessels of right lower extremity: Secondary | ICD-10-CM | POA: Insufficient documentation

## 2019-08-12 DIAGNOSIS — M79661 Pain in right lower leg: Secondary | ICD-10-CM

## 2019-08-12 NOTE — ED Triage Notes (Signed)
Patient complains of right calf pain that started around 4 days ago. Patient states that pain is worse in the morning and sore to touch.

## 2019-08-12 NOTE — Discharge Instructions (Addendum)
Go to [facility] for the ultrasound of your right calf.  I will call you with the results.

## 2019-08-12 NOTE — ED Provider Notes (Signed)
[facility] VIDEO VISITS    CSN: [id number] Arrival date & time: 08/12/19  1520      History   Chief Complaint Chief Complaint  Patient presents with  . Leg Pain    right    HPI [name] is a 69 y.o. female.   Patient presents with 4-day history of right calf pain.  She states that pain is 5/10 and worse with walking.  She feels a small knot in her calf.  No falls or injury.  She denies numbness, tingling, weakness in LE.  No rash, lesions, redness, increased warmth, or other symptoms.  She denies smoking, hormone use, prolonged inactivity, or recent surgery.  Patient has history of thrombophlebitis in LE.  She reports family history of thrombosis.  No treatment attempted at home.    The history is provided by the patient.    Past Medical History:  Diagnosis Date  . Arthritis    Neck  . CIN I (cervical intraepithelial neoplasia I) 2017  . Complication of anesthesia    delayed SVT:  can occur from a few hours to 3 days after the general anesthesia or conscious sedation as happened with her colonoscopy.   [name] Dysplasia of cervix, low grade (CIN 1)    LEEP cervical conization September 2017 margins negative CIN-1  . GERD (gastroesophageal reflux disease)   . Hashimoto's thyroiditis   . History of carpal tunnel syndrome    Left  . History of colon polyps 2010   Benign  . Hyperlipidemia   . Hypothyroidism    Hashimoto's per pt   . Osteopenia 10/2017   T score -1.9 FRAX 8.8% / 0.7%.  Stable from prior DEXA.  [name] SVT (supraventricular tachycardia) ([place])   . Wears glasses     Patient Active Problem List   Diagnosis Date Noted  . Carpal tunnel syndrome of left wrist 03/25/2019  . Abnormal CT of the abdomen 02/25/2018  . Abdominal pain, right upper quadrant 02/25/2018  . Abnormal finding on radiology exam 02/25/2018  . Allergic rhinitis 02/25/2018  . Brash 02/25/2018  . CN (constipation) 02/25/2018  . LBP (low back pain) 02/25/2018  . Combined form of age-related  cataract, both eyes 04/07/2017  . Posterior vitreous detachment of right eye 04/07/2017  . 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 CIN I (cervical intraepithelial neoplasia I) 01/02/2016  . Preoperative cardiovascular examination 11/10/2015  . ASCUS with positive high risk HPV cervical 12/13/2014  . Eyelid retraction 12/02/2014  . Dry eye syndrome of bilateral lacrimal glands 12/01/2014  . 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 Other disorders affecting eyelid function 12/01/2014  . 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 Peripheral visual field defect of both eyes 12/01/2014  . Age-related nuclear cataract of both eyes 09/06/2014  . Anatomical narrow angle of both eyes 09/06/2014  . Hx of Hashimoto thyroiditis 09/06/2014  . Keratoconjunctivitis sicca of both eyes not specified as Sjogren's 09/06/2014  . Meibomian gland dysfunction (MGD) of upper and lower lids of both eyes 09/06/2014  . Abnormal Papanicolaou smear of cervix with positive human papilloma virus (HPV) test 06/27/2014  . Hypokalemia 03/04/2014  . History of paroxysmal supraventricular tachycardia 02/25/2014  . Osteopenia 04/30/2013  . Dysphonia 04/05/2013  . Laryngopharyngeal reflux 04/05/2013  . Hashimoto's thyroiditis 03/29/2013  . 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 Hypercalcemia 05/25/2012  . Hypothyroidism 04/27/2012  . Hyperlipidemia 04/27/2012  . Paroxysmal supraventricular tachycardia ([place]) 04/27/2012  . Tachycardia 01/22/2012  . Endometrial polyp  12/04/2010  . Fibroid, uterus 11/27/2010  . Acquired hypothyroidism 01/17/2010  . Other and unspecified hyperlipidemia 02/01/2008  . Other specified cardiac dysrhythmias(427.89) 12/07/2007    Past Surgical History:  Procedure Laterality Date  . BREAST SURGERY  1976   CYST REMOVED  . CARDIAC ELECTROPHYSIOLOGY STUDY AND ABLATION  2001  . CESAREAN SECTION  1987  . COLONOSCOPY    . COLONOSCOPY WITH PROPOFOL N/A 02/12/2019     Procedure: COLONOSCOPY WITH PROPOFOL;  Surgeon: Wonda Horner, MD;  Location: [facility];  Service: Endoscopy;  Laterality: N/A;  . LEEP  2017  . left thumb surgery and Carpal Tunnel Release  9/14  . lower lid surgery   09/2018   dry eye from Hashimoto's  . POLYPECTOMY  02/12/2019   Procedure: POLYPECTOMY;  Surgeon: Wonda Horner, MD;  Location: [facility];  Service: Endoscopy;;    OB History    Gravida  1   Para  1   Term  1   Preterm      AB      Living  1     SAB      TAB      Ectopic      Multiple      Live Births               Home Medications    Prior to Admission medications   Medication Sig Start Date End Date Taking? Authorizing Provider  Cholecalciferol (VITAMIN D-3) 5000 units TABS Take 5,000 mg by mouth See admin instructions. Monday and Friday   Yes [provider]  fish oil-omega-3 fatty acids 1000 MG capsule Take 1 g by mouth daily.   Yes [provider]  fluticasone (FLONASE) 50 MCG/ACT nasal spray Place 1 spray into both nostrils as needed for allergies.  02/10/18  Yes [provider]  levothyroxine (SYNTHROID, LEVOTHROID) 75 MCG tablet Take 75 mcg by mouth daily before breakfast.   Yes [provider]  metoprolol succinate (TOPROL-XL) 50 MG 24 hr tablet Take 1 tablet (50 mg total) by mouth daily. Take with or immediately following a meal. 08/10/19  Yes Camnitz, Ocie Doyne, MD  Multiple Vitamin (MULTIVITAMIN WITH MINERALS) TABS tablet Take 1 tablet by mouth daily.   Yes [provider]  polyethylene glycol (MIRALAX / GLYCOLAX) packet Take 17 g by mouth daily as needed for mild constipation.    Yes [provider]  Probiotic Product (PROBIOTIC ADVANCED PO) Take 1 capsule by mouth daily.    Yes [provider]  rosuvastatin (CRESTOR) 10 MG tablet Take 10 mg by mouth 3 (three) times a week. Every Monday, Wednesday, and Friday in the evenings.   Yes [provider]   hydrocortisone (ANUSOL-HC) 25 MG suppository Place 1 suppository (25 mg total) rectally 2 (two) times daily. Patient taking differently: Place 25 mg rectally 2 (two) times daily as needed for hemorrhoids.  01/05/19   [name], PA-C    Family History Family History  Problem Relation Age of Onset  . Hypertension Mother   . Heart disease Mother   . Mitral valve prolapse Mother   . Glaucoma Mother   . Arthritis Mother   . Von Willebrand disease Mother   . Stroke Mother   . Rheum arthritis Mother   . Neuropathy Mother   . Hypertension Father   . Heart disease Father   . Colon polyps Father   . Glaucoma Father   . Arthritis Father   . Stroke Father   .  Rheum arthritis Father   . Pulmonary embolism Father   . Hypertension Brother   . Mitral valve prolapse Brother   . Glaucoma Brother   . Arthritis Brother   . Arthritis Sister     Social History Social History   Tobacco Use  . Smoking status: Never Smoker  . Smokeless tobacco: Never Used  Substance Use Topics  . Alcohol use: Not Currently  . Drug use: No     Allergies   Lidocaine, Lipitor [atorvastatin], Diphenhydramine, and Epinephrine   Review of Systems Review of Systems  Constitutional: Negative for chills and fever.  HENT: Negative for ear pain and sore throat.   Eyes: Negative for pain and visual disturbance.  Respiratory: Negative for cough and shortness of breath.   Cardiovascular: Negative for chest pain and palpitations.  Gastrointestinal: Negative for abdominal pain and vomiting.  Genitourinary: Negative for dysuria and hematuria.  Musculoskeletal: Positive for myalgias. Negative for arthralgias and back pain.  Skin: Negative for color change, rash and wound.  Neurological: Negative for seizures, syncope, weakness and numbness.  All other systems reviewed and are negative.    Physical Exam Triage Vital Signs ED Triage Vitals  Enc Vitals Group     BP 08/12/19 1524 123/78     Pulse Rate  08/12/19 1524 81     Resp 08/12/19 1524 19     Temp 08/12/19 1524 98 F (36.7 C)     Temp Source 08/12/19 1524 Oral     SpO2 08/12/19 1524 96 %     Weight 08/12/19 1522 170 lb (77.1 kg)     Height 08/12/19 1522 5\' 3"  (1.6 m)     Head Circumference --      Peak Flow --      Pain Score 08/12/19 1521 5     Pain Loc --      Pain Edu? --      Excl. in [place]? --    No data found.  Updated Vital Signs BP 123/78 (BP Location: Left Arm)   Pulse 81   Temp 98 F (36.7 C) (Oral)   Resp 19   Ht 5\' 3"  (1.6 m)   Wt 170 lb (77.1 kg)   LMP 11/26/2000   SpO2 96%   BMI 30.11 kg/m   Visual Acuity Right Eye Distance:   Left Eye Distance:   Bilateral Distance:    Right Eye Near:   Left Eye Near:    Bilateral Near:     Physical Exam Vitals and nursing note reviewed.  Constitutional:      General: She is not in acute distress.    Appearance: She is well-developed.  HENT:     Head: Normocephalic and atraumatic.     Mouth/Throat:     Mouth: Mucous membranes are moist.  Eyes:     Conjunctiva/sclera: Conjunctivae normal.  Cardiovascular:     Rate and Rhythm: Normal rate and regular rhythm.     Heart sounds: No murmur.  Pulmonary:     Effort: Pulmonary effort is normal. No respiratory distress.     Breath sounds: Normal breath sounds.  Abdominal:     Palpations: Abdomen is soft.     Tenderness: There is no abdominal tenderness.  Musculoskeletal:        General: Tenderness present. No swelling or deformity. Normal range of motion.     Cervical back: Neck supple.     Right lower leg: No edema.     Left lower leg: No edema.  Comments: Mild right calf tenderness. Homan's negative. No erythema, edema, increased warmth.   Skin:    General: Skin is warm and dry.     Capillary Refill: Capillary refill takes less than 2 seconds.     Findings: No bruising, erythema, lesion or rash.  Neurological:     General: No focal deficit present.     Mental Status: She is alert and oriented to  person, place, and time.     Sensory: No sensory deficit.     Motor: No weakness.     Coordination: Coordination normal.     Gait: Gait normal.  Psychiatric:        Mood and Affect: Mood normal.        Behavior: Behavior normal.      UC Treatments / Results  Labs (all labs ordered are listed, but only abnormal results are displayed) Labs Reviewed - No data to display  EKG   Radiology US Venous Img Lower Unilateral Right (DVT)  Result Date: 08/12/2019 CLINICAL DATA:  Right lower extremity pain for the past 4 days. Evaluate for DVT. EXAM: RIGHT LOWER EXTREMITY VENOUS DOPPLER ULTRASOUND TECHNIQUE: Gray-scale sonography with graded compression, as well as color Doppler and duplex ultrasound were performed to evaluate the lower extremity deep venous systems from the level of the common femoral vein and including the common femoral, femoral, profunda femoral, popliteal and calf veins including the posterior tibial, peroneal and gastrocnemius veins when visible. The superficial great saphenous vein was also interrogated. Spectral Doppler was utilized to evaluate flow at rest and with distal augmentation maneuvers in the common femoral, femoral and popliteal veins. COMPARISON:  None. FINDINGS: Contralateral Common Femoral Vein: Respiratory phasicity is normal and symmetric with the symptomatic side. No evidence of thrombus. Normal compressibility. Common Femoral Vein: No evidence of thrombus. Normal compressibility, respiratory phasicity and response to augmentation. Saphenofemoral Junction: No evidence of thrombus. Normal compressibility and flow on color Doppler imaging. Profunda Femoral Vein: No evidence of thrombus. Normal compressibility and flow on color Doppler imaging. Femoral Vein: No evidence of thrombus. Normal compressibility, respiratory phasicity and response to augmentation. Popliteal Vein: No evidence of thrombus. Normal compressibility, respiratory phasicity and response to  augmentation. Calf Veins: No evidence of thrombus. Normal compressibility and flow on color Doppler imaging. Superficial Great Saphenous Vein: No evidence of thrombus. Normal compressibility. Venous Reflux:  None. Other Findings: Mixed echogenic occlusive thrombus is seen within a prominent superficial varicosity involving the posterior aspect of the calf which correlates with the patient's palpable area of concern (images 36 through 41). Note is made of an echogenic calcification associated with this presumed chronic thrombus. IMPRESSION: 1. No evidence of DVT within the right lower extremity. 2. Examination is positive for short segment age-indeterminate though presumably chronic superficial thrombophlebitis involving a prominent superficial varicosity within the subcutaneous tissues of the posterior aspect of the right calf. While the SVT is favored to be chronic in etiology (given the presence calcification), in the absence of prior examinations, an acute on chronic process is not excluded. Clinical correlation is advised. Electronically Signed   By: Sandi Mariscal M.D.   On: 08/12/2019 16:33    Procedures Procedures (including critical care time)  Medications Ordered in UC Medications - No data to display  Initial Impression / Assessment and Plan / UC Course  I have reviewed the triage vital signs and the nursing notes.  Pertinent labs & imaging results that were available during my care of the patient were reviewed by me and  considered in my medical decision making (see chart for details).   Right calf pain.  Ultrasound shows presumably chronic superficial thrombophlebitis but no DVT.  Discussed results with patient and instructed her to follow up with her PCP to discuss the results also.  Tylenol as needed for discomfort.  Patient agrees to plan of care.     Final Clinical Impressions(s) / UC Diagnoses   Final diagnoses:  Right calf pain     Discharge Instructions     Go to [facility] for the ultrasound of your right calf.  I will call you with the results.       ED Prescriptions    None     PDMP not reviewed this encounter.   Sharion Balloon, NP 08/12/19 1712

## 2019-08-24 ENCOUNTER — Telehealth: Payer: Self-pay | Admitting: Cardiology

## 2019-08-24 NOTE — Telephone Encounter (Signed)
New message   Patient wants to know if a note has been sent over to (PCP) Dr. Cari Caraway to be seen at the lipid clinic. Please advise.

## 2019-08-24 NOTE — Telephone Encounter (Signed)
Pt aware I will look into this and let her know.

## 2019-08-25 NOTE — Telephone Encounter (Signed)
Pt aware office will call to arrange pharmD visit to discuss cholesterol, medication and muscle aches. She is agreeable to plan.

## 2019-09-07 ENCOUNTER — Other Ambulatory Visit: Payer: Self-pay

## 2019-09-07 ENCOUNTER — Ambulatory Visit (INDEPENDENT_AMBULATORY_CARE_PROVIDER_SITE_OTHER): Payer: Medicare Other | Admitting: Pharmacist

## 2019-09-07 DIAGNOSIS — G720 Drug-induced myopathy: Secondary | ICD-10-CM | POA: Diagnosis not present

## 2019-09-07 DIAGNOSIS — E782 Mixed hyperlipidemia: Secondary | ICD-10-CM

## 2019-09-07 DIAGNOSIS — T466X5A Adverse effect of antihyperlipidemic and antiarteriosclerotic drugs, initial encounter: Secondary | ICD-10-CM

## 2019-09-07 MED ORDER — REPATHA SURECLICK 140 MG/ML ~~LOC~~ SOAJ: 1.0000 "pen " | SUBCUTANEOUS | 11 refills | Status: DC

## 2019-09-07 NOTE — Patient Instructions (Addendum)
It was nice to meet you today!  I will reach out to your insurance to see if they cover an injectable cholesterol medication (Repatha or Praluent). These shots are given once every 2 weeks in the fatty tissue of your stomach or upper outer thigh. The medication is stored in the fridge and lowers your LDL cholesterol by 60%  I'll also look into coverage of Nexlizet. This is a pill you take once a day that lowers your LDL by 40%. It's a combination of ezetimibe (Zetia) and bempedoic acid (Nexletol)  If your insurance does not cover either of these options, we can still add on Zetia (ezetimibe) which will lower your LDL an additional 20%

## 2019-09-07 NOTE — Progress Notes (Signed)
Patient ID: [name]                 DOB: [birth year]                    MRN: [id number]     HPI: [name] is a 69 y.o. female patient referred to lipid clinic by Dr Curt Bears and PCP Dr Addison Lank. PMH is significant for HTN, HLD, palpitations, tachycardia s/p ablation for SVT at [facility] in 2001, and Hashimoto's thyroiditis. She has a history of statin intolerance and presents to lipid clinic for further management.  Pt presents today in good spirits. She previously experienced muscle pain on Lipitor 5mg  and 10mg  daily. She is currently tolerating Crestor 10mg  3 days a week ok, although she does have some muscle pain. When she recently increased her frequency to every other day, she experienced worsening of her muscle pain. She has a family history of heart disease; her mother died from a stroke and had a valve replacement. Her father also had high cholesterol.  Current Medications: rosuvastatin 10mg  days 3 per week, fish oil 1g daily Intolerances: atorvastatin 5mg  and 10mg  daily - muscle pain, Crestor 10mg  every other day - muscle pain Risk Factors: family history of CAD, 10 year ASCVD risk of 9.5% LDL goal: 100mg /dL  Diet: Eating more grilled food, lots of fruits and vegetables  Exercise: Walks every day  Family History: The patient's family history includes Arthritis in her brother, father, mother, and sister; Colon polyps in her father; Glaucoma in her brother, father, and mother; Heart disease in her father and mother; Hypertension in her brother, father, and mother; Mitral valve prolapse in her brother and mother; Neuropathy in her mother; Pulmonary embolism in her father; Rheum arthritis in her father and mother; Stroke in her father and mother; Von Willebrand disease in her mother.   Social History: The patient reports that she has never smoked. She has never used smokeless tobacco. She reports previous alcohol use. She reports that she does not use drugs.    Labs: 06/23/19: TC 206, TG 119, HDL 54, LDL 130, LFTs normal (rosuvastatin 10mg  3 days per week)  Past Medical History:  Diagnosis Date  . Arthritis    Neck  . CIN I (cervical intraepithelial neoplasia I) 2017  . Complication of anesthesia    delayed SVT:  can occur from a few hours to 3 days after the general anesthesia or conscious sedation as happened with her colonoscopy.   [name] Dysplasia of cervix, low grade (CIN 1)    LEEP cervical conization September 2017 margins negative CIN-1  . GERD (gastroesophageal reflux disease)   . Hashimoto's thyroiditis   . History of carpal tunnel syndrome    Left  . History of colon polyps 2010   Benign  . Hyperlipidemia   . Hypothyroidism    Hashimoto's per pt   . Osteopenia 10/2017   T score -1.9 FRAX 8.8% / 0.7%.  Stable from prior DEXA.  [name] SVT (supraventricular tachycardia) ([place])   . Wears glasses     Current Outpatient Medications on File Prior to Visit  Medication Sig Dispense Refill  . Cholecalciferol (VITAMIN D-3) 5000 units TABS Take 5,000 mg by mouth See admin instructions. Monday and Friday    . fish oil-omega-3 fatty acids 1000 MG capsule Take 1 g by mouth daily.    . fluticasone (FLONASE) 50 MCG/ACT nasal spray Place 1 spray into both nostrils as needed for allergies.     [name]  hydrocortisone (ANUSOL-HC) 25 MG suppository Place 1 suppository (25 mg total) rectally 2 (two) times daily. (Patient taking differently: Place 25 mg rectally 2 (two) times daily as needed for hemorrhoids. ) 20 suppository 0  . levothyroxine (SYNTHROID, LEVOTHROID) 75 MCG tablet Take 75 mcg by mouth daily before breakfast.    . metoprolol succinate (TOPROL-XL) 50 MG 24 hr tablet Take 1 tablet (50 mg total) by mouth daily. Take with or immediately following a meal. 90 tablet 3  . Multiple Vitamin (MULTIVITAMIN WITH MINERALS) TABS tablet Take 1 tablet by mouth daily.    . polyethylene glycol (MIRALAX / GLYCOLAX) packet Take 17 g by mouth daily as needed for mild  constipation.     . Probiotic Product (PROBIOTIC ADVANCED PO) Take 1 capsule by mouth daily.     . rosuvastatin (CRESTOR) 10 MG tablet Take 10 mg by mouth 3 (three) times a week. Every Monday, Wednesday, and Friday in the evenings.     No current facility-administered medications on file prior to visit.    Allergies  Allergen Reactions  . Lidocaine     Other reaction(s): Tachycardia  . Lipitor [Atorvastatin] Other (See Comments)    Muscle aches  . Diphenhydramine Palpitations  . Epinephrine Palpitations    Assessment/Plan:  1. Hyperlipidemia - LDL currently 130mg /dL on rosuvastatin 10mg  3 days a week, above goal < 100 for primary prevention with CV risk factors. She is currently experiencing some myalgias on 3 days a week rosuvastatin dosing. Discussed alternative therapies, including Zetia and PCSK9i therapy. Pt would prefer to try PCSK9i therapy as she wishes to stop her statin if possible. Her insurance covers [place] for a $6 copay, prior auth approved through 03/09/20. Will recheck advanced lipid panel and Lp(a) in 2-3 months.  Reanne Nellums E. Dellia Donnelly, PharmD, BCACP, [place] [id number], [place], [place] [address] Phone: [phone]; Fax: [phone] 09/07/2019 3:32 PM

## 2019-10-14 ENCOUNTER — Emergency Department: Payer: Medicare Other

## 2019-10-14 ENCOUNTER — Encounter: Payer: Self-pay | Admitting: Emergency Medicine

## 2019-10-14 ENCOUNTER — Emergency Department
Admission: EM | Admit: 2019-10-14 | Discharge: 2019-10-15 | Disposition: A | Discharge status: Home / Self Care | Payer: Medicare Other | Attending: Emergency Medicine | Admitting: Emergency Medicine

## 2019-10-14 ENCOUNTER — Other Ambulatory Visit: Payer: Self-pay

## 2019-10-14 DIAGNOSIS — I119 Hypertensive heart disease without heart failure: Secondary | ICD-10-CM | POA: Insufficient documentation

## 2019-10-14 DIAGNOSIS — E039 Hypothyroidism, unspecified: Secondary | ICD-10-CM | POA: Insufficient documentation

## 2019-10-14 DIAGNOSIS — R1013 Epigastric pain: Secondary | ICD-10-CM | POA: Diagnosis not present

## 2019-10-14 DIAGNOSIS — K219 Gastro-esophageal reflux disease without esophagitis: Secondary | ICD-10-CM | POA: Diagnosis not present

## 2019-10-14 DIAGNOSIS — R002 Palpitations: Secondary | ICD-10-CM | POA: Diagnosis not present

## 2019-10-14 DIAGNOSIS — E860 Dehydration: Secondary | ICD-10-CM

## 2019-10-14 DIAGNOSIS — R42 Dizziness and giddiness: Secondary | ICD-10-CM | POA: Diagnosis not present

## 2019-10-14 LAB — CBC
HCT: 37.3 % (ref 36.0–46.0)
Hemoglobin: 13.6 g/dL (ref 12.0–15.0)
MCH: 29.8 pg (ref 26.0–34.0)
MCHC: 36.5 g/dL — ABNORMAL HIGH (ref 30.0–36.0)
MCV: 81.6 fL (ref 80.0–100.0)
Platelets: 158 10*3/uL (ref 150–400)
RBC: 4.57 MIL/uL (ref 3.87–5.11)
RDW: 14.6 % (ref 11.5–15.5)
WBC: 5.1 10*3/uL (ref 4.0–10.5)
nRBC: 0 % (ref 0.0–0.2)

## 2019-10-14 LAB — TROPONIN I (HIGH SENSITIVITY)
Troponin I (High Sensitivity): 3 ng/L (ref <18)
Troponin I (High Sensitivity): 3 ng/L (ref <18)

## 2019-10-14 LAB — DIFFERENTIAL
Abs Immature Granulocytes: 0 10*3/uL (ref 0.00–0.07)
Basophils Absolute: 0 10*3/uL (ref 0.0–0.1)
Basophils Relative: 0 %
Eosinophils Absolute: 0.1 10*3/uL (ref 0.0–0.5)
Eosinophils Relative: 1 %
Immature Granulocytes: 0 %
Lymphocytes Relative: 53 %
Lymphs Abs: 2.7 10*3/uL (ref 0.7–4.0)
Monocytes Absolute: 0.5 10*3/uL (ref 0.1–1.0)
Monocytes Relative: 9 %
Neutro Abs: 1.9 10*3/uL (ref 1.7–7.7)
Neutrophils Relative %: 37 %

## 2019-10-14 LAB — PROTIME-INR
INR: 1 (ref 0.8–1.2)
Prothrombin Time: 12.4 seconds (ref 11.4–15.2)

## 2019-10-14 LAB — COMPREHENSIVE METABOLIC PANEL
ALT: 23 U/L (ref 0–44)
AST: 27 U/L (ref 15–41)
Albumin: 4.6 g/dL (ref 3.5–5.0)
Alkaline Phosphatase: 72 U/L (ref 38–126)
Anion gap: 12 (ref 5–15)
BUN: 19 mg/dL (ref 8–23)
CO2: 24 mmol/L (ref 22–32)
Calcium: 9.9 mg/dL (ref 8.9–10.3)
Chloride: 104 mmol/L (ref 98–111)
Creatinine, Ser: 1 mg/dL (ref 0.44–1.00)
GFR calc Af Amer: >60 mL/min (ref >60)
GFR calc non Af Amer: 58 mL/min — ABNORMAL LOW (ref >60)
Glucose, Bld: 104 mg/dL — ABNORMAL HIGH (ref 70–99)
Potassium: 3.5 mmol/L (ref 3.5–5.1)
Sodium: 140 mmol/L (ref 135–145)
Total Bilirubin: 1 mg/dL (ref 0.3–1.2)
Total Protein: 7.2 g/dL (ref 6.5–8.1)

## 2019-10-14 LAB — GLUCOSE, CAPILLARY: Glucose-Capillary: 85 mg/dL (ref 70–99)

## 2019-10-14 LAB — APTT: aPTT: 31 seconds (ref 24–36)

## 2019-10-14 MED ORDER — ONDANSETRON HCL 4 MG/2ML IJ SOLN
4.0000 mg | Freq: Once | INTRAMUSCULAR | Status: AC
  Administered 2019-10-15: 4 mg via INTRAVENOUS
  Filled 2019-10-14: qty 2

## 2019-10-14 MED ORDER — FAMOTIDINE IN NACL 20-0.9 MG/50ML-% IV SOLN
20.0000 mg | Freq: Once | INTRAVENOUS | Status: AC
  Administered 2019-10-15: 20 mg via INTRAVENOUS
  Filled 2019-10-14: qty 50

## 2019-10-14 MED ORDER — ALUM & MAG HYDROXIDE-SIMETH 200-200-20 MG/5ML PO SUSP
30.0000 mL | Freq: Once | ORAL | Status: AC
  Administered 2019-10-15: 30 mL via ORAL
  Filled 2019-10-14: qty 30

## 2019-10-14 MED ORDER — SODIUM CHLORIDE 0.9% FLUSH
3.0000 mL | Freq: Once | INTRAVENOUS | Status: AC
Start: 2019-10-14 — End: 2019-10-15
  Administered 2019-10-15: 3 mL via INTRAVENOUS

## 2019-10-14 MED ORDER — LACTATED RINGERS IV BOLUS
1000.0000 mL | Freq: Once | INTRAVENOUS | Status: AC
  Administered 2019-10-15: 1000 mL via INTRAVENOUS

## 2019-10-14 NOTE — ED Triage Notes (Signed)
Pt to ED from home c/o chest palpitations and burning pain today, denies SOB.  States feeling generalized weakness, dizzy, and left sided tingling and numbness since early afternoon,  LKW 1400 today.  Pt is A&Ox4, speech WNL, strength equal and strong x4 extremities, but duller sensation to left face, arm and leg compared to right.

## 2019-10-14 NOTE — ED Notes (Signed)
Pt transported to CT ?

## 2019-10-15 ENCOUNTER — Emergency Department (HOSPITAL_COMMUNITY)
Admission: EM | Admit: 2019-10-15 | Discharge: 2019-10-15 | Disposition: A | Discharge status: Home / Self Care | Payer: Medicare Other | Attending: Emergency Medicine | Admitting: Emergency Medicine

## 2019-10-15 ENCOUNTER — Other Ambulatory Visit: Payer: Self-pay

## 2019-10-15 ENCOUNTER — Emergency Department: Payer: Medicare Other

## 2019-10-15 ENCOUNTER — Encounter (HOSPITAL_COMMUNITY): Payer: Self-pay

## 2019-10-15 DIAGNOSIS — R002 Palpitations: Secondary | ICD-10-CM | POA: Diagnosis not present

## 2019-10-15 DIAGNOSIS — Z5321 Procedure and treatment not carried out due to patient leaving prior to being seen by health care provider: Secondary | ICD-10-CM | POA: Insufficient documentation

## 2019-10-15 DIAGNOSIS — R531 Weakness: Secondary | ICD-10-CM | POA: Diagnosis present

## 2019-10-15 LAB — URINALYSIS, COMPLETE (UACMP) WITH MICROSCOPIC
Bacteria, UA: NONE SEEN
Bilirubin Urine: NEGATIVE
Glucose, UA: NEGATIVE mg/dL
Ketones, ur: NEGATIVE mg/dL
Leukocytes,Ua: NEGATIVE
Nitrite: NEGATIVE
Protein, ur: NEGATIVE mg/dL
Specific Gravity, Urine: >1.046 — ABNORMAL HIGH (ref 1.005–1.030)
pH: 5 (ref 5.0–8.0)

## 2019-10-15 LAB — CBC
HCT: 38.3 % (ref 36.0–46.0)
Hemoglobin: 13.3 g/dL (ref 12.0–15.0)
MCH: 29.7 pg (ref 26.0–34.0)
MCHC: 34.7 g/dL (ref 30.0–36.0)
MCV: 85.5 fL (ref 80.0–100.0)
Platelets: 149 10*3/uL — ABNORMAL LOW (ref 150–400)
RBC: 4.48 MIL/uL (ref 3.87–5.11)
RDW: 15.1 % (ref 11.5–15.5)
WBC: 4.1 10*3/uL (ref 4.0–10.5)
nRBC: 0 % (ref 0.0–0.2)

## 2019-10-15 LAB — BASIC METABOLIC PANEL
Anion gap: 10 (ref 5–15)
BUN: 13 mg/dL (ref 8–23)
CO2: 22 mmol/L (ref 22–32)
Calcium: 9.2 mg/dL (ref 8.9–10.3)
Chloride: 110 mmol/L (ref 98–111)
Creatinine, Ser: 0.79 mg/dL (ref 0.44–1.00)
GFR calc Af Amer: >60 mL/min (ref >60)
GFR calc non Af Amer: >60 mL/min (ref >60)
Glucose, Bld: 101 mg/dL — ABNORMAL HIGH (ref 70–99)
Potassium: 3.9 mmol/L (ref 3.5–5.1)
Sodium: 142 mmol/L (ref 135–145)

## 2019-10-15 LAB — TROPONIN I (HIGH SENSITIVITY): Troponin I (High Sensitivity): 3 ng/L (ref <18)

## 2019-10-15 LAB — LIPASE, BLOOD: Lipase: 29 U/L (ref 11–51)

## 2019-10-15 MED ORDER — IOHEXOL 300 MG/ML  SOLN
100.0000 mL | Freq: Once | INTRAMUSCULAR | Status: AC | PRN
  Administered 2019-10-15: 100 mL via INTRAVENOUS

## 2019-10-15 NOTE — ED Triage Notes (Signed)
Pt c.o continued weakness and palpitations since yesterday, pt seen at [facility] and was told she was dehydatred. Pt c.o nausea at this time, 4mg  zofran given en route. Pt a.o.

## 2019-10-15 NOTE — ED Provider Notes (Signed)
[facility] Emergency Department Provider Note  ____________________________________________  Time seen: Approximately 12:09 AM  I have reviewed the triage vital signs and the nursing notes.   HISTORY  Chief Complaint Tingling and Palpitations   HPI [name] is a 69 y.o. female with a history of neck arthritis, GERD, Hashimoto's thyroiditis, hypothyroidism, hyperlipidemia, SVT who presents for evaluation of generalized weakness, lightheadedness, palpitations.  Patient reports that she works for several hours in her yard today when it was very hot.  She try to keep up with her p.o. intake but feels like she got dehydrated.  She started feeling lightheaded and having palpitations.  She is also complaining of burning epigastric and chest pain which is similar to prior history of reflux. She reports increase gas and belching today and also feeling some abdominal distention. She reports that she fell generalized weakness and tingling but reports that she may have felt worse on the left when compared to the right.  She denies any unilateral weakness or numbness at this time.  She also had a moderate pressure-like headache for most of the day.  She reports similar symptoms in the past when she got dehydrated.  She denies any personal history of stroke.  Her mother had a stroke in her 97s.  She is not a smoker.  She denies shortness of breath, back pain, paresthesias, facial droop, slurred speech, and gait abnormalities.  At this time she continues to feel dehydrated but the lightheadedness, palpitations, the headache, and the generalized weakness have mostly subsided.  She still complaining of burning chest and epigastric pain and nausea.  No vomiting, no diarrhea, no constipation, no abdominal distention.  Past Medical History:  Diagnosis Date  . Arthritis    Neck  . CIN I (cervical intraepithelial neoplasia I) 2017  . Complication of anesthesia    delayed  SVT:  can occur from a few hours to 3 days after the general anesthesia or conscious sedation as happened with her colonoscopy.   [name] Dysplasia of cervix, low grade (CIN 1)    LEEP cervical conization September 2017 margins negative CIN-1  . GERD (gastroesophageal reflux disease)   . Hashimoto's thyroiditis   . History of carpal tunnel syndrome    Left  . History of colon polyps 2010   Benign  . Hyperlipidemia   . Hypothyroidism    Hashimoto's per pt   . Osteopenia 10/2017   T score -1.9 FRAX 8.8% / 0.7%.  Stable from prior DEXA.  [name] SVT (supraventricular tachycardia) ([place])   . Wears glasses     Patient Active Problem List   Diagnosis Date Noted  . 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 Carpal tunnel syndrome of left wrist 03/25/2019  . Abnormal CT of the abdomen 02/25/2018  . Abdominal pain, right upper quadrant 02/25/2018  . Abnormal finding on radiology exam 02/25/2018  . Allergic rhinitis 02/25/2018  . Brash 02/25/2018  . CN (constipation) 02/25/2018  . LBP (low back pain) 02/25/2018  . Combined form of age-related cataract, both eyes 04/07/2017  . Posterior vitreous detachment of right eye 04/07/2017  . 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 CIN I (cervical intraepithelial neoplasia I) 01/02/2016  . Preoperative cardiovascular examination 11/10/2015  . ASCUS with positive high risk HPV cervical 12/13/2014  . Eyelid retraction 12/02/2014  . Dry eye syndrome of bilateral lacrimal glands 12/01/2014  . 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 Other disorders affecting eyelid function 12/01/2014  . 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  Peripheral visual field defect of both eyes 12/01/2014  . Age-related nuclear cataract of both eyes 09/06/2014  . Anatomical narrow angle of both eyes 09/06/2014  . Hx of Hashimoto thyroiditis 09/06/2014  . Keratoconjunctivitis sicca of both eyes not specified as Sjogren's 09/06/2014  . Meibomian gland dysfunction (MGD) of upper and lower lids of  both eyes 09/06/2014  . Abnormal Papanicolaou smear of cervix with positive human papilloma virus (HPV) test 06/27/2014  . Hypokalemia 03/04/2014  . History of paroxysmal supraventricular tachycardia 02/25/2014  . Osteopenia 04/30/2013  . Dysphonia 04/05/2013  . Laryngopharyngeal reflux 04/05/2013  . Hashimoto's thyroiditis 03/29/2013  . 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 Hypercalcemia 05/25/2012  . Hypothyroidism 04/27/2012  . Hyperlipidemia 04/27/2012  . Paroxysmal supraventricular tachycardia ([place]) 04/27/2012  . Tachycardia 01/22/2012  . Endometrial polyp 12/04/2010  . Fibroid, uterus 11/27/2010  . Acquired hypothyroidism 01/17/2010  . Other and unspecified hyperlipidemia 02/01/2008  . Other specified cardiac dysrhythmias(427.89) 12/07/2007    Past Surgical History:  Procedure Laterality Date  . BREAST SURGERY  1976   CYST REMOVED  . CARDIAC ELECTROPHYSIOLOGY STUDY AND ABLATION  2001  . CESAREAN SECTION  1987  . COLONOSCOPY    . COLONOSCOPY WITH PROPOFOL N/A 02/12/2019   Procedure: COLONOSCOPY WITH PROPOFOL;  Surgeon: Wonda Horner, MD;  Location: [facility] ENDOSCOPY;  Service: Endoscopy;  Laterality: N/A;  . LEEP  2017  . left thumb surgery and Carpal Tunnel Release  9/14  . lower lid surgery   09/2018   dry eye from Hashimoto's  . POLYPECTOMY  02/12/2019   Procedure: POLYPECTOMY;  Surgeon: Wonda Horner, MD;  Location: [facility];  Service: Endoscopy;;    Prior to Admission medications   Medication Sig Start Date End Date Taking? Authorizing Provider  Cholecalciferol (VITAMIN D-3) 5000 units TABS Take 5,000 mg by mouth See admin instructions. Monday and Friday    [provider]  Evolocumab (REPATHA SURECLICK) 681 MG/ML SOAJ Inject 1 pen into the skin every 14 (fourteen) days. 09/07/19   Camnitz, Ocie Doyne, MD  fish oil-omega-3 fatty acids 1000 MG capsule Take 1 g by mouth daily.    [provider]  fluticasone  (FLONASE) 50 MCG/ACT nasal spray Place 1 spray into both nostrils as needed for allergies.  02/10/18   [provider]  hydrocortisone (ANUSOL-HC) 25 MG suppository Place 1 suppository (25 mg total) rectally 2 (two) times daily. Patient taking differently: Place 25 mg rectally 2 (two) times daily as needed for hemorrhoids.  01/05/19   [name], PA-C  levothyroxine (SYNTHROID, LEVOTHROID) 75 MCG tablet Take 75 mcg by mouth daily before breakfast.    [provider]  metoprolol succinate (TOPROL-XL) 50 MG 24 hr tablet Take 1 tablet (50 mg total) by mouth daily. Take with or immediately following a meal. 08/10/19   Camnitz, Ocie Doyne, MD  Multiple Vitamin (MULTIVITAMIN WITH MINERALS) TABS tablet Take 1 tablet by mouth daily.    [provider]  polyethylene glycol (MIRALAX / GLYCOLAX) packet Take 17 g by mouth daily as needed for mild constipation.     [provider]  Probiotic Product (PROBIOTIC ADVANCED PO) Take 1 capsule by mouth daily.     [provider]  rosuvastatin (CRESTOR) 10 MG tablet Take 10 mg by mouth 3 (three) times a week. Every Monday, Wednesday, and Friday in the evenings.    [provider]    Allergies Lidocaine, Lipitor [atorvastatin], Diphenhydramine, and Epinephrine  Family History  Problem Relation Age of Onset  . Hypertension Mother   . Heart disease Mother   . Mitral valve prolapse Mother   . Glaucoma Mother   . Arthritis Mother   . Von Willebrand disease Mother   . Stroke Mother   . Rheum arthritis Mother   . Neuropathy Mother   . Hypertension Father   . Heart disease Father   . Colon polyps Father   . Glaucoma Father   . Arthritis Father   . Stroke Father   . Rheum arthritis Father   . Pulmonary embolism Father   . Hypertension Brother   . Mitral valve prolapse Brother   . Glaucoma Brother   . Arthritis Brother   . Arthritis Sister     Social History Social History   Tobacco Use  . Smoking  status: Never Smoker  . Smokeless tobacco: Never Used  Vaping Use  . Vaping Use: Never used  Substance Use Topics  . Alcohol use: Not Currently  . Drug use: No    Review of Systems  Constitutional: Negative for fever. + Lightheadedness and generalized weak Eyes: Negative for visual changes. ENT: Negative for sore throat. Neck: No neck pain  Cardiovascular: Negative for chest pain. + palpitations Respiratory: Negative for shortness of breath. Gastrointestinal: + burning abdominal pain and nausea. No vomiting or diarrhea. Genitourinary: Negative for dysuria. Musculoskeletal: Negative for back pain. Skin: Negative for rash. Neurological: Negative for  weakness or numbness. + HA Psych: No SI or HI  ____________________________________________   PHYSICAL EXAM:  VITAL SIGNS: ED Triage Vitals [10/14/19 1920]  Enc Vitals Group     BP (!) 139/92     Pulse Rate 77     Resp 16     Temp 98.3 F (36.8 C)     Temp Source Oral     SpO2 100 %     Weight 170 lb (77.1 kg)     Height 5\' 3"  (1.6 m)     Head Circumference      Peak Flow      Pain Score 6     Pain Loc      Pain Edu?      Excl. in [place]?     Constitutional: Alert and oriented. Well appearing and in no apparent distress. HEENT:      Head: Normocephalic and atraumatic.         Eyes: Conjunctivae are normal. Sclera is non-icteric.       Mouth/Throat: Mucous membranes are moist.       Neck: Supple with no signs of meningismus. Cardiovascular: Regular rate and rhythm. No murmurs, gallops, or rubs. 2+ symmetrical distal pulses are present in all extremities. No JVD. Respiratory: Normal respiratory effort. Lungs are clear to auscultation bilaterally. No wheezes, crackles, or rhonchi.  Gastrointestinal: Soft, non tender, and non distended with positive bowel sounds. No rebound or guarding. Musculoskeletal: Nontender with normal range of motion in all extremities. No edema, cyanosis, or erythema of extremities. Neurologic:  Normal speech and language. Face is symmetric. EOMI, PERRL, normal strength and sensation x4, no pronator drift, no dysmetria. Skin: Skin is warm, dry and intact. No rash noted. Psychiatric: Mood and affect are normal. Speech and behavior are normal.  ____________________________________________   LABS (all labs ordered are listed, but only abnormal results are displayed)  Labs Reviewed  CBC - Abnormal; Notable for the following components:      Result Value   MCHC 36.5 (*)    All other components within  normal limits  COMPREHENSIVE METABOLIC PANEL - Abnormal; Notable for the following components:   Glucose, Bld 104 (*)    GFR calc non Af Amer 58 (*)    All other components within normal limits  URINALYSIS, COMPLETE (UACMP) WITH MICROSCOPIC - Abnormal; Notable for the following components:   Color, Urine STRAW (*)    APPearance CLEAR (*)    Specific Gravity, Urine >1.046 (*)    Hgb urine dipstick SMALL (*)    All other components within normal limits  GLUCOSE, CAPILLARY  PROTIME-INR  APTT  DIFFERENTIAL  LIPASE, BLOOD  CBG MONITORING, ED  TROPONIN I (HIGH SENSITIVITY)  TROPONIN I (HIGH SENSITIVITY)   ____________________________________________  EKG  ED ECG REPORT I, Rudene Re, the attending physician, personally viewed and interpreted this ECG.  Normal sinus rhythm, rate of 75, normal intervals, normal axis, no ST elevations or depressions.  Normal EKG. ____________________________________________  RADIOLOGY  I have personally reviewed the images performed during this visit and I agree with the Radiologist's read.   Interpretation by Radiologist:  DG Chest 2 View  Result Date: 10/14/2019 CLINICAL DATA:  Chest pain EXAM: CHEST - 2 VIEW COMPARISON:  08/05/2017 FINDINGS: Mild chronic bronchitic changes. No focal opacity or pleural effusion. Normal cardiomediastinal silhouette. No pneumothorax. IMPRESSION: No active cardiopulmonary disease. Electronically Signed    By: Donavan Foil M.D.   On: 10/14/2019 19:50   CT HEAD WO CONTRAST  Result Date: 10/14/2019 CLINICAL DATA:  Dizziness with left-sided numbness EXAM: CT HEAD WITHOUT CONTRAST TECHNIQUE: Contiguous axial images were obtained from the base of the skull through the vertex without intravenous contrast. COMPARISON:  10/02/2009 FINDINGS: Brain: There is no mass, hemorrhage or extra-axial collection. The size and configuration of the ventricles and extra-axial CSF spaces are normal. The brain parenchyma is normal, without acute or chronic infarction. Vascular: No abnormal hyperdensity of the major intracranial arteries or dural venous sinuses. No intracranial atherosclerosis. Skull: The visualized skull base, calvarium and extracranial soft tissues are normal. Sinuses/Orbits: No fluid levels or advanced mucosal thickening of the visualized paranasal sinuses. No mastoid or middle ear effusion. The orbits are normal. IMPRESSION: Normal head CT. Electronically Signed   By: Ulyses Jarred M.D.   On: 10/14/2019 19:43   CT ABDOMEN PELVIS W CONTRAST  Result Date: 10/15/2019 CLINICAL DATA:  Abd pain, unspecified EXAM: CT ABDOMEN AND PELVIS WITH CONTRAST TECHNIQUE: Multidetector CT imaging of the abdomen and pelvis was performed using the standard protocol following bolus administration of intravenous contrast. CONTRAST:  162mL OMNIPAQUE IOHEXOL 300 MG/ML  SOLN COMPARISON:  CT colonography 09/21/2018 CT 02/04/2014, abdominal MRI 09/13/2014, 10/30/2015 FINDINGS: Lower chest: Tiny subpleural 4 mm nodule in the right lower lobe. 5 mm. Diaphragmatic nodule in the right lower lobe, series 4, image 10. Hepatobiliary: There are 2 small subcentimeter hepatic hypodensities that are too small to accurately characterize. Small subcapsular calcified granuloma. Gallbladder minimally distended. No calcified gallstone. Pancreas: No ductal dilatation or inflammation. Spleen: Normal in size without focal abnormality. Adrenals/Urinary  Tract: Normal adrenal glands. No hydronephrosis or perinephric edema. Homogeneous renal enhancement with symmetric excretion on delayed phase imaging. Delayed phase and noted in the scan through the bladder demonstrates normal opacification of the ureters. Urinary bladder is slightly irregular in shape with wall thickening, only minimally distended. Stomach/Bowel: Stomach partially distended. No gastric wall thickening. There is no small bowel obstruction or evidence of inflammation. Small volume of stool throughout the colon. No colonic wall thickening. Appendix is not definitively visualized on the current exam.  There is no evidence of appendicitis. Vascular/Lymphatic: Aortic atherosclerosis. No aortic aneurysm. The portal vein is patent. Circumaortic left renal vein. No adenopathy. Reproductive: Heterogeneous enlarged uterus with 5 cm mass involving the left uterine body, likely fibroid. Additional coarse calcifications in the uterus likely degenerative fibroid. No evidence of adnexal mass. Other: No free air, free fluid, or intra-abdominal fluid collection. Musculoskeletal: Degenerative disc disease at L5-S1. There are no acute or suspicious osseous abnormalities. IMPRESSION: 1. Mild bladder wall thickening, can be seen with urinary tract infection. 2. No other acute abnormality in the abdomen/pelvis. 3. Uterine fibroids. 4. Small pulmonary nodules in the right lower lobe, largest measuring 5 mm. In a low risk patient, no further follow-up is needed. In a high-risk patient, consider 12 month follow-up CT. Aortic Atherosclerosis (ICD10-I70.0). Electronically Signed   By: Keith Rake M.D.   On: 10/15/2019 02:21     ____________________________________________   PROCEDURES  Procedure(s) performed:yes .1-3 Lead EKG Interpretation Performed by: Rudene Re, MD Authorized by: Rudene Re, MD     Interpretation: normal     ECG rate assessment: normal     Rhythm: sinus rhythm      Ectopy: none     Conduction: normal     Critical Care performed:  None ____________________________________________   INITIAL IMPRESSION / ASSESSMENT AND PLAN / ED COURSE  69 y.o. female with a history of neck arthritis, GERD, Hashimoto's thyroiditis, hypothyroidism, hyperlipidemia, SVT who presents for evaluation of several complaints including generalized weakness, lightheadedness, palpitations, upper abdominal and chest burning pain, nausea, HA. Symptoms started after patient was several hours working in her yard.  She feels that she could be dehydrated.  She reports similar symptoms in the past with dehydration.  At this time her only complaint is nausea and burning epigastric/chest pain.  All her other symptoms have resolved.  Patient is extremely well-appearing in no distress with normal vital signs, completely neurologically intact, abdomen is soft with no tenderness throughout, no distention and normal bowel sounds.  EKG showing no signs of dysrhythmias or ischemia.  Patient placed on telemetry for monitoring of possible dysrhythmia/SVT since patient had palpitations earlier today.  High-sensitivity troponin x2 is negative.  LFTs showing no acute abnormalities.  Lipase added on.  Normal white count, no anemia, no dehydration, no AKI, no electrolyte abnormalities.  Chest x-ray with no acute abnormalities, confirmed by radiology.  Differential diagnoses including dehydration, possible dysrhythmia or SVT.  Low suspicion for stroke with bilateral symptoms and completely intact neurological exam.  Head CT was done in triage showing no acute findings, confirmed by radiology.  Low suspicion for dissection with a normal pulses in all 4 extremities, normal neuro exam, no significantly elevated blood pressure, no pain radiating to the back, normal mediastinum on chest x-ray.  Low suspicion for an acute intra-abdominal pathology requiring CT imaging such as cholecystitis, appendicitis, SBO, perforated ulcer  based on extremely benign abdominal exam. Will give IVF, IV zofran, IV pepcid, and PO maalox and reassess.   Old medical records reviewed.  Patient placed on telemetry for close monitoring.     _________________________ 1:13 AM on 10/15/2019 -----------------------------------------  Patient reports that the burning in her abdomen has resolved but she still feels distended with a lot of gas and is still nauseated.  Without significant improvement of her symptoms we will pursue a CT to rule out any more concerning etiologies for her pain.  _________________________ 4:10 AM on 10/15/2019 -----------------------------------------  CT showing no significant findings, confirmed by radiology.  UA  negative for UTI.  Labs with no acute findings.  Patient monitored on telemetry for several hours with no evidence of SVT or any other dysrhythmias.  Most likely mild dehydration.  Remains neurologically intact.  At this time, patient is stable for discharge home with close follow-up with primary care doctor.  Discussed my standard return precautions, increase oral hydration.   _____________________________________________ Please note:  Patient was evaluated in Emergency Department today for the symptoms described in the history of present illness. Patient was evaluated in the context of the global COVID-19 pandemic, which necessitated consideration that the patient might be at risk for infection with the SARS-CoV-2 virus that causes COVID-19. Institutional protocols and algorithms that pertain to the evaluation of patients at risk for COVID-19 are in a state of rapid change based on information released by regulatory bodies including the CDC and federal and state organizations. These policies and algorithms were followed during the patient's care in the ED.  Some ED evaluations and interventions may be delayed as a result of limited staffing during the pandemic.   [place] Controlled Substance Database was  reviewed by me. ____________________________________________   FINAL CLINICAL IMPRESSION(S) / ED DIAGNOSES   Final diagnoses:  Palpitations  Lightheadedness  Dehydration  Epigastric pain  Gastroesophageal reflux disease, unspecified whether esophagitis present      NEW MEDICATIONS STARTED DURING THIS VISIT:  ED Discharge Orders    None       Note:  This document was prepared using Dragon voice recognition software and may include unintentional dictation errors.    Alfred Levins, [place], MD 10/15/19 [id number]

## 2019-10-15 NOTE — Discharge Instructions (Signed)

## 2019-10-15 NOTE — ED Notes (Signed)
Pt states she is cold and shaking, patient given another warm blanket and thermostat turned to highest setting. Patient comfortable at this time, will continue to monitor.

## 2019-10-15 NOTE — ED Notes (Signed)
Pt stating she would like to leave, pt encouraged to stay but due to wait time, she refused. Advised to return if symptoms fail to improve or worsen. IV removed by this RN. Pt a/ox, resp e/u, nad. Ambulatory out of ED.

## 2019-11-04 ENCOUNTER — Telehealth: Payer: Self-pay | Admitting: Pharmacist

## 2019-11-04 MED ORDER — PRALUENT 75 MG/ML ~~LOC~~ SOAJ: 1.0000 "pen " | SUBCUTANEOUS | 11 refills | Status: DC

## 2019-11-04 NOTE — Telephone Encounter (Signed)
Pt called clinic to cancel upcoming labs since her PCP checked a lipid panel on 7/12. Results in [place] as follows: TC 123, HDL 47, LDL 41, nonHDL 76, TG 216.  LDL is excellent, TG have increased a bit - pt reports fasting, no recent alcohol intake. She does report GI symptoms and joint pain (shoulders, ankles, knees) that she is wondering if they might be related to her Repatha. States she went to the ER last month with abdominal burning and nausea. Feels worse than when she took rosuvastatin and atorvastatin. Symptoms started on month 2 of Repatha.  Will stop Repatha and advised pt to monitor for symptom improvement. Discussed trying lower dose of Praluent or ezetimibe (she still continues on max tolerated rosuvastatin dose) She wishes to try Praluent.   Prior authorization was submitted and approved, rx sent to pharmacy and pt was made aware. She will call with tolerability update so we can either schedule labs or change therapy if needed.

## 2019-11-08 ENCOUNTER — Other Ambulatory Visit: Payer: Medicare Other

## 2019-12-09 ENCOUNTER — Encounter: Payer: Self-pay | Admitting: Nurse Practitioner

## 2019-12-13 ENCOUNTER — Encounter: Payer: Self-pay | Admitting: Nurse Practitioner

## 2019-12-29 ENCOUNTER — Ambulatory Visit
Admission: EM | Admit: 2019-12-29 | Discharge: 2019-12-29 | Disposition: A | Discharge status: Home / Self Care | Payer: Medicare Other | Attending: Family Medicine | Admitting: Family Medicine

## 2019-12-29 ENCOUNTER — Other Ambulatory Visit: Payer: Self-pay

## 2019-12-29 DIAGNOSIS — J029 Acute pharyngitis, unspecified: Secondary | ICD-10-CM

## 2019-12-29 DIAGNOSIS — B349 Viral infection, unspecified: Secondary | ICD-10-CM

## 2019-12-29 DIAGNOSIS — Z1152 Encounter for screening for COVID-19: Secondary | ICD-10-CM | POA: Diagnosis not present

## 2019-12-29 DIAGNOSIS — R0981 Nasal congestion: Secondary | ICD-10-CM | POA: Diagnosis not present

## 2019-12-29 LAB — POCT RAPID STREP A (OFFICE): Rapid Strep A Screen: NEGATIVE

## 2019-12-29 MED ORDER — GUAIFENESIN 200 MG PO TABS: 200.0000 mg | ORAL_TABLET | ORAL | 0 refills | Status: DC | PRN

## 2019-12-29 NOTE — ED Triage Notes (Signed)
Pt is here with a sore throat for 2 days ago now, pt has used saline rinses and Flonase to relieve discomfort.

## 2019-12-29 NOTE — Discharge Instructions (Addendum)
Your rapid strep test is negative.  A throat culture is pending; we will call you if it is positive requiring treatment.    Your COVID test is pending.  You should self quarantine until the test result is back.    Take Tylenol as needed for fever or discomfort.  Rest and keep yourself hydrated.    Go to the emergency department if you develop acute worsening symptoms.     

## 2019-12-29 NOTE — ED Provider Notes (Signed)
[place]   [id number] 12/29/19 Arrival Time: 1729   CC: COVID symptoms  SUBJECTIVE: History from: patient .  [name] is a 69 y.o. female who presents with abrupt onset of nasal congestion, PND since 2 days ago. Denies sick exposure to COVID, flu or strep. Denies recent travel. Has negative history of Covid. Has completed Covid vaccines. Has taken OTC medications for this. There are no aggravating or alleviating factors. Denies previous symptoms in the past. Denies fever, chills, fatigue, sinus pain, rhinorrhea, SOB, wheezing, chest pain, nausea, changes in bowel or bladder habits.    ROS: As per HPI.  All other pertinent ROS negative.     Past Medical History:  Diagnosis Date  . Arthritis    Neck  . CIN I (cervical intraepithelial neoplasia I) 2017  . Complication of anesthesia    delayed SVT:  can occur from a few hours to 3 days after the general anesthesia or conscious sedation as happened with her colonoscopy.   [name] Dysplasia of cervix, low grade (CIN 1)    LEEP cervical conization September 2017 margins negative CIN-1  . GERD (gastroesophageal reflux disease)   . Hashimoto's thyroiditis   . History of carpal tunnel syndrome    Left  . History of colon polyps 2010   Benign  . Hyperlipidemia   . Hypothyroidism    Hashimoto's per pt   . Osteopenia 10/2017   T score -1.9 FRAX 8.8% / 0.7%.  Stable from prior DEXA.  [name] SVT (supraventricular tachycardia) ([facility])   . Wears glasses    Past Surgical History:  Procedure Laterality Date  . BREAST SURGERY  1976   CYST REMOVED  . CARDIAC ELECTROPHYSIOLOGY STUDY AND ABLATION  2001  . CESAREAN SECTION  1987  . COLONOSCOPY    . COLONOSCOPY WITH PROPOFOL N/A 02/12/2019   Procedure: COLONOSCOPY WITH PROPOFOL;  Surgeon: Wonda Horner, MD;  Location: [facility];  Service: Endoscopy;  Laterality: N/A;  . LEEP  2017  . left thumb surgery and Carpal Tunnel Release  9/14  . lower lid surgery   09/2018   dry eye  from Hashimoto's  . POLYPECTOMY  02/12/2019   Procedure: POLYPECTOMY;  Surgeon: Wonda Horner, MD;  Location: [facility];  Service: Endoscopy;;   Allergies  Allergen Reactions  . Lidocaine     Other reaction(s): Tachycardia  . Lipitor [Atorvastatin] Other (See Comments)    Muscle aches  . Repatha [Evolocumab]     Joint pain  . Diphenhydramine Palpitations  . Epinephrine Palpitations   No current facility-administered medications on file prior to encounter.   Current Outpatient Medications on File Prior to Encounter  Medication Sig Dispense Refill  . Alirocumab (PRALUENT) 75 MG/ML SOAJ Inject 1 pen into the skin every 14 (fourteen) days. 2 mL 11  . Cholecalciferol (VITAMIN D-3) 5000 units TABS Take 5,000 mg by mouth See admin instructions. Monday and Friday    . fish oil-omega-3 fatty acids 1000 MG capsule Take 1 g by mouth daily.    . fluticasone (FLONASE) 50 MCG/ACT nasal spray Place 1 spray into both nostrils as needed for allergies.     . hydrocortisone (ANUSOL-HC) 25 MG suppository Place 1 suppository (25 mg total) rectally 2 (two) times daily. (Patient taking differently: Place 25 mg rectally 2 (two) times daily as needed for hemorrhoids. ) 20 suppository 0  . levothyroxine (SYNTHROID, LEVOTHROID) 75 MCG tablet Take 75 mcg by mouth daily before breakfast.    . metoprolol succinate (TOPROL-XL)  50 MG 24 hr tablet Take 1 tablet (50 mg total) by mouth daily. Take with or immediately following a meal. 90 tablet 3  . Multiple Vitamin (MULTIVITAMIN WITH MINERALS) TABS tablet Take 1 tablet by mouth daily.    . polyethylene glycol (MIRALAX / GLYCOLAX) packet Take 17 g by mouth daily as needed for mild constipation.     . Probiotic Product (PROBIOTIC ADVANCED PO) Take 1 capsule by mouth daily.     . rosuvastatin (CRESTOR) 10 MG tablet Take 10 mg by mouth 3 (three) times a week. Every Monday, Wednesday, and Friday in the evenings.     Social History   Socioeconomic History  . Marital  status: Widowed    Spouse name: Not on file  . Number of children: 1  . Years of education: Not on file  . Highest education level: Master's degree (e.g., MA, MS, MEng, MEd, MSW, MBA)  Occupational History  . Not on file  Tobacco Use  . Smoking status: Never Smoker  . Smokeless tobacco: Never Used  Vaping Use  . Vaping Use: Never used  Substance and Sexual Activity  . Alcohol use: Not Currently  . Drug use: No  . Sexual activity: Not Currently    Partners: Male    Birth control/protection: Post-menopausal    Comment: 69 YEARS OLD, NO MORE THAN 5 PARTNERS ,des neg  Other Topics Concern  . Not on file  Social History Narrative   Her daughter lives at home with her    Right handed   Caffeine: coffee, soda, (about 1 cup/day)   Social Determinants of Health   Financial Resource Strain:   . Difficulty of Paying Living Expenses: Not on file  Food Insecurity:   . Worried About Charity fundraiser in the Last Year: Not on file  . Ran Out of Food in the Last Year: Not on file  Transportation Needs:   . Lack of Transportation (Medical): Not on file  . Lack of Transportation (Non-Medical): Not on file  Physical Activity:   . Days of Exercise per Week: Not on file  . Minutes of Exercise per Session: Not on file  Stress:   . Feeling of Stress : Not on file  Social Connections:   . Frequency of Communication with Friends and Family: Not on file  . Frequency of Social Gatherings with Friends and Family: Not on file  . Attends Religious Services: Not on file  . Active Member of Clubs or Organizations: Not on file  . Attends Archivist Meetings: Not on file  . Marital Status: Not on file  Intimate Partner Violence:   . Fear of Current or Ex-Partner: Not on file  . Emotionally Abused: Not on file  . Physically Abused: Not on file  . Sexually Abused: Not on file   Family History  Problem Relation Age of Onset  . Hypertension Mother   . Heart disease Mother   . Mitral  valve prolapse Mother   . Glaucoma Mother   . Arthritis Mother   . Von Willebrand disease Mother   . Stroke Mother   . Rheum arthritis Mother   . Neuropathy Mother   . Hypertension Father   . Heart disease Father   . Colon polyps Father   . Glaucoma Father   . Arthritis Father   . Stroke Father   . Rheum arthritis Father   . Pulmonary embolism Father   . Hypertension Brother   . Mitral valve prolapse Brother   .  Glaucoma Brother   . Arthritis Brother   . Arthritis Sister     OBJECTIVE:  Vitals:   12/29/19 1735  BP: 130/79  Pulse: 79  Resp: 18  Temp: 98.7 F (37.1 C)  TempSrc: Oral  SpO2: 95%     General appearance: alert; appears fatigued, but nontoxic; speaking in full sentences and tolerating own secretions HEENT: NCAT; Ears: EACs clear, TMs pearly gray; Eyes: PERRL.  EOM grossly intact. Sinuses: nontender; Nose: nares patent without rhinorrhea, Throat: oropharynx clear, tonsils non erythematous or enlarged, uvula midline  Neck: supple without LAD Lungs: unlabored respirations, symmetrical air entry; cough: absent; no respiratory distress; CTAB Heart: regular rate and rhythm.  Radial pulses 2+ symmetrical bilaterally Skin: warm and dry Psychological: alert and cooperative; normal mood and affect  LABS:  No results found for this or any previous visit (from the past 24 hour(s)).   ASSESSMENT & PLAN:  1. Viral illness   2. Sore throat   3. Nasal congestion   4. Encounter for screening for COVID-19     Meds ordered this encounter  Medications  . guaiFENesin 200 MG tablet    Sig: Take 1 tablet (200 mg total) by mouth every 4 (four) hours as needed for cough or to loosen phlegm.    Dispense:  30 suppository    Refill:  0    Order Specific Question:   Supervising Provider    Answer:   Chase Picket [id number]   Prescribed guaifenesin  Your rapid strep test is negative.    A throat culture is pending; we will call you if it is positive requiring  treatment.     COVID testing ordered.  It will take between 1-2 days for test results.  Someone will contact you regarding abnormal results.    Patient should remain in quarantine until they have received Covid results.  If negative you may resume normal activities (go back to work/school) while practicing hand hygiene, social distance, and mask wearing.  If positive, patient should remain in quarantine for 10 days from symptom onset AND greater than 72 hours after symptoms resolution (absence of fever without the use of fever-reducing medication and improvement in respiratory symptoms), whichever is longer  Get plenty of rest and push fluids Use OTC zyrtec for nasal congestion, runny nose, and/or sore throat Use OTC flonase for nasal congestion and runny nose Use medications daily for symptom relief Use OTC medications like ibuprofen or tylenol as needed fever or pain  Call or go to the ED if you have any new or worsening symptoms such as fever, worsening cough, shortness of breath, chest tightness, chest pain, turning blue, changes in mental status.  Reviewed expectations re: course of current medical issues. Questions answered.  Outlined signs and symptoms indicating need for more acute intervention. Patient verbalized understanding. After Visit Summary given.         Faustino Congress, NP 12/29/19 [phone]

## 2019-12-31 LAB — SARS-COV-2, NAA 2 DAY TAT

## 2019-12-31 LAB — NOVEL CORONAVIRUS, NAA: SARS-CoV-2, NAA: NOT DETECTED

## 2020-01-01 LAB — CULTURE, GROUP A STREP (THRC)

## 2020-01-04 ENCOUNTER — Ambulatory Visit: Payer: Medicare Other | Admitting: Nurse Practitioner

## 2020-01-04 ENCOUNTER — Encounter: Payer: Self-pay | Admitting: Nurse Practitioner

## 2020-01-04 ENCOUNTER — Encounter: Payer: Medicare Other | Admitting: Nurse Practitioner

## 2020-01-04 ENCOUNTER — Other Ambulatory Visit: Payer: Self-pay

## 2020-01-04 VITALS — BP 120/83 | Ht 62.0 in | Wt 174.4 lb

## 2020-01-04 DIAGNOSIS — R87810 Cervical high risk human papillomavirus (HPV) DNA test positive: Secondary | ICD-10-CM | POA: Diagnosis not present

## 2020-01-04 DIAGNOSIS — Z9889 Other specified postprocedural states: Secondary | ICD-10-CM | POA: Diagnosis not present

## 2020-01-04 DIAGNOSIS — Z01419 Encounter for gynecological examination (general) (routine) without abnormal findings: Secondary | ICD-10-CM | POA: Diagnosis not present

## 2020-01-04 DIAGNOSIS — Z780 Asymptomatic menopausal state: Secondary | ICD-10-CM

## 2020-01-04 DIAGNOSIS — M8589 Other specified disorders of bone density and structure, multiple sites: Secondary | ICD-10-CM | POA: Diagnosis not present

## 2020-01-04 NOTE — Patient Instructions (Signed)
Osteopenia  Osteopenia is a loss of thickness (density) inside of the bones. Another name for osteopenia is low bone mass. Mild osteopenia is a normal part of aging. It is not a disease, and it does not cause symptoms. However, if you have osteopenia and continue to lose bone mass, you could develop a condition that causes the bones to become thin and break more easily (osteoporosis). You may also lose some height, have back pain, and have a stooped posture. Although osteopenia is not a disease, making changes to your lifestyle and diet can help to prevent osteopenia from developing into osteoporosis. What are the causes? Osteopenia is caused by loss of calcium in the bones.  Bones are constantly changing. Old bone cells are continually being replaced with new bone cells. This process builds new bone. The mineral calcium is needed to build new bone and maintain bone density. Bone density is usually highest around age 35. After that, most people's bodies cannot replace all the bone they have lost with new bone. What increases the risk? You are more likely to develop this condition if:  You are older than age 50.  You are a woman who went through menopause early.  You have a long illness that keeps you in bed.  You do not get enough exercise.  You lack certain nutrients (malnutrition).  You have an overactive thyroid gland (hyperthyroidism).  You smoke.  You drink a lot of alcohol.  You are taking medicines that weaken the bones, such as steroids. What are the signs or symptoms? This condition does not cause any symptoms. You may have a slightly higher risk for bone breaks (fractures), so getting fractures more easily than normal may be an indication of osteopenia. How is this diagnosed? Your health care provider can diagnose this condition with a special type of X-ray exam that measures bone density (dual-energy X-ray absorptiometry, DEXA). This test can measure bone density in your  hips, spine, and wrists. Osteopenia has no symptoms, so this condition is usually diagnosed after a routine bone density screening test is done for osteoporosis. This routine screening is usually done for:  Women who are age 65 or older.  Men who are age 70 or older. If you have risk factors for osteopenia, you may have the screening test at an earlier age. How is this treated? Making dietary and lifestyle changes can lower your risk for osteoporosis. If you have severe osteopenia that is close to becoming osteoporosis, your health care provider may prescribe medicines and dietary supplements such as calcium and vitamin D. These supplements help to rebuild bone density. Follow these instructions at home:   Take over-the-counter and prescription medicines only as told by your health care provider. These include vitamins and supplements.  Eat a diet that is high in calcium and vitamin D. ? Calcium is found in dairy products, beans, salmon, and leafy green vegetables like spinach and broccoli. ? Look for foods that have vitamin D and calcium added to them (fortified foods), such as orange juice, cereal, and bread.  Do 30 or more minutes of a weight-bearing exercise every day, such as walking, jogging, or playing a sport. These types of exercises strengthen the bones.  Take precautions at home to lower your risk of falling, such as: ? Keeping rooms well-lit and free of clutter, such as cords. ? Installing safety rails on stairs. ? Using rubber mats in the bathroom or other areas that are often wet or slippery.  Do not use   any products that contain nicotine or tobacco, such as cigarettes and e-cigarettes. If you need help quitting, ask your health care provider.  Avoid alcohol or limit alcohol intake to no more than 1 drink a day for nonpregnant women and 2 drinks a day for men. One drink equals 12 oz of beer, 5 oz of wine, or 1 oz of hard liquor.  Keep all follow-up visits as told by your  health care provider. This is important. Contact a health care provider if:  You have not had a bone density screening for osteoporosis and you are: ? A woman, age 65 or older. ? A man, age 70 or older.  You are a postmenopausal woman who has not had a bone density screening for osteoporosis.  You are older than age 50 and you want to know if you should have bone density screening for osteoporosis. Summary  Osteopenia is a loss of thickness (density) inside of the bones. Another name for osteopenia is low bone mass.  Osteopenia is not a disease, but it may increase your risk for a condition that causes the bones to become thin and break more easily (osteoporosis).  You may be at risk for osteopenia if you are older than age 50 or if you are a woman who went through early menopause.  Osteopenia does not cause any symptoms, but it can be diagnosed with a bone density screening test.  Dietary and lifestyle changes are the first treatment for osteopenia. These may lower your risk for osteoporosis. This information is not intended to replace advice given to you by your health care provider. Make sure you discuss any questions you have with your health care provider. Document Revised: 03/21/2017 Document Reviewed: 01/15/2017 Elsevier Patient Education  2020 Elsevier Inc[employer] Health Maintenance After Age 65 After age 65, you are at a higher risk for certain long-term diseases and infections as well as injuries from falls. Falls are a major cause of broken bones and head injuries in people who are older than age 65. Getting regular preventive care can help to keep you healthy and well. Preventive care includes getting regular testing and making lifestyle changes as recommended by your health care provider. Talk with your health care provider about:  Which screenings and tests you should have. A screening is a test that checks for a disease when you have no symptoms.  A diet and exercise plan that  is right for you. What should I know about screenings and tests to prevent falls? Screening and testing are the best ways to find a health problem early. Early diagnosis and treatment give you the best chance of managing medical conditions that are common after age 65. Certain conditions and lifestyle choices may make you more likely to have a fall. Your health care provider may recommend:  Regular vision checks. Poor vision and conditions such as cataracts can make you more likely to have a fall. If you wear glasses, make sure to get your prescription updated if your vision changes.  Medicine review. Work with your health care provider to regularly review all of the medicines you are taking, including over-the-counter medicines. Ask your health care provider about any side effects that may make you more likely to have a fall. Tell your health care provider if any medicines that you take make you feel dizzy or sleepy.  Osteoporosis screening. Osteoporosis is a condition that causes the bones to get weaker. This can make the bones weak and cause them to break   more easily.  Blood pressure screening. Blood pressure changes and medicines to control blood pressure can make you feel dizzy.  Strength and balance checks. Your health care provider may recommend certain tests to check your strength and balance while standing, walking, or changing positions.  Foot health exam. Foot pain and numbness, as well as not wearing proper footwear, can make you more likely to have a fall.  Depression screening. You may be more likely to have a fall if you have a fear of falling, feel emotionally low, or feel unable to do activities that you used to do.  Alcohol use screening. Using too much alcohol can affect your balance and may make you more likely to have a fall. What actions can I take to lower my risk of falls? General instructions  Talk with your health care provider about your risks for falling. Tell your  health care provider if: ? You fall. Be sure to tell your health care provider about all falls, even ones that seem minor. ? You feel dizzy, sleepy, or off-balance.  Take over-the-counter and prescription medicines only as told by your health care provider. These include any supplements.  Eat a healthy diet and maintain a healthy weight. A healthy diet includes low-fat dairy products, low-fat (lean) meats, and fiber from whole grains, beans, and lots of fruits and vegetables. Home safety  Remove any tripping hazards, such as rugs, cords, and clutter.  Install safety equipment such as grab bars in bathrooms and safety rails on stairs.  Keep rooms and walkways well-lit. Activity   Follow a regular exercise program to stay fit. This will help you maintain your balance. Ask your health care provider what types of exercise are appropriate for you.  If you need a cane or walker, use it as recommended by your health care provider.  Wear supportive shoes that have nonskid soles. Lifestyle  Do not drink alcohol if your health care provider tells you not to drink.  If you drink alcohol, limit how much you have: ? 0-1 drink a day for women. ? 0-2 drinks a day for men.  Be aware of how much alcohol is in your drink. In the [place] one drink equals one typical bottle of beer (12 oz), one-half glass of wine (5 oz), or one shot of hard liquor (1 oz).  Do not use any products that contain nicotine or tobacco, such as cigarettes and e-cigarettes. If you need help quitting, ask your health care provider. Summary  Having a healthy lifestyle and getting preventive care can help to protect your health and wellness after age 65.  Screening and testing are the best way to find a health problem early and help you avoid having a fall. Early diagnosis and treatment give you the best chance for managing medical conditions that are more common for people who are older than age 65.  Falls are a major cause  of broken bones and head injuries in people who are older than age 65. Take precautions to prevent a fall at home.  Work with your health care provider to learn what changes you can make to improve your health and wellness and to prevent falls. This information is not intended to replace advice given to you by your health care provider. Make sure you discuss any questions you have with your health care provider. Document Revised: 07/30/2018 Document Reviewed: 02/19/2017 Elsevier Patient Education  2020 Elsevier Inc.  

## 2020-01-04 NOTE — Progress Notes (Signed)
   [name] 11-19-1950 [id number]   History:  69 y.o. G1P1001 presents for breast and pelvic exam without GYN complaints. Postmenopausal - no HRT, no bleeding. 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 History of osteopenia, hypothyroidism.   Gynecologic History Patient's last menstrual period was 11/26/2000.   Contraception: post menopausal status Last Pap: 12/30/2018. Results were: normal Last mammogram: 12/09/2019. Results were: left lymph node/indeterminate, do not have Ultrasound report but patient reports normal Last colonoscopy: 02/12/2019. Results were: polyp Last Dexa: 10/21/2017. Results were: t-score -1.9, FRAX 8.8% / 0.7%  Past medical history, past surgical history, family history and social history were all reviewed and documented in the EPIC chart.  ROS:  A ROS was performed and pertinent positives and negatives are included.  Exam:  Vitals:   01/04/20 1042  BP: 120/83  Weight: 174 lb 6.4 oz (79.1 kg)  Height: 5\' 2"  (1.575 m)   Body mass index is 31.9 kg/m.  General appearance:  Normal Thyroid:  Symmetrical, normal in size, without palpable masses or nodularity. Respiratory  Auscultation:  Clear without wheezing or rhonchi Cardiovascular  Auscultation:  Regular rate, without rubs, murmurs or gallops  Edema/varicosities:  Not grossly evident Abdominal  Soft,nontender, without masses, guarding or rebound.  Liver/spleen:  No organomegaly noted  Hernia:  None appreciated  Skin  Inspection:  Grossly normal   Breasts: Examined lying and sitting.   Right: Without masses, retractions, discharge or axillary adenopathy.   Left: Without masses, retractions, discharge or axillary adenopathy. Gentitourinary   Inguinal/mons:  Normal without inguinal adenopathy  External genitalia:  Normal  BUS/Urethra/Skene's glands:  Normal  Vagina:  Normal  Cervix:  Normal  Uterus:  Normal in size, shape and contour.  Midline and  mobile  Adnexa/parametria:     Rt: Without masses or tenderness.   Lt: Without masses or tenderness.  Anus and perineum: Normal  Digital rectal exam: Normal sphincter tone without palpated masses or tenderness  Assessment/Plan:  69 y.o. G1P1001 for breast and pelvic exam.   Well female exam with routine gynecological exam - Education provided on SBEs, importance of preventative screenings, current guidelines, high calcium diet, regular exercise, and multivitamin daily. Labs done elsewhere.  History of loop electrical excision procedure (LEEP) - 2017 for persistent CIN-1, Pap with HR HPV typing today.   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 She will schedule this soon. Taking daily Vitamin D and trying to walk often depending on weather. Recommend weightbearing exercises, fall prevention, and home safety.   Postmenopausal - Plan: DG Bone Density  Follow up in 1 year for annual     [place], 10:49 AM 01/04/2020

## 2020-01-04 NOTE — Addendum Note (Signed)
Addended by: Thamas Jaegers on: 01/04/2020 11:22 AM   Modules accepted: Orders

## 2020-01-12 LAB — PAP, TP IMAGING W/ HPV RNA, RFLX HPV TYPE 16,18/45: HPV DNA High Risk: DETECTED — AB

## 2020-01-12 LAB — HPV TYPE 16 AND 18/45 RNA
HPV Type 16 RNA: DETECTED — AB
HPV Type 18/45 RNA: NOT DETECTED

## 2020-01-14 ENCOUNTER — Ambulatory Visit: Payer: Medicare Other | Admitting: Physical Therapy

## 2020-01-17 ENCOUNTER — Ambulatory Visit: Payer: Medicare Other | Admitting: Physical Therapy

## 2020-01-19 ENCOUNTER — Encounter: Payer: Medicare Other | Admitting: Physical Therapy

## 2020-01-26 ENCOUNTER — Encounter: Payer: Medicare Other | Admitting: Physical Therapy

## 2020-02-02 ENCOUNTER — Encounter: Payer: Medicare Other | Admitting: Physical Therapy

## 2020-02-02 ENCOUNTER — Ambulatory Visit (INDEPENDENT_AMBULATORY_CARE_PROVIDER_SITE_OTHER): Payer: Medicare Other

## 2020-02-02 ENCOUNTER — Other Ambulatory Visit: Payer: Self-pay

## 2020-02-02 ENCOUNTER — Other Ambulatory Visit: Payer: Self-pay | Admitting: Nurse Practitioner

## 2020-02-02 DIAGNOSIS — M8589 Other specified disorders of bone density and structure, multiple sites: Secondary | ICD-10-CM

## 2020-02-02 DIAGNOSIS — Z780 Asymptomatic menopausal state: Secondary | ICD-10-CM | POA: Diagnosis not present

## 2020-02-04 ENCOUNTER — Other Ambulatory Visit: Payer: Self-pay

## 2020-02-04 ENCOUNTER — Ambulatory Visit: Payer: Medicare Other | Admitting: Obstetrics & Gynecology

## 2020-02-04 ENCOUNTER — Encounter: Payer: Self-pay | Admitting: Obstetrics & Gynecology

## 2020-02-04 VITALS — BP 140/88

## 2020-02-04 DIAGNOSIS — R87810 Cervical high risk human papillomavirus (HPV) DNA test positive: Secondary | ICD-10-CM

## 2020-02-04 NOTE — Progress Notes (Signed)
    [name] 15-Jul-1950 [id number]        69 y.o.  G1P1001   RP: HR HPV positive for Colposcopy  HPI: Pap 12/2019 Neg/HPV HR positive.  Previous Paps normal.   OB History  Gravida Para Term Preterm AB Living  1 1 1     1   SAB TAB Ectopic Multiple Live Births               # Outcome Date GA Lbr Len/2nd Weight Sex Delivery Anes PTL Lv  1 Term             Past medical history,surgical history, problem list, medications, allergies, family history and social history were all reviewed and documented in the EPIC chart.   Directed ROS with pertinent positives and negatives documented in the history of present illness/assessment and plan.  Exam:  Vitals:   02/04/20 1540  BP: 140/88   General appearance:  Normal  Colposcopy Procedure Note Shatara Stanek 02/04/2020  Indications: HR HPV pos  Procedure Details  The risks and benefits of the procedure and Verbal informed consent obtained.  Speculum placed in vagina and excellent visualization of cervix achieved, cervix swabbed x 3 with acetic acid solution.  Findings:  Cervix colposcopy: Physical Exam Genitourinary:       Vaginal colposcopy: Normal  Vulvar colposcopy: Normal  Perirectal colposcopy: Normal  The cervix was sprayed with Hurricane before performing the cervical biopsies.  Specimens: HPV 16-18-45.  Cervical Bx 5 O'Clock.  Complications: None, good hemostasis with Silver Nitrate . Plan: Management per results   Assessment/Plan:  69 y.o. G1P1001   1. Cervical high risk HPV (human papillomavirus) test positive Pap test September 2021 was negative with high risk HPV positive.  Counseling on high risk HPV done.  HPV 16-18-45 added today.  Colposcopy procedure explained.  Colposcopy findings reviewed with patient.  Management per cervical biopsy results as well as HPV 16-18-45 results.  Postprocedure precautions reviewed.  Other orders - Pathology Report ([facility]) - HPV Type 16  and 18/45 RNA  Princess Bruins MD, 3:44 PM 02/04/2020

## 2020-02-06 ENCOUNTER — Encounter: Payer: Self-pay | Admitting: Obstetrics & Gynecology

## 2020-02-09 ENCOUNTER — Encounter: Payer: Medicare Other | Admitting: Physical Therapy

## 2020-02-09 LAB — PATHOLOGY REPORT

## 2020-02-09 LAB — TISSUE SPECIMEN

## 2020-02-10 LAB — HPV TYPE 16 AND 18/45 RNA
HPV Type 16 RNA: NOT DETECTED
HPV Type 18/45 RNA: NOT DETECTED

## 2020-02-16 ENCOUNTER — Encounter: Payer: Medicare Other | Admitting: Physical Therapy

## 2020-02-16 ENCOUNTER — Other Ambulatory Visit: Payer: Self-pay | Admitting: Sports Medicine

## 2020-02-16 DIAGNOSIS — M545 Low back pain, unspecified: Secondary | ICD-10-CM

## 2020-02-21 ENCOUNTER — Encounter: Payer: Medicare Other | Admitting: Physical Therapy

## 2020-02-25 ENCOUNTER — Encounter: Payer: Medicare Other | Admitting: Physical Therapy

## 2020-02-28 ENCOUNTER — Encounter: Payer: Medicare Other | Admitting: Physical Therapy

## 2020-03-03 ENCOUNTER — Encounter: Payer: Medicare Other | Admitting: Physical Therapy

## 2020-03-10 ENCOUNTER — Ambulatory Visit
Admission: RE | Admit: 2020-03-10 | Discharge: 2020-03-10 | Disposition: A | Discharge status: Home / Self Care | Payer: Medicare Other | Source: Ambulatory Visit | Attending: Sports Medicine | Admitting: Sports Medicine

## 2020-03-10 DIAGNOSIS — M545 Low back pain, unspecified: Secondary | ICD-10-CM

## 2020-03-20 ENCOUNTER — Other Ambulatory Visit: Payer: Self-pay | Admitting: Sports Medicine

## 2020-03-20 DIAGNOSIS — G8929 Other chronic pain: Secondary | ICD-10-CM

## 2020-03-20 DIAGNOSIS — M545 Low back pain, unspecified: Secondary | ICD-10-CM

## 2020-03-31 ENCOUNTER — Other Ambulatory Visit: Payer: Self-pay

## 2020-03-31 ENCOUNTER — Ambulatory Visit
Admission: RE | Admit: 2020-03-31 | Discharge: 2020-03-31 | Disposition: A | Discharge status: Home / Self Care | Payer: Medicare Other | Source: Ambulatory Visit | Attending: Sports Medicine | Admitting: Sports Medicine

## 2020-03-31 DIAGNOSIS — M545 Low back pain, unspecified: Secondary | ICD-10-CM

## 2020-03-31 DIAGNOSIS — G8929 Other chronic pain: Secondary | ICD-10-CM

## 2020-03-31 MED ORDER — IOPAMIDOL (ISOVUE-M 200) INJECTION 41%
1.0000 mL | Freq: Once | INTRAMUSCULAR | Status: AC
  Administered 2020-03-31: 1 mL via EPIDURAL

## 2020-03-31 MED ORDER — METHYLPREDNISOLONE ACETATE 40 MG/ML INJ SUSP (RADIOLOG
120.0000 mg | Freq: Once | INTRAMUSCULAR | Status: AC
  Administered 2020-03-31: 120 mg via EPIDURAL

## 2020-03-31 NOTE — Discharge Instructions (Signed)

## 2020-04-04 DIAGNOSIS — E785 Hyperlipidemia, unspecified: Secondary | ICD-10-CM

## 2020-04-05 ENCOUNTER — Telehealth: Payer: Self-pay | Admitting: Cardiology

## 2020-04-05 NOTE — Telephone Encounter (Signed)
Patient requesting to switch from Dr. [name] to Dr. Johney Frame.

## 2020-04-06 ENCOUNTER — Other Ambulatory Visit: Payer: Medicare Other

## 2020-04-06 ENCOUNTER — Other Ambulatory Visit: Payer: Self-pay

## 2020-04-06 DIAGNOSIS — E785 Hyperlipidemia, unspecified: Secondary | ICD-10-CM

## 2020-04-06 LAB — LIPID PANEL
Chol/HDL Ratio: 2.6 ratio (ref 0.0–4.4)
Cholesterol, Total: 187 mg/dL (ref 100–199)
HDL: 72 mg/dL (ref >39)
LDL Chol Calc (NIH): 91 mg/dL (ref 0–99)
Triglycerides: 140 mg/dL (ref 0–149)
VLDL Cholesterol Cal: 24 mg/dL (ref 5–40)

## 2020-04-06 NOTE — Telephone Encounter (Signed)
I am fine with that I have not seen her in years

## 2020-04-28 ENCOUNTER — Ambulatory Visit: Payer: Medicare Other | Attending: Internal Medicine

## 2020-04-28 DIAGNOSIS — Z23 Encounter for immunization: Secondary | ICD-10-CM

## 2020-04-28 NOTE — Progress Notes (Signed)
   Covid-19 Vaccination Clinic  [name]    MRN: [id number] DOB: 1951-01-20  04/28/2020  [name] was observed post Covid-19 immunization for 15 minutes without incident. She was provided with Vaccine Information Sheet and instruction to access the V-Safe system.   [name] was instructed to call 911 with any severe reactions post vaccine: [name] Difficulty breathing  . Swelling of face and throat  . A fast heartbeat  . A bad rash all over body  . Dizziness and weakness   Immunizations Administered    Name Date Dose VIS Date Route   Pfizer COVID-19 Vaccine 04/28/2020  3:38 PM 0.3 mL 02/09/2020 Intramuscular   Manufacturer: [place]   Lot: [id number]   NDC: [address]-8

## 2020-05-05 ENCOUNTER — Emergency Department: Payer: Medicare Other

## 2020-05-05 ENCOUNTER — Other Ambulatory Visit: Payer: Self-pay

## 2020-05-05 ENCOUNTER — Emergency Department
Admission: EM | Admit: 2020-05-05 | Discharge: 2020-05-05 | Disposition: A | Discharge status: Home / Self Care | Payer: Medicare Other | Attending: Emergency Medicine | Admitting: Emergency Medicine

## 2020-05-05 DIAGNOSIS — Z5321 Procedure and treatment not carried out due to patient leaving prior to being seen by health care provider: Secondary | ICD-10-CM | POA: Diagnosis not present

## 2020-05-05 DIAGNOSIS — R202 Paresthesia of skin: Secondary | ICD-10-CM | POA: Insufficient documentation

## 2020-05-05 DIAGNOSIS — R519 Headache, unspecified: Secondary | ICD-10-CM | POA: Diagnosis not present

## 2020-05-05 LAB — BASIC METABOLIC PANEL
Anion gap: 12 (ref 5–15)
BUN: 19 mg/dL (ref 8–23)
CO2: 26 mmol/L (ref 22–32)
Calcium: 10.5 mg/dL — ABNORMAL HIGH (ref 8.9–10.3)
Chloride: 103 mmol/L (ref 98–111)
Creatinine, Ser: 0.72 mg/dL (ref 0.44–1.00)
GFR, Estimated: >60 mL/min (ref >60)
Glucose, Bld: 102 mg/dL — ABNORMAL HIGH (ref 70–99)
Potassium: 3.9 mmol/L (ref 3.5–5.1)
Sodium: 141 mmol/L (ref 135–145)

## 2020-05-05 LAB — CBC
HCT: 38.8 % (ref 36.0–46.0)
Hemoglobin: 13.6 g/dL (ref 12.0–15.0)
MCH: 29.1 pg (ref 26.0–34.0)
MCHC: 35.1 g/dL (ref 30.0–36.0)
MCV: 83.1 fL (ref 80.0–100.0)
Platelets: 188 10*3/uL (ref 150–400)
RBC: 4.67 MIL/uL (ref 3.87–5.11)
RDW: 14.7 % (ref 11.5–15.5)
WBC: 5 10*3/uL (ref 4.0–10.5)
nRBC: 0 % (ref 0.0–0.2)

## 2020-05-05 NOTE — ED Triage Notes (Signed)
Pt states she is coming in due to head pressure and tingling up and down her left arm and leg along with the face. Pt states she saw PCP and was sent here because the provider "did not like the way my eyes looked". Pt states history of hashimotos disease

## 2020-05-05 NOTE — ED Notes (Signed)
Pt approached first nurse "you can take me off your list" and left

## 2020-05-06 ENCOUNTER — Other Ambulatory Visit: Payer: Self-pay

## 2020-05-06 ENCOUNTER — Encounter (HOSPITAL_COMMUNITY): Payer: Self-pay | Admitting: *Deleted

## 2020-05-06 ENCOUNTER — Emergency Department (HOSPITAL_COMMUNITY)
Admission: EM | Admit: 2020-05-06 | Discharge: 2020-05-06 | Disposition: A | Discharge status: Home / Self Care | Payer: Medicare Other | Attending: Emergency Medicine | Admitting: Emergency Medicine

## 2020-05-06 DIAGNOSIS — R519 Headache, unspecified: Secondary | ICD-10-CM | POA: Insufficient documentation

## 2020-05-06 DIAGNOSIS — R200 Anesthesia of skin: Secondary | ICD-10-CM | POA: Insufficient documentation

## 2020-05-06 DIAGNOSIS — Z5321 Procedure and treatment not carried out due to patient leaving prior to being seen by health care provider: Secondary | ICD-10-CM | POA: Insufficient documentation

## 2020-05-06 NOTE — ED Triage Notes (Signed)
Pt reports that around 5 pm yesterday she started having numbness and tingling on the left side of her body and on her face, also having head pressure. Says she went to [facility] but left prior to being seen. She went home but is still having head pressure. States she took her BP at home at it is elevated 178/110, not taking any medications for BP. Alert/oriented, equal strength and grip.

## 2020-05-06 NOTE — ED Notes (Signed)
Pt left without being seen.

## 2020-05-06 NOTE — ED Triage Notes (Signed)
[facility]. Pt has been having head pressure and tingling on the left side of her arm and leg, went to [facility] (labs, CT and EKG completed) for the same tonight, LWBS. Tingling has subsided, still having head pressure. En rout to ED BP 160/100, cbg 136, 18RR, 70 Hr, no cp or sob, negative stroke screen.

## 2020-05-10 ENCOUNTER — Other Ambulatory Visit: Payer: Self-pay | Admitting: Family Medicine

## 2020-05-10 ENCOUNTER — Other Ambulatory Visit (HOSPITAL_COMMUNITY): Payer: Self-pay | Admitting: Family Medicine

## 2020-05-10 DIAGNOSIS — G459 Transient cerebral ischemic attack, unspecified: Secondary | ICD-10-CM

## 2020-05-11 ENCOUNTER — Other Ambulatory Visit (HOSPITAL_COMMUNITY): Payer: Self-pay | Admitting: Family Medicine

## 2020-05-11 DIAGNOSIS — G459 Transient cerebral ischemic attack, unspecified: Secondary | ICD-10-CM

## 2020-05-11 NOTE — Telephone Encounter (Signed)
Okay with me 

## 2020-05-11 NOTE — Telephone Encounter (Signed)
Patient calling to follow up on provider switch request

## 2020-05-18 ENCOUNTER — Other Ambulatory Visit: Payer: Self-pay

## 2020-05-18 ENCOUNTER — Ambulatory Visit (HOSPITAL_COMMUNITY)
Admission: RE | Admit: 2020-05-18 | Discharge: 2020-05-18 | Disposition: A | Discharge status: Home / Self Care | Payer: Medicare Other | Source: Ambulatory Visit | Attending: Family Medicine | Admitting: Family Medicine

## 2020-05-18 DIAGNOSIS — G459 Transient cerebral ischemic attack, unspecified: Secondary | ICD-10-CM | POA: Insufficient documentation

## 2020-05-22 ENCOUNTER — Ambulatory Visit (HOSPITAL_COMMUNITY)
Admission: RE | Admit: 2020-05-22 | Discharge: 2020-05-22 | Disposition: A | Discharge status: Home / Self Care | Payer: Medicare Other | Source: Ambulatory Visit | Attending: Family Medicine | Admitting: Family Medicine

## 2020-05-22 ENCOUNTER — Encounter (HOSPITAL_COMMUNITY): Payer: Self-pay

## 2020-05-22 ENCOUNTER — Ambulatory Visit (HOSPITAL_COMMUNITY): Admission: RE | Admit: 2020-05-22 | Payer: Medicare Other | Source: Ambulatory Visit

## 2020-05-22 ENCOUNTER — Other Ambulatory Visit: Payer: Self-pay

## 2020-05-22 DIAGNOSIS — G459 Transient cerebral ischemic attack, unspecified: Secondary | ICD-10-CM

## 2020-06-07 ENCOUNTER — Ambulatory Visit: Payer: Medicare Other | Admitting: Obstetrics & Gynecology

## 2020-06-15 NOTE — Progress Notes (Signed)
Cardiology Office Note:    Date:  06/19/2020   ID:  [name], DOB 03-10-1951, MRN [id number]  PCP:  Cari Caraway, [place]  Cardiologist:  Fransico Him, MD  Advanced Practice Provider:  No care team member to display Electrophysiologist:  Will Meredith Leeds, MD    Referring MD: Cari Caraway, MD    History of Present Illness:    [name] is a 70 y.o. female with a hx of HTN, HLD, paroxysmal SVT s/p ablation, and hypothyroidism who was previously followed by Dr. [name] who presents to clinic for follow-up.  The patient has a long history of paroxysmal SVT. Underwent ablation for SVT at [facility] in 2001. Since then she has had infrequent episodes of SVT. Many of her episodes have occurred following surgical procedures after administration of anesthesia. She has had an echocardiogram on 02/02/13 which showed normal left ventricular function with ejection fraction 55-60% and no significant abnormalities. Cardiac monitor 12/17/2017 showed normal sinus rhythm, sinus bradycardia, sinus tachycardia with some nonsustained atrial tachycardia up to 13 beats and occasional PVCs with PVC burden of 1%.  Heart rate ranged from 42 to 185 bpm. Has been followed by Dr. Curt Bears with last visit in 2020 where she was doing well and was continued on her metoprolol.  Today, the patient states that her blood pressure has been running higher than usual over the past several months ranging anywhere from 130s-170s. Also had episode of SVT about a month ago. Specifically, she starts to feel fatigued, lightheaded, and then she develops the SVT where he heart is racing. Unclear trigger of this episodes as not had any recent procedures. TSH has been normal. No fevers, chills, nausea, vomiting, orthopnea, PND. Thinks it may be related to dehydration and possibly elevated blood pressure.   Past Medical History:  Diagnosis Date  . Arthritis    Neck   . CIN I (cervical intraepithelial neoplasia I) 2017  . Complication of anesthesia    delayed SVT:  can occur from a few hours to 3 days after the general anesthesia or conscious sedation as happened with her colonoscopy.   [name] Dysplasia of cervix, low grade (CIN 1)    LEEP cervical conization September 2017 margins negative CIN-1  . GERD (gastroesophageal reflux disease)   . Hashimoto's thyroiditis   . History of carpal tunnel syndrome    Left  . History of colon polyps 2010   Benign  . Hyperlipidemia   . Hypothyroidism    Hashimoto's per pt   . Osteopenia 10/2017   T score -1.9 FRAX 8.8% / 0.7%.  Stable from prior DEXA.  [name] SVT (supraventricular tachycardia) ([place])   . Wears glasses     Past Surgical History:  Procedure Laterality Date  . BREAST SURGERY  1976   CYST REMOVED  . CARDIAC ELECTROPHYSIOLOGY STUDY AND ABLATION  2001  . CESAREAN SECTION  1987  . COLONOSCOPY    . COLONOSCOPY WITH PROPOFOL N/A 02/12/2019   Procedure: COLONOSCOPY WITH PROPOFOL;  Surgeon: Wonda Horner, MD;  Location: [facility] END[facility]COPY;  Service: Endoscopy;  Laterality: N/A;  . LEEP  2017  . left thumb surgery and Carpal Tunnel Release  9/14  . lower lid surgery   09/2018   dry eye from Hashimoto's  . POLYPECTOMY  02/12/2019   Procedure: POLYPECTOMY;  Surgeon: Wonda Horner, MD;  Location: [facility];  Service: Endoscopy;;    Current Medications: Current Meds  Medication Sig  . Alirocumab (  PRALUENT) 75 MG/ML SOAJ Inject 1 pen into the skin every 14 (fourteen) days.  [name] aspirin EC 81 MG tablet Take 81 mg by mouth daily. Swallow whole.  . Cholecalciferol (VITAMIN D-3) 5000 units TABS Take 5,000 mg by mouth See admin instructions. Monday and Friday  . fluticasone (FLONASE) 50 MCG/ACT nasal spray Place 1 spray into both nostrils as needed for allergies.   [name] levothyroxine (SYNTHROID) 88 MCG tablet Take 88 mcg by mouth daily.  [name] losartan (COZAAR) 25 MG tablet Take 1 tablet (25 mg total) by mouth daily.  .  metoprolol succinate (TOPROL-XL) 50 MG 24 hr tablet Take 1 tablet (50 mg total) by mouth daily. Take with or immediately following a meal.  . Multiple Vitamin (MULTIVITAMIN WITH MINERALS) TABS tablet Take 1 tablet by mouth daily.  . polyethylene glycol (MIRALAX / GLYCOLAX) packet Take 17 g by mouth daily as needed for mild constipation.   . Probiotic Product (PROBIOTIC ADVANCED PO) Take 1 capsule by mouth daily.      Allergies:   Lidocaine, Lipitor [atorvastatin], Repatha [evolocumab], Diphenhydramine, and Epinephrine   Social History   Socioeconomic History  . Marital status: Widowed    Spouse name: Not on file  . Number of children: 1  . Years of education: Not on file  . Highest education level: Master's degree (e.g., MA, MS, MEng, MEd, MSW, MBA)  Occupational History  . Not on file  Tobacco Use  . Smoking status: Never Smoker  . Smokeless tobacco: Never Used  Vaping Use  . Vaping Use: Never used  Substance and Sexual Activity  . Alcohol use: Not Currently  . Drug use: No  . Sexual activity: Not Currently    Partners: Male    Birth control/protection: Post-menopausal    Comment: 71 YEARS OLD, NO MORE THAN 5 PARTNERS ,des neg  Other Topics Concern  . Not on file  Social History Narrative   Her daughter lives at home with her    Right handed   Caffeine: coffee, soda, (about 1 cup/day)   Social Determinants of Health   Financial Resource Strain: Not on file  Food Insecurity: Not on file  Transportation Needs: Not on file  Physical Activity: Not on file  Stress: Not on file  Social Connections: Not on file     Family History: The patient's family history includes Arthritis in her brother, father, mother, and sister; Colon polyps in her father; Glaucoma in her brother, father, and mother; Heart disease in her father and mother; Hypertension in her brother, father, and mother; Mitral valve prolapse in her brother and mother; Neuropathy in her mother; Pulmonary embolism in  her father; Rheum arthritis in her father and mother; Stroke in her father and mother; Von Willebrand disease in her mother.  ROS:   Please see the history of present illness.    Review of Systems  Constitutional: Negative for chills, fever and malaise/fatigue.  HENT: Negative for hearing loss.   Eyes: Negative for blurred vision and redness.  Respiratory: Negative for shortness of breath.   Cardiovascular: Positive for palpitations. Negative for chest pain, orthopnea, claudication, leg swelling and PND.  Gastrointestinal: Negative for melena, nausea and vomiting.  Genitourinary: Negative for flank pain.  Musculoskeletal: Negative for falls and myalgias.  Neurological: Negative for dizziness and loss of consciousness.  Endo/Heme/Allergies: Negative for polydipsia.  Psychiatric/Behavioral: Negative for substance abuse.    EKGs/Labs/Other Studies Reviewed:    The following studies were reviewed today: Cardiac monitor 01/26/18 - personally reviewed  Normal sinus rhythm, sinus bradycardia and sinus tachycardia with average heart rate 73 bpm. Heart rate ranged from 42 to 185 bpm.  Occasional PVCs with PVC burden 1%.  Nonsustained atrial tachycardia up to 13 beats.  TTE 2014: ------------------------------------------------------------  Left ventricle: The cavity size was normal. Systolic  function was normal. The estimated ejection fraction was in  the range of 55% to 60%. Wall motion was normal; there were  no regional wall motion abnormalities.   ------------------------------------------------------------  Aortic valve:  Trileaflet; normal thickness leaflets.  Mobility was not restricted. Doppler: Transvalvular  velocity was within the normal range. There was no stenosis.  No regurgitation.   ------------------------------------------------------------  Aorta: Aortic root: The aortic root was normal in size.    ------------------------------------------------------------  Mitral valve:  Structurally normal valve.  Mobility was  not restricted. Doppler: Transvalvular velocity was within  the normal range. There was no evidence for stenosis.  Trivial regurgitation.  Peak gradient: 72mm Hg (D).   ------------------------------------------------------------  Left atrium: The atrium was normal in size.   ------------------------------------------------------------  Right ventricle: The cavity size was normal. Wall thickness  was normal. Systolic function was normal.   ------------------------------------------------------------  Pulmonic valve:  Doppler: Transvalvular velocity was  within the normal range. There was no evidence for stenosis.    ------------------------------------------------------------  Tricuspid valve:  Structurally normal valve.  Doppler:  Transvalvular velocity was within the normal range. Trivial  regurgitation.   ------------------------------------------------------------  Pulmonary artery:  The main pulmonary artery was  normal-sized. Systolic pressure was within the normal range.    ------------------------------------------------------------  Right atrium: The atrium was normal in size.   ------------------------------------------------------------  Pericardium: There was no pericardial effusion.   ------------------------------------------------------------  Systemic veins:  Inferior vena cava: The vessel was normal in size.    EKG:  EKG 05/08/20: NSR   Recent Labs: 10/14/2019: ALT 23 05/05/2020: BUN 19; Creatinine, Ser 0.72; Hemoglobin 13.6; Platelets 188; Potassium 3.9; Sodium 141  Recent Lipid Panel    Component Value Date/Time   CHOL 187 04/06/2020 1113   TRIG 140 04/06/2020 1113   HDL 72 04/06/2020 1113   CHOLHDL 2.6 04/06/2020 1113   CHOLHDL 3.4 12/27/2014 1055   VLDL 26 12/27/2014 1055   LDLCALC 91 04/06/2020 1113      Physical Exam:    VS:  BP 130/90   Pulse 78   Ht 5\' 3"  (1.6 m)   Wt 175 lb 6.4 oz (79.6 kg)   LMP 11/26/2000   SpO2 97%   BMI 31.07 kg/m     Wt Readings from Last 3 Encounters:  06/19/20 175 lb 6.4 oz (79.6 kg)  01/04/20 174 lb 6.4 oz (79.1 kg)  10/14/19 170 lb (77.1 kg)     GEN:  Well nourished, well developed in no acute distress HEENT: Normal NECK: No JVD; No carotid bruits CARDIAC: RRR, no murmurs, rubs, gallops RESPIRATORY:  Clear to auscultation without rales, wheezing or rhonchi  ABDOMEN: Soft, non-tender, non-distended MUSCULOSKELETAL:  No edema; No deformity  SKIN: Warm and dry NEUROLOGIC:  Alert and oriented x 3 PSYCHIATRIC:  Normal affect   ASSESSMENT:    1. SVT (supraventricular tachycardia) ([place])   2. Primary hypertension    PLAN:    In order of problems listed above:  #Paroxysmal SVT s/p Ablation: Has long history of SVT s/p ablation at [facility] in 2001. TTE in 2014 with normal LVEF, no significant valvular disease. Had several recurrences mainly after surgical procedures and administration of anesthesia. More recent episode about 1  month ago with unclear trigger (possibly related to HTN vs dehydration vs recent increase in thyroid medication). Follows with [facility]. Has been maintained on metoprolol.  -Continue metop 50mg  XL -Start magnesium 400mg   -Discussed importance of maintaining hydration and avoiding excessive caffeine -Follow-up with Dr. Zelphia Cairo proceed with ablation if refractory symptoms  #HTN: SBPs elevated at 130-170s at home. -Continue metop as above -Start losartan 25mg  as above -BMET next week   Medication Adjustments/Labs and Tests Ordered: Current medicines are reviewed at length with the patient today.  Concerns regarding medicines are outlined above.  No orders of the defined types were placed in this encounter.  Meds ordered this encounter  Medications  . losartan (COZAAR) 25 MG tablet    Sig: Take 1 tablet (25 mg  total) by mouth daily.    Dispense:  90 tablet    Refill:  3    Patient Instructions  Medication Instructions:  Your physician has recommended you make the following change in your medication:  1.  START Losartan 25 mg taking 1 daily   *If you need a refill on your cardiac medications before your next appointment, please call your pharmacy*   Lab Work: None ordered  If you have labs (blood work) drawn today and your tests are completely normal, you will receive your results only by: [name] MyChart Message (if you have MyChart) OR . A paper copy in the mail If you have any lab test that is abnormal or we need to change your treatment, we will call you to review the results.   Testing/Procedures: None ordered   Follow-Up: At [facility], you and your health needs are our priority.  As part of our continuing mission to provide you with exceptional heart care, we have created designated Provider Care Teams.  These Care Teams include your primary Cardiologist (physician) and Advanced Practice Providers (APPs -  Physician Assistants and Nurse Practitioners) who all work together to provide you with the care you need, when you need it.  We recommend signing up for the patient portal called "MyChart".  Sign up information is provided on this After Visit Summary.  MyChart is used to connect with patients for Virtual Visits (Telemedicine).  Patients are able to view lab/test results, encounter notes, upcoming appointments, etc.  Non-urgent messages can be sent to your provider as well.   To learn more about what you can do with MyChart, go to [email or web address].    Your next appointment:   3 month(s)  The format for your next appointment:   In Person  Provider:   Gwyndolyn Kaufman, MD   Other Instructions     Signed, Freada Bergeron, MD  06/19/2020 9:29 AM    [place]

## 2020-06-19 ENCOUNTER — Ambulatory Visit: Payer: Medicare Other | Admitting: Cardiology

## 2020-06-19 ENCOUNTER — Other Ambulatory Visit: Payer: Self-pay | Admitting: Cardiology

## 2020-06-19 ENCOUNTER — Other Ambulatory Visit: Payer: Self-pay

## 2020-06-19 ENCOUNTER — Encounter: Payer: Self-pay | Admitting: Cardiology

## 2020-06-19 VITALS — BP 130/90 | HR 78 | Ht 63.0 in | Wt 175.4 lb

## 2020-06-19 DIAGNOSIS — I471 Supraventricular tachycardia: Secondary | ICD-10-CM | POA: Diagnosis not present

## 2020-06-19 DIAGNOSIS — I10 Essential (primary) hypertension: Secondary | ICD-10-CM

## 2020-06-19 MED ORDER — LOSARTAN POTASSIUM 25 MG PO TABS: 25.0000 mg | ORAL_TABLET | Freq: Every day | ORAL | 3 refills | Status: DC

## 2020-06-19 NOTE — Patient Instructions (Addendum)
Medication Instructions:  Your physician has recommended you make the following change in your medication:  1.  START Losartan 25 mg taking 1 daily   *If you need a refill on your cardiac medications before your next appointment, please call your pharmacy*   Lab Work: None ordered  If you have labs (blood work) drawn today and your tests are completely normal, you will receive your results only by: [name] MyChart Message (if you have MyChart) OR . A paper copy in the mail If you have any lab test that is abnormal or we need to change your treatment, we will call you to review the results.   Testing/Procedures: None ordered   Follow-Up: At [facility], you and your health needs are our priority.  As part of our continuing mission to provide you with exceptional heart care, we have created designated Provider Care Teams.  These Care Teams include your primary Cardiologist (physician) and Advanced Practice Providers (APPs -  Physician Assistants and Nurse Practitioners) who all work together to provide you with the care you need, when you need it.  We recommend signing up for the patient portal called "MyChart".  Sign up information is provided on this After Visit Summary.  MyChart is used to connect with patients for Virtual Visits (Telemedicine).  Patients are able to view lab/test results, encounter notes, upcoming appointments, etc.  Non-urgent messages can be sent to your provider as well.   To learn more about what you can do with MyChart, go to [email or web address].    Your next appointment:   3 month(s)  The format for your next appointment:   In Person  Provider:   Gwyndolyn Kaufman, MD   Other Instructions

## 2020-06-23 ENCOUNTER — Telehealth: Payer: Self-pay | Admitting: Physician Assistant

## 2020-06-23 ENCOUNTER — Telehealth: Payer: Self-pay | Admitting: Cardiology

## 2020-06-23 NOTE — Telephone Encounter (Signed)
Patient has been feeling poorly today with epigastric symptom and palpitation. She checked her blood pressure earlier, blood pressure was 164/98. Pulse 142. Her last dose of Toprol-XL was last night. She takes her losartan in the morning. I instructed her to take her Toprol XL right now. She will wait an hour and recheck her blood pressure and heart rate, if her heart rate is still above 100, she is instructed to take additional dose of Toprol-XL. If heart rates remain elevated after that, she is to contact cardiology on-call service for further advice. She has a history of SVT however had ablation at [facility].  Previous office note mentions follow-up with Dr. Curt Bears and can proceed with ablation if refractory symptoms.

## 2020-06-23 NOTE — Telephone Encounter (Signed)
Ms. Carrara is calling back. She has spoken to [facility] earlier for tachycardia and elevated HR 142bpm and BP to [id number] systolic. She was advised to take an extra dose of her AM Toprol XL which helped her BP to 160mmHg and HR to 92-112bpm. Patient feels fatigued and tired. She denies chest pain, presyncope or SOB. I have advised patient to come through ER if her symptoms worsen. Patient understood and is agreeable with this plan.  Signed: Hessie Knows 06/23/2020, 10:06 PM

## 2020-06-25 ENCOUNTER — Telehealth: Payer: Self-pay | Admitting: Cardiology

## 2020-06-25 ENCOUNTER — Emergency Department (HOSPITAL_COMMUNITY): Payer: Medicare Other

## 2020-06-25 ENCOUNTER — Other Ambulatory Visit: Payer: Self-pay

## 2020-06-25 ENCOUNTER — Emergency Department (HOSPITAL_COMMUNITY)
Admission: EM | Admit: 2020-06-25 | Discharge: 2020-06-25 | Disposition: A | Discharge status: Home / Self Care | Payer: Medicare Other | Attending: Emergency Medicine | Admitting: Emergency Medicine

## 2020-06-25 ENCOUNTER — Encounter (HOSPITAL_COMMUNITY): Payer: Self-pay | Admitting: Emergency Medicine

## 2020-06-25 DIAGNOSIS — E039 Hypothyroidism, unspecified: Secondary | ICD-10-CM | POA: Diagnosis not present

## 2020-06-25 DIAGNOSIS — Z79899 Other long term (current) drug therapy: Secondary | ICD-10-CM | POA: Diagnosis not present

## 2020-06-25 DIAGNOSIS — Z7982 Long term (current) use of aspirin: Secondary | ICD-10-CM | POA: Diagnosis not present

## 2020-06-25 DIAGNOSIS — R000 Tachycardia, unspecified: Secondary | ICD-10-CM | POA: Diagnosis not present

## 2020-06-25 DIAGNOSIS — I471 Supraventricular tachycardia: Secondary | ICD-10-CM

## 2020-06-25 LAB — BASIC METABOLIC PANEL
Anion gap: 9 (ref 5–15)
BUN: 15 mg/dL (ref 8–23)
CO2: 26 mmol/L (ref 22–32)
Calcium: 9.9 mg/dL (ref 8.9–10.3)
Chloride: 105 mmol/L (ref 98–111)
Creatinine, Ser: 0.89 mg/dL (ref 0.44–1.00)
GFR, Estimated: >60 mL/min (ref >60)
Glucose, Bld: 105 mg/dL — ABNORMAL HIGH (ref 70–99)
Potassium: 3.6 mmol/L (ref 3.5–5.1)
Sodium: 140 mmol/L (ref 135–145)

## 2020-06-25 LAB — CBC
HCT: 36.6 % (ref 36.0–46.0)
Hemoglobin: 13.3 g/dL (ref 12.0–15.0)
MCH: 30 pg (ref 26.0–34.0)
MCHC: 36.3 g/dL — ABNORMAL HIGH (ref 30.0–36.0)
MCV: 82.6 fL (ref 80.0–100.0)
Platelets: 154 10*3/uL (ref 150–400)
RBC: 4.43 MIL/uL (ref 3.87–5.11)
RDW: 14.5 % (ref 11.5–15.5)
WBC: 5.2 10*3/uL (ref 4.0–10.5)
nRBC: 0 % (ref 0.0–0.2)

## 2020-06-25 LAB — TROPONIN I (HIGH SENSITIVITY): Troponin I (High Sensitivity): 3 ng/L (ref <18)

## 2020-06-25 MED ORDER — FAMOTIDINE 20 MG PO TABS
20.0000 mg | ORAL_TABLET | Freq: Once | ORAL | Status: AC
  Administered 2020-06-25: 20 mg via ORAL
  Filled 2020-06-25: qty 1

## 2020-06-25 MED ORDER — ALUM & MAG HYDROXIDE-SIMETH 200-200-20 MG/5ML PO SUSP
30.0000 mL | Freq: Once | ORAL | Status: AC
  Administered 2020-06-25: 30 mL via ORAL
  Filled 2020-06-25: qty 30

## 2020-06-25 NOTE — Discharge Instructions (Addendum)
It was our pleasure to provide your ER care today - we hope that you feel better.  Make sure to drink plenty of fluids/stay well hydrated. Minimize caffeine use.   Follow up with your doctor/cardiologist in the coming week - call office tomorrow morning to arrange appointment.   Return to ER if worse, new symptoms, fevers, persistent fast heart beating, fainting, chest pain, increased trouble breathing, or other concern.

## 2020-06-25 NOTE — ED Provider Notes (Signed)
[facility] EMERGENCY DEPARTMENT Provider Note   CSN: [id number] Arrival date & time: 06/25/20  1524     History Chief Complaint  Patient presents with  . Tachycardia    [name] is a 70 y.o. female.  Patient c/o feeling as if heart rate has been getting fast several times in the past few days. Notes hx svt 'for years', states contacted her cardiologist and was told to take an extra dose of metoprolol - took extra dose last night. Reports feeling generally weak, tired. Symptoms acute onset, episodic, last minutes to hours, occurs at rest. No relation to activity or exertion. No syncope. No chest pain. States saw gi doctor yesterday for epigastric pain, occasional indigestion. ?hx gerd. States had labs done then. Denies vomiting. No abd distension. No back or flank pain. No fever/chills. States compliant w home meds. States losartan recently added for bp management.   The history is provided by the patient.       Past Medical History:  Diagnosis Date  . Arthritis    Neck  . CIN I (cervical intraepithelial neoplasia I) 2017  . Complication of anesthesia    delayed SVT:  can occur from a few hours to 3 days after the general anesthesia or conscious sedation as happened with her colonoscopy.   [name] Dysplasia of cervix, low grade (CIN 1)    LEEP cervical conization September 2017 margins negative CIN-1  . GERD (gastroesophageal reflux disease)   . Hashimoto's thyroiditis   . History of carpal tunnel syndrome    Left  . History of colon polyps 2010   Benign  . Hyperlipidemia   . Hypothyroidism    Hashimoto's per pt   . Osteopenia 10/2017   T score -1.9 FRAX 8.8% / 0.7%.  Stable from prior DEXA.  [name] SVT (supraventricular tachycardia) ([place])   . Wears glasses     Patient Active Problem List   Diagnosis Date Noted  . 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 Carpal tunnel syndrome of left wrist 03/25/2019  . Abnormal CT of the abdomen 02/25/2018  . Abdominal  pain, right upper quadrant 02/25/2018  . Abnormal finding on radiology exam 02/25/2018  . Allergic rhinitis 02/25/2018  . Brash 02/25/2018  . CN (constipation) 02/25/2018  . LBP (low back pain) 02/25/2018  . Combined form of age-related cataract, both eyes 04/07/2017  . Posterior vitreous detachment of right eye 04/07/2017  . 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 CIN I (cervical intraepithelial neoplasia I) 01/02/2016  . Preoperative cardiovascular examination 11/10/2015  . ASCUS with positive high risk HPV cervical 12/13/2014  . Eyelid retraction 12/02/2014  . Dry eye syndrome of bilateral lacrimal glands 12/01/2014  . 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 Other disorders affecting eyelid function 12/01/2014  . 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 Peripheral visual field defect of both eyes 12/01/2014  . Age-related nuclear cataract of both eyes 09/06/2014  . Anatomical narrow angle of both eyes 09/06/2014  . Hx of Hashimoto thyroiditis 09/06/2014  . Keratoconjunctivitis sicca of both eyes not specified as Sjogren's 09/06/2014  . Meibomian gland dysfunction (MGD) of upper and lower lids of both eyes 09/06/2014  . Abnormal Papanicolaou smear of cervix with positive human papilloma virus (HPV) test 06/27/2014  . Hypokalemia 03/04/2014  . History of paroxysmal supraventricular tachycardia 02/25/2014  . Osteopenia 04/30/2013  . Dysphonia 04/05/2013  . Laryngopharyngeal reflux 04/05/2013  . Hashimoto's thyroiditis 03/29/2013  . 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  Hypercalcemia 05/25/2012  . Hypothyroidism 04/27/2012  . Hyperlipidemia 04/27/2012  . Paroxysmal supraventricular tachycardia ([place]) 04/27/2012  . Tachycardia 01/22/2012  . Endometrial polyp 12/04/2010  . Fibroid, uterus 11/27/2010  . Acquired hypothyroidism 01/17/2010  . Other and unspecified hyperlipidemia 02/01/2008  . Other specified cardiac dysrhythmias(427.89) 12/07/2007     Past Surgical History:  Procedure Laterality Date  . BREAST SURGERY  1976   CYST REMOVED  . CARDIAC ELECTROPHYSIOLOGY STUDY AND ABLATION  2001  . CESAREAN SECTION  1987  . COLONOSCOPY    . COLONOSCOPY WITH PROPOFOL N/A 02/12/2019   Procedure: COLONOSCOPY WITH PROPOFOL;  Surgeon: Wonda Horner, MD;  Location: [facility];  Service: Endoscopy;  Laterality: N/A;  . LEEP  2017  . left thumb surgery and Carpal Tunnel Release  9/14  . lower lid surgery   09/2018   dry eye from Hashimoto's  . POLYPECTOMY  02/12/2019   Procedure: POLYPECTOMY;  Surgeon: Wonda Horner, MD;  Location: [facility];  Service: Endoscopy;;     OB History    Gravida  1   Para  1   Term  1   Preterm      AB      Living  1     SAB      IAB      Ectopic      Multiple      Live Births              Family History  Problem Relation Age of Onset  . Hypertension Mother   . Heart disease Mother   . Mitral valve prolapse Mother   . Glaucoma Mother   . Arthritis Mother   . Von Willebrand disease Mother   . Stroke Mother   . Rheum arthritis Mother   . Neuropathy Mother   . Hypertension Father   . Heart disease Father   . Colon polyps Father   . Glaucoma Father   . Arthritis Father   . Stroke Father   . Rheum arthritis Father   . Pulmonary embolism Father   . Hypertension Brother   . Mitral valve prolapse Brother   . Glaucoma Brother   . Arthritis Brother   . Arthritis Sister     Social History   Tobacco Use  . Smoking status: Never Smoker  . Smokeless tobacco: Never Used  Vaping Use  . Vaping Use: Never used  Substance Use Topics  . Alcohol use: Not Currently  . Drug use: No    Home Medications Prior to Admission medications   Medication Sig Start Date End Date Taking? Authorizing Provider  Alirocumab (PRALUENT) 75 MG/ML SOAJ Inject 1 pen into the skin every 14 (fourteen) days. 11/04/19   Camnitz, Ocie Doyne, MD  aspirin EC 81 MG tablet Take 81 mg by mouth daily.  Swallow whole.    [provider]  Cholecalciferol (VITAMIN D-3) 5000 units TABS Take 5,000 mg by mouth See admin instructions. Monday and Friday    [provider]  fluticasone (FLONASE) 50 MCG/ACT nasal spray Place 1 spray into both nostrils as needed for allergies.  02/10/18   [provider]  levothyroxine (SYNTHROID) 88 MCG tablet Take 88 mcg by mouth daily. 03/15/20   [provider]  losartan (COZAAR) 25 MG tablet Take 1 tablet (25 mg total) by mouth daily. 06/19/20 09/17/20  Freada Bergeron, MD  metoprolol succinate (TOPROL-XL) 50 MG 24 hr tablet Take 1 tablet (50 mg total) by mouth daily. Take  with or immediately following a meal. Please make yearly appt with Dr. Curt Bears for April 2022 before anymore refills. Thank you 1st attempt 06/20/20   Constance Haw, MD  Multiple Vitamin (MULTIVITAMIN WITH MINERALS) TABS tablet Take 1 tablet by mouth daily.    [provider]  polyethylene glycol (MIRALAX / GLYCOLAX) packet Take 17 g by mouth daily as needed for mild constipation.     [provider]  Probiotic Product (PROBIOTIC ADVANCED PO) Take 1 capsule by mouth daily.     [provider]    Allergies    Lidocaine, Lipitor [atorvastatin], Repatha [evolocumab], Diphenhydramine, and Epinephrine  Review of Systems   Review of Systems  Constitutional: Negative for chills and fever.  HENT: Negative for sore throat.   Eyes: Negative for redness.  Respiratory: Negative for cough and shortness of breath.   Cardiovascular: Negative for chest pain.  Gastrointestinal: Negative for abdominal pain and vomiting.  Genitourinary: Negative for flank pain.  Musculoskeletal: Negative for back pain and neck pain.  Skin: Negative for rash.  Neurological: Negative for headaches.  Hematological: Does not bruise/bleed easily.  Psychiatric/Behavioral: Negative for confusion.    Physical Exam Updated Vital Signs BP (!) 152/67 (BP  Location: Left Arm)   Pulse 61   Temp 98.9 F (37.2 C)   Resp 18   LMP 11/26/2000   SpO2 100%   Physical Exam Vitals and nursing note reviewed.  Constitutional:      Appearance: Normal appearance. She is well-developed.  HENT:     Head: Atraumatic.     Nose: Nose normal.     Mouth/Throat:     Mouth: Mucous membranes are moist.  Eyes:     General: No scleral icterus.    Conjunctiva/sclera: Conjunctivae normal.  Neck:     Trachea: No tracheal deviation.     Comments: Thyroid not grossly enlarged or tender.  Cardiovascular:     Rate and Rhythm: Normal rate and regular rhythm.     Pulses: Normal pulses.     Heart sounds: Normal heart sounds. No murmur heard. No friction rub. No gallop.   Pulmonary:     Effort: Pulmonary effort is normal. No respiratory distress.     Breath sounds: Normal breath sounds.  Abdominal:     General: Bowel sounds are normal. There is no distension.     Palpations: Abdomen is soft.     Tenderness: There is no abdominal tenderness. There is no guarding.  Genitourinary:    Comments: No cva tenderness.  Musculoskeletal:        General: No swelling or tenderness.     Cervical back: Normal range of motion and neck supple. No rigidity. No muscular tenderness.     Right lower leg: No edema.     Left lower leg: No edema.  Skin:    General: Skin is warm and dry.     Findings: No rash.  Neurological:     Mental Status: She is alert.     Comments: Alert, speech normal.   Psychiatric:        Mood and Affect: Mood normal.     ED Results / Procedures / Treatments   Labs (all labs ordered are listed, but only abnormal results are displayed) Results for orders placed or performed during the hospital encounter of 22/02/54  Basic metabolic panel  Result Value Ref Range   Sodium 140 135 - 145 mmol/L   Potassium 3.6 3.5 - 5.1 mmol/L   Chloride 105 98 -  111 mmol/L   CO2 26 22 - 32 mmol/L   Glucose, Bld 105 (H) 70 - 99 mg/dL   BUN 15 8 - 23 mg/dL    Creatinine, Ser 0.89 0.44 - 1.00 mg/dL   Calcium 9.9 8.9 - 10.3 mg/dL   GFR, Estimated >60 >60 mL/min   Anion gap 9 5 - 15  CBC  Result Value Ref Range   WBC 5.2 4.0 - 10.5 K/uL   RBC 4.43 3.87 - 5.11 MIL/uL   Hemoglobin 13.3 12.0 - 15.0 g/dL   HCT 36.6 36.0 - 46.0 %   MCV 82.6 80.0 - 100.0 fL   MCH 30.0 26.0 - 34.0 pg   MCHC 36.3 (H) 30.0 - 36.0 g/dL   RDW 14.5 11.5 - 15.5 %   Platelets 154 150 - 400 K/uL   nRBC 0.0 0.0 - 0.2 %  Troponin I (High Sensitivity)  Result Value Ref Range   Troponin I (High Sensitivity) 3 <18 ng/L   EKG EKG Interpretation  Date/Time:  Sunday June 25 2020 15:28:00 EST Ventricular Rate:  59 PR Interval:  162 QRS Duration: 78 QT Interval:  402 QTC Calculation: 397 R Axis:   27 Text Interpretation: Sinus bradycardia Confirmed by Lajean Saver [phone]) on 06/25/2020 3:43:55 PM   Radiology No results found.  Procedures Procedures   Medications Ordered in ED Medications  famotidine (PEPCID) tablet 20 mg (has no administration in time range)  alum & mag hydroxide-simeth (MAALOX/MYLANTA) 200-200-20 MG/5ML suspension 30 mL (has no administration in time range)    ED Course  I have reviewed the triage vital signs and the nursing notes.  Pertinent labs & imaging results that were available during my care of the patient were reviewed by me and considered in my medical decision making (see chart for details).    MDM Rules/Calculators/A&P                         Iv ns. Continuous pulse ox and cardiac monitoring - sinus rhythm. Stat labs.   pepcid po, maalox po.    Reviewed nursing notes and prior charts for additional history. Labs reviewed from visit to her doctor yesterday, largely normal, ca mildly elev.   Labs reviewed/interpreted by me - after symptoms for past couple days, trop normal - felt not c/w ACS. Pt remains in nsr.   Recheck pt, remains in nsr, no dysrhythmia noted on monitor.   Pt currently appears stable for d/c.   Rec f/u  with her pcp/cardiology this week.  Return precautions provided.      Final Clinical Impression(s) / ED Diagnoses Final diagnoses:  None    Rx / DC Orders ED Discharge Orders    None       Lajean Saver, MD 06/25/20 1800

## 2020-06-25 NOTE — ED Triage Notes (Signed)
Pt reports "tachycardia and SVT" over the last 2 days.  States she has been in touch with her cardiologist and took extra dose of Metoprolol the last 2 nights.  Reports HR 52-140.  C/o epigastric pain, tightness in throat, dizziness, and feels like she is going to pass out.

## 2020-06-25 NOTE — Telephone Encounter (Signed)
Ms. Dangler called back again stating her BP is 150/92 and HR range 88-120bpm, she feels slightly lightheaded. She has gone to Urgent care in the AM they checked her CBC and BMP and noted her Ca was high. TSH was not checked. I advised patient to take another dose of the Metoprolol. If her symptoms persist, I advised her to come to the ED for assessment since she has been dealing with these symptoms for a few days now without much improvement. Patient endorses understanding.   Signed: Hessie Knows 06/25/2020, 1:39 AM

## 2020-06-26 ENCOUNTER — Telehealth: Payer: Self-pay | Admitting: Cardiology

## 2020-06-26 NOTE — Telephone Encounter (Signed)
RE: needs follow-up with Camnitz or EP APP at very next available per Dr. Curt Bears Received: Today Oletta Lamas, [place] Joellyn Haff, LPN; Stanton Kidney, RN Appt with Chanetta Marshall 03.11.22 1120am    Pt has an appt with Chanetta Marshall NP in EP for 06/30/20 at 1120. Pt made aware of appt date and time by EP Scheduler.

## 2020-06-26 NOTE — Telephone Encounter (Signed)
Needs follow up in EP clinic.

## 2020-06-26 NOTE — Telephone Encounter (Signed)
Pt c/o BP issue: STAT if pt c/o blurred vision, one-sided weakness or slurred speech  1. What are your last 5 BP readings?  108/79 HR 74 11  2. Are you having any other symptoms (ex. Dizziness, headache, blurred vision, passed out)? Fatigue but not as bad as yesterday when seen and still having stomach issues   3. What is your BP issue? Was advised to stop metoprolol succinate (TOPROL-XL) 50 MG 24 hr tablet last night and losartan (COZAAR) 25 MG tablet this morning by DOD on call last night. Still having BP issues. Pt is requesting to be worked in for an appt today. Please advise.

## 2020-06-26 NOTE — Telephone Encounter (Signed)
Returned call to patient who states she was having episodes of tachycardia on Friday and Saturday and spoke with our on-call staff. She was advised to take additional metoprolol so she had total of 100 mg each day.  Today, BP is 112/79 mmHg and HR 74. She feels weak and nauseated but feels better than she did over the weekend. Also was seen at Urgent Care on 3/5 for gastritis. Seen in ED yesterday 3/6 at [facility] for weakness and sent home after IV hydration, labs, and stable vitals.  She has not taken losartan yesterday or today due to lower BPs. I advised her to continue to hold. She would like to know what dose of metoprolol to take today because she know she may have rebound tachycardia if she does not take any. She has follow-up this week for Hashimoto's to see if there has been a change in thyroid levels that could be causing increase in HR. I advised that I will route message to Drs. Camnitz and Pemberton for advice on metoprolol dosing. Advised her to go ahead and take metoprolol 25 mg now. She verbalized understanding and agreement and thanked me for the call.

## 2020-06-27 NOTE — Telephone Encounter (Signed)
Pt called back and stated that she would like to know what to do with her BP med in the the mean time until she see EP on 3/11?  She is requesting to have a call back today   Best number [phone]

## 2020-06-27 NOTE — Telephone Encounter (Signed)
Returned pt call. Pt concerned because she is not sure what to do about her medications between now and Friday's appt.  She was under the impression the triage nurse would call her back yesterday on this matter. Reviewed w/ Dr. Johney Frame.  Pt advised to hold Losartan and not restart at this time.  Advised to take Toprol until further discussion at appt end of week.  Advised to monitor BP and not take if SBP less than 130s. Advised to call office if HR issues arise before appt Friday. Patient verbalized understanding and agreeable to plan.

## 2020-06-28 NOTE — Telephone Encounter (Signed)
Addressed.  See telephone note for further detail on 06/26/20.

## 2020-06-30 ENCOUNTER — Ambulatory Visit: Payer: Medicare Other | Admitting: Nurse Practitioner

## 2020-06-30 ENCOUNTER — Other Ambulatory Visit: Payer: Self-pay

## 2020-06-30 ENCOUNTER — Encounter: Payer: Self-pay | Admitting: Nurse Practitioner

## 2020-06-30 VITALS — BP 120/84 | HR 68 | Ht 63.0 in | Wt 175.8 lb

## 2020-06-30 DIAGNOSIS — I471 Supraventricular tachycardia: Secondary | ICD-10-CM

## 2020-06-30 NOTE — Progress Notes (Signed)
Electrophysiology Office Note Date: 06/30/2020  ID:  [name], DOB April 02, 1951, MRN [id number]  PCP: Cari Caraway, MD Primary Cardiologist: Johney Frame Electrophysiologist: Curt Bears  CC: SVT  [name] is a 70 y.o. female seen today for Dr Curt Bears.  She has a history of SVT and is s/p ablation at [facility] in 2001.  She has done well since with only rare recurrence of palpitations. This weekend, she had 2 episodes of SVT that were abrupt in onset and terminated spontaneously. She took an extra Metoprolol each time.  She has been seen by endocrinologist who checked TSH that was abnormal - she is awaiting a call back for medication adjustments.     Past Medical History:  Diagnosis Date  . Arthritis    Neck  . CIN I (cervical intraepithelial neoplasia I) 2017  . Complication of anesthesia    delayed SVT:  can occur from a few hours to 3 days after the general anesthesia or conscious sedation as happened with her colonoscopy.   [name] Dysplasia of cervix, low grade (CIN 1)    LEEP cervical conization September 2017 margins negative CIN-1  . GERD (gastroesophageal reflux disease)   . Hashimoto's thyroiditis   . History of carpal tunnel syndrome    Left  . History of colon polyps 2010   Benign  . Hyperlipidemia   . Hypothyroidism    Hashimoto's per pt   . Osteopenia 10/2017   T score -1.9 FRAX 8.8% / 0.7%.  Stable from prior DEXA.  [name] SVT (supraventricular tachycardia) ([place])   . Wears glasses    Past Surgical History:  Procedure Laterality Date  . BREAST SURGERY  1976   CYST REMOVED  . CARDIAC ELECTROPHYSIOLOGY STUDY AND ABLATION  2001  . CESAREAN SECTION  1987  . COLONOSCOPY    . COLONOSCOPY WITH PROPOFOL N/A 02/12/2019   Procedure: COLONOSCOPY WITH PROPOFOL;  Surgeon: Wonda Horner, MD;  Location: [facility];  Service: Endoscopy;  Laterality: N/A;  . LEEP  2017  . left thumb surgery and Carpal Tunnel Release  9/14  . lower lid surgery   09/2018   dry  eye from Hashimoto's  . POLYPECTOMY  02/12/2019   Procedure: POLYPECTOMY;  Surgeon: Wonda Horner, MD;  Location: [facility];  Service: Endoscopy;;    Current Outpatient Medications  Medication Sig Dispense Refill  . Alirocumab (PRALUENT) 75 MG/ML SOAJ Inject 1 pen into the skin every 14 (fourteen) days. 2 mL 11  . aspirin EC 81 MG tablet Take 81 mg by mouth daily. Swallow whole.    . Cholecalciferol (VITAMIN D-3) 5000 units TABS Take 5,000 mg by mouth See admin instructions. Monday and Friday    . fluticasone (FLONASE) 50 MCG/ACT nasal spray Place 1 spray into both nostrils as needed for allergies.     [name] levothyroxine (SYNTHROID) 88 MCG tablet Take 88 mcg by mouth daily.    [name] losartan (COZAAR) 25 MG tablet Take 1 tablet (25 mg total) by mouth daily. 90 tablet 3  . metoprolol succinate (TOPROL-XL) 50 MG 24 hr tablet Take 1 tablet (50 mg total) by mouth daily. Take with or immediately following a meal. Please make yearly appt with Dr. Curt Bears for April 2022 before anymore refills. Thank you 1st attempt 90 tablet 0  . Multiple Vitamin (MULTIVITAMIN WITH MINERALS) TABS tablet Take 1 tablet by mouth daily.    . polyethylene glycol (MIRALAX / GLYCOLAX) packet Take 17 g by mouth daily as needed for mild constipation.     [name]  Probiotic Product (PROBIOTIC ADVANCED PO) Take 1 capsule by mouth daily.      No current facility-administered medications for this visit.    Allergies:   Lipitor [atorvastatin], Repatha [evolocumab], Diphenhydramine, and Epinephrine   Social History: Social History   Socioeconomic History  . Marital status: Widowed    Spouse name: Not on file  . Number of children: 1  . Years of education: Not on file  . Highest education level: Master's degree (e.g., MA, MS, MEng, MEd, MSW, MBA)  Occupational History  . Not on file  Tobacco Use  . Smoking status: Never Smoker  . Smokeless tobacco: Never Used  Vaping Use  . Vaping Use: Never used  Substance and Sexual Activity   . Alcohol use: Not Currently  . Drug use: No  . Sexual activity: Not Currently    Partners: Male    Birth control/protection: Post-menopausal    Comment: 70 YEARS OLD, NO MORE THAN 5 PARTNERS ,des neg  Other Topics Concern  . Not on file  Social History Narrative   Her daughter lives at home with her    Right handed   Caffeine: coffee, soda, (about 1 cup/day)   Social Determinants of Health   Financial Resource Strain: Not on file  Food Insecurity: Not on file  Transportation Needs: Not on file  Physical Activity: Not on file  Stress: Not on file  Social Connections: Not on file  Intimate Partner Violence: Not on file    Family History: Family History  Problem Relation Age of Onset  . Hypertension Mother   . Heart disease Mother   . Mitral valve prolapse Mother   . Glaucoma Mother   . Arthritis Mother   . Von Willebrand disease Mother   . Stroke Mother   . Rheum arthritis Mother   . Neuropathy Mother   . Hypertension Father   . Heart disease Father   . Colon polyps Father   . Glaucoma Father   . Arthritis Father   . Stroke Father   . Rheum arthritis Father   . Pulmonary embolism Father   . Hypertension Brother   . Mitral valve prolapse Brother   . Glaucoma Brother   . Arthritis Brother   . Arthritis Sister     Review of Systems: All other systems reviewed and are otherwise negative except as noted above.   Physical Exam: VS:  BP 120/84   Pulse 68   Ht 5\' 3"  (1.6 m)   Wt 175 lb 12.8 oz (79.7 kg)   LMP 11/26/2000   SpO2 95%   BMI 31.14 kg/m  , BMI Body mass index is 31.14 kg/m. Wt Readings from Last 3 Encounters:  06/30/20 175 lb 12.8 oz (79.7 kg)  06/19/20 175 lb 6.4 oz (79.6 kg)  01/04/20 174 lb 6.4 oz (79.1 kg)    GEN- The patient is well appearing, alert and oriented x 3 today.   HEENT: normocephalic, atraumatic; sclera clear, conjunctiva pink; hearing intact; oropharynx clear; neck supple, no JVP Lymph- no cervical lymphadenopathy Lungs-  Clear to ausculation bilaterally, normal work of breathing.  No wheezes, rales, rhonchi Heart- Regular rate and rhythm, no murmurs, rubs or gallops, PMI not laterally displaced GI- soft, non-tender, non-distended, bowel sounds present, no hepatosplenomegaly Extremities- no clubbing, cyanosis, or edema; DP/PT/radial pulses 2+ bilaterally MS- no significant deformity or atrophy Skin- warm and dry, no rash or lesion  Psych- euthymic mood, full affect Neuro- strength and sensation are intact   EKG:  EKG  is not ordered today.  Recent Labs: 10/14/2019: ALT 23 06/25/2020: BUN 15; Creatinine, Ser 0.89; Hemoglobin 13.3; Platelets 154; Potassium 3.6; Sodium 140    Other studies Reviewed: Additional studies/ records that were reviewed today include: Dr Curt Bears and Dr Jacolyn Reedy office notes   Assessment and Plan:  1. SVT  Clinical recurrence over the weekend in the setting of thyroid abnormalities We reviewed treatment options today. After discussion, plan is to await call back for thyroid adjustments. If recurrence after that, she is considering proceeding with EPS/ablation.  Risks, benefits reviewed with patient today. Also discussed with Dr Curt Bears today Asked her to stay off Losartan for now with normal BP and potentially needing increased Metoprolol dose.    Current medicines are reviewed at length with the patient today.   The patient does not have concerns regarding her medicines.  The following changes were made today:  none  Labs/ tests ordered today include: none No orders of the defined types were placed in this encounter.    Disposition:   Follow up with Dr Curt Bears 6 months      Signed, Chanetta Marshall, NP 06/30/2020 11:52 AM   [facility] [address] [place] [address] [phone] (office) [phone] (fax)

## 2020-06-30 NOTE — Patient Instructions (Signed)
Medication Instructions:  Your physician recommends that you continue on your current medications as directed. Please refer to the Current Medication list given to you today.  *If you need a refill on your cardiac medications before your next appointment, please call your pharmacy*   Lab Work: None  If you have labs (blood work) drawn today and your tests are completely normal, you will receive your results only by: [name] MyChart Message (if you have MyChart) OR . A paper copy in the mail If you have any lab test that is abnormal or we need to change your treatment, we will call you to review the results.   Follow-Up: At [facility], you and your health needs are our priority.  As part of our continuing mission to provide you with exceptional heart care, we have created designated Provider Care Teams.  These Care Teams include your primary Cardiologist (physician) and Advanced Practice Providers (APPs -  Physician Assistants and Nurse Practitioners) who all work together to provide you with the care you need, when you need it.  Your next appointment:   6 month(s)  The format for your next appointment:   In Person  Provider:   You may see Will Meredith Leeds, MD or one of the following Advanced Practice Providers on your designated Care Team:    Chanetta Marshall, NP  Tommye Standard, PA-C  Legrand Como [facility] [place], [place]

## 2020-07-11 ENCOUNTER — Other Ambulatory Visit: Payer: Self-pay

## 2020-07-11 DIAGNOSIS — N879 Dysplasia of cervix uteri, unspecified: Secondary | ICD-10-CM

## 2020-07-11 DIAGNOSIS — R87610 Atypical squamous cells of undetermined significance on cytologic smear of cervix (ASC-US): Secondary | ICD-10-CM

## 2020-08-01 ENCOUNTER — Other Ambulatory Visit: Payer: Self-pay

## 2020-08-01 ENCOUNTER — Ambulatory Visit: Payer: Medicare Other | Admitting: Obstetrics & Gynecology

## 2020-08-01 ENCOUNTER — Other Ambulatory Visit (HOSPITAL_COMMUNITY)
Admission: RE | Admit: 2020-08-01 | Discharge: 2020-08-01 | Disposition: A | Discharge status: Home / Self Care | Payer: Medicare Other | Source: Ambulatory Visit | Attending: Obstetrics & Gynecology | Admitting: Obstetrics & Gynecology

## 2020-08-01 ENCOUNTER — Encounter: Payer: Self-pay | Admitting: Obstetrics & Gynecology

## 2020-08-01 DIAGNOSIS — Z01411 Encounter for gynecological examination (general) (routine) with abnormal findings: Secondary | ICD-10-CM | POA: Diagnosis not present

## 2020-08-01 DIAGNOSIS — Z1151 Encounter for screening for human papillomavirus (HPV): Secondary | ICD-10-CM | POA: Diagnosis not present

## 2020-08-01 DIAGNOSIS — Z124 Encounter for screening for malignant neoplasm of cervix: Secondary | ICD-10-CM | POA: Insufficient documentation

## 2020-08-01 DIAGNOSIS — R87810 Cervical high risk human papillomavirus (HPV) DNA test positive: Secondary | ICD-10-CM

## 2020-08-01 NOTE — Addendum Note (Signed)
Addended by: Thurnell Garbe A on: 08/01/2020 01:49 PM   Modules accepted: Orders

## 2020-08-01 NOTE — Addendum Note (Signed)
Addended by: Princess Bruins on: 08/01/2020 02:39 PM   Modules accepted: Orders

## 2020-08-01 NOTE — Progress Notes (Signed)
    [name] [birth year] [id number]        69 y.o.  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   OB History  Gravida Para Term Preterm AB Living  1 1 1     1   SAB IAB Ectopic Multiple Live Births               # Outcome Date GA Lbr Len/2nd Weight Sex Delivery Anes PTL Lv  1 Term             Past medical history,surgical history, problem list, medications, allergies, family history and social history were all reviewed and documented in the EPIC chart.   Directed ROS with pertinent positives and negatives documented in the history of present illness/assessment and plan.  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  Speculum placed in vagina and excellent visualization of cervix achieved, cervix swabbed x 3 with acetic acid solution.  Findings:  Cervix colposcopy: Physical Exam Genitourinary:       Vaginal colposcopy: Normal  Vulvar colposcopy: Normal  Perirectal colposcopy: Normal  The cervix was sprayed with Hurricane before performing the cervical biopsies.  Specimens:  HPV 16-18-45.  Cervical Bx at 4 O'Clock  Complications:  None, hemostasis with Silver Nitrate . Plan:  Management per cervical Bx and HPV 16-18-45 results.   Assessment/Plan:  70 y.o. G1P1001   1. 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  Colposcopy October 2021 showed koilocytotic atypia.  Repeat high risk HPV was then negative.  Colposcopy done today.  Colposcopy findings reviewed with patient.  HPV 16-18-45 repeated.  Cervical biopsy  done at 4:00.  Management per results.  Postprocedure precautions reviewed. - Colposcopy  Princess Bruins MD, 12:34 PM 08/01/2020

## 2020-08-02 ENCOUNTER — Emergency Department
Admission: EM | Admit: 2020-08-02 | Discharge: 2020-08-02 | Disposition: A | Discharge status: Home / Self Care | Payer: Medicare Other | Attending: Emergency Medicine | Admitting: Emergency Medicine

## 2020-08-02 ENCOUNTER — Other Ambulatory Visit: Payer: Self-pay

## 2020-08-02 ENCOUNTER — Encounter: Payer: Self-pay | Admitting: Emergency Medicine

## 2020-08-02 DIAGNOSIS — R42 Dizziness and giddiness: Secondary | ICD-10-CM

## 2020-08-02 DIAGNOSIS — Z79899 Other long term (current) drug therapy: Secondary | ICD-10-CM | POA: Diagnosis not present

## 2020-08-02 DIAGNOSIS — E039 Hypothyroidism, unspecified: Secondary | ICD-10-CM | POA: Diagnosis not present

## 2020-08-02 LAB — CBC
HCT: 38.1 % (ref 36.0–46.0)
Hemoglobin: 13.6 g/dL (ref 12.0–15.0)
MCH: 28.9 pg (ref 26.0–34.0)
MCHC: 35.7 g/dL (ref 30.0–36.0)
MCV: 81.1 fL (ref 80.0–100.0)
Platelets: 164 10*3/uL (ref 150–400)
RBC: 4.7 MIL/uL (ref 3.87–5.11)
RDW: 14.2 % (ref 11.5–15.5)
WBC: 7.7 10*3/uL (ref 4.0–10.5)
nRBC: 0 % (ref 0.0–0.2)

## 2020-08-02 LAB — BASIC METABOLIC PANEL
Anion gap: 8 (ref 5–15)
BUN: 18 mg/dL (ref 8–23)
CO2: 25 mmol/L (ref 22–32)
Calcium: 10 mg/dL (ref 8.9–10.3)
Chloride: 106 mmol/L (ref 98–111)
Creatinine, Ser: 0.94 mg/dL (ref 0.44–1.00)
GFR, Estimated: >60 mL/min (ref >60)
Glucose, Bld: 100 mg/dL — ABNORMAL HIGH (ref 70–99)
Potassium: 3.9 mmol/L (ref 3.5–5.1)
Sodium: 139 mmol/L (ref 135–145)

## 2020-08-02 MED ORDER — SODIUM CHLORIDE 0.9 % IV SOLN
1000.0000 mL | Freq: Once | INTRAVENOUS | Status: AC
  Administered 2020-08-02: 1000 mL via INTRAVENOUS

## 2020-08-02 MED ORDER — KETOROLAC TROMETHAMINE 30 MG/ML IJ SOLN
15.0000 mg | Freq: Once | INTRAMUSCULAR | Status: AC
  Administered 2020-08-02: 15 mg via INTRAVENOUS
  Filled 2020-08-02: qty 1

## 2020-08-02 NOTE — ED Triage Notes (Signed)
Pt comes into the ED via EMS from the side of the road states she had some dental work, states they used a high dose of lidocaine, states she pulled off to the side of the road due to having a lot of dizziness and did not feel safe driving  [id number] 354/65 99%RA

## 2020-08-02 NOTE — ED Provider Notes (Signed)
[facility] Emergency Department Provider Note   ____________________________________________    I have reviewed the triage vital signs and the nursing notes.   HISTORY  Chief Complaint Dizziness     HPI [name] is a 70 y.o. female who reports that she had a tooth pulled today, she reports a difficulty achieving anesthesia so they had to use a lot of lidocaine.  As she was driving home after the procedure she felt lightheaded and dizzy.  She was concerned that she may pass out.  She also describes a global headache.  She came to the ED because of this.  No neuro deficits.  No nausea or vomiting.  No chest pain or palpitations.   Past Medical History:  Diagnosis Date  . Arthritis    Neck  . CIN I (cervical intraepithelial neoplasia I) 2017  . Complication of anesthesia    delayed SVT:  can occur from a few hours to 3 days after the general anesthesia or conscious sedation as happened with her colonoscopy.   [name] Dysplasia of cervix, low grade (CIN 1)    LEEP cervical conization September 2017 margins negative CIN-1  . GERD (gastroesophageal reflux disease)   . Hashimoto's thyroiditis   . History of carpal tunnel syndrome    Left  . History of colon polyps 2010   Benign  . Hyperlipidemia   . Hypothyroidism    Hashimoto's per pt   . Osteopenia 10/2017   T score -1.9 FRAX 8.8% / 0.7%.  Stable from prior DEXA.  [name] SVT (supraventricular tachycardia) ([place])   . Wears glasses     Patient Active Problem List   Diagnosis Date Noted  . 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 Carpal tunnel syndrome of left wrist 03/25/2019  . Abnormal CT of the abdomen 02/25/2018  . Abdominal pain, right upper quadrant 02/25/2018  . Abnormal finding on radiology exam 02/25/2018  . Allergic rhinitis 02/25/2018  . Brash 02/25/2018  . CN (constipation) 02/25/2018  . LBP (low back pain) 02/25/2018  . Combined form of age-related cataract, both eyes 04/07/2017  .  Posterior vitreous detachment of right eye 04/07/2017  . 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 CIN I (cervical intraepithelial neoplasia I) 01/02/2016  . Preoperative cardiovascular examination 11/10/2015  . ASCUS with positive high risk HPV cervical 12/13/2014  . Eyelid retraction 12/02/2014  . Dry eye syndrome of bilateral lacrimal glands 12/01/2014  . 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 Other disorders affecting eyelid function 12/01/2014  . 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 Peripheral visual field defect of both eyes 12/01/2014  . Age-related nuclear cataract of both eyes 09/06/2014  . Anatomical narrow angle of both eyes 09/06/2014  . Hx of Hashimoto thyroiditis 09/06/2014  . Keratoconjunctivitis sicca of both eyes not specified as Sjogren's 09/06/2014  . Meibomian gland dysfunction (MGD) of upper and lower lids of both eyes 09/06/2014  . Abnormal Papanicolaou smear of cervix with positive human papilloma virus (HPV) test 06/27/2014  . Hypokalemia 03/04/2014  . History of paroxysmal supraventricular tachycardia 02/25/2014  . Osteopenia 04/30/2013  . Dysphonia 04/05/2013  . Laryngopharyngeal reflux 04/05/2013  . Hashimoto's thyroiditis 03/29/2013  . 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 Hypercalcemia 05/25/2012  . Hypothyroidism 04/27/2012  . Hyperlipidemia 04/27/2012  . Paroxysmal supraventricular tachycardia ([place]) 04/27/2012  . Tachycardia 01/22/2012  . Endometrial polyp 12/04/2010  . Fibroid, uterus 11/27/2010  . Acquired hypothyroidism 01/17/2010  . Other and unspecified hyperlipidemia 02/01/2008  .  Other specified cardiac dysrhythmias(427.89) 12/07/2007    Past Surgical History:  Procedure Laterality Date  . BREAST SURGERY  1976   CYST REMOVED  . CARDIAC ELECTROPHYSIOLOGY STUDY AND ABLATION  2001  . CESAREAN SECTION  1987  . COLONOSCOPY    . COLONOSCOPY WITH PROPOFOL N/A 02/12/2019   Procedure: COLONOSCOPY WITH  PROPOFOL;  Surgeon: Wonda Horner, MD;  Location: [facility] ENDOSCOPY;  Service: Endoscopy;  Laterality: N/A;  . LEEP  2017  . left thumb surgery and Carpal Tunnel Release  9/14  . lower lid surgery   09/2018   dry eye from Hashimoto's  . POLYPECTOMY  02/12/2019   Procedure: POLYPECTOMY;  Surgeon: Wonda Horner, MD;  Location: [facility];  Service: Endoscopy;;    Prior to Admission medications   Medication Sig Start Date End Date Taking? Authorizing Provider  Alirocumab (PRALUENT) 75 MG/ML SOAJ Inject 1 pen into the skin every 14 (fourteen) days. 11/04/19   Camnitz, Ocie Doyne, MD  aspirin EC 81 MG tablet Take 81 mg by mouth daily. Swallow whole.    [provider]  Cholecalciferol (VITAMIN D-3) 5000 units TABS Take 5,000 mg by mouth See admin instructions. Monday and Friday    [provider]  fluticasone (FLONASE) 50 MCG/ACT nasal spray Place 1 spray into both nostrils as needed for allergies.  02/10/18   [provider]  levothyroxine (SYNTHROID) 88 MCG tablet Take 88 mcg by mouth daily. 03/15/20   [provider]  metoprolol succinate (TOPROL-XL) 50 MG 24 hr tablet Take 1 tablet (50 mg total) by mouth daily. Take with or immediately following a meal. Please make yearly appt with Dr. Curt Bears for April 2022 before anymore refills. Thank you 1st attempt 06/20/20   Constance Haw, MD  Multiple Vitamin (MULTIVITAMIN WITH MINERALS) TABS tablet Take 1 tablet by mouth daily.    [provider]  polyethylene glycol (MIRALAX / GLYCOLAX) packet Take 17 g by mouth daily as needed for mild constipation.     [provider]  Probiotic Product (PROBIOTIC ADVANCED PO) Take 1 capsule by mouth daily.     [provider]     Allergies Lipitor [atorvastatin], Repatha [evolocumab], Diphenhydramine, and Epinephrine  Family History  Problem Relation Age of Onset  . Hypertension Mother   . Heart disease Mother   . Mitral valve prolapse Mother    . Glaucoma Mother   . Arthritis Mother   . Von Willebrand disease Mother   . Stroke Mother   . Rheum arthritis Mother   . Neuropathy Mother   . Hypertension Father   . Heart disease Father   . Colon polyps Father   . Glaucoma Father   . Arthritis Father   . Stroke Father   . Rheum arthritis Father   . Pulmonary embolism Father   . Hypertension Brother   . Mitral valve prolapse Brother   . Glaucoma Brother   . Arthritis Brother   . Arthritis Sister     Social History Social History   Tobacco Use  . Smoking status: Never Smoker  . Smokeless tobacco: Never Used  Vaping Use  . Vaping Use: Never used  Substance Use Topics  . Alcohol use: Not Currently  . Drug use: No    Review of Systems  Constitutional: No fever/chills Eyes: No visual changes.  ENT: No sore throat. Cardiovascular: As above Respiratory: Denies shortness of breath. Gastrointestinal: No abdominal pain.  No nausea, no vomiting.   Genitourinary: Negative for dysuria.  Musculoskeletal: Negative for back pain. Skin: Negative for rash. Neurological: Negative for headaches or weakness   ____________________________________________   PHYSICAL EXAM:  VITAL SIGNS: ED Triage Vitals [08/02/20 1349]  Enc Vitals Group     BP (!) 151/94     Pulse Rate 65     Resp 18     Temp 97.6 F (36.4 C)     Temp Source Oral     SpO2 100 %     Weight 78 kg (172 lb)     Height 1.6 m (5\' 3" )     Head Circumference      Peak Flow      Pain Score 5     Pain Loc      Pain Edu?      Excl. in [place]?     Constitutional: Alert and oriented. No acute distress. Pleasant and interactive Eyes: Conjunctivae are normal.  PERRLA  Nose: No congestion/rhinnorhea. Mouth/Throat: Mucous membranes are moist.    Cardiovascular: Normal rate, regular rhythm. Grossly normal heart sounds.  Good peripheral circulation. Respiratory: Normal respiratory effort.  No retractions.   Musculoskeletal: No lower extremity tenderness nor  edema.  Warm and well perfused Neurologic:  Normal speech and language. No gross focal neurologic deficits are appreciated.  Skin:  Skin is warm, dry and intact. No rash noted. Psychiatric: Mood and affect are normal. Speech and behavior are normal.  ____________________________________________   LABS (all labs ordered are listed, but only abnormal results are displayed)  Labs Reviewed  BASIC METABOLIC PANEL - Abnormal; Notable for the following components:      Result Value   Glucose, Bld 100 (*)    All other components within normal limits  CBC   ____________________________________________  EKG  ED ECG REPORT I, Lavonia Drafts, the attending physician, personally viewed and interpreted this ECG.  Date: 08/02/2020  Rhythm: normal sinus rhythm QRS Axis: normal Intervals: normal ST/T Wave abnormalities: normal Narrative Interpretation: no evidence of acute ischemia  ____________________________________________  RADIOLOGY  None ____________________________________________   PROCEDURES  Procedure(s) performed: No  Procedures   Critical Care performed: No ____________________________________________   INITIAL IMPRESSION / ASSESSMENT AND PLAN / ED COURSE  Pertinent labs & imaging results that were available during my care of the patient were reviewed by me and considered in my medical decision making (see chart for details).  Patient presents with lightheaded feeling after dental work.  Mild headache here as well.  EKG is normal and quite reassuring.  No palpitations or chest pain.  Lab work demonstrates normal white blood cell count, normal BUN and creatinine.  Patient treated with IV fluids and IV Toradol with complete resolution of headache  After IV fluids no further dizziness, patient feels comfortable with discharge, return precautions discussed    ____________________________________________   FINAL CLINICAL IMPRESSION(S) / ED DIAGNOSES  Final  diagnoses:  Dizziness        Note:  This document was prepared using Dragon voice recognition software and may include unintentional dictation errors.   Lavonia Drafts, MD 08/02/20 2216

## 2020-08-02 NOTE — ED Notes (Signed)
Patient lights dimmed and cold compress placed on forehead.

## 2020-08-03 LAB — SURGICAL PATHOLOGY

## 2020-08-14 ENCOUNTER — Telehealth: Payer: Self-pay

## 2020-08-14 NOTE — Telephone Encounter (Signed)
-----   Message from Princess Bruins, MD sent at 08/10/2020  6:13 AM EDT ----- CIN 1.  Pap Negative.  Repeat Pap in 1 year.

## 2020-08-14 NOTE — Telephone Encounter (Addendum)
Patient inquired about result of HPV 16, 18, 45.  This was not performed due to some confusion with reflex orders (I'll ask Glorianne Manchester, RN to straighten out with them).  Patient would like to know status since HPV 16+ previously.  Ok to do an add on to the 08/01/20 Pap smear and have cytology run this?

## 2020-08-15 NOTE — Telephone Encounter (Signed)
Add on request form faxed to [facility].  I called patient and per DPR access note left her a voice mail letting her know that this is being added on today and I will call her with results once they are received.

## 2020-08-16 NOTE — Telephone Encounter (Signed)
I called Cytology and spoke with Stanton Kidney to confirm receipt of my fax with add on request. She said she did receive and added it on yesterday afternoon.

## 2020-08-17 LAB — CYTOLOGY - PAP
Adequacy: ABSENT
Comment: NEGATIVE
Comment: NEGATIVE
Diagnosis: NEGATIVE
HPV 16: POSITIVE — AB
HPV 18 / 45: NEGATIVE
High risk HPV: POSITIVE — AB

## 2020-08-18 ENCOUNTER — Other Ambulatory Visit: Payer: Self-pay | Admitting: Obstetrics & Gynecology

## 2020-08-18 DIAGNOSIS — R87810 Cervical high risk human papillomavirus (HPV) DNA test positive: Secondary | ICD-10-CM

## 2020-08-23 NOTE — Telephone Encounter (Signed)
See result note documentation.  

## 2020-08-28 MED ORDER — EZETIMIBE 10 MG PO TABS: 10.0000 mg | ORAL_TABLET | Freq: Every day | ORAL | 11 refills | Status: DC

## 2020-09-17 NOTE — Progress Notes (Signed)
Cardiology Office Note:    Date:  09/21/2020   ID:  [name], DOB 1950-10-23, MRN [id number]  PCP:  Cari Caraway, [place]  Cardiologist:  Fransico Him, MD  Advanced Practice Provider:  No care team member to display Electrophysiologist:  Will Meredith Leeds, MD    Referring MD: Cari Caraway, MD    History of Present Illness:    [name] is a 70 y.o. female with a hx of HTN, HLD, paroxysmal SVT s/p ablation, and hypothyroidism who was previously followed by Dr. [name] who presents to for CV follow-up.  The patient has a long history of paroxysmal SVT. Underwent ablation for SVT at [facility] in 2001. Since then she has had infrequent episodes of SVT. Many of her episodes have occurred following surgical procedures after administration of anesthesia. She has had an echocardiogram on 02/02/13 which showed normal left ventricular function with ejection fraction 55-60% and no significant abnormalities. Cardiac monitor 12/17/2017 showed normal sinus rhythm, sinus bradycardia, sinus tachycardia with some nonsustained atrial tachycardia up to 13 beats and occasional PVCs with PVC burden of 1%.  Heart rate ranged from 42 to 185 bpm. Has been followed by Dr. Curt Bears with last visit in 2020 where she was doing well and was continued on her metoprolol.  During last visit on 06/19/20, the patient was having elevated blood pressures running 130s-170s. Also had episode of SVT in 04/2020. Following our visit on 06/23/20, she called the on-call line due to HR 142. She was instructed to take additional doses of her metop at that time. She was seen in EP clinic on 06/30/20 where she was recommended for possible ablation if symptoms recurred.  Today, the patient states that she overall feels well. Had one episode of SVT while receiving dental work following administration of a significant amount of lidocaine for numbing her tooth. After her  tooth extraction, she felt very dizzy and like she was going to pass out. She went to [facility] where HR were mildly elevated at that time. Received IVF and toradol with resolution of symptoms and was discharged home. No recurrent episodes since that time. Otherwise, blood pressures are well controlled. Off repatha due to weight gain and now on zetia. No chest pain, SOB, LE edema, or orthopnea. She states that if she develops continued recurrence of her SVT, she will pursue ablation at that time.  Past Medical History:  Diagnosis Date  . Arthritis    Neck  . CIN I (cervical intraepithelial neoplasia I) 2017  . Complication of anesthesia    delayed SVT:  can occur from a few hours to 3 days after the general anesthesia or conscious sedation as happened with her colonoscopy.   [name] Dysplasia of cervix, low grade (CIN 1)    LEEP cervical conization September 2017 margins negative CIN-1  . GERD (gastroesophageal reflux disease)   . Hashimoto's thyroiditis   . History of carpal tunnel syndrome    Left  . History of colon polyps 2010   Benign  . Hyperlipidemia   . Hypothyroidism    Hashimoto's per pt   . Osteopenia 10/2017   T score -1.9 FRAX 8.8% / 0.7%.  Stable from prior DEXA.  [name] SVT (supraventricular tachycardia) ([place])   . Wears glasses     Past Surgical History:  Procedure Laterality Date  . BREAST SURGERY  1976   CYST REMOVED  . CARDIAC ELECTROPHYSIOLOGY STUDY AND ABLATION  2001  . CESAREAN SECTION  [place]    . COLONOSCOPY WITH PROPOFOL N/A 02/12/2019   Procedure: COLONOSCOPY WITH PROPOFOL;  Surgeon: Wonda Horner, MD;  Location: [facility] ENDOSCOPY;  Service: Endoscopy;  Laterality: N/A;  . LEEP  2017  . left thumb surgery and Carpal Tunnel Release  9/14  . lower lid surgery   09/2018   dry eye from Hashimoto's  . POLYPECTOMY  02/12/2019   Procedure: POLYPECTOMY;  Surgeon: Wonda Horner, MD;  Location: [facility] ENDOSCOPY;  Service: Endoscopy;;    Current  Medications: Current Meds  Medication Sig  . aspirin EC 81 MG tablet Take 81 mg by mouth daily. Swallow whole.  . Cholecalciferol (VITAMIN D-3) 5000 units TABS Take 5,000 mg by mouth See admin instructions. Monday and Friday  . ezetimibe (ZETIA) 10 MG tablet Take 1 tablet (10 mg total) by mouth daily.  . fluticasone (FLONASE) 50 MCG/ACT nasal spray Place 1 spray into both nostrils as needed for allergies.   [name] levothyroxine (SYNTHROID) 88 MCG tablet Take 88 mcg by mouth daily.  . metoprolol succinate (TOPROL-XL) 50 MG 24 hr tablet Take 1 tablet (50 mg total) by mouth daily. Take with or immediately following a meal. Please make yearly appt with Dr. Curt Bears for April 2022 before anymore refills. Thank you 1st attempt  . Multiple Vitamin (MULTIVITAMIN WITH MINERALS) TABS tablet Take 1 tablet by mouth daily.  . polyethylene glycol (MIRALAX / GLYCOLAX) packet Take 17 g by mouth daily as needed for mild constipation.   . Probiotic Product (PROBIOTIC ADVANCED PO) Take 1 capsule by mouth daily.      Allergies:   Lipitor [atorvastatin], Praluent [alirocumab], Repatha [evolocumab], Diphenhydramine, and Epinephrine   Social History   Socioeconomic History  . Marital status: Widowed    Spouse name: Not on file  . Number of children: 1  . Years of education: Not on file  . Highest education level: Master's degree (e.g., MA, MS, MEng, MEd, MSW, MBA)  Occupational History  . Not on file  Tobacco Use  . Smoking status: Never Smoker  . Smokeless tobacco: Never Used  Vaping Use  . Vaping Use: Never used  Substance and Sexual Activity  . Alcohol use: Not Currently  . Drug use: No  . Sexual activity: Not Currently    Partners: Male    Birth control/protection: Post-menopausal    Comment: 70 YEARS OLD, NO MORE THAN 5 PARTNERS ,des neg  Other Topics Concern  . Not on file  Social History Narrative   Her daughter lives at home with her    Right handed   Caffeine: coffee, soda, (about 1 cup/day)    Social Determinants of Health   Financial Resource Strain: Not on file  Food Insecurity: Not on file  Transportation Needs: Not on file  Physical Activity: Not on file  Stress: Not on file  Social Connections: Not on file     Family History: The patient's family history includes Arthritis in her brother, father, mother, and sister; Colon polyps in her father; Glaucoma in her brother, father, and mother; Heart disease in her father and mother; Hypertension in her brother, father, and mother; Mitral valve prolapse in her brother and mother; Neuropathy in her mother; Pulmonary embolism in her father; Rheum arthritis in her father and mother; Stroke in her father and mother; Von Willebrand disease in her mother.  ROS:   Please see the history of present illness.    Review of Systems  Constitutional: Negative for chills and fever.  HENT: Negative for hearing loss.   Eyes: Negative for blurred vision.  Respiratory: Negative for shortness of breath.   Cardiovascular: Positive for palpitations. Negative for chest pain, orthopnea, claudication, leg swelling and PND.  Gastrointestinal: Negative for nausea and vomiting.  Genitourinary: Negative for dysuria.  Musculoskeletal: Negative for falls and myalgias.  Neurological: Positive for dizziness. Negative for loss of consciousness.  Endo/Heme/Allergies: Negative for polydipsia.  Psychiatric/Behavioral: Negative for substance abuse.    EKGs/Labs/Other Studies Reviewed:    The following studies were reviewed today: Cardiac monitor 01/26/18 - personally reviewed  Normal sinus rhythm, sinus bradycardia and sinus tachycardia with average heart rate 73 bpm. Heart rate ranged from 42 to 185 bpm.  Occasional PVCs with PVC burden 1%.  Nonsustained atrial tachycardia up to 13 beats.  TTE 2014: ------------------------------------------------------------  Left ventricle: The cavity size was normal. Systolic  function was normal. The estimated  ejection fraction was in  the range of 55% to 60%. Wall motion was normal; there were  no regional wall motion abnormalities.   ------------------------------------------------------------  Aortic valve:  Trileaflet; normal thickness leaflets.  Mobility was not restricted. Doppler: Transvalvular  velocity was within the normal range. There was no stenosis.  No regurgitation.   ------------------------------------------------------------  Aorta: Aortic root: The aortic root was normal in size.   ------------------------------------------------------------  Mitral valve:  Structurally normal valve.  Mobility was  not restricted. Doppler: Transvalvular velocity was within  the normal range. There was no evidence for stenosis.  Trivial regurgitation.  Peak gradient: 78mm Hg (D).   ------------------------------------------------------------  Left atrium: The atrium was normal in size.   ------------------------------------------------------------  Right ventricle: The cavity size was normal. Wall thickness  was normal. Systolic function was normal.   ------------------------------------------------------------  Pulmonic valve:  Doppler: Transvalvular velocity was  within the normal range. There was no evidence for stenosis.    ------------------------------------------------------------  Tricuspid valve:  Structurally normal valve.  Doppler:  Transvalvular velocity was within the normal range. Trivial  regurgitation.   ------------------------------------------------------------  Pulmonary artery:  The main pulmonary artery was  normal-sized. Systolic pressure was within the normal range.    ------------------------------------------------------------  Right atrium: The atrium was normal in size.   ------------------------------------------------------------  Pericardium: There was no pericardial effusion.    ------------------------------------------------------------  Systemic veins:  Inferior vena cava: The vessel was normal in size.    EKG:  EKG 05/08/20: NSR   Recent Labs: 10/14/2019: ALT 23 08/02/2020: BUN 18; Creatinine, Ser 0.94; Hemoglobin 13.6; Platelets 164; Potassium 3.9; Sodium 139  Recent Lipid Panel    Component Value Date/Time   CHOL 187 04/06/2020 1113   TRIG 140 04/06/2020 1113   HDL 72 04/06/2020 1113   CHOLHDL 2.6 04/06/2020 1113   CHOLHDL 3.4 12/27/2014 1055   VLDL 26 12/27/2014 1055   LDLCALC 91 04/06/2020 1113     Physical Exam:    VS:  BP 124/84   Pulse 75   Ht 5\' 3"  (1.6 m)   Wt 172 lb (78 kg)   LMP 11/26/2000   SpO2 95%   BMI 30.47 kg/m     Wt Readings from Last 3 Encounters:  09/21/20 172 lb (78 kg)  08/02/20 172 lb (78 kg)  06/30/20 175 lb 12.8 oz (79.7 kg)     GEN:  Well nourished, well developed in no acute distress HEENT: Normal NECK: No JVD; No carotid bruits CARDIAC: RRR, Soft systolic murmur at RUSB, no rubs or gallops RESPIRATORY:  Clear to auscultation without rales, wheezing or rhonchi  ABDOMEN: Soft, non-tender, non-distended MUSCULOSKELETAL:  No edema; No deformity  SKIN: Warm and dry NEUROLOGIC:  Alert and oriented x 3 PSYCHIATRIC:  Normal affect   ASSESSMENT:    1. SVT (supraventricular tachycardia) ([place])   2. Primary hypertension   3. Mixed hyperlipidemia   4. Statin myopathy    PLAN:    In order of problems listed above:  #Paroxysmal SVT s/p Ablation: Has long history of SVT s/p ablation at [facility] in 2001. TTE in 2014 with normal LVEF, no significant valvular disease. Had several recurrences mainly after surgical procedures and administration of anesthesia. Had recurrence of SVT in 04/2020 and 06/2020. Possible plan for repeat ablation if symptoms continue to recur. -Follow-up with Dr. Curt Bears as scheduled -Continue metop 50mg  XL -Discussed importance of maintaining hydration and avoiding excessive  caffeine  #HTN: -Continue metop 50mg  XL as above -Off losartan due to low blood pressures on the medication  #HLD: #Statin Intolerance: Unable to tolerate statins due to myalgias and repatha stopped due to weight gain. Currently on zetia 10mg  daily with LDL 91 in 03/2020. Will check coroanry calcium score for risk stratfication. -Check coronary calcium score -Continue zetia10mg  daily; if Ca score elevated, can do nexlezet   Medication Adjustments/Labs and Tests Ordered: Current medicines are reviewed at length with the patient today.  Concerns regarding medicines are outlined above.  Orders Placed This Encounter  Procedures  . CT CARDIAC SCORING (SELF PAY ONLY)   No orders of the defined types were placed in this encounter.   Patient Instructions  Medication Instructions:  Continue current medications  *If you need a refill on your cardiac medications before your next appointment, please call your pharmacy*   Lab Work: none If you have labs (blood work) drawn today and your tests are completely normal, you will receive your results only by: [name] MyChart Message (if you have MyChart) OR . A paper copy in the mail If you have any lab test that is abnormal or we need to change your treatment, we will call you to review the results.   Testing/Procedures: Cardiac Calcium Score   Follow-Up: At [facility], you and your health needs are our priority.  As part of our continuing mission to provide you with exceptional heart care, we have created designated Provider Care Teams.  These Care Teams include your primary Cardiologist (physician) and Advanced Practice Providers (APPs -  Physician Assistants and Nurse Practitioners) who all work together to provide you with the care you need, when you need it.  We recommend signing up for the patient portal called "MyChart".  Sign up information is provided on this After Visit Summary.  MyChart is used to connect with patients for Virtual  Visits (Telemedicine).  Patients are able to view lab/test results, encounter notes, upcoming appointments, etc.  Non-urgent messages can be sent to your provider as well.   To learn more about what you can do with MyChart, go to [email or web address].    Your next appointment:   6 month(s)  The format for your next appointment:   In Person  Provider:   Gwyndolyn Kaufman, MD   Other Instructions none    Signed, Freada Bergeron, MD  09/21/2020 11:00 AM    [place]

## 2020-09-21 ENCOUNTER — Encounter: Payer: Self-pay | Admitting: Cardiology

## 2020-09-21 ENCOUNTER — Other Ambulatory Visit: Payer: Self-pay

## 2020-09-21 ENCOUNTER — Ambulatory Visit: Payer: Medicare Other | Admitting: Cardiology

## 2020-09-21 VITALS — BP 124/84 | HR 75 | Ht 63.0 in | Wt 172.0 lb

## 2020-09-21 DIAGNOSIS — I10 Essential (primary) hypertension: Secondary | ICD-10-CM | POA: Diagnosis not present

## 2020-09-21 DIAGNOSIS — I471 Supraventricular tachycardia: Secondary | ICD-10-CM | POA: Diagnosis not present

## 2020-09-21 DIAGNOSIS — E782 Mixed hyperlipidemia: Secondary | ICD-10-CM | POA: Diagnosis not present

## 2020-09-21 DIAGNOSIS — T466X5A Adverse effect of antihyperlipidemic and antiarteriosclerotic drugs, initial encounter: Secondary | ICD-10-CM

## 2020-09-21 DIAGNOSIS — G720 Drug-induced myopathy: Secondary | ICD-10-CM | POA: Diagnosis not present

## 2020-09-21 DIAGNOSIS — T466X5D Adverse effect of antihyperlipidemic and antiarteriosclerotic drugs, subsequent encounter: Secondary | ICD-10-CM

## 2020-09-21 NOTE — Patient Instructions (Signed)
Medication Instructions:  Continue current medications  *If you need a refill on your cardiac medications before your next appointment, please call your pharmacy*   Lab Work: none If you have labs (blood work) drawn today and your tests are completely normal, you will receive your results only by: [name] MyChart Message (if you have MyChart) OR . A paper copy in the mail If you have any lab test that is abnormal or we need to change your treatment, we will call you to review the results.   Testing/Procedures: Cardiac Calcium Score   Follow-Up: At [facility], you and your health needs are our priority.  As part of our continuing mission to provide you with exceptional heart care, we have created designated Provider Care Teams.  These Care Teams include your primary Cardiologist (physician) and Advanced Practice Providers (APPs -  Physician Assistants and Nurse Practitioners) who all work together to provide you with the care you need, when you need it.  We recommend signing up for the patient portal called "MyChart".  Sign up information is provided on this After Visit Summary.  MyChart is used to connect with patients for Virtual Visits (Telemedicine).  Patients are able to view lab/test results, encounter notes, upcoming appointments, etc.  Non-urgent messages can be sent to your provider as well.   To learn more about what you can do with MyChart, go to [email or web address].    Your next appointment:   6 month(s)  The format for your next appointment:   In Person  Provider:   Gwyndolyn Kaufman, MD   Other Instructions none

## 2020-09-22 ENCOUNTER — Telehealth: Payer: Self-pay | Admitting: Cardiology

## 2020-09-22 NOTE — Telephone Encounter (Signed)
New Message:      Pt says her Orthopedic wants her to start on a 12 days dose pack of Steroids for back pain. She said that doctor said her doctor said the Steroids could make her nervous and cause palpitations. What does she need to do?

## 2020-09-22 NOTE — Telephone Encounter (Signed)
She is completely right. Steroids can really cause palpitations or make her flip into SVT. If she needs it for her back pain, we will just work with it.  We can give her metop tartrate 25mg  TID prn to take as needed if she develops recurrent SVT/palpitations so she has a pill in the pocket to help manage her symptoms if they arise. She should continue her metoprolol succinate 50mg  XL daily and only take the metop tartrate for breakthrough symptoms.   Keep [place] posted.

## 2020-09-26 MED ORDER — METOPROLOL TARTRATE 25 MG PO TABS
25.0000 mg | ORAL_TABLET | Freq: Three times a day (TID) | ORAL | 1 refills | Status: DC | PRN
Start: 2020-09-26 — End: 2020-10-09

## 2020-09-26 NOTE — Telephone Encounter (Signed)
Pt made aware of recommendations per Dr. Johney Frame. Confirmed the pharmacy of choice with the pt. Pt education provided on how to take Metoprolol tartrate 25 mg TID PRN for recurrent palpitations/SVT.  Advised her to still continue scheduled Toprol XL 50 mg po daily. Advised her to keep [place] posted as needed.  Pt verbalized understanding and agrees with this plan.

## 2020-09-26 NOTE — Telephone Encounter (Signed)
[name] is calling back in regards to this due to never hearing back. Please advise.

## 2020-10-09 ENCOUNTER — Other Ambulatory Visit: Payer: Self-pay | Admitting: Sports Medicine

## 2020-10-09 ENCOUNTER — Other Ambulatory Visit: Payer: Self-pay | Admitting: Cardiology

## 2020-10-09 DIAGNOSIS — M545 Low back pain, unspecified: Secondary | ICD-10-CM

## 2020-10-09 DIAGNOSIS — G8929 Other chronic pain: Secondary | ICD-10-CM

## 2020-10-09 MED ORDER — METOPROLOL TARTRATE 25 MG PO TABS: 25.0000 mg | ORAL_TABLET | Freq: Three times a day (TID) | ORAL | 3 refills | Status: DC | PRN

## 2020-10-12 ENCOUNTER — Other Ambulatory Visit: Payer: Self-pay | Admitting: Cardiology

## 2020-10-20 ENCOUNTER — Other Ambulatory Visit: Payer: Medicare Other

## 2020-10-31 ENCOUNTER — Ambulatory Visit (INDEPENDENT_AMBULATORY_CARE_PROVIDER_SITE_OTHER)
Admission: RE | Admit: 2020-10-31 | Discharge: 2020-10-31 | Disposition: A | Discharge status: Home / Self Care | Payer: Self-pay | Source: Ambulatory Visit | Attending: Cardiology | Admitting: Cardiology

## 2020-10-31 ENCOUNTER — Other Ambulatory Visit: Payer: Self-pay

## 2020-10-31 DIAGNOSIS — I471 Supraventricular tachycardia: Secondary | ICD-10-CM

## 2020-11-03 ENCOUNTER — Other Ambulatory Visit: Payer: Self-pay | Admitting: Cardiology

## 2020-11-19 ENCOUNTER — Other Ambulatory Visit: Payer: Self-pay

## 2020-11-19 ENCOUNTER — Ambulatory Visit
Admission: EM | Admit: 2020-11-19 | Discharge: 2020-11-19 | Disposition: A | Discharge status: Home / Self Care | Payer: Medicare Other | Attending: Family Medicine | Admitting: Family Medicine

## 2020-11-19 DIAGNOSIS — Z1152 Encounter for screening for COVID-19: Secondary | ICD-10-CM

## 2020-11-19 DIAGNOSIS — J00 Acute nasopharyngitis [common cold]: Secondary | ICD-10-CM | POA: Diagnosis not present

## 2020-11-19 MED ORDER — FLUTICASONE PROPIONATE 50 MCG/ACT NA SUSP: 2.0000 | Freq: Every day | NASAL | 0 refills | Status: AC

## 2020-11-19 NOTE — ED Triage Notes (Signed)
Pt presents with c/o post nasal drip and sneezing

## 2020-11-19 NOTE — Discharge Instructions (Addendum)
Your COVID 19 results should result within 2-5 days. Negative results are immediately resulted to Mychart. Positive results will receive a follow-up call from our clinic. If symptoms are present, I recommend home quarantine until results are known.  Alternate Tylenol and ibuprofen as needed for body aches and fever.  Symptom management per recommendations discussed today.  If any breathing difficulty or chest pain develops go immediately to the closest emergency department for evaluation.

## 2020-11-19 NOTE — ED Provider Notes (Signed)
[name]    CSN: [id number] Arrival date & time: 11/19/20  1056      History   Chief Complaint Chief Complaint  Patient presents with   Nasal Congestion    HPI [name] is a 70 y.o. female.   HPI Patient in for evaluation of nasal congestion, postnasal drainage and sneezing x3 days.  She reports last night symptoms worsened and she experienced persistent drainage and fatigue.  She has a low-grade temperature on arrival at 99.1.  She has taken cetirizine and Nasonex for nasal symptoms without improvement.  Denies any shortness of breath or chest pain.  No known exposure to anyone positive for COVID.  Past Medical History:  Diagnosis Date   Arthritis    Neck   CIN I (cervical intraepithelial neoplasia I) 0000000   Complication of anesthesia    delayed SVT:  can occur from a few hours to 3 days after the general anesthesia or conscious sedation as happened with her colonoscopy.    Dysplasia of cervix, low grade (CIN 1)    LEEP cervical conization September 2017 margins negative CIN-1   GERD (gastroesophageal reflux disease)    Hashimoto's thyroiditis    History of carpal tunnel syndrome    Left   History of colon polyps 2010   Benign   Hyperlipidemia    Hypothyroidism    Hashimoto's per pt    Osteopenia 10/2017   T score -1.9 FRAX 8.8% / 0.7%.  Stable from prior DEXA.   SVT (supraventricular tachycardia) (HCC)    Wears glasses     Patient Active Problem List   Diagnosis Date Noted   Statin myopathy 09/07/2019   Carpal tunnel syndrome of left wrist 03/25/2019   Abnormal CT of the abdomen 02/25/2018   Abdominal pain, right upper quadrant 02/25/2018   Abnormal finding on radiology exam 02/25/2018   Allergic rhinitis 02/25/2018   Brash 02/25/2018   CN (constipation) 02/25/2018   LBP (low back pain) 02/25/2018   Combined form of age-related cataract, both eyes 04/07/2017   Posterior vitreous detachment of right eye 04/07/2017   Thyroid eye  disease 04/07/2017   CIN I (cervical intraepithelial neoplasia I) 01/02/2016   Preoperative cardiovascular examination 11/10/2015   ASCUS with positive high risk HPV cervical 12/13/2014   Eyelid retraction 12/02/2014   Dry eye syndrome of bilateral lacrimal glands 12/01/2014   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   Other disorders affecting eyelid function 12/01/2014   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   Peripheral visual field defect of both eyes 12/01/2014   Age-related nuclear cataract of both eyes 09/06/2014   Anatomical narrow angle of both eyes 09/06/2014   Hx of Hashimoto thyroiditis 09/06/2014   Keratoconjunctivitis sicca of both eyes not specified as Sjogren's 09/06/2014   Meibomian gland dysfunction (MGD) of upper and lower lids of both eyes 09/06/2014   Abnormal Papanicolaou smear of cervix with positive human papilloma virus (HPV) test 06/27/2014   Hypokalemia 03/04/2014   History of paroxysmal supraventricular tachycardia 02/25/2014   Osteopenia 04/30/2013   Dysphonia 04/05/2013   Laryngopharyngeal reflux 04/05/2013   Hashimoto's thyroiditis 03/29/2013   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   Hypercalcemia 05/25/2012   Hypothyroidism 04/27/2012   Hyperlipidemia 04/27/2012   Paroxysmal supraventricular tachycardia ([place]) 04/27/2012   Tachycardia 01/22/2012   Endometrial polyp 12/04/2010   Fibroid, uterus 11/27/2010   Acquired hypothyroidism 01/17/2010   Other and unspecified hyperlipidemia 02/01/2008   Other specified cardiac dysrhythmias(427.89) 12/07/2007  Past Surgical History:  Procedure Laterality Date   BREAST SURGERY  1976   CYST REMOVED   CARDIAC ELECTROPHYSIOLOGY STUDY AND ABLATION  2001   CESAREAN SECTION  1987   COLONOSCOPY     COLONOSCOPY WITH PROPOFOL N/A 02/12/2019   Procedure: COLONOSCOPY WITH PROPOFOL;  Surgeon: Wonda Horner, MD;  Location: [facility];  Service: Endoscopy;  Laterality: N/A;   LEEP   2017   left thumb surgery and Carpal Tunnel Release  9/14   lower lid surgery   09/2018   dry eye from Hashimoto's   POLYPECTOMY  02/12/2019   Procedure: POLYPECTOMY;  Surgeon: Wonda Horner, MD;  Location: [facility];  Service: Endoscopy;;    OB History     Gravida  1   Para  1   Term  1   Preterm      AB      Living  1      SAB      IAB      Ectopic      Multiple      Live Births               Home Medications    Prior to Admission medications   Medication Sig Start Date End Date Taking? Authorizing Provider  fluticasone (FLONASE) 50 MCG/ACT nasal spray Place 2 sprays into both nostrils daily. 11/19/20  Yes Scot Jun, FNP  aspirin EC 81 MG tablet Take 81 mg by mouth daily. Swallow whole.    [provider]  Cholecalciferol (VITAMIN D-3) 5000 units TABS Take 5,000 mg by mouth See admin instructions. Monday and Friday    [provider]  ezetimibe (ZETIA) 10 MG tablet Take 1 tablet (10 mg total) by mouth daily. 08/28/20   Freada Bergeron, MD  levothyroxine (SYNTHROID) 88 MCG tablet Take 88 mcg by mouth daily. 03/15/20   [provider]  metoprolol succinate (TOPROL-XL) 50 MG 24 hr tablet Take 1 tablet (50 mg total) by mouth daily. Take with or immediately following a meal. 10/09/20   Camnitz, Ocie Doyne, MD  metoprolol tartrate (LOPRESSOR) 25 MG tablet Take 1 tablet (25 mg total) by mouth 3 (three) times daily as needed (for recurrent  palpitations/SVT). 10/09/20   Camnitz, Ocie Doyne, MD  Multiple Vitamin (MULTIVITAMIN WITH MINERALS) TABS tablet Take 1 tablet by mouth daily.    [provider]  polyethylene glycol (MIRALAX / GLYCOLAX) packet Take 17 g by mouth daily as needed for mild constipation.     [provider]  Probiotic Product (PROBIOTIC ADVANCED PO) Take 1 capsule by mouth daily.     [provider]    Family History Family History  Problem Relation Age of Onset   Hypertension  Mother    Heart disease Mother    Mitral valve prolapse Mother    Glaucoma Mother    Arthritis Mother    Von Willebrand disease Mother    Stroke Mother    Rheum arthritis Mother    Neuropathy Mother    Hypertension Father    Heart disease Father    Colon polyps Father    Glaucoma Father    Arthritis Father    Stroke Father    Rheum arthritis Father    Pulmonary embolism Father    Hypertension Brother    Mitral valve prolapse Brother    Glaucoma Brother    Arthritis Brother    Arthritis Sister     Social History Social History  Tobacco Use   Smoking status: Never   Smokeless tobacco: Never  Vaping Use   Vaping Use: Never used  Substance Use Topics   Alcohol use: Not Currently   Drug use: No     Allergies   Lipitor [atorvastatin], Praluent [alirocumab], Repatha [evolocumab], Diphenhydramine, and Epinephrine   Review of Systems Review of Systems Pertinent negatives listed in HPI  Physical Exam Triage Vital Signs ED Triage Vitals [11/19/20 1106]  Enc Vitals Group     BP 126/73     Pulse Rate 83     Resp 18     Temp 99.1 F (37.3 C)     Temp Source Oral     SpO2 95 %     Weight      Height      Head Circumference      Peak Flow      Pain Score      Pain Loc      Pain Edu?      Excl. in [place]?    No data found.  Updated Vital Signs BP 126/73 (BP Location: Left Arm)   Pulse 83   Temp 99.1 F (37.3 C) (Oral)   Resp 18   LMP 11/26/2000   SpO2 95%   Visual Acuity Right Eye Distance:   Left Eye Distance:   Bilateral Distance:    Right Eye Near:   Left Eye Near:    Bilateral Near:     Physical Exam  General Appearance:    Alert, cooperative, no distress  HENT:   Normocephalic, ears normal, nares mucosal edema with congestion, rhinorrhea, oropharynx clear without exudate or erythema.  Eyes:    PERRL, conjunctiva/corneas clear, EOM's intact       Lungs:     Clear to auscultation bilaterally, respirations unlabored  Heart:    Regular rate and  rhythm  Neurologic:   Awake, alert, oriented x 3. No apparent focal neurological deficit.     UC Treatments / Results  Labs (all labs ordered are listed, but only abnormal results are displayed) Labs Reviewed  COVID-19, FLU A+B NAA    EKG   Radiology No results found.  Procedures Procedures (including critical care time)  Medications Ordered in UC Medications - No data to display  Initial Impression / Assessment and Plan / UC Course  I have reviewed the triage vital signs and the nursing notes.  Pertinent labs & imaging results that were available during my care of the patient were reviewed by me and considered in my medical decision making (see chart for details).     Acute rhinitis symptomatic treatment warranted continue cetirizine added Flonase to current regimen.  Hydrate well with fluids.  COVID flu test pending.  Manage fever with Tylenol.  Nasal symptoms with over-the-counter antihistamines recommended.  Treatment per discharge medications/discharge instructions.  Red flags/ER precautions given. The most current CDC isolation/quarantine recommendation advised.   Final Clinical Impressions(s) / UC Diagnoses   Final diagnoses:  Acute rhinitis  Encounter for screening for COVID-19     Discharge Instructions      Your COVID 19 results should result within 2-5 days. Negative results are immediately resulted to Mychart. Positive results will receive a follow-up call from our clinic. If symptoms are present, I recommend home quarantine until results are known.  Alternate Tylenol and ibuprofen as needed for body aches and fever.  Symptom management per recommendations discussed today.  If any breathing difficulty or chest pain develops go immediately to the  closest emergency department for evaluation.    ED Prescriptions     Medication Sig Dispense Auth. Provider   fluticasone (FLONASE) 50 MCG/ACT nasal spray Place 2 sprays into both nostrils daily. 16 g Scot Jun, FNP      PDMP not reviewed this encounter.   Scot Jun, FNP 11/19/20 [phone]

## 2020-11-22 ENCOUNTER — Other Ambulatory Visit: Payer: Self-pay

## 2020-11-22 ENCOUNTER — Telehealth: Payer: Self-pay | Admitting: Cardiology

## 2020-11-22 DIAGNOSIS — E785 Hyperlipidemia, unspecified: Secondary | ICD-10-CM

## 2020-11-22 DIAGNOSIS — B977 Papillomavirus as the cause of diseases classified elsewhere: Secondary | ICD-10-CM

## 2020-11-22 DIAGNOSIS — N870 Mild cervical dysplasia: Secondary | ICD-10-CM

## 2020-11-22 LAB — COVID-19, FLU A+B NAA
Influenza A, NAA: NOT DETECTED
Influenza B, NAA: NOT DETECTED
SARS-CoV-2, NAA: NOT DETECTED

## 2020-11-22 NOTE — Telephone Encounter (Signed)
Called pt and LVM to schedule follow up labs. Left direct phone number. Labs ordered.

## 2020-11-22 NOTE — Telephone Encounter (Signed)
Spoke with pt to inquire about what labs she was calling about because it doesn't appear that we regularly order labs on her.  Pt states that she told operator it was follow up labs for Zetia and that she needed to speak with the pharmacy team because they ordered the [place].  Advised I will get message over to Pharmacy team so they can reach out to her.  Pt appreciative for assistance.

## 2020-11-22 NOTE — Telephone Encounter (Signed)
Pt states that it is time for her to complete her labs and would like to be scheduled (no order on file) please advise.

## 2020-11-28 ENCOUNTER — Other Ambulatory Visit: Payer: Self-pay

## 2020-11-28 ENCOUNTER — Other Ambulatory Visit: Payer: Medicare Other

## 2020-11-28 DIAGNOSIS — E785 Hyperlipidemia, unspecified: Secondary | ICD-10-CM

## 2020-11-28 LAB — HEPATIC FUNCTION PANEL
ALT: 28 IU/L (ref 0–32)
AST: 30 IU/L (ref 0–40)
Albumin: 4.7 g/dL (ref 3.8–4.8)
Alkaline Phosphatase: 94 IU/L (ref 44–121)
Bilirubin Total: 0.3 mg/dL (ref 0.0–1.2)
Bilirubin, Direct: <0.1 mg/dL (ref 0.00–0.40)
Total Protein: 6.9 g/dL (ref 6.0–8.5)

## 2020-11-28 LAB — LIPID PANEL
Chol/HDL Ratio: 4.6 ratio — ABNORMAL HIGH (ref 0.0–4.4)
Cholesterol, Total: 239 mg/dL — ABNORMAL HIGH (ref 100–199)
HDL: 52 mg/dL (ref >39)
LDL Chol Calc (NIH): 164 mg/dL — ABNORMAL HIGH (ref 0–99)
Triglycerides: 126 mg/dL (ref 0–149)
VLDL Cholesterol Cal: 23 mg/dL (ref 5–40)

## 2020-12-05 MED ORDER — PRAVASTATIN SODIUM 20 MG PO TABS: 20.0000 mg | ORAL_TABLET | Freq: Every evening | ORAL | 11 refills | Status: DC

## 2021-01-04 ENCOUNTER — Ambulatory Visit: Payer: Medicare Other | Admitting: Nurse Practitioner

## 2021-01-15 ENCOUNTER — Encounter: Payer: Medicare Other | Admitting: Obstetrics and Gynecology

## 2021-01-17 ENCOUNTER — Other Ambulatory Visit (HOSPITAL_COMMUNITY)
Admission: RE | Admit: 2021-01-17 | Discharge: 2021-01-17 | Disposition: A | Discharge status: Home / Self Care | Payer: Medicare Other | Source: Ambulatory Visit | Attending: Obstetrics & Gynecology | Admitting: Obstetrics & Gynecology

## 2021-01-17 ENCOUNTER — Other Ambulatory Visit: Payer: Self-pay

## 2021-01-17 ENCOUNTER — Ambulatory Visit (INDEPENDENT_AMBULATORY_CARE_PROVIDER_SITE_OTHER): Payer: Medicare Other | Admitting: Obstetrics & Gynecology

## 2021-01-17 ENCOUNTER — Encounter: Payer: Self-pay | Admitting: Obstetrics & Gynecology

## 2021-01-17 DIAGNOSIS — N870 Mild cervical dysplasia: Secondary | ICD-10-CM | POA: Diagnosis not present

## 2021-01-17 DIAGNOSIS — Z01411 Encounter for gynecological examination (general) (routine) with abnormal findings: Secondary | ICD-10-CM | POA: Insufficient documentation

## 2021-01-17 DIAGNOSIS — B977 Papillomavirus as the cause of diseases classified elsewhere: Secondary | ICD-10-CM | POA: Insufficient documentation

## 2021-01-17 NOTE — Progress Notes (Signed)
    [name] 11-Nov-1950 [id number]        70 y.o.  G1P1001   RP: HPV 16/CIN 1 for f/u Colposcopy  HPI: HPV 16 pos and CIN 1 at [facility] 07/2020.   OB History  Gravida Para Term Preterm AB Living  1 1 1     1   SAB IAB Ectopic Multiple Live Births               # Outcome Date GA Lbr Len/2nd Weight Sex Delivery Anes PTL Lv  1 Term             Past medical history,surgical history, problem list, medications, allergies, family history and social history were all reviewed and documented in the EPIC chart.   Directed ROS with pertinent positives and negatives documented in the history of present illness/assessment and plan.  Exam:  Vitals:   01/17/21 1502  BP: 120/78   General appearance:  Normal   Colposcopy Procedure Note Artemis Koller 01/17/2021  Indications:  F/U Colpo for HPV 16 pos/CIN 1 on Colpo 07/2020  Procedure Details  The risks and benefits of the procedure and Verbal informed consent obtained.  Speculum placed in vagina and excellent visualization of cervix achieved, cervix swabbed x 3 with acetic acid solution.  Findings:  Cervix colposcopy: Physical Exam Genitourinary:       Vaginal colposcopy: Normal  Vulvar colposcopy: Normal  Perirectal colposcopy: Normal  The cervix was sprayed with [place] before performing the cervical biopsies.  Specimens: HPV 16-18-45.  Cervical Bxs at 4 and 12 O'Clock.  Complications: None, good hemostasis with Silver Nitrate and Moncel Solution. . Plan: Management per results.   Assessment/Plan:  70 y.o. G1P1001   1. Cervical intraepithelial neoplasia grade 1 Last colposcopy April 2022 with HPV 16 present and CIN-1.  Colposcopy procedure done and findings reviewed with patient.  Management options per results reviewed with [facility] including Pap test, repeat colposcopies, LEEP and hysterectomy depending on the severity of dysplasia.  Postprocedure precautions reviewed.   - Colposcopy -  Cytology - PAP( [place]) - Surgical pathology( [place]/ [facility])  2. High risk HPV infection - Colposcopy - Cytology - PAP( [place]) - Surgical pathology( / [facility])  Other orders - XIIDRA 5 % SOLN; Apply 1 drop to eye 2 (two) times daily.   Princess Bruins MD, 3:42 PM 01/17/2021

## 2021-01-19 ENCOUNTER — Other Ambulatory Visit: Payer: Self-pay

## 2021-01-19 ENCOUNTER — Ambulatory Visit
Admission: RE | Admit: 2021-01-19 | Discharge: 2021-01-19 | Disposition: A | Discharge status: Home / Self Care | Payer: Medicare Other | Source: Ambulatory Visit | Attending: Sports Medicine | Admitting: Sports Medicine

## 2021-01-19 DIAGNOSIS — G8929 Other chronic pain: Secondary | ICD-10-CM

## 2021-01-19 DIAGNOSIS — M545 Low back pain, unspecified: Secondary | ICD-10-CM

## 2021-01-19 LAB — SURGICAL PATHOLOGY

## 2021-01-19 MED ORDER — METHYLPREDNISOLONE ACETATE 40 MG/ML INJ SUSP (RADIOLOG
80.0000 mg | Freq: Once | INTRAMUSCULAR | Status: AC
  Administered 2021-01-19: 80 mg via EPIDURAL

## 2021-01-19 MED ORDER — IOPAMIDOL (ISOVUE-M 200) INJECTION 41%
1.0000 mL | Freq: Once | INTRAMUSCULAR | Status: AC
  Administered 2021-01-19: 1 mL via EPIDURAL

## 2021-01-19 NOTE — Discharge Instructions (Signed)

## 2021-01-22 LAB — CYTOLOGY - PAP

## 2021-02-01 ENCOUNTER — Ambulatory Visit: Payer: Medicare Other | Admitting: Obstetrics & Gynecology

## 2021-02-01 ENCOUNTER — Other Ambulatory Visit: Payer: Self-pay

## 2021-02-01 ENCOUNTER — Encounter: Payer: Self-pay | Admitting: Obstetrics & Gynecology

## 2021-02-01 ENCOUNTER — Telehealth: Payer: Self-pay

## 2021-02-01 VITALS — BP 122/80 | Temp 98.7°F

## 2021-02-01 DIAGNOSIS — R350 Frequency of micturition: Secondary | ICD-10-CM

## 2021-02-01 DIAGNOSIS — N898 Other specified noninflammatory disorders of vagina: Secondary | ICD-10-CM

## 2021-02-01 LAB — WET PREP FOR TRICH, YEAST, CLUE

## 2021-02-01 NOTE — Progress Notes (Signed)
    [name] 06-29-1950 [id number]        70 y.o.  G1P1001   RP: Urinary frequency/dysuria and vaginal odor  HPI: Urinary frequency/dysuria and vaginal odor.  No pelvic pain.  No vaginal bleeding.  No fever.   OB History  Gravida Para Term Preterm AB Living  1 1 1     1   SAB IAB Ectopic Multiple Live Births               # Outcome Date GA Lbr Len/2nd Weight Sex Delivery Anes PTL Lv  1 Term             Past medical history,surgical history, problem list, medications, allergies, family history and social history were all reviewed and documented in the EPIC chart.   Directed ROS with pertinent positives and negatives documented in the history of present illness/assessment and plan.  Exam:  Vitals:   02/01/21 0939  BP: 122/80  Temp: 98.7 F (37.1 C)  TempSrc: Oral   General appearance:  Normal  Abdomen: Normal  Gynecologic exam: Vulva normal.  Speculum: Cervix healing from Colpo with mild spotting. Vagina normal.  Wet prep done.  Wet prep: Negative U/A: Yellow cloudy, nitrites negative, protein negative, white blood cells 0-5, red blood cells 0-2, bacteria few.  Urine culture pending.   Assessment/Plan:  70 y.o. G1P1001   1. Urinary frequency Urine analysis mildly perturbed.  Will wait on U. Culture to decide on treatment.  Recommend to push hydration with water.  Avoid bladder irritants such as caffeine. - Urinalysis,Complete w/RFL Culture  2. Vaginal odor Wet prep Negative, patient reassured.  Other orders - HYDROcodone-acetaminophen (NORCO) 10-325 MG tablet; Take by mouth.    Jessica Bruins MD, 9:46 AM 02/01/2021

## 2021-02-01 NOTE — Telephone Encounter (Signed)
Message sent to patients mychart regarding results from todays visit

## 2021-02-03 LAB — URINALYSIS, COMPLETE W/RFL CULTURE
Bilirubin Urine: NEGATIVE
Casts: NONE SEEN /LPF
Crystals: NONE SEEN /HPF
Glucose, UA: NEGATIVE
Hyaline Cast: NONE SEEN /LPF
Ketones, ur: NEGATIVE
Nitrites, Initial: NEGATIVE
Protein, ur: NEGATIVE
Specific Gravity, Urine: 1.026 (ref 1.001–1.035)
Yeast: NONE SEEN /HPF
pH: 5 (ref 5.0–8.0)

## 2021-02-03 LAB — URINE CULTURE
MICRO NUMBER:: 12498720
SPECIMEN QUALITY:: ADEQUATE

## 2021-02-03 LAB — CULTURE INDICATED

## 2021-02-05 ENCOUNTER — Telehealth: Payer: Self-pay | Admitting: *Deleted

## 2021-02-05 NOTE — Telephone Encounter (Signed)
-----   Message from Princess Bruins, MD sent at 02/01/2021 11:05 PM EDT ----- CIN 1 Repeat Pap in 6 months.

## 2021-02-05 NOTE — Telephone Encounter (Signed)
Patient informed with below note, reports the vaginal odor is still there. Reports the smell is getting on her clothes, she also reports slight yellow discharge. Please advise

## 2021-02-09 MED ORDER — TINIDAZOLE 500 MG PO TABS: ORAL_TABLET | ORAL | 0 refills | Status: DC

## 2021-02-09 NOTE — Telephone Encounter (Signed)
Left detailed message on cell per DPR access, Rx sent.

## 2021-02-09 NOTE — Telephone Encounter (Addendum)
Per Dr. Dellis Filbert we will treat with Tindamax 2 tab bid for 2 days Call make appt if not better.

## 2021-02-19 ENCOUNTER — Other Ambulatory Visit: Payer: Self-pay

## 2021-02-19 ENCOUNTER — Emergency Department: Payer: Medicare Other

## 2021-02-19 DIAGNOSIS — R0789 Other chest pain: Secondary | ICD-10-CM | POA: Diagnosis present

## 2021-02-19 DIAGNOSIS — E039 Hypothyroidism, unspecified: Secondary | ICD-10-CM | POA: Insufficient documentation

## 2021-02-19 DIAGNOSIS — R0602 Shortness of breath: Secondary | ICD-10-CM | POA: Diagnosis not present

## 2021-02-19 DIAGNOSIS — Z79899 Other long term (current) drug therapy: Secondary | ICD-10-CM | POA: Diagnosis not present

## 2021-02-19 LAB — URINALYSIS, COMPLETE (UACMP) WITH MICROSCOPIC
Bacteria, UA: NONE SEEN
Bilirubin Urine: NEGATIVE
Glucose, UA: NEGATIVE mg/dL
Ketones, ur: NEGATIVE mg/dL
Leukocytes,Ua: NEGATIVE
Nitrite: NEGATIVE
Protein, ur: NEGATIVE mg/dL
Specific Gravity, Urine: 1.017 (ref 1.005–1.030)
pH: 5 (ref 5.0–8.0)

## 2021-02-19 LAB — COMPREHENSIVE METABOLIC PANEL
ALT: 23 U/L (ref 0–44)
AST: 23 U/L (ref 15–41)
Albumin: 4.5 g/dL (ref 3.5–5.0)
Alkaline Phosphatase: 72 U/L (ref 38–126)
Anion gap: 5 (ref 5–15)
BUN: 16 mg/dL (ref 8–23)
CO2: 27 mmol/L (ref 22–32)
Calcium: 10 mg/dL (ref 8.9–10.3)
Chloride: 107 mmol/L (ref 98–111)
Creatinine, Ser: 0.75 mg/dL (ref 0.44–1.00)
GFR, Estimated: >60 mL/min (ref >60)
Glucose, Bld: 98 mg/dL (ref 70–99)
Potassium: 3.8 mmol/L (ref 3.5–5.1)
Sodium: 139 mmol/L (ref 135–145)
Total Bilirubin: 0.8 mg/dL (ref 0.3–1.2)
Total Protein: 7 g/dL (ref 6.5–8.1)

## 2021-02-19 LAB — CBC WITH DIFFERENTIAL/PLATELET
Abs Immature Granulocytes: 0 10*3/uL (ref 0.00–0.07)
Basophils Absolute: 0 10*3/uL (ref 0.0–0.1)
Basophils Relative: 0 %
Eosinophils Absolute: 0.1 10*3/uL (ref 0.0–0.5)
Eosinophils Relative: 1 %
HCT: 36.2 % (ref 36.0–46.0)
Hemoglobin: 13.4 g/dL (ref 12.0–15.0)
Immature Granulocytes: 0 %
Lymphocytes Relative: 55 %
Lymphs Abs: 2.5 10*3/uL (ref 0.7–4.0)
MCH: 30.5 pg (ref 26.0–34.0)
MCHC: 37 g/dL — ABNORMAL HIGH (ref 30.0–36.0)
MCV: 82.5 fL (ref 80.0–100.0)
Monocytes Absolute: 0.4 10*3/uL (ref 0.1–1.0)
Monocytes Relative: 8 %
Neutro Abs: 1.6 10*3/uL — ABNORMAL LOW (ref 1.7–7.7)
Neutrophils Relative %: 36 %
Platelets: 160 10*3/uL (ref 150–400)
RBC: 4.39 MIL/uL (ref 3.87–5.11)
RDW: 15.2 % (ref 11.5–15.5)
WBC: 4.6 10*3/uL (ref 4.0–10.5)
nRBC: 0 % (ref 0.0–0.2)

## 2021-02-19 LAB — BRAIN NATRIURETIC PEPTIDE: B Natriuretic Peptide: 10.6 pg/mL (ref 0.0–100.0)

## 2021-02-19 LAB — TROPONIN I (HIGH SENSITIVITY)
Troponin I (High Sensitivity): 2 ng/L (ref <18)
Troponin I (High Sensitivity): <2 ng/L (ref <18)

## 2021-02-19 LAB — LIPASE, BLOOD: Lipase: 39 U/L (ref 11–51)

## 2021-02-19 NOTE — ED Provider Notes (Signed)
Emergency Medicine Provider Triage Evaluation Note  [name] , a 70 y.o. female  was evaluated in triage.  Pt complains of intermittent sharp mid central chest pain that radiates to her back.  Patient has a history of SVT, hyperlipidemia and hypothyroidism.  She denies current chest tightness or shortness of breath.  Denies daily smoking, prolonged immobilization, recent surgery or cough.  She has been afebrile at home.  Review of Systems  Positive: Patient has mid-central chest pain that radiates to the back.  Negative: No shortness of breath, chest tightness or chest pain.  Physical Exam  LMP 11/26/2000  Gen:   Awake, no distress   Resp:  Normal effort  MSK:   Moves extremities without difficulty  Other:    Medical Decision Making  Medically screening exam initiated at 4:25 PM.  Appropriate orders placed.  [name] was informed that the remainder of the evaluation will be completed by another provider, this initial triage assessment does not replace that evaluation, and the importance of remaining in the ED until their evaluation is complete.     [name] [place], PA-C 02/19/21 1627    Duffy Bruce, MD 02/20/21 [phone]

## 2021-02-19 NOTE — ED Triage Notes (Signed)
Pt states that she has been having pain in her chest in the center of her chest, states that her breast are tender also, pt has pain when she turns at her waist and she can feel the pain radiate through to her back, denies any recent travel, or hormone therapy, states that her only cardiac hx is svt

## 2021-02-20 ENCOUNTER — Emergency Department
Admission: EM | Admit: 2021-02-20 | Discharge: 2021-02-20 | Disposition: A | Discharge status: Home / Self Care | Payer: Medicare Other | Attending: Emergency Medicine | Admitting: Emergency Medicine

## 2021-02-20 DIAGNOSIS — R079 Chest pain, unspecified: Secondary | ICD-10-CM

## 2021-02-20 NOTE — ED Provider Notes (Signed)
[facility] Emergency Department Provider Note  Time seen: 7:57 AM  I have reviewed the triage vital signs and the nursing notes.   HISTORY  Chief Complaint Chest Pain, Breast Pain, and Back Pain   HPI [name] is a 70 y.o. female with a past medical history of arthritis, hyperlipidemia, SVT, presents to the emergency department for right chest discomfort.  According to the patient for the past 2 days or so she has been experiencing pain in her right chest, states it is more when she turns to the right or with palpation of the right breast.  Patient denies any shortness of breath nausea or diaphoresis.  Patient's only past cardiac history is SVT, denies any palpitations.  Patient states minimal discomfort currently unless she turns to the right or presses on the chest.   Past Medical History:  Diagnosis Date   Arthritis    Neck   CIN I (cervical intraepithelial neoplasia I) 4196   Complication of anesthesia    delayed SVT:  can occur from a few hours to 3 days after the general anesthesia or conscious sedation as happened with her colonoscopy.    Dysplasia of cervix, low grade (CIN 1)    LEEP cervical conization September 2017 margins negative CIN-1   GERD (gastroesophageal reflux disease)    Hashimoto's thyroiditis    History of carpal tunnel syndrome    Left   History of colon polyps 2010   Benign   Hyperlipidemia    Hypothyroidism    Hashimoto's per pt    Osteopenia 10/2017   T score -1.9 FRAX 8.8% / 0.7%.  Stable from prior DEXA.   SVT (supraventricular tachycardia) (HCC)    Wears glasses     Patient Active Problem List   Diagnosis Date Noted   Statin myopathy 09/07/2019   Carpal tunnel syndrome of left wrist 03/25/2019   Abnormal CT of the abdomen 02/25/2018   Abdominal pain, right upper quadrant 02/25/2018   Abnormal finding on radiology exam 02/25/2018   Allergic rhinitis 02/25/2018   Brash 02/25/2018   CN (constipation)  02/25/2018   LBP (low back pain) 02/25/2018   Combined form of age-related cataract, both eyes 04/07/2017   Posterior vitreous detachment of right eye 04/07/2017   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   CIN I (cervical intraepithelial neoplasia I) 01/02/2016   Preoperative cardiovascular examination 11/10/2015   ASCUS with positive high risk HPV cervical 12/13/2014   Eyelid retraction 12/02/2014   Dry eye syndrome of bilateral lacrimal glands 12/01/2014   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   Other disorders affecting eyelid function 12/01/2014   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   Peripheral visual field defect of both eyes 12/01/2014   Age-related nuclear cataract of both eyes 09/06/2014   Anatomical narrow angle of both eyes 09/06/2014   Hx of Hashimoto thyroiditis 09/06/2014   Keratoconjunctivitis sicca of both eyes not specified as Sjogren's 09/06/2014   Meibomian gland dysfunction (MGD) of upper and lower lids of both eyes 09/06/2014   Abnormal Papanicolaou smear of cervix with positive human papilloma virus (HPV) test 06/27/2014   Hypokalemia 03/04/2014   History of paroxysmal supraventricular tachycardia 02/25/2014   Osteopenia 04/30/2013   Dysphonia 04/05/2013   Laryngopharyngeal reflux 04/05/2013   Hashimoto's thyroiditis 03/29/2013   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   Hypercalcemia 05/25/2012   Hypothyroidism 04/27/2012   Hyperlipidemia 04/27/2012   Paroxysmal supraventricular tachycardia ([place]) 04/27/2012   Tachycardia 01/22/2012   Endometrial polyp  12/04/2010   Fibroid, uterus 11/27/2010   Acquired hypothyroidism 01/17/2010   Other and unspecified hyperlipidemia 02/01/2008   Other specified cardiac dysrhythmias(427.89) 12/07/2007    Past Surgical History:  Procedure Laterality Date   BREAST SURGERY  1976   CYST REMOVED   CARDIAC ELECTROPHYSIOLOGY STUDY AND ABLATION  2001   CESAREAN SECTION  1987   COLONOSCOPY      COLONOSCOPY WITH PROPOFOL N/A 02/12/2019   Procedure: COLONOSCOPY WITH PROPOFOL;  Surgeon: Wonda Horner, MD;  Location: [facility];  Service: Endoscopy;  Laterality: N/A;   LEEP  2017   left thumb surgery and Carpal Tunnel Release  9/14   lower lid surgery   09/2018   dry eye from Hashimoto's   POLYPECTOMY  02/12/2019   Procedure: POLYPECTOMY;  Surgeon: Wonda Horner, MD;  Location: [facility];  Service: Endoscopy;;    Prior to Admission medications   Medication Sig Start Date End Date Taking? Authorizing Provider  Cholecalciferol (VITAMIN D-3) 5000 units TABS Take 5,000 mg by mouth See admin instructions. Monday and Friday    [provider]  ezetimibe (ZETIA) 10 MG tablet Take 1 tablet (10 mg total) by mouth daily. 08/28/20   Freada Bergeron, MD  fluticasone (FLONASE) 50 MCG/ACT nasal spray Place 2 sprays into both nostrils daily. 11/19/20   Scot Jun, FNP  HYDROcodone-acetaminophen [facility]) 10-325 MG tablet Take by mouth. 01/17/21   [provider]  levothyroxine (SYNTHROID) 88 MCG tablet Take 88 mcg by mouth daily. 03/15/20   [provider]  metoprolol succinate (TOPROL-XL) 50 MG 24 hr tablet Take 1 tablet (50 mg total) by mouth daily. Take with or immediately following a meal. 10/09/20   Camnitz, Ocie Doyne, MD  metoprolol tartrate (LOPRESSOR) 25 MG tablet Take 1 tablet (25 mg total) by mouth 3 (three) times daily as needed (for recurrent  palpitations/SVT). 10/09/20   Camnitz, Ocie Doyne, MD  Multiple Vitamin (MULTIVITAMIN WITH MINERALS) TABS tablet Take 1 tablet by mouth daily.    [provider]  polyethylene glycol (MIRALAX / GLYCOLAX) packet Take 17 g by mouth daily as needed for mild constipation.     [provider]  pravastatin (PRAVACHOL) 20 MG tablet Take 1 tablet (20 mg total) by mouth every evening. 12/05/20   Freada Bergeron, MD  Probiotic Product (PROBIOTIC ADVANCED PO) Take 1 capsule by mouth daily.      [provider]  tinidazole (TINDAMAX) 500 MG tablet Take 2 tablet by mouth twice daily for 2 days. 02/09/21   Princess Bruins, MD  XIIDRA 5 % SOLN Apply 1 drop to eye 2 (two) times daily. 12/30/20   [provider]    Allergies  Allergen Reactions   Lipitor [Atorvastatin] Other (See Comments)    Muscle aches   Praluent [Alirocumab]     runny nose, sinus congestion, myalgias, UTI, weight gain   Repatha [Evolocumab]     Joint pain   Diphenhydramine Palpitations   Epinephrine Palpitations    Family History  Problem Relation Age of Onset   Hypertension Mother    Heart disease Mother    Mitral valve prolapse Mother    Glaucoma Mother    Arthritis Mother    Von Willebrand disease Mother    Stroke Mother    Rheum arthritis Mother    Neuropathy Mother    Hypertension Father    Heart disease Father    Colon polyps Father    Glaucoma Father    Arthritis Father  Stroke Father    Rheum arthritis Father    Pulmonary embolism Father    Hypertension Brother    Mitral valve prolapse Brother    Glaucoma Brother    Arthritis Brother    Arthritis Sister     Social History Social History   Tobacco Use   Smoking status: Never   Smokeless tobacco: Never  Vaping Use   Vaping Use: Never used  Substance Use Topics   Alcohol use: Not Currently   Drug use: No    Review of Systems Constitutional: Negative for fever. Cardiovascular: Right-sided chest pain, mild, intermittent. Respiratory: Negative for shortness of breath. Gastrointestinal: Negative for abdominal pain, vomiting and diarrhea. Musculoskeletal: Negative for musculoskeletal complaints Neurological: Negative for headache All other ROS negative  ____________________________________________   PHYSICAL EXAM:  VITAL SIGNS: ED Triage Vitals  Enc Vitals Group     BP 02/19/21 1624 (!) 158/88     Pulse Rate 02/19/21 1624 73     Resp 02/19/21 1624 16     Temp 02/19/21 1624 97.9 F (36.6 C)      Temp Source 02/19/21 1624 Oral     SpO2 02/19/21 1624 100 %     Weight 02/19/21 1626 172 lb (78 kg)     Height 02/19/21 1626 5\' 3"  (1.6 m)     Head Circumference --      Peak Flow --      Pain Score 02/19/21 1625 10     Pain Loc --      Pain Edu? --      Excl. in [place]? --    Constitutional: Alert and oriented. Well appearing and in no distress. Eyes: Normal exam ENT      Head: Normocephalic and atraumatic.      Mouth/Throat: Mucous membranes are moist. Cardiovascular: Normal rate, regular rhythm.  Respiratory: Normal respiratory effort without tachypnea nor retractions. Breath sounds are clear  Gastrointestinal: Soft and nontender. No distention.  Musculoskeletal: Nontender with normal range of motion in all extremities.  Neurologic:  Normal speech and language. No gross focal neurologic deficits Skin:  Skin is warm, dry and intact.  Psychiatric: Mood and affect are normal.   ____________________________________________    EKG  EKG viewed and interpreted by myself shows a normal sinus rhythm at 65 bpm with a narrow QRS, normal axis, normal intervals, occasional PVC, no concerning ST changes.  ____________________________________________    RADIOLOGY  Chest x-ray is clear  ____________________________________________   INITIAL IMPRESSION / ASSESSMENT AND PLAN / ED COURSE  Pertinent labs & imaging results that were available during my care of the patient were reviewed by me and considered in my medical decision making (see chart for details).   Patient presents to the emergency department for right-sided chest pain, worse with moving to the right or palpation.  Patient does have mild tenderness to palpation/compression of the right chest, otherwise reassuring exam.  Patient's work-up has been reassuring as well, normal troponin x2, reassuring lab work, reassuring EKG and chest x-ray.  Given the patient's reassuring findings and vitals highly suspect musculoskeletal discomfort  to be the cause of her pain.  Given her reassuring work-up I believe the patient is safe for discharge home with outpatient follow-up.  Patient agreeable to plan of care.  Provided by normal chest pain return precautions.  Brya Simerly was evaluated in Emergency Department on 02/20/2021 for the symptoms described in the history of present illness. She was evaluated in the context of the global COVID-19 pandemic, which  necessitated consideration that the patient might be at risk for infection with the SARS-CoV-2 virus that causes COVID-19. Institutional protocols and algorithms that pertain to the evaluation of patients at risk for COVID-19 are in a state of rapid change based on information released by regulatory bodies including the CDC and federal and state organizations. These policies and algorithms were followed during the patient's care in the ED.  ____________________________________________   FINAL CLINICAL IMPRESSION(S) / ED DIAGNOSES  Chest pain   Harvest Dark, MD 02/20/21 0800

## 2021-02-22 ENCOUNTER — Telehealth: Payer: Self-pay

## 2021-02-22 ENCOUNTER — Ambulatory Visit: Payer: Medicare Other | Admitting: Obstetrics & Gynecology

## 2021-02-22 ENCOUNTER — Other Ambulatory Visit: Payer: Self-pay

## 2021-02-22 ENCOUNTER — Encounter: Payer: Self-pay | Admitting: Obstetrics & Gynecology

## 2021-02-22 VITALS — BP 118/78 | Temp 98.6°F

## 2021-02-22 DIAGNOSIS — R350 Frequency of micturition: Secondary | ICD-10-CM | POA: Diagnosis not present

## 2021-02-22 DIAGNOSIS — R3915 Urgency of urination: Secondary | ICD-10-CM | POA: Diagnosis not present

## 2021-02-22 DIAGNOSIS — N898 Other specified noninflammatory disorders of vagina: Secondary | ICD-10-CM | POA: Diagnosis not present

## 2021-02-22 LAB — WET PREP FOR TRICH, YEAST, CLUE

## 2021-02-22 NOTE — Telephone Encounter (Signed)
Jessica Bruins, MD  [facility] Triage  Urgency of urine.  Refer to Urology for Urodynamic testing and management.

## 2021-02-22 NOTE — Progress Notes (Signed)
    [name] 09/07/1950 [id number]        70 y.o.  G1P1L1   RP: Vaginal discharge/Urinary urgency and frequency  HPI: C/O a persistent vaginal discharge, no odor, no itching.  Recurrence of urinary urgency and frequency.  No pelvic pain.  Abstinent.  No fever.  Had Colpo with cervical Bxs 01/17/2021.   OB History  Gravida Para Term Preterm AB Living  1 1 1     1   SAB IAB Ectopic Multiple Live Births               # Outcome Date GA Lbr Len/2nd Weight Sex Delivery Anes PTL Lv  1 Term             Past medical history,surgical history, problem list, medications, allergies, family history and social history were all reviewed and documented in the EPIC chart.   Directed ROS with pertinent positives and negatives documented in the history of present illness/assessment and plan.  Exam:  Vitals:   02/22/21 1424  BP: 118/78  Temp: 98.6 F (37 C)  TempSrc: Oral   General appearance:  Normal  Gynecologic exam: Vulva normal.  Speculum:  Cervix not completely healed from Cervical Bxs at [facility] 01/17/2021.  Gono-Chlam done. Mild vaginal discharge.  Wet prep done.   U/A: Yellow clear, Pro Neg, Nit Neg, WBC 0-5, RBC 3-10, Bacteria Neg.  U. Culture pending.  Wet prep: Negative   Assessment/Plan:  70 y.o. G1P1001   1. Urinary frequency U/A minimally perturbed, probably no Cystitis.  U. Culture pending.  Reassured. - Urinalysis,Complete w/RFL Culture  2. Urgency of urination Worsening urinary urgency.  Not drinking excessive coffee, tea or eating chocolate. Refer to Urology for Urodynamic testing and management.  3. Vaginal discharge Wet prep Negative, reassured.  Discharge probably associated with not fully healed cervix from Bxs 01/17/2021.  F/U in 2 months to confirm full healing of the cervix. - WET PREP FOR TRICH, YEAST, CLUE - C. trachomatis/N. gonorrhoeae RNA  Other orders - doxycycline (MONODOX) 50 MG capsule; Take 50 mg by mouth 2 (two) times daily. -  tretinoin (RETIN-A) 0.025 % cream; PLEASE SEE ATTACHED FOR DETAILED DIRECTIONS   Princess Bruins MD, 2:42 PM 02/22/2021

## 2021-02-23 LAB — C. TRACHOMATIS/N. GONORRHOEAE RNA
C. trachomatis RNA, TMA: NOT DETECTED
N. gonorrhoeae RNA, TMA: NOT DETECTED

## 2021-02-24 LAB — URINALYSIS, COMPLETE W/RFL CULTURE
Bacteria, UA: NONE SEEN /HPF
Bilirubin Urine: NEGATIVE
Glucose, UA: NEGATIVE
Hyaline Cast: NONE SEEN /LPF
Leukocyte Esterase: NEGATIVE
Nitrites, Initial: NEGATIVE
Protein, ur: NEGATIVE
Specific Gravity, Urine: 1.02 (ref 1.001–1.035)
pH: 6.5 (ref 5.0–8.0)

## 2021-02-24 LAB — URINE CULTURE
MICRO NUMBER:: 12589635
Result:: NO GROWTH
SPECIMEN QUALITY:: ADEQUATE

## 2021-02-24 LAB — CULTURE INDICATED

## 2021-03-01 NOTE — Telephone Encounter (Signed)
Patient called back requesting another referral to different Urologist.  She said first available appt they could offer her at [facility] was 12.22.22. and she will be out of town. After that it was end of Jan. She said she prefers not to have to deal with this problem for 2 more months.  Can you recommend anyone?

## 2021-03-01 NOTE — Telephone Encounter (Signed)
Referral faxed to [facility].  I called patient and per left message that they will call her to schedule but it might be awhile. I provided her the phone number if she would like to call sooner.

## 2021-03-05 DIAGNOSIS — E785 Hyperlipidemia, unspecified: Secondary | ICD-10-CM

## 2021-03-05 DIAGNOSIS — Z79899 Other long term (current) drug therapy: Secondary | ICD-10-CM

## 2021-03-05 DIAGNOSIS — E782 Mixed hyperlipidemia: Secondary | ICD-10-CM

## 2021-03-05 NOTE — Telephone Encounter (Signed)
[facility] in [place] called. They do not do Urodynamics and send patients to [facility] to [facility].  I called [facility] and left message to inquire if they perform urodynamics and how soon  new patient referral would be if they do.

## 2021-03-05 NOTE — Telephone Encounter (Signed)
Left message with [facility] in [place] to see how far out their new patient referrals are running.

## 2021-03-05 NOTE — Telephone Encounter (Signed)
Patient opts to go ahead and see MD in [place] since they can see her in the next week or two as new patient. She does understand that they do not perform urodynamics in their office and she will be referred to [facility] for the testing and they will follow up with her on it and be her primary MD.  Referral faxed.

## 2021-03-06 NOTE — Telephone Encounter (Signed)
December 05, 2020 Supple, Harlon Flor, RPH-CPP to [place] [facility]"     12:59 PM I have sent a prescription for pravastatin 20mg  to take 1 tablet daily into your pharmacy. Please continue on the Zetia each day as well. Let me know if you have any trouble tolerating the pravastatin. Otherwise if you tolerate it well, we would want to recheck your cholesterol in another 2-3 months.   Thanks, Jinny Blossom    As referenced above and pt requested, she is due to have lipids checked now, for previous start of pravastatin 20 mg daily back on 8/16. Lipid order placed and pt is scheduled to come in for this lab on 11/21, as she requested.  She is aware to come fasting to this lab appt.  Pt agreed to plan via mychart message.

## 2021-03-07 ENCOUNTER — Telehealth: Payer: Self-pay

## 2021-03-12 ENCOUNTER — Other Ambulatory Visit: Payer: Self-pay

## 2021-03-12 ENCOUNTER — Other Ambulatory Visit: Payer: Medicare Other | Admitting: *Deleted

## 2021-03-12 DIAGNOSIS — Z79899 Other long term (current) drug therapy: Secondary | ICD-10-CM

## 2021-03-12 DIAGNOSIS — E782 Mixed hyperlipidemia: Secondary | ICD-10-CM

## 2021-03-12 DIAGNOSIS — E785 Hyperlipidemia, unspecified: Secondary | ICD-10-CM

## 2021-03-13 ENCOUNTER — Other Ambulatory Visit: Payer: Medicare Other

## 2021-03-13 LAB — HEPATIC FUNCTION PANEL
ALT: 22 IU/L (ref 0–32)
AST: 21 IU/L (ref 0–40)
Albumin: 4.7 g/dL (ref 3.8–4.8)
Alkaline Phosphatase: 91 IU/L (ref 44–121)
Bilirubin Total: 0.4 mg/dL (ref 0.0–1.2)
Bilirubin, Direct: 0.12 mg/dL (ref 0.00–0.40)
Total Protein: 6.6 g/dL (ref 6.0–8.5)

## 2021-03-13 LAB — LIPID PANEL
Chol/HDL Ratio: 3.1 ratio (ref 0.0–4.4)
Cholesterol, Total: 166 mg/dL (ref 100–199)
HDL: 54 mg/dL (ref >39)
LDL Chol Calc (NIH): 92 mg/dL (ref 0–99)
Triglycerides: 114 mg/dL (ref 0–149)
VLDL Cholesterol Cal: 20 mg/dL (ref 5–40)

## 2021-03-13 NOTE — Telephone Encounter (Signed)
I called to follow up on referral with the urology office. The referral has been removed but referral document is in the system.   The lady I spoke with said she will check with the referral coordinator and will call me back with follow up.

## 2021-03-13 NOTE — Telephone Encounter (Signed)
Opened in error

## 2021-03-13 NOTE — Progress Notes (Signed)
Cardiology Office Note:    Date:  03/23/2021   ID:  [name], DOB Jan 05, 1951, MRN [id number]  PCP:  Cari Caraway, [place]  Cardiologist:  Fransico Him, MD  Advanced Practice Provider:  No care team member to display Electrophysiologist:  Will Meredith Leeds, MD    Referring MD: Cari Caraway, MD    History of Present Illness:    [name] is a 70 y.o. female with a hx of HTN, HLD, paroxysmal SVT s/p ablation, and hypothyroidism who was previously followed by Dr. [name] who presents to for CV follow-up.  The patient has a long history of paroxysmal SVT. Underwent ablation for SVT at [facility] in 2001.  Since then she has had infrequent episodes of SVT.  Many of her episodes have occurred following surgical procedures after administration of anesthesia.  She has had an echocardiogram on 02/02/13 which showed normal left ventricular function with ejection fraction 55-60% and no significant abnormalities. Cardiac monitor 12/17/2017 showed normal sinus rhythm, sinus bradycardia, sinus tachycardia with some nonsustained atrial tachycardia up to 13 beats and occasional PVCs with PVC burden of 1%.  Heart rate ranged from 42 to 185 bpm. Has been followed by Dr. Curt Bears with last visit in 2020 where she was doing well and was continued on her metoprolol.  During visit on 06/19/20, the patient was having elevated blood pressures running 130s-170s. Also had episode of SVT in 04/2020. Following our visit on 06/23/20, she called the on-call line due to HR 142. She was instructed to take additional doses of her metop at that time. She was seen in EP clinic on 06/30/20 where she was recommended for possible ablation if symptoms recurred.  Last visit on 09/21/20, the patient had another episode of SVT after a dental procedure.She went to [facility] where HR were mildly elevated at that time. Received IVF and toradol with resolution of  symptoms and was discharged home. Ca score was also obtained on 10/31/20 which was 0.   Today, the patient feels overall well from a CV perspective. No recurrence of SVT since last appointment. No chest pain, SOB, lightheadedness, syncope, orthopnea or PND. No LE edema. Has been suffering from joint pain and back pain; she is hoping to avoid surgery and is interested to see a Sports Medicine doctor.  Remains active and does silver sneakers 4-5times per week.   Also has had some tightness in her hands and saw her Rheum who is checking to see if underlying rheumatologic etiology.  Past Medical History:  Diagnosis Date   Arthritis    Neck   CIN I (cervical intraepithelial neoplasia I) 1696   Complication of anesthesia    delayed SVT:  can occur from a few hours to 3 days after the general anesthesia or conscious sedation as happened with her colonoscopy.    Dysplasia of cervix, low grade (CIN 1)    LEEP cervical conization September 2017 margins negative CIN-1   GERD (gastroesophageal reflux disease)    Hashimoto's thyroiditis    History of carpal tunnel syndrome    Left   History of colon polyps 2010   Benign   Hyperlipidemia    Hypothyroidism    Hashimoto's per pt    Osteopenia 10/2017   T score -1.9 FRAX 8.8% / 0.7%.  Stable from prior DEXA.   SVT (supraventricular tachycardia) (HCC)    Wears glasses     Past Surgical History:  Procedure Laterality Date   BREAST  SURGERY  1976   CYST REMOVED   CARDIAC ELECTROPHYSIOLOGY STUDY AND ABLATION  2001   CESAREAN SECTION  1987   COLONOSCOPY     COLONOSCOPY WITH PROPOFOL N/A 02/12/2019   Procedure: COLONOSCOPY WITH PROPOFOL;  Surgeon: Wonda Horner, MD;  Location: [facility] ENDOSCOPY;  Service: Endoscopy;  Laterality: N/A;   LEEP  2017   left thumb surgery and Carpal Tunnel Release  9/14   lower lid surgery   09/2018   dry eye from Hashimoto's   POLYPECTOMY  02/12/2019   Procedure: POLYPECTOMY;  Surgeon: Wonda Horner, MD;  Location: [facility]  ENDOSCOPY;  Service: Endoscopy;;    Current Medications: Current Meds  Medication Sig   Cholecalciferol (VITAMIN D-3) 5000 units TABS Take 5,000 mg by mouth See admin instructions. Monday and Friday   ezetimibe (ZETIA) 10 MG tablet Take 10 mg by mouth daily.   fluticasone (FLONASE) 50 MCG/ACT nasal spray Place 2 sprays into both nostrils daily.   levothyroxine (SYNTHROID) 88 MCG tablet Take 88 mcg by mouth daily.   metoprolol succinate (TOPROL-XL) 50 MG 24 hr tablet Take 1 tablet (50 mg total) by mouth daily. Take with or immediately following a meal.   metoprolol tartrate (LOPRESSOR) 25 MG tablet Take 1 tablet (25 mg total) by mouth 3 (three) times daily as needed (for recurrent  palpitations/SVT).   Multiple Vitamin (MULTIVITAMIN WITH MINERALS) TABS tablet Take 1 tablet by mouth daily.   polyethylene glycol (MIRALAX / GLYCOLAX) packet Take 17 g by mouth daily as needed for mild constipation.    Polyvinyl Alcohol-Povidone PF (REFRESH) 1.4-0.6 % SOLN Apply 1 drop to eye daily as needed (for dry eyes).   pravastatin (PRAVACHOL) 20 MG tablet Take 1 tablet (20 mg total) by mouth every evening.   Probiotic Product (PROBIOTIC ADVANCED PO) Take 1 capsule by mouth daily.    tretinoin (RETIN-A) 0.025 % cream PLEASE SEE ATTACHED FOR DETAILED DIRECTIONS   Varenicline Tartrate (TYRVAYA) 0.03 MG/ACT SOLN Place 1 spray into the nose daily.   XIIDRA 5 % SOLN Apply 1 drop to eye 2 (two) times daily.     Allergies:   Diphenhydramine, Epinephrine, Lipitor [atorvastatin], Praluent [alirocumab], and Repatha [evolocumab]   Social History   Socioeconomic History   Marital status: Widowed    Spouse name: Not on file   Number of children: 1   Years of education: Not on file   Highest education level: Master's degree (e.g., MA, MS, MEng, MEd, MSW, MBA)  Occupational History   Not on file  Tobacco Use   Smoking status: Never   Smokeless tobacco: Never  Vaping Use   Vaping Use: Never used  Substance and  Sexual Activity   Alcohol use: Not Currently   Drug use: No   Sexual activity: Not Currently    Partners: Male    Birth control/protection: Post-menopausal    Comment: 70 YEARS OLD, NO MORE THAN 5 PARTNERS ,des neg  Other Topics Concern   Not on file  Social History Narrative   Her daughter lives at home with her    Right handed   Caffeine: coffee, soda, (about 1 cup/day)   Social Determinants of Health   Financial Resource Strain: Not on file  Food Insecurity: Not on file  Transportation Needs: Not on file  Physical Activity: Not on file  Stress: Not on file  Social Connections: Not on file     Family History: The patient's family history includes Arthritis in her brother, father, mother, and sister;  Colon polyps in her father; Glaucoma in her brother, father, and mother; Heart disease in her father and mother; Hypertension in her brother, father, and mother; Mitral valve prolapse in her brother and mother; Neuropathy in her mother; Pulmonary embolism in her father; Rheum arthritis in her father and mother; Stroke in her father and mother; Von Willebrand disease in her mother.  ROS:   Please see the history of present illness.    Review of Systems  Constitutional:  Negative for chills and fever.  HENT:  Negative for hearing loss.   Eyes:  Negative for blurred vision.  Respiratory:  Negative for shortness of breath.   Cardiovascular:  Negative for chest pain, palpitations, orthopnea, claudication, leg swelling and PND.  Gastrointestinal:  Negative for nausea and vomiting.  Genitourinary:  Negative for dysuria.  Musculoskeletal:  Positive for back pain, joint pain, myalgias and neck pain. Negative for falls.  Neurological:  Positive for dizziness. Negative for loss of consciousness.  Endo/Heme/Allergies:  Negative for polydipsia.  Psychiatric/Behavioral:  Negative for substance abuse.    EKGs/Labs/Other Studies Reviewed:    The following studies were reviewed today: Ca  score 10/31/20: FINDINGS: Coronary Calcium Score:   Left main: 0   Left anterior descending artery: 0   Left circumflex artery: 0   Right coronary artery: 0   Total: 0   Percentile: 0   Pericardium: Normal.   Ascending Aorta: Normal caliber.  Mild aortic atherosclerosis.   Non-cardiac: See separate report from [facility].   IMPRESSION: Coronary calcium score of 0. This was 0 percentile for age-, race-, and sex-matched controls.   Mild aortic atherosclerosis.  Cardiac monitor 01/26/18 - personally reviewed Normal sinus rhythm, sinus bradycardia and sinus tachycardia with average heart rate 73 bpm. Heart rate ranged from 42 to 185 bpm. Occasional PVCs with PVC burden 1%. Nonsustained atrial tachycardia up to 13 beats.  TTE 2014: ------------------------------------------------------------  Left ventricle:  The cavity size was normal. Systolic  function was normal. The estimated ejection fraction was in  the range of 55% to 60%. Wall motion was normal; there were  no regional wall motion abnormalities.   ------------------------------------------------------------  Aortic valve:   Trileaflet; normal thickness leaflets.  Mobility was not restricted.  Doppler:  Transvalvular  velocity was within the normal range. There was no stenosis.   No regurgitation.   ------------------------------------------------------------  Aorta:  Aortic root: The aortic root was normal in size.   ------------------------------------------------------------  Mitral valve:   Structurally normal valve.   Mobility was  not restricted.  Doppler:  Transvalvular velocity was within  the normal range. There was no evidence for stenosis.  Trivial regurgitation.    Peak gradient: 25mm Hg (D).   ------------------------------------------------------------  Left atrium:  The atrium was normal in size.   ------------------------------------------------------------  Right ventricle:  The  cavity size was normal. Wall thickness  was normal. Systolic function was normal.   ------------------------------------------------------------  Pulmonic valve:    Doppler:  Transvalvular velocity was  within the normal range. There was no evidence for stenosis.     ------------------------------------------------------------  Tricuspid valve:   Structurally normal valve.    Doppler:  Transvalvular velocity was within the normal range.  Trivial  regurgitation.   ------------------------------------------------------------  Pulmonary artery:   The main pulmonary artery was  normal-sized. Systolic pressure was within the normal range.     ------------------------------------------------------------  Right atrium:  The atrium was normal in size.   ------------------------------------------------------------  Pericardium:  There was no pericardial effusion.   ------------------------------------------------------------  Systemic veins:  Inferior vena cava: The vessel was normal in size.    EKG:  EKG 05/08/20: NSR   Recent Labs: 02/19/2021: B Natriuretic Peptide 10.6; BUN 16; Creatinine, Ser 0.75; Hemoglobin 13.4; Platelets 160; Potassium 3.8; Sodium 139 03/12/2021: ALT 22  Recent Lipid Panel    Component Value Date/Time   CHOL 166 03/12/2021 1009   TRIG 114 03/12/2021 1009   HDL 54 03/12/2021 1009   CHOLHDL 3.1 03/12/2021 1009   CHOLHDL 3.4 12/27/2014 1055   VLDL 26 12/27/2014 1055   LDLCALC 92 03/12/2021 1009     Physical Exam:    VS:  BP 116/72   Pulse 62   Ht 5\' 3"  (1.6 m)   Wt 170 lb 12.8 oz (77.5 kg)   LMP 11/26/2000   SpO2 93%   BMI 30.26 kg/m     Wt Readings from Last 3 Encounters:  03/23/21 170 lb 12.8 oz (77.5 kg)  03/21/21 170 lb (77.1 kg)  02/19/21 172 lb (78 kg)     GEN:  Comfortable, NAD HEENT: Normal NECK: No JVD; No carotid bruits CARDIAC: RRR, Soft systolic murmur at RUSB, no rubs or gallops RESPIRATORY:  Clear to auscultation without  rales, wheezing or rhonchi  ABDOMEN: Soft, non-tender, non-distended MUSCULOSKELETAL:  No edema; No deformity  SKIN: Warm and dry NEUROLOGIC:  Alert and oriented x 3 PSYCHIATRIC:  Normal affect   ASSESSMENT:    1. SVT (supraventricular tachycardia) (HCC)   2. Back pain, unspecified back location, unspecified back pain laterality, unspecified chronicity   3. Arthralgia, unspecified joint   4. Mixed hyperlipidemia   5. Statin myopathy   6. Primary hypertension    PLAN:    In order of problems listed above:  #Paroxysmal SVT s/p Ablation: Has long history of SVT s/p ablation at [facility] in 2001. TTE in 2014 with normal LVEF, no significant valvular disease. Had several recurrences mainly after surgical procedures and administration of anesthesia. Had recurrence of SVT in 04/2020 and 06/2020. Possible plan for repeat ablation if symptoms continue to recur. -Follow-up with Dr. Curt Bears as scheduled -Continue metop 50mg  XL with prn metop tartrate 25mg  as needed -Discussed importance of maintaining hydration and avoiding excessive caffeine  #HTN: Well controlled and at goal <120s/80s -Continue metop 50mg  XL as above -Off losartan due to low blood pressures on the medication  #HLD: #Statin Intolerance: Unable to tolerate statins due to myalgias and repatha stopped due to weight gain. Currently on zetia 10mg  daily with LDL 92 in 02/2021. Ca score 10/2020 0.  -Continue zetia10mg  daily -Ca score 0 -LDL goal <100  #Back/Neck Pain: Patient with chronic back/neck pain hoping to avoid surgical intervention. Interested in seeing Sports Medicine physician. -Refer to Charlann Boxer  Medication Adjustments/Labs and Tests Ordered: Current medicines are reviewed at length with the patient today.  Concerns regarding medicines are outlined above.  Orders Placed This Encounter  Procedures   AMB referral to sports medicine   No orders of the defined types were placed in this encounter.   Patient  Instructions  Medication Instructions:   Your physician recommends that you continue on your current medications as directed. Please refer to the Current Medication list given to you today.  *If you need a refill on your cardiac medications before your next appointment, please call your pharmacy*   You have been referred to [place]: At [facility], you and your health needs are  our priority.  As part of our continuing mission to provide you with exceptional heart care, we have created designated Provider Care Teams.  These Care Teams include your primary Cardiologist (physician) and Advanced Practice Providers (APPs -  Physician Assistants and Nurse Practitioners) who all work together to provide you with the care you need, when you need it.  We recommend signing up for the patient portal called "MyChart".  Sign up information is provided on this After Visit Summary.  MyChart is used to connect with patients for Virtual Visits (Telemedicine).  Patients are able to view lab/test results, encounter notes, upcoming appointments, etc.  Non-urgent messages can be sent to your provider as well.   To learn more about what you can do with MyChart, go to [email or web address].    Your next appointment:   6 month(s)  The format for your next appointment:   In Person  Provider:    DR. Johney Frame    Signed, Freada Bergeron, MD  03/23/2021 10:19 AM    [place]

## 2021-03-21 ENCOUNTER — Other Ambulatory Visit: Payer: Self-pay

## 2021-03-21 ENCOUNTER — Encounter: Payer: Self-pay | Admitting: Urology

## 2021-03-21 ENCOUNTER — Ambulatory Visit: Payer: Medicare Other | Admitting: Urology

## 2021-03-21 VITALS — BP 117/76 | HR 69 | Ht 62.5 in | Wt 170.0 lb

## 2021-03-21 DIAGNOSIS — R3915 Urgency of urination: Secondary | ICD-10-CM

## 2021-03-21 DIAGNOSIS — R3121 Asymptomatic microscopic hematuria: Secondary | ICD-10-CM

## 2021-03-21 LAB — MICROSCOPIC EXAMINATION: Bacteria, UA: NONE SEEN

## 2021-03-21 LAB — URINALYSIS, COMPLETE
Bilirubin, UA: NEGATIVE
Glucose, UA: NEGATIVE
Ketones, UA: NEGATIVE
Leukocytes,UA: NEGATIVE
Nitrite, UA: NEGATIVE
Protein,UA: NEGATIVE
Specific Gravity, UA: 1.02 (ref 1.005–1.030)
Urobilinogen, Ur: 0.2 mg/dL (ref 0.2–1.0)
pH, UA: 5.5 (ref 5.0–7.5)

## 2021-03-21 LAB — BLADDER SCAN AMB NON-IMAGING

## 2021-03-21 NOTE — Progress Notes (Signed)
03/21/21 9:12 AM   [name] 05/31/1950 [id number]  CC: Urinary frequency, microscopic hematuria  HPI: I saw [name] for the above issues today.  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  She really does not have any frequency and is only voiding 2-3 times per day, and 0 times overnight.  She was having some urge incontinence, but that has resolved.  This did correlate with a recent cervical biopsy.  She had similar symptoms after a biopsy previously, but those resolved after about 6 weeks.  She drinks some coffee and soda during the day.  She denies any gross hematuria.  Recent urinalysis with PCP notable for 3-10 RBCs, and culture was negative.  Recent pelvic exam with OB/GYN a few weeks ago showed ongoing healing of the cervical biopsy but no other abnormalities.    She denies any smoking history.  She would like to avoid medications if possible.  Urinalysis today completely benign, and PVR normal at 34 mL.  PMH: Past Medical History:  Diagnosis Date   Arthritis    Neck   CIN I (cervical intraepithelial neoplasia I) 2297   Complication of anesthesia    delayed SVT:  can occur from a few hours to 3 days after the general anesthesia or conscious sedation as happened with her colonoscopy.    Dysplasia of cervix, low grade (CIN 1)    LEEP cervical conization September 2017 margins negative CIN-1   GERD (gastroesophageal reflux disease)    Hashimoto's thyroiditis    History of carpal tunnel syndrome    Left   History of colon polyps 2010   Benign   Hyperlipidemia    Hypothyroidism    Hashimoto's per pt    Osteopenia 10/2017   T score -1.9 FRAX 8.8% / 0.7%.  Stable from prior DEXA.   SVT (supraventricular tachycardia) (HCC)    Wears glasses     Surgical History: Past Surgical History:  Procedure Laterality Date   BREAST SURGERY  1976   CYST REMOVED   CARDIAC ELECTROPHYSIOLOGY  STUDY AND ABLATION  2001   CESAREAN SECTION  1987   COLONOSCOPY     COLONOSCOPY WITH PROPOFOL N/A 02/12/2019   Procedure: COLONOSCOPY WITH PROPOFOL;  Surgeon: Wonda Horner, MD;  Location: [facility];  Service: Endoscopy;  Laterality: N/A;   LEEP  2017   left thumb surgery and Carpal Tunnel Release  9/14   lower lid surgery   09/2018   dry eye from Hashimoto's   POLYPECTOMY  02/12/2019   Procedure: POLYPECTOMY;  Surgeon: Wonda Horner, MD;  Location: [facility];  Service: Endoscopy;;    Family History: Family History  Problem Relation Age of Onset   Hypertension Mother    Heart disease Mother    Mitral valve prolapse Mother    Glaucoma Mother    Arthritis Mother    Von Willebrand disease Mother    Stroke Mother    Rheum arthritis Mother    Neuropathy Mother    Hypertension Father    Heart disease Father    Colon polyps Father    Glaucoma Father    Arthritis Father    Stroke Father    Rheum arthritis Father    Pulmonary embolism Father    Hypertension Brother    Mitral valve prolapse Brother    Glaucoma Brother    Arthritis Brother    Arthritis Sister     Social History:  reports that she has never smoked. She has never used smokeless tobacco. She reports that she does not currently use alcohol. She reports that she does not use drugs.  Physical Exam: LMP 11/26/2000    Constitutional:  Alert and oriented, No acute distress. Cardiovascular: No clubbing, cyanosis, or edema. Respiratory: Normal respiratory effort, no increased work of breathing. GI: Abdomen is soft, nontender, nondistended, no abdominal masses   Laboratory Data: Reviewed, see HPI  Pertinent Imaging: I have personally viewed and interpreted the CT abdomen and pelvis with contrast dated 10/15/2019 showing some mild bladder wall thickening that is nonspecific, no hydronephrosis or other urologic abnormalities.  Assessment & Plan:   70 year old female with single episode of microscopic hematuria  with 3-10 RBCs after a cervical biopsy, now resolved on urinalysis today, as well as urinary urgency during the day.  She really does not have significant frequency and is only voiding 2-3 times per day.  She would like to avoid medications, and I recommended starting with timed voiding every 2-3 hours during the day, and avoiding bladder irritants.  We reviewed the AUA guidelines regarding work-up of microscopic hematuria, and she would like to defer further evaluation with cystoscopy and CT with the negative urinalysis today which I think is reasonable.  Return precautions discussed at length.  I also gave her samples of 2 weeks of Myrbetriq if she does not have improvement in her symptoms with a timed voiding over the next few weeks.    She would like to follow-up as needed, but I am happy to see her again in the future if she does not have improvement with the above strategies   Nickolas Madrid, MD 03/21/2021  [facility] [address], [place], [place] [address] [phone]

## 2021-03-23 ENCOUNTER — Ambulatory Visit: Payer: Medicare Other | Admitting: Cardiology

## 2021-03-23 ENCOUNTER — Other Ambulatory Visit: Payer: Self-pay

## 2021-03-23 ENCOUNTER — Encounter: Payer: Self-pay | Admitting: Cardiology

## 2021-03-23 VITALS — BP 116/72 | HR 62 | Ht 63.0 in | Wt 170.8 lb

## 2021-03-23 DIAGNOSIS — M549 Dorsalgia, unspecified: Secondary | ICD-10-CM

## 2021-03-23 DIAGNOSIS — I471 Supraventricular tachycardia: Secondary | ICD-10-CM | POA: Diagnosis not present

## 2021-03-23 DIAGNOSIS — M2550 Pain in unspecified joint: Secondary | ICD-10-CM | POA: Diagnosis not present

## 2021-03-23 DIAGNOSIS — G720 Drug-induced myopathy: Secondary | ICD-10-CM

## 2021-03-23 DIAGNOSIS — T466X5D Adverse effect of antihyperlipidemic and antiarteriosclerotic drugs, subsequent encounter: Secondary | ICD-10-CM

## 2021-03-23 DIAGNOSIS — I10 Essential (primary) hypertension: Secondary | ICD-10-CM

## 2021-03-23 DIAGNOSIS — E782 Mixed hyperlipidemia: Secondary | ICD-10-CM

## 2021-03-23 NOTE — Patient Instructions (Signed)
Medication Instructions:   Your physician recommends that you continue on your current medications as directed. Please refer to the Current Medication list given to you today.  *If you need a refill on your cardiac medications before your next appointment, please call your pharmacy*   You have been referred to [place]: At [facility], you and your health needs are our priority.  As part of our continuing mission to provide you with exceptional heart care, we have created designated Provider Care Teams.  These Care Teams include your primary Cardiologist (physician) and Advanced Practice Providers (APPs -  Physician Assistants and Nurse Practitioners) who all work together to provide you with the care you need, when you need it.  We recommend signing up for the patient portal called "MyChart".  Sign up information is provided on this After Visit Summary.  MyChart is used to connect with patients for Virtual Visits (Telemedicine).  Patients are able to view lab/test results, encounter notes, upcoming appointments, etc.  Non-urgent messages can be sent to your provider as well.   To learn more about what you can do with MyChart, go to [email or web address].    Your next appointment:   6 month(s)  The format for your next appointment:   In Person  Provider:    DR. Johney Frame

## 2021-03-29 NOTE — Progress Notes (Signed)
[name] [place] Phone: [phone]   Assessment and Plan:     1. Polyarthralgia 2. DDD (degenerative disc disease), lumbar 3. Somatic dysfunction of cervical region 4. Somatic dysfunction of thoracic region 5. Somatic dysfunction of lumbar region 6. Somatic dysfunction of pelvic region 7. Somatic dysfunction of rib region -Chronic with exacerbation, initial sports medicine visit - Multiple musculoskeletal complaints off and on for years with acute flare primarily in low back and upper thoracic regions.  Likely acute flares of DDD throughout spine based on HPI, physical exam, lumbar MRI 2021 - No red flag symptoms at today's visit, so no additional imaging taken - Start meloxicam 15 mg daily x2 weeks and then use as needed after 2 weeks afterwards - Use Tylenol for breakthrough pain - Start HEP for low back, neck - Due to multiple, intermittent musculoskeletal complaints and patient's history of Hashimoto's, we will do an autoimmune lab work-up at today's visit - Patient has received significant relief with OMT in the past.  Elects for repeat OMT today.  Tolerated well per note below. - Decision today to treat with OMT was based on Physical Exam  After verbal consent patient was treated with HVLA (high velocity low amplitude), ME (muscle energy), FPR (flex positional release), ST (soft tissue), PC/PD (Pelvic Compression/ Pelvic Decompression) techniques in cervical, rib, thoracic, lumbar, and pelvic areas. Patient tolerated the procedure well with improvement in symptoms.  Patient educated on potential side effects of soreness and recommended to rest, hydrate, and use Tylenol as needed for pain control.  Pertinent previous records reviewed include lumbar MRI 2021   Follow Up: 3 to 4 weeks for reevaluation.  Could consider repeat OMT at that time versus referral for formal PT    Subjective:    I,  [name], am serving as a Education administrator for [name]  Chief Complaint: back and neck pain   HPI:   03/23/21 Patient with chronic back/neck pain hoping to avoid surgical intervention.  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   Current Outpatient Medications:    Cholecalciferol (VITAMIN D-3) 5000 units TABS, Take 5,000 mg by mouth See admin instructions. Monday and Friday, Disp: , Rfl:    ezetimibe (ZETIA) 10 MG tablet, Take 10 mg by mouth daily., Disp: , Rfl:    fluticasone (FLONASE) 50 MCG/ACT nasal spray, Place 2 sprays into both nostrils daily., Disp: 16 g, Rfl: 0   levothyroxine (SYNTHROID) 88 MCG tablet, Take 88 mcg by mouth daily., Disp: , Rfl:    metoprolol succinate (TOPROL-XL) 50 MG 24 hr tablet, Take 1 tablet (50 mg total) by mouth daily. Take with or immediately following a meal., Disp: 90 tablet, Rfl: 3   metoprolol tartrate (LOPRESSOR) 25 MG tablet, Take 1 tablet (25 mg total) by mouth 3 (three) times daily as needed (for recurrent  palpitations/SVT)., Disp: 90 tablet, Rfl: 3   Multiple Vitamin (MULTIVITAMIN WITH MINERALS) TABS tablet, Take 1 tablet by mouth daily., Disp: , Rfl:    polyethylene glycol (MIRALAX / GLYCOLAX) packet, Take 17 g  by mouth daily as needed for mild constipation. , Disp: , Rfl:    Polyvinyl Alcohol-Povidone PF (REFRESH) 1.4-0.6 % SOLN, Apply 1 drop to eye daily as needed (for dry eyes)., Disp: , Rfl:    pravastatin (PRAVACHOL) 20 MG tablet, Take 1 tablet (20 mg total) by mouth every evening., Disp: 30 tablet,  Rfl: 11   Probiotic Product (PROBIOTIC ADVANCED PO), Take 1 capsule by mouth daily. , Disp: , Rfl:    tretinoin (RETIN-A) 0.025 % cream, PLEASE SEE ATTACHED FOR DETAILED DIRECTIONS, Disp: , Rfl:    Varenicline Tartrate (TYRVAYA) 0.03 MG/ACT SOLN, Place 1 spray into the nose daily., Disp: , Rfl:    XIIDRA 5 % SOLN, Apply 1 drop to eye 2 (two) times daily., Disp: , Rfl:    Objective:     Vitals:   03/30/21 1306  BP: 118/76  Pulse: 75  SpO2: 97%  Height: 5\' 3"  (1.6 m)      Body mass index is 30.26 kg/m.    Physical Exam:    General: Well-appearing, cooperative, sitting comfortably in no acute distress.   OMT Physical Exam:  ASIS Compression Test: Positive Right Cervical: TTP paraspinal, C4 RRSL Rib: Right elevated first rib with TTP Thoracic: TTP paraspinal, T4-9 RLSR Lumbar: TTP paraspinal, L1-3 RRSL Pelvis: Right anterior innominate    Electronically signed by:  [name] D.[facility] 1:59 PM 03/30/21

## 2021-03-30 ENCOUNTER — Other Ambulatory Visit: Payer: Self-pay

## 2021-03-30 ENCOUNTER — Ambulatory Visit: Payer: Medicare Other | Admitting: Sports Medicine

## 2021-03-30 VITALS — BP 118/76 | HR 75 | Ht 63.0 in

## 2021-03-30 DIAGNOSIS — M2550 Pain in unspecified joint: Secondary | ICD-10-CM | POA: Diagnosis not present

## 2021-03-30 DIAGNOSIS — M9905 Segmental and somatic dysfunction of pelvic region: Secondary | ICD-10-CM

## 2021-03-30 DIAGNOSIS — M5136 Other intervertebral disc degeneration, lumbar region: Secondary | ICD-10-CM | POA: Diagnosis not present

## 2021-03-30 DIAGNOSIS — M9901 Segmental and somatic dysfunction of cervical region: Secondary | ICD-10-CM | POA: Diagnosis not present

## 2021-03-30 DIAGNOSIS — M9908 Segmental and somatic dysfunction of rib cage: Secondary | ICD-10-CM

## 2021-03-30 DIAGNOSIS — M9902 Segmental and somatic dysfunction of thoracic region: Secondary | ICD-10-CM | POA: Diagnosis not present

## 2021-03-30 DIAGNOSIS — M9903 Segmental and somatic dysfunction of lumbar region: Secondary | ICD-10-CM

## 2021-03-30 LAB — COMPREHENSIVE METABOLIC PANEL
ALT: 28 U/L (ref 0–35)
AST: 26 U/L (ref 0–37)
Albumin: 4.3 g/dL (ref 3.5–5.2)
Alkaline Phosphatase: 82 U/L (ref 39–117)
BUN: 17 mg/dL (ref 6–23)
CO2: 29 mEq/L (ref 19–32)
Calcium: 10.1 mg/dL (ref 8.4–10.5)
Chloride: 106 mEq/L (ref 96–112)
Creatinine, Ser: 0.81 mg/dL (ref 0.40–1.20)
GFR: 74.29 mL/min (ref >60.00)
Glucose, Bld: 83 mg/dL (ref 70–99)
Potassium: 3.7 mEq/L (ref 3.5–5.1)
Sodium: 142 mEq/L (ref 135–145)
Total Bilirubin: 0.4 mg/dL (ref 0.2–1.2)
Total Protein: 6.8 g/dL (ref 6.0–8.3)

## 2021-03-30 LAB — TSH: TSH: 0.26 u[IU]/mL — ABNORMAL LOW (ref 0.35–5.50)

## 2021-03-30 LAB — SEDIMENTATION RATE: Sed Rate: 1 mm/hr (ref 0–30)

## 2021-03-30 LAB — URIC ACID: Uric Acid, Serum: 6.3 mg/dL (ref 2.4–7.0)

## 2021-03-30 LAB — CBC WITH DIFFERENTIAL/PLATELET
Basophils Absolute: 0 10*3/uL (ref 0.0–0.1)
Basophils Relative: 0.2 % (ref 0.0–3.0)
Eosinophils Absolute: 0.1 10*3/uL (ref 0.0–0.7)
Eosinophils Relative: 1.2 % (ref 0.0–5.0)
HCT: 38.7 % (ref 36.0–46.0)
Hemoglobin: 13.1 g/dL (ref 12.0–15.0)
Lymphocytes Relative: 42.2 % (ref 12.0–46.0)
Lymphs Abs: 2.1 10*3/uL (ref 0.7–4.0)
MCHC: 33.9 g/dL (ref 30.0–36.0)
MCV: 86.4 fl (ref 78.0–100.0)
Monocytes Absolute: 0.4 10*3/uL (ref 0.1–1.0)
Monocytes Relative: 8.1 % (ref 3.0–12.0)
Neutro Abs: 2.4 10*3/uL (ref 1.4–7.7)
Neutrophils Relative %: 48.3 % (ref 43.0–77.0)
Platelets: 157 10*3/uL (ref 150.0–400.0)
RBC: 4.48 Mil/uL (ref 3.87–5.11)
RDW: 14.8 % (ref 11.5–15.5)
WBC: 4.9 10*3/uL (ref 4.0–10.5)

## 2021-03-30 LAB — C-REACTIVE PROTEIN: CRP: <1 mg/dL (ref 0.5–20.0)

## 2021-03-30 LAB — FERRITIN: Ferritin: 76.9 ng/mL (ref 10.0–291.0)

## 2021-03-30 NOTE — Patient Instructions (Addendum)
Good to see you  Meloxicam daily for 2 weeks  Low back and neck HEP Lab work  3/4 week follow up

## 2021-04-04 LAB — VITAMIN D 1,25 DIHYDROXY
Vitamin D 1, 25 (OH)2 Total: 82 pg/mL — ABNORMAL HIGH (ref 18–72)
Vitamin D2 1, 25 (OH)2: <8 pg/mL
Vitamin D3 1, 25 (OH)2: 82 pg/mL

## 2021-04-04 LAB — ANTI-NUCLEAR AB-TITER (ANA TITER): ANA Titer 1: 1:40 {titer} — ABNORMAL HIGH

## 2021-04-04 LAB — CYCLIC CITRUL PEPTIDE ANTIBODY, IGG: Cyclic Citrullin Peptide Ab: <16 UNITS

## 2021-04-04 LAB — RHEUMATOID FACTOR: Rheumatoid fact SerPl-aCnc: <14 IU/mL (ref <14)

## 2021-04-04 LAB — ANA: Anti Nuclear Antibody (ANA): POSITIVE — AB

## 2021-04-04 NOTE — Progress Notes (Signed)
Attempted to contact patient to discuss results.  Left voice message that she could check her MyChart for review of findings.  No significant findings including a low positive ANA with speckled pattern.  This pattern is associated with different autoimmune conditions including mixed connective tissue disease, lupus, Sjogren's, dermatomyositis, polymyositis/sclerosis.  Her inflammatory markers were low including normal sed rate, CRP, ferritin, so we may be catching an autoimmune condition early in the process.  Rheumatoid factor was negative, so it does not appear to be rheumatoid arthritis.  With these findings I would recommend follow-up with a rheumatologist for further discussion on whether they would consider starting medication or doing a more specific work-up.  We can refer her to a rheumatologist if she would like.

## 2021-04-26 NOTE — Progress Notes (Signed)
[name] D.[place] Phone: [phone]   Assessment and Plan:     1. Polyarthralgia 2. Positive ANA (antinuclear antibody) -Chronic with exacerbation, subsequent visit - Lab work from previous visit positive for low positive ANA 1: 40 with nuclear speckled type, and mildly decreased TSH at 0.26 - We will refer to rheumatology for further work-up and management  3. DDD (degenerative disc disease), lumbar 4. Somatic dysfunction of thoracic region 5. Somatic dysfunction of lumbar region 6. Somatic dysfunction of cervical region 7. Somatic dysfunction of pelvic region 8. Somatic dysfunction of rib region -Chronic with exacerbation, subsequent visit - Recurrence of multiple musculoskeletal complaints with most prominent being in low back and right-sided neck - Patient has received significant relief with OMT in the past.  Elects for repeat OMT today.  Tolerated well per note below. - Decision today to treat with OMT was based on Physical Exam  After verbal consent patient was treated with HVLA (high velocity low amplitude), ME (muscle energy), FPR (flex positional release), ST (soft tissue), PC/PD (Pelvic Compression/ Pelvic Decompression) techniques in cervical, rib, thoracic, lumbar, and pelvic areas. Patient tolerated the procedure well with improvement in symptoms.  Patient educated on potential side effects of soreness and recommended to rest, hydrate, and use Tylenol as needed for pain control.    Pertinent previous records reviewed include lab work from 03/30/2021 including TSH, ANA, sed rate, ferritin, CBC, CMP   Follow Up: 4 weeks for repeat OMT maintenance   Subjective:   I, [name], am serving as a Education administrator for Doctor Glennon Mac  Chief Complaint: back pain   HPI:    03/23/21 Patient with chronic back/neck pain hoping to avoid surgical intervention.   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 She feels like she is doing better. Meloxicam is helping    Relevant Historical Information: Hashimoto's, DDD lumbar spine, history of SVT   Additional pertinent review of systems negative.   Current Outpatient Medications:    Cholecalciferol (VITAMIN D-3) 5000 units TABS, Take 5,000 mg by mouth See admin instructions. Monday and Friday, Disp: , Rfl:    ezetimibe (ZETIA) 10 MG tablet, Take 10 mg by mouth daily., Disp: , Rfl:    fluticasone (FLONASE) 50 MCG/ACT nasal spray, Place 2 sprays into both nostrils daily., Disp: 16 g, Rfl: 0   levothyroxine (SYNTHROID) 88 MCG tablet, Take 88 mcg by mouth daily., Disp: , Rfl:    metoprolol succinate (TOPROL-XL) 50 MG 24 hr tablet, Take 1 tablet (50 mg total) by mouth daily. Take with or immediately following a meal., Disp: 90 tablet, Rfl: 3   metoprolol tartrate (LOPRESSOR) 25 MG tablet, Take 1 tablet (25 mg total) by mouth 3 (three) times daily as needed (for recurrent  palpitations/SVT)., Disp: 90 tablet, Rfl: 3   Multiple Vitamin (MULTIVITAMIN WITH MINERALS) TABS tablet, Take 1 tablet by mouth daily., Disp: , Rfl:    polyethylene glycol (MIRALAX / GLYCOLAX) packet, Take 17 g by mouth daily as needed for mild constipation. , Disp: , Rfl:    Polyvinyl Alcohol-Povidone  PF (REFRESH) 1.4-0.6 % SOLN, Apply 1 drop to eye daily as needed (for dry eyes)., Disp: , Rfl:    pravastatin (PRAVACHOL) 20 MG tablet, Take 1 tablet (20 mg total) by mouth every evening., Disp: 30 tablet, Rfl: 11   Probiotic Product (PROBIOTIC ADVANCED PO), Take 1 capsule by mouth daily. ,  Disp: , Rfl:    tretinoin (RETIN-A) 0.025 % cream, PLEASE SEE ATTACHED FOR DETAILED DIRECTIONS, Disp: , Rfl:    Varenicline Tartrate (TYRVAYA) 0.03 MG/ACT SOLN, Place 1 spray into the nose daily., Disp: , Rfl:    XIIDRA 5 % SOLN, Apply 1 drop to eye 2 (two) times daily., Disp: , Rfl:    Objective:     Vitals:   04/27/21 1251  BP: 140/80  Pulse: 66  SpO2: 99%  Weight: 167 lb (75.8 kg)  Height: 5\' 3"  (1.6 m)      Body mass index is 29.58 kg/m.    Physical Exam:    General: Well-appearing, cooperative, sitting comfortably in no acute distress.   OMT Physical Exam:  ASIS Compression Test: Positive Right Cervical: TTP paraspinal, C3-6 RRSR Rib: Left elevated first rib with TTP Thoracic: TTP paraspinal, T3-6 RRSL Lumbar: TTP paraspinal, L1-3 RRSL Pelvis: Right anterior innominate    Electronically signed by:  [name] D.[facility] 1:18 PM 04/27/21

## 2021-04-27 ENCOUNTER — Other Ambulatory Visit: Payer: Self-pay

## 2021-04-27 ENCOUNTER — Ambulatory Visit: Payer: Medicare HMO | Admitting: Sports Medicine

## 2021-04-27 VITALS — BP 140/80 | HR 66 | Ht 63.0 in | Wt 167.0 lb

## 2021-04-27 DIAGNOSIS — M9908 Segmental and somatic dysfunction of rib cage: Secondary | ICD-10-CM

## 2021-04-27 DIAGNOSIS — M2550 Pain in unspecified joint: Secondary | ICD-10-CM | POA: Diagnosis not present

## 2021-04-27 DIAGNOSIS — M9902 Segmental and somatic dysfunction of thoracic region: Secondary | ICD-10-CM

## 2021-04-27 DIAGNOSIS — M9905 Segmental and somatic dysfunction of pelvic region: Secondary | ICD-10-CM

## 2021-04-27 DIAGNOSIS — R768 Other specified abnormal immunological findings in serum: Secondary | ICD-10-CM

## 2021-04-27 DIAGNOSIS — M5136 Other intervertebral disc degeneration, lumbar region: Secondary | ICD-10-CM

## 2021-04-27 DIAGNOSIS — M9903 Segmental and somatic dysfunction of lumbar region: Secondary | ICD-10-CM

## 2021-04-27 DIAGNOSIS — M9901 Segmental and somatic dysfunction of cervical region: Secondary | ICD-10-CM

## 2021-04-27 NOTE — Patient Instructions (Addendum)
Good to see you  Referral to rheumatology  4 week follow up for repeat OMT

## 2021-04-30 ENCOUNTER — Ambulatory Visit (HOSPITAL_BASED_OUTPATIENT_CLINIC_OR_DEPARTMENT_OTHER): Payer: Medicare HMO | Attending: Family Medicine | Admitting: Physical Therapy

## 2021-04-30 ENCOUNTER — Encounter (HOSPITAL_BASED_OUTPATIENT_CLINIC_OR_DEPARTMENT_OTHER): Payer: Self-pay | Admitting: Physical Therapy

## 2021-04-30 ENCOUNTER — Other Ambulatory Visit: Payer: Self-pay

## 2021-04-30 DIAGNOSIS — M25519 Pain in unspecified shoulder: Secondary | ICD-10-CM | POA: Insufficient documentation

## 2021-04-30 DIAGNOSIS — M25559 Pain in unspecified hip: Secondary | ICD-10-CM | POA: Insufficient documentation

## 2021-04-30 DIAGNOSIS — E063 Autoimmune thyroiditis: Secondary | ICD-10-CM | POA: Diagnosis not present

## 2021-04-30 DIAGNOSIS — M542 Cervicalgia: Secondary | ICD-10-CM | POA: Insufficient documentation

## 2021-04-30 DIAGNOSIS — M545 Low back pain, unspecified: Secondary | ICD-10-CM | POA: Insufficient documentation

## 2021-04-30 DIAGNOSIS — G8929 Other chronic pain: Secondary | ICD-10-CM | POA: Insufficient documentation

## 2021-04-30 DIAGNOSIS — R000 Tachycardia, unspecified: Secondary | ICD-10-CM | POA: Diagnosis not present

## 2021-04-30 DIAGNOSIS — M62838 Other muscle spasm: Secondary | ICD-10-CM | POA: Diagnosis not present

## 2021-04-30 NOTE — Therapy (Signed)
OUTPATIENT PHYSICAL THERAPY THORACOLUMBAR EVALUATION   Patient Name: [name] MRN: [id number] DOB:Dec 03, 1950, 71 y.o., female Today's Date: 05/01/2021   PT End of Session - 05/01/21 0913     Visit Number 1    Number of Visits 12    Date for PT Re-Evaluation 06/12/21    PT Start Time 3662    PT Stop Time 1228    PT Time Calculation (min) 43 min    Activity Tolerance Patient tolerated treatment well    Behavior During Therapy [facility] for tasks assessed/performed             Past Medical History:  Diagnosis Date   Arthritis    Neck   CIN I (cervical intraepithelial neoplasia I) 9476   Complication of anesthesia    delayed SVT:  can occur from a few hours to 3 days after the general anesthesia or conscious sedation as happened with her colonoscopy.    Dysplasia of cervix, low grade (CIN 1)    LEEP cervical conization September 2017 margins negative CIN-1   GERD (gastroesophageal reflux disease)    Hashimoto's thyroiditis    History of carpal tunnel syndrome    Left   History of colon polyps 2010   Benign   Hyperlipidemia    Hypothyroidism    Hashimoto's per pt    Osteopenia 10/2017   T score -1.9 FRAX 8.8% / 0.7%.  Stable from prior DEXA.   SVT (supraventricular tachycardia) ([place])    Wears glasses    Past Surgical History:  Procedure Laterality Date   BREAST SURGERY  1976   CYST REMOVED   CARDIAC ELECTROPHYSIOLOGY STUDY AND ABLATION  2001   CESAREAN SECTION  1987   COLONOSCOPY     COLONOSCOPY WITH PROPOFOL N/A 02/12/2019   Procedure: COLONOSCOPY WITH PROPOFOL;  Surgeon: Wonda Horner, MD;  Location: [facility] ENDOSCOPY;  Service: Endoscopy;  Laterality: N/A;   LEEP  2017   left thumb surgery and Carpal Tunnel Release  9/14   lower lid surgery   09/2018   dry eye from Hashimoto's   POLYPECTOMY  02/12/2019   Procedure: POLYPECTOMY;  Surgeon: Wonda Horner, MD;  Location: [facility] ENDOSCOPY;  Service: Endoscopy;;   Patient Active Problem List   Diagnosis Date  Noted   Statin myopathy 09/07/2019   Carpal tunnel syndrome of left wrist 03/25/2019   Abnormal CT of the abdomen 02/25/2018   Abdominal pain, right upper quadrant 02/25/2018   Abnormal finding on radiology exam 02/25/2018   Allergic rhinitis 02/25/2018   Brash 02/25/2018   CN (constipation) 02/25/2018   LBP (low back pain) 02/25/2018   Combined form of age-related cataract, both eyes 04/07/2017   Posterior vitreous detachment of right eye 04/07/2017   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   CIN I (cervical intraepithelial neoplasia I) 01/02/2016   Preoperative cardiovascular examination 11/10/2015   ASCUS with positive high risk HPV cervical 12/13/2014   Eyelid retraction 12/02/2014   Dry eye syndrome of bilateral lacrimal glands 12/01/2014   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   Other disorders affecting eyelid function 12/01/2014   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   Peripheral visual field defect of both eyes 12/01/2014   Age-related nuclear cataract of both eyes 09/06/2014   Anatomical narrow angle of both eyes 09/06/2014   Hx of Hashimoto thyroiditis 09/06/2014   Keratoconjunctivitis sicca of both eyes not specified as Sjogren's 09/06/2014   Meibomian gland dysfunction (MGD) of upper and lower lids of both eyes 09/06/2014  Abnormal Papanicolaou smear of cervix with positive human papilloma virus (HPV) test 06/27/2014   Hypokalemia 03/04/2014   History of paroxysmal supraventricular tachycardia 02/25/2014   Osteopenia 04/30/2013   Dysphonia 04/05/2013   Laryngopharyngeal reflux 04/05/2013   Hashimoto's thyroiditis 03/29/2013   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   Hypercalcemia 05/25/2012   Hypothyroidism 04/27/2012   Hyperlipidemia 04/27/2012   Paroxysmal supraventricular tachycardia ([place]) 04/27/2012   Tachycardia 01/22/2012   Endometrial polyp 12/04/2010   Fibroid, uterus 11/27/2010   Acquired hypothyroidism 01/17/2010   Other  and unspecified hyperlipidemia 02/01/2008   Other specified cardiac dysrhythmias(427.89) 12/07/2007    PCP: Cari Caraway, MD  REFERRING PROVIDER: Cari Caraway, MD  REFERRING DIAG: M54.2 (ICD-10-CM) - Cervicalgia  THERAPY DIAG:  Low Back Pain Cervicalgia  ONSET DATE: > 10 years   SUBJECTIVE:                                                                                                                                                                                           SUBJECTIVE STATEMENT: Patient has a long history of neck and back pain. The pain in her neck increases in the morning. Her low back hurts her the most when she is bending and reaching. She was a Marine scientist for 35 years.  PERTINENT HISTORY:  Hashimotos syndrome, periodic tachycardia ( hasn't happened in a while).   PAIN:  Are you having pain? Yes VAS scale: 3/10 current Max 8/10  Pain location: tighter on the left but feels it on both sides  Pain orientation: does not radiate down the arms   PAIN TYPE: sharp Pain description: intermittent  Aggravating factors: in the mornings  Relieving factors: ice heat, patches, exercises    Are you having pain? Yes VAS scale: 3-4/10 right now can reach a 10/10 (2-3 weeks ago was the last time)  Pain location: across the middle of the low back  Pain orientation: Bilateral and Lower  PAIN TYPE: aching  Pain description: intermittent  Aggravating factors: bending  Relieving factors: exercises  PRECAUTIONS: None  WEIGHT BEARING RESTRICTIONS No  FALLS:  Has patient fallen in last 6 months? No, Number of falls:   LIVING ENVIRONMENT: Steps int he house but dosen't effect the back or neck  OCCUPATION: Retired from TEFL teacher: Going to [employer], shopping, gardening, volunteering   PLOF: Independent  PATIENT GOALS   To decrease the patients pain and to develop a pool program    OBJECTIVE:   DIAGNOSTIC FINDINGS:  Nothing recent in the computer  PATIENT  SURVEYS:  FOTO 72% intake 72% expected   SCREENING FOR RED FLAGS:  Bowel or bladder incontinence: No Spinal tumors: No Cauda equina syndrome: No Compression fracture: No Abdominal aneurysm: No  COGNITION:  Overall cognitive status: Within functional limits for tasks assessed     SENSATION:  Light touch: Appears intact  Stereognosis: Appears intact  Hot/Cold: Appears intact  Proprioception: Appears intact No radicular pain   POSTURE:  Slight flexion when standing;   PALPATION: Significant spasming in the upper traps right > left   LUMBARAROM/PROM  A/PROM A/PROM  05/01/2021  Flexion 90 degrees without pain   Extension Mild pain   Right lateral flexion No pain   Left lateral flexion Mild pain   Right rotation 25% limited no pain 25% limited   Left rotation    (Blank rows = not tested)  Right rotation 80 degrees Left: 60  degrees  Flexion: 45   LE AROM/PROM:  MMT  Right 05/01/2021 Left 05/01/2021  Hip flexion 42 36.7  Hip extension    Hip abduction 48.3 38.4  Hip adduction    Hip internal rotation    Hip external rotation    Knee flexion    Knee extension 54.8 51.2  Ankle dorsiflexion    Ankle plantarflexion    Ankle inversion    Ankle eversion     (Blank rows = not tested)   PROM  Right 05/01/2021 Left 05/01/2021  Hip flexion WNL  WNL  Hip extension    Hip abduction    Hip adduction    Hip internal rotation WNL WNL  Hip external rotation WNL WNL  Knee flexion    Knee extension    Ankle dorsiflexion    Ankle plantarflexion    Ankle inversion    Ankle eversion     (Blank rows = not tested)   GAIT: Mild trunk flexion with gait otherwise normal gait pattern.    TODAY'S TREATMENT  Access Code: [email or web address] URL: [email or web address] Date: 05/01/2021 Prepared by: Carolyne Littles  Exercises Seated Hamstring Stretch  reps - 20 hold Supine Piriformis Stretch with Foot on Ground  3 reps - 20 hold Theracane Over Shoulder - 1 1 min   Gentle Levator Scapulae Stretch  reps - 20 hold Seated Upper Trapezius Stretch  3 reps - 20 hold   PATIENT EDUCATION:  Education details: HEP; symptom management, self soft tissue mobilization  Person educated: Patient Education method: Explanation, Demonstration, Tactile cues, and Verbal cues Education comprehension: verbalized understanding, returned demonstration, verbal cues required, tactile cues required, and needs further education   HOME EXERCISE PROGRAM:   ASSESSMENT:  CLINICAL IMPRESSION: Patient is a 71 year old female with a long history of beck and back pain. At this time her neck pain is most significant. She has increased pain when she wakes up in the morning. She has a large trigger point in her left upper trap. She has slightly decreased rotation to the left with her cervical spine. Her lumbar spine extension causes pain. Her motion is otherwise good. She has weakness in the left gluteal compared to the right. She has had therapy before for her neck and back. She is also having osteopathic manipulation which is helping. She has not had water therapy though. She would benefit from skilled therapy to reduce muscle spasming.   Objective impairments include decreased activity tolerance, decreased endurance, decreased mobility, difficulty walking, decreased ROM, decreased strength, increased fascial restrictions, increased muscle spasms, and pain. These impairments are limiting patient from cleaning, community activity, yard work, and shopping. Personal factors including 1-2 comorbidities: Hashimotos syndrome, tachycardia  are also affecting patient's functional outcome. Patient will benefit from skilled PT to address above impairments and improve overall function.  REHAB POTENTIAL: Good  CLINICAL DECISION MAKING: Evolving/moderate complexity increasing pain at times  EVALUATION COMPLEXITY: Moderate   GOALS: Goals reviewed with patient? Yes  SHORT TERM GOALS:  STG Name  Target Date Goal status  1 Patient will increase cervical rotation by 10 degrees  Baseline:  05/22/2021 INITIAL  2 Patient will demonstrate good knowledge of self trigger point release  Baseline:  05/22/2021 INITIAL  3 Patient will increase left abduction strength by 7 degrees.  Baseline: 05/22/2021 INITIAL  LONG TERM GOALS:   LTG Name Target Date Goal status  1 Patient will be independent with pool and gym program  Baseline: 06/12/2021 INITIAL  2 Patient will report a 50% reduction in pain with daily activity.  Baseline: 06/12/2021 INITIAL  3 Patient will demonstrate a 72%  ability on FOTO in order to demonstrate improved self perceived function   Baseline: 06/12/2021 INITIAL  PLAN: PT FREQUENCY: 1-2x/week  PT DURATION: 6 weeks  PLANNED INTERVENTIONS: Therapeutic exercises, Therapeutic activity, Neuro Muscular re-education, Balance training, Gait training, Patient/Family education, Joint mobilization, Aquatic Therapy, Dry Needling, Cryotherapy, Moist heat, Taping, and Manual therapy  PLAN FOR NEXT SESSION: consider manual therapy and TPDN to upper trap on land; review posterior chain strengthening; review light core exercises; in the water review core strengthening; stretching in the water;    Carney Living PT DPT  05/01/2021, 8:37 PM

## 2021-05-01 ENCOUNTER — Encounter (HOSPITAL_BASED_OUTPATIENT_CLINIC_OR_DEPARTMENT_OTHER): Payer: Self-pay | Admitting: Physical Therapy

## 2021-05-09 ENCOUNTER — Encounter: Payer: Self-pay | Admitting: Internal Medicine

## 2021-05-10 ENCOUNTER — Ambulatory Visit (HOSPITAL_BASED_OUTPATIENT_CLINIC_OR_DEPARTMENT_OTHER): Payer: Medicare HMO | Admitting: Physical Therapy

## 2021-05-10 ENCOUNTER — Other Ambulatory Visit: Payer: Self-pay

## 2021-05-10 ENCOUNTER — Encounter (HOSPITAL_BASED_OUTPATIENT_CLINIC_OR_DEPARTMENT_OTHER): Payer: Self-pay | Admitting: Physical Therapy

## 2021-05-10 DIAGNOSIS — M545 Low back pain, unspecified: Secondary | ICD-10-CM

## 2021-05-10 DIAGNOSIS — G8929 Other chronic pain: Secondary | ICD-10-CM

## 2021-05-10 DIAGNOSIS — M62838 Other muscle spasm: Secondary | ICD-10-CM

## 2021-05-10 DIAGNOSIS — M542 Cervicalgia: Secondary | ICD-10-CM

## 2021-05-10 NOTE — Therapy (Signed)
OUTPATIENT PHYSICAL THERAPY TREATMENT NOTE   Patient Name: [name] MRN: [id number] DOB:08-11-1950, 71 y.o., female Today's Date: 05/10/2021  PCP: Cari Caraway, MD REFERRING PROVIDER: Cari Caraway, MD   PT End of Session - 05/10/21 1159     Visit Number 2    Number of Visits 12    Date for PT Re-Evaluation 06/12/21    PT Start Time 1200    PT Stop Time 1245    PT Time Calculation (min) 45 min    Activity Tolerance Patient tolerated treatment well    Behavior During Therapy [facility] for tasks assessed/performed             Past Medical History:  Diagnosis Date   Arthritis    Neck   CIN I (cervical intraepithelial neoplasia I) 4098   Complication of anesthesia    delayed SVT:  can occur from a few hours to 3 days after the general anesthesia or conscious sedation as happened with her colonoscopy.    Dysplasia of cervix, low grade (CIN 1)    LEEP cervical conization September 2017 margins negative CIN-1   GERD (gastroesophageal reflux disease)    Hashimoto's thyroiditis    History of carpal tunnel syndrome    Left   History of colon polyps 2010   Benign   Hyperlipidemia    Hypothyroidism    Hashimoto's per pt    Osteopenia 10/2017   T score -1.9 FRAX 8.8% / 0.7%.  Stable from prior DEXA.   SVT (supraventricular tachycardia) ([place])    Wears glasses    Past Surgical History:  Procedure Laterality Date   BREAST SURGERY  1976   CYST REMOVED   CARDIAC ELECTROPHYSIOLOGY STUDY AND ABLATION  2001   CESAREAN SECTION  1987   COLONOSCOPY     COLONOSCOPY WITH PROPOFOL N/A 02/12/2019   Procedure: COLONOSCOPY WITH PROPOFOL;  Surgeon: Wonda Horner, MD;  Location: [facility] ENDOSCOPY;  Service: Endoscopy;  Laterality: N/A;   LEEP  2017   left thumb surgery and Carpal Tunnel Release  9/14   lower lid surgery   09/2018   dry eye from Hashimoto's   POLYPECTOMY  02/12/2019   Procedure: POLYPECTOMY;  Surgeon: Wonda Horner, MD;  Location: [facility] ENDOSCOPY;  Service:  Endoscopy;;   Patient Active Problem List   Diagnosis Date Noted   Statin myopathy 09/07/2019   Carpal tunnel syndrome of left wrist 03/25/2019   Abnormal CT of the abdomen 02/25/2018   Abdominal pain, right upper quadrant 02/25/2018   Abnormal finding on radiology exam 02/25/2018   Allergic rhinitis 02/25/2018   Brash 02/25/2018   CN (constipation) 02/25/2018   LBP (low back pain) 02/25/2018   Combined form of age-related cataract, both eyes 04/07/2017   Posterior vitreous detachment of right eye 04/07/2017   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   CIN I (cervical intraepithelial neoplasia I) 01/02/2016   Preoperative cardiovascular examination 11/10/2015   ASCUS with positive high risk HPV cervical 12/13/2014   Eyelid retraction 12/02/2014   Dry eye syndrome of bilateral lacrimal glands 12/01/2014   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   Other disorders affecting eyelid function 12/01/2014   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   Peripheral visual field defect of both eyes 12/01/2014   Age-related nuclear cataract of both eyes 09/06/2014   Anatomical narrow angle of both eyes 09/06/2014   Hx of Hashimoto thyroiditis 09/06/2014   Keratoconjunctivitis sicca of both eyes not specified as Sjogren's 09/06/2014   Meibomian gland dysfunction (MGD) of  upper and lower lids of both eyes 09/06/2014   Abnormal Papanicolaou smear of cervix with positive human papilloma virus (HPV) test 06/27/2014   Hypokalemia 03/04/2014   History of paroxysmal supraventricular tachycardia 02/25/2014   Osteopenia 04/30/2013   Dysphonia 04/05/2013   Laryngopharyngeal reflux 04/05/2013   Hashimoto's thyroiditis 03/29/2013   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   Hypercalcemia 05/25/2012   Hypothyroidism 04/27/2012   Hyperlipidemia 04/27/2012   Paroxysmal supraventricular tachycardia ([place]) 04/27/2012   Tachycardia 01/22/2012   Endometrial polyp 12/04/2010   Fibroid,  uterus 11/27/2010   Acquired hypothyroidism 01/17/2010   Other and unspecified hyperlipidemia 02/01/2008   Other specified cardiac dysrhythmias(427.89) 12/07/2007    REFERRING DIAG: M54.2 (ICD-10-CM) - Cervicalgia  THERAPY DIAG:  Chronic bilateral low back pain without sciatica  Cervicalgia  Other muscle spasm  PERTINENT HISTORY: Hashimotos syndrome, periodic tachycardia ( hasn't happened in a while).   PRECAUTIONS: none  SUBJECTIVE:                                                                                                                                                                                            SUBJECTIVE STATEMENT: Patient has a long history of neck and back pain. The pain in her neck increases in the morning. Her low back hurts her the most when she is bending and reaching. She was a Marine scientist for 35 years.   Pain Are you having pain? Yes VAS scale: 3-4/10 right now can reach a 10/10 (2-3 weeks ago was the last time)  Pain location: across the middle of the low back  Pain orientation: Bilateral and Lower  PAIN TYPE: aching  Pain description: intermittent  Aggravating factors: bending  Relieving factors: exercises    Subjective: Just came from aerobic exercises class at the [facility]    WEIGHT BEARING RESTRICTIONS No   FALLS:  Has patient fallen in last 6 months? No, Number of falls:    LIVING ENVIRONMENT: Steps int he house but dosen't effect the back or neck  OCCUPATION: Retired from Multimedia programmer: Going to [employer], shopping, gardening, volunteering    PLOF: Independent   PATIENT GOALS    To decrease the patients pain and to develop a pool program      OBJECTIVE:    DIAGNOSTIC FINDINGS:  Nothing recent in the computer   PATIENT SURVEYS:  FOTO 72% intake 72% expected    SCREENING FOR RED FLAGS: Bowel or bladder incontinence: No Spinal tumors: No Cauda equina syndrome: No Compression fracture: No Abdominal aneurysm: No    COGNITION:  Overall cognitive status: Within functional limits for tasks assessed                        SENSATION:          Light touch: Appears intact          Stereognosis: Appears intact          Hot/Cold: Appears intact          Proprioception: Appears intact No radicular pain    POSTURE:  Slight flexion when standing;    PALPATION: Significant spasming in the upper traps right > left    LUMBARAROM/PROM   A/PROM A/PROM  05/01/2021  Flexion 90 degrees without pain   Extension Mild pain   Right lateral flexion No pain   Left lateral flexion Mild pain   Right rotation 25% limited no pain 25% limited   Left rotation     (Blank rows = not tested)  Right rotation 80 degrees Left: 60  degrees  Flexion: 45     LE AROM/PROM:   MMT  Right 05/01/2021 Left 05/01/2021  Hip flexion 42 36.7  Hip extension      Hip abduction 48.3 38.4  Hip adduction      Hip internal rotation      Hip external rotation      Knee flexion      Knee extension 54.8 51.2  Ankle dorsiflexion      Ankle plantarflexion      Ankle inversion      Ankle eversion       (Blank rows = not tested)     PROM  Right 05/01/2021 Left 05/01/2021  Hip flexion WNL  WNL  Hip extension      Hip abduction      Hip adduction      Hip internal rotation WNL WNL  Hip external rotation WNL WNL  Knee flexion      Knee extension      Ankle dorsiflexion      Ankle plantarflexion      Ankle inversion      Ankle eversion       (Blank rows = not tested)     GAIT: Mild trunk flexion with gait otherwise normal gait pattern.      TODAY'S TREATMENT  Pt seen for aquatic therapy today.  Treatment took place in water 3.25-4.8 ft in depth at the [facility] pool. Temp of water was 91.  Pt entered/exited the pool via stairs (step through pattern) independently with bilat rail.  Intro to setting Sated stretching -STS from 3rd water bench x10 -flutter kick at hip alternating with add/abd core  tight 3x20 reps  Standing [place] bottom step then 2nd step x 10 Walking 4 widths forward then back with yellow hand buoys submerged in 3 ft Shoulder: horizontal abd/add, shoulder flex/ext; add/abd using yellow hand buoys x10 Resisted lateral core rotation using kick board R/L x10  Stretching Levator Scapulae Stretch  10 hold Upper Trapezius Stretch x10 hold Sub occipitals x10 hold    Pt requires buoyancy for support and to offload joints with strengthening exercises. Viscosity of the water is needed for resistance of strengthening; water current perturbations provides challenge to standing balance unsupported, requiring increased core activation.     Exercises Seated Hamstring Stretch  reps - 20 hold Supine Piriformis Stretch with Foot on Ground  3 reps - 20 hold Theracane Over Shoulder - 1 1 min  Gentle Levator Scapulae Stretch  reps - 20 hold Seated Upper Trapezius Stretch  3 reps - 20 hold     PATIENT EDUCATION:  Education details: HEP; symptom management, self soft tissue mobilization  Person educated: Patient Education method: Explanation, Demonstration, Tactile cues, and Verbal cues Education comprehension: verbalized understanding, returned demonstration, verbal cues required, tactile cues required, and needs further education     HOME EXERCISE PROGRAM:  [id number]   ASSESSMENT:   CLINICAL IMPRESSION: Pt demonstrates indep and safety in setting.  She reports just coming from aerobic exercise class at [facility]. Pt introduced to setting, edu on water principles and benefits of aquatic therapy.  Pt directed through various challenges seated and standing. Stretching completed of le, lb through cervical spine. Used hand buoys for resistance with ue strengthening.  Repeated VC needed for core tightness and shoulder depression throughout. Pt extends cervical spine multiple repetitions without discomfort at end range upon completion of session.    Objective impairments include  decreased activity tolerance, decreased endurance, decreased mobility, difficulty walking, decreased ROM, decreased strength, increased fascial restrictions, increased muscle spasms, and pain. These impairments are limiting patient from cleaning, community activity, yard work, and shopping. Personal factors including 1-2 comorbidities: Hashimotos syndrome, tachycardia   are also affecting patient's functional outcome. Patient will benefit from skilled PT to address above impairments and improve overall function.   REHAB POTENTIAL: Good   CLINICAL DECISION MAKING: Evolving/moderate complexity increasing pain at times   EVALUATION COMPLEXITY: Moderate     GOALS: Goals reviewed with patient? Yes   SHORT TERM GOALS:   STG Name Target Date Goal status  1 Patient will increase cervical rotation by 10 degrees  Baseline:  05/22/2021 INITIAL  2 Patient will demonstrate good knowledge of self trigger point release  Baseline:  05/22/2021 INITIAL  3 Patient will increase left abduction strength by 7 degrees.  Baseline: 05/22/2021 INITIAL  LONG TERM GOALS:    LTG Name Target Date Goal status  1 Patient will be independent with pool and gym program  Baseline: 06/12/2021 INITIAL  2 Patient will report a 50% reduction in pain with daily activity.  Baseline: 06/12/2021 INITIAL  3 Patient will demonstrate a 72%  ability on FOTO in order to demonstrate improved self perceived function   Baseline: 06/12/2021 INITIAL  PLAN: PT FREQUENCY: 1-2x/week   PT DURATION: 6 weeks   PLANNED INTERVENTIONS: Therapeutic exercises, Therapeutic activity, Neuro Muscular re-education, Balance training, Gait training, Patient/Family education, Joint mobilization, Aquatic Therapy, Dry Needling, Cryotherapy, Moist heat, Taping, and Manual therapy   PLAN FOR NEXT SESSION: consider manual therapy and TPDN to upper trap on land; review posterior chain strengthening; review light core exercises; in the water review core  strengthening; stretching in the water;      Macky Lower Fayrene Towner MPT 05/10/2021, 2:14 PM

## 2021-05-17 ENCOUNTER — Ambulatory Visit (HOSPITAL_BASED_OUTPATIENT_CLINIC_OR_DEPARTMENT_OTHER): Payer: Medicare HMO | Admitting: Physical Therapy

## 2021-05-17 ENCOUNTER — Encounter: Payer: Self-pay | Admitting: Sports Medicine

## 2021-05-17 ENCOUNTER — Encounter (HOSPITAL_BASED_OUTPATIENT_CLINIC_OR_DEPARTMENT_OTHER): Payer: Self-pay

## 2021-05-18 ENCOUNTER — Other Ambulatory Visit: Payer: Self-pay | Admitting: Gastroenterology

## 2021-05-18 ENCOUNTER — Other Ambulatory Visit (HOSPITAL_COMMUNITY): Payer: Self-pay | Admitting: Gastroenterology

## 2021-05-18 DIAGNOSIS — R194 Change in bowel habit: Secondary | ICD-10-CM

## 2021-05-18 DIAGNOSIS — R1011 Right upper quadrant pain: Secondary | ICD-10-CM

## 2021-05-21 ENCOUNTER — Telehealth: Payer: Self-pay | Admitting: *Deleted

## 2021-05-21 NOTE — Telephone Encounter (Signed)
° °  Pre-operative Risk Assessment    Patient Name: [name]  DOB: [birth year] MRN: [id number]      Request for Surgical Clearance    Procedure:   ENDOSCOPY  Date of Surgery:  Clearance TBD                                 Surgeon:  DR. Wilford Corner Surgeon's Group or Practice Name:  [facility] Phone number:  [phone] Fax number:  [phone]   Type of Clearance Requested:   - Medical    Type of Anesthesia:   PROPOFOL   Additional requests/questions:    Jiles Prows   05/21/2021, 1:44 PM

## 2021-05-21 NOTE — Telephone Encounter (Signed)
° °  Primary Cardiologist: Fransico Him, MD  Chart reviewed as part of pre-operative protocol coverage. Given past medical history and time since last visit, based on ACC/AHA guidelines, [name] would be at acceptable risk for the planned procedure without further cardiovascular testing.    I will route this recommendation to the requesting party via Epic fax function and remove from pre-op pool.  Please call with questions.  Jessica Ng. [name] NP-C    05/21/2021, 3:59 PM [facility] [address] [address] Office [phone] Fax [phone]

## 2021-05-23 ENCOUNTER — Ambulatory Visit: Payer: Medicare HMO

## 2021-05-24 ENCOUNTER — Encounter (HOSPITAL_BASED_OUTPATIENT_CLINIC_OR_DEPARTMENT_OTHER): Payer: Medicare HMO | Admitting: Physical Therapy

## 2021-05-24 ENCOUNTER — Encounter (HOSPITAL_BASED_OUTPATIENT_CLINIC_OR_DEPARTMENT_OTHER): Payer: Self-pay

## 2021-05-24 NOTE — Progress Notes (Signed)
[name] D.[place] Phone: [phone]   Assessment and Plan:     1. Positive ANA (antinuclear antibody) 2. Polyarthralgia -Chronic with exacerbation, subsequent visit - Continued multiple musculoskeletal complaints with positive ANA with nuclear speckled pattern, unremarkable CCP, sed rate, ferritin, CRP - Patient has appointment scheduled with rheumatology in March, however we will continue lab work-up at today's visit so that patient has more information at her establishing care appointment with rheumatology - Anti-DNA antibody, double-stranded - C3 and C4 - Complement, total - Cardiolipin antibodies, IgG, IgM, IgA - Lupus anticoagulant panel( [facility]) - ENA 9 Panel  3. DDD (degenerative disc disease), lumbar 4. Somatic dysfunction of cervical region 5. Somatic dysfunction of thoracic region 6. Somatic dysfunction of lumbar region 7. Somatic dysfunction of pelvic region 8. Somatic dysfunction of rib region -Chronic with exacerbation - Continued multiple musculoskeletal complaints with most prominent being in right-sided low back - Patient has received significant relief with OMT in the past.  Elects for repeat OMT today.  Tolerated well per note below. - Decision today to treat with OMT was based on Physical Exam   After verbal consent patient was treated with HVLA (high velocity low amplitude), ME (muscle energy), FPR (flex positional release), ST (soft tissue), PC/PD (Pelvic Compression/ Pelvic Decompression) techniques in cervical, rib, thoracic, lumbar, and pelvic areas. Patient tolerated the procedure well with improvement in symptoms.  Patient educated on potential side effects of soreness and recommended to rest, hydrate, and use Tylenol as needed for pain control.   Pertinent previous records reviewed include previous lab work from 03/30/2021 including ferritin, CRP, sed rate, ANA,  ANA pattern, CCP   Follow Up: 4 weeks for repeat maintenance OMT   Subjective:   I, [name], am serving as a Education administrator for Doctor [name]  Chief Complaint: back pain   HPI:  03/23/21 Patient with chronic back/neck pain hoping to avoid surgical intervention.   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 She feels like she is doing better. Meloxicam is helping   05/25/2021 Patient states that she's okay will hopefully be getting a CT scan soon , just here for some maintenance     Relevant Historical Information: Hashimoto's, DDD lumbar spine, history of SVT  Additional pertinent review of systems negative.  Current Outpatient Medications  Medication Sig Dispense Refill   Cholecalciferol (VITAMIN D-3) 5000 units TABS Take 5,000 mg by mouth See admin instructions. Monday and Friday     ezetimibe (ZETIA) 10 MG tablet Take 10 mg by mouth daily.     fluticasone (FLONASE) 50 MCG/ACT nasal spray Place 2 sprays into both nostrils daily. 16 g 0   levothyroxine (SYNTHROID) 88 MCG tablet Take 88 mcg by mouth daily.     metoprolol succinate (TOPROL-XL) 50 MG 24 hr tablet Take 1 tablet (50 mg total) by mouth daily. Take with or immediately following a meal. 90 tablet 3   metoprolol tartrate (LOPRESSOR) 25 MG tablet Take 1 tablet (25 mg total) by mouth 3 (three) times daily as needed (for recurrent  palpitations/SVT). 90 tablet 3   Multiple Vitamin (MULTIVITAMIN WITH MINERALS) TABS tablet Take 1 tablet by mouth daily.     polyethylene glycol (MIRALAX / GLYCOLAX) packet Take 17 g by mouth daily as needed for  mild constipation.      Polyvinyl Alcohol-Povidone PF (REFRESH) 1.4-0.6 % SOLN Apply 1 drop to eye daily as needed (for dry eyes).     pravastatin (PRAVACHOL) 20 MG tablet Take 1 tablet (20 mg total) by mouth every evening. 30 tablet 11   Probiotic Product (PROBIOTIC ADVANCED PO) Take 1 capsule by mouth daily.      tretinoin (RETIN-A) 0.025 % cream PLEASE SEE ATTACHED FOR DETAILED DIRECTIONS     Varenicline Tartrate (TYRVAYA) 0.03 MG/ACT SOLN Place 1 spray into the nose daily.     XIIDRA 5 % SOLN Apply 1 drop to eye 2 (two) times daily.     No current facility-administered medications for this visit.      Objective:     Vitals:   05/25/21 1247  BP: 140/70  Pulse: 60  SpO2: 97%  Weight: 167 lb (75.8 kg)  Height: 5\' 3"  (1.6 m)      Body mass index is 29.58 kg/m.    Physical Exam:     General: Well-appearing, cooperative, sitting comfortably in no acute distress.   OMT Physical Exam:  ASIS Compression Test: Positive Right Cervical: TTP left paraspinal, C3-5 RL SL Rib: Bilateral elevated first rib with mild TTP Thoracic: Mild TTP paraspinal,T3-6 RRSL Lumbar: Mild  TTP paraspinal, L1-3 RRSL Pelvis: Right anterior innominate  Electronically signed by:  [name] D.[facility] 1:14 PM 05/25/21

## 2021-05-25 ENCOUNTER — Other Ambulatory Visit: Payer: Self-pay

## 2021-05-25 ENCOUNTER — Ambulatory Visit: Payer: Medicare HMO | Admitting: Sports Medicine

## 2021-05-25 VITALS — BP 140/70 | HR 60 | Ht 63.0 in | Wt 167.0 lb

## 2021-05-25 DIAGNOSIS — M9901 Segmental and somatic dysfunction of cervical region: Secondary | ICD-10-CM

## 2021-05-25 DIAGNOSIS — M9905 Segmental and somatic dysfunction of pelvic region: Secondary | ICD-10-CM

## 2021-05-25 DIAGNOSIS — R768 Other specified abnormal immunological findings in serum: Secondary | ICD-10-CM

## 2021-05-25 DIAGNOSIS — M5136 Other intervertebral disc degeneration, lumbar region: Secondary | ICD-10-CM

## 2021-05-25 DIAGNOSIS — M2550 Pain in unspecified joint: Secondary | ICD-10-CM

## 2021-05-25 DIAGNOSIS — M9902 Segmental and somatic dysfunction of thoracic region: Secondary | ICD-10-CM

## 2021-05-25 DIAGNOSIS — M9903 Segmental and somatic dysfunction of lumbar region: Secondary | ICD-10-CM

## 2021-05-25 DIAGNOSIS — M9908 Segmental and somatic dysfunction of rib cage: Secondary | ICD-10-CM

## 2021-05-25 NOTE — Addendum Note (Signed)
Addended by: Glennon Mac on: 05/25/2021 02:24 PM   Modules accepted: Orders

## 2021-05-25 NOTE — Patient Instructions (Addendum)
Good to see you  Labs on the way out 4 week follow up for maintenance OMT

## 2021-05-26 LAB — EXTRACTABLE NUCLEAR ANTIGEN ANTIBODY
ENA RNP Ab: 0.2 AI (ref 0.0–0.9)
ENA SM Ab Ser-aCnc: <0.2 AI (ref 0.0–0.9)
ENA SSA (RO) Ab: <0.2 AI (ref 0.0–0.9)
ENA SSB (LA) Ab: <0.2 AI (ref 0.0–0.9)
Scleroderma (Scl-70) (ENA) Antibody, IgG: <0.2 AI (ref 0.0–0.9)
dsDNA Ab: 1 IU/mL (ref 0–9)

## 2021-05-27 ENCOUNTER — Other Ambulatory Visit: Payer: Self-pay

## 2021-05-27 ENCOUNTER — Emergency Department: Payer: Medicare HMO

## 2021-05-27 ENCOUNTER — Emergency Department
Admission: EM | Admit: 2021-05-27 | Discharge: 2021-05-27 | Disposition: A | Discharge status: Home / Self Care | Payer: Medicare HMO | Attending: Emergency Medicine | Admitting: Emergency Medicine

## 2021-05-27 DIAGNOSIS — R531 Weakness: Secondary | ICD-10-CM | POA: Insufficient documentation

## 2021-05-27 DIAGNOSIS — K5900 Constipation, unspecified: Secondary | ICD-10-CM | POA: Insufficient documentation

## 2021-05-27 DIAGNOSIS — R1011 Right upper quadrant pain: Secondary | ICD-10-CM | POA: Diagnosis present

## 2021-05-27 LAB — URINALYSIS, ROUTINE W REFLEX MICROSCOPIC
Bilirubin Urine: NEGATIVE
Glucose, UA: NEGATIVE mg/dL
Ketones, ur: NEGATIVE mg/dL
Leukocytes,Ua: NEGATIVE
Nitrite: NEGATIVE
Protein, ur: NEGATIVE mg/dL
Specific Gravity, Urine: <1.005 — ABNORMAL LOW (ref 1.005–1.030)
pH: 6.5 (ref 5.0–8.0)

## 2021-05-27 LAB — COMPREHENSIVE METABOLIC PANEL
ALT: 29 U/L (ref 0–44)
AST: 25 U/L (ref 15–41)
Albumin: 4.4 g/dL (ref 3.5–5.0)
Alkaline Phosphatase: 67 U/L (ref 38–126)
Anion gap: 5 (ref 5–15)
BUN: 18 mg/dL (ref 8–23)
CO2: 29 mmol/L (ref 22–32)
Calcium: 10.3 mg/dL (ref 8.9–10.3)
Chloride: 106 mmol/L (ref 98–111)
Creatinine, Ser: 0.88 mg/dL (ref 0.44–1.00)
GFR, Estimated: >60 mL/min (ref >60)
Glucose, Bld: 100 mg/dL — ABNORMAL HIGH (ref 70–99)
Potassium: 4 mmol/L (ref 3.5–5.1)
Sodium: 140 mmol/L (ref 135–145)
Total Bilirubin: 0.7 mg/dL (ref 0.3–1.2)
Total Protein: 7 g/dL (ref 6.5–8.1)

## 2021-05-27 LAB — CBC
HCT: 39.6 % (ref 36.0–46.0)
Hemoglobin: 13.9 g/dL (ref 12.0–15.0)
MCH: 29.4 pg (ref 26.0–34.0)
MCHC: 35.1 g/dL (ref 30.0–36.0)
MCV: 83.9 fL (ref 80.0–100.0)
Platelets: 166 10*3/uL (ref 150–400)
RBC: 4.72 MIL/uL (ref 3.87–5.11)
RDW: 14.9 % (ref 11.5–15.5)
WBC: 4.2 10*3/uL (ref 4.0–10.5)
nRBC: 0 % (ref 0.0–0.2)

## 2021-05-27 LAB — D-DIMER, QUANTITATIVE: D-Dimer, Quant: 0.49 ug/mL-FEU (ref 0.00–0.50)

## 2021-05-27 LAB — LIPASE, BLOOD: Lipase: 35 U/L (ref 11–51)

## 2021-05-27 LAB — LUPUS ANTICOAGULANT PANEL
Dilute Viper Venom Time: 31.9 s (ref 0.0–47.0)
PTT Lupus Anticoagulant: 33.6 s (ref 0.0–51.9)

## 2021-05-27 LAB — TROPONIN I (HIGH SENSITIVITY): Troponin I (High Sensitivity): 3 ng/L (ref <18)

## 2021-05-27 MED ORDER — DICYCLOMINE HCL 10 MG PO CAPS: 10.0000 mg | ORAL_CAPSULE | Freq: Three times a day (TID) | ORAL | 0 refills | Status: DC | PRN

## 2021-05-27 MED ORDER — SODIUM CHLORIDE 0.9 % IV BOLUS
1000.0000 mL | Freq: Once | INTRAVENOUS | Status: AC
  Administered 2021-05-27: 1000 mL via INTRAVENOUS

## 2021-05-27 MED ORDER — IOHEXOL 300 MG/ML  SOLN
100.0000 mL | Freq: Once | INTRAMUSCULAR | Status: AC | PRN
  Administered 2021-05-27: 100 mL via INTRAVENOUS

## 2021-05-27 NOTE — ED Triage Notes (Signed)
Pt comes pov from home with RUQ pain for 2 weeks. [place] negative. Waiting on CT scan to be done. Pt states she started feeling weak and palpitations this morning. Has hx of SVT but does not feel that it has kicked in yet. Nausea intermittently. Still has gallbladder.

## 2021-05-27 NOTE — Discharge Instructions (Signed)
Please follow-up closely with your gastroenterologist, I recommend calling Dr. Joette Catching clinic tomorrow.  Please let them know you had a CT scan today as well.  Please return to the emergency room right away if you are to develop a fever, severe nausea, your pain becomes severe or worsens, you are unable to keep food down, begin vomiting any dark or bloody fluid, you develop any dark or bloody stools, feel dehydrated, or other new concerns or symptoms arise.

## 2021-05-27 NOTE — ED Notes (Signed)
RN to bedside to introduce self to pt and obtain IV and labs.

## 2021-05-27 NOTE — ED Provider Notes (Signed)
[facility] Provider Note    Event Date/Time   First MD Initiated Contact with Patient 05/27/21 1045     (approximate)   History   Abdominal Pain and Weakness   HPI  [name] is a 71 y.o. female history of Hashimoto's, as well as degenerative joint disease and recent diagnosis of polyarthralgia following up with rheumatology   Has been experiencing weeks now of a pain that feels like a fullness in her right upper abdomen.  Also change in bowel habits she is feeling more constipated but still having bowel movements, but they seem to be smaller in nature.  She spoke with her GI physician and reports she had an ultrasound of her right upper abdomen in the clinic about a week and a half ago and was told this was normal and he had ordered a CT scan but she has not yet been able to have this performed of the abdomen  No chest pain no shortness of breath.  Traveled outside the [place] about 2 months ago but did not become ill thereafter  Feels nauseated but no vomiting.  No diarrhea no black or bloody stool.  Reports that she does not really want anything for nausea right now as Zofran causes her to feel constipated, and right now she feels constipated.  She has been trying MiraLAX for the last several days and is having occasional small bowel movements    Physical Exam   Triage Vital Signs: ED Triage Vitals  Enc Vitals Group     BP 05/27/21 1026 (!) 152/92     Pulse Rate 05/27/21 1026 66     Resp 05/27/21 1026 19     Temp 05/27/21 1026 97.6 F (36.4 C)     Temp Source 05/27/21 1026 Oral     SpO2 05/27/21 1026 100 %     Weight 05/27/21 1020 170 lb (77.1 kg)     Height 05/27/21 1020 5\' 3"  (1.6 m)     Head Circumference --      Peak Flow --      Pain Score 05/27/21 1020 8     Pain Loc --      Pain Edu? --      Excl. in [place]? --     Most recent vital signs: Vitals:   05/27/21 1219 05/27/21 1315  BP: (!) 150/80   Pulse: (!) 58 66   Resp: 18 17  Temp:    SpO2: 99% 100%     General: Awake, no distress.  Fully oriented.  Very pleasant. CV:  Good peripheral perfusion.  Normal heart tones.  No murmurs or irregularity Resp:  Normal effort.  Clear lung sounds throughout. Abd:  No distention.  4 to mild tenderness to palpation along the right inferior costal margin and epigastrium without rebound or guarding.  No obvious positivity to [facility] sign.  No pain in the lower abdomen bilateral.  No suprapubic tenderness. Other:  No leg or calf edema bilateral   ED Results / Procedures / Treatments   Labs (all labs ordered are listed, but only abnormal results are displayed) Labs Reviewed  COMPREHENSIVE METABOLIC PANEL - Abnormal; Notable for the following components:      Result Value   Glucose, Bld 100 (*)    All other components within normal limits  URINALYSIS, ROUTINE W REFLEX MICROSCOPIC - Abnormal; Notable for the following components:   Specific Gravity, Urine <1.005 (*)    Hgb urine dipstick TRACE (*)  Bacteria, UA RARE (*)    All other components within normal limits  CBC  LIPASE, BLOOD  D-DIMER, QUANTITATIVE (NOT AT [facility])  TROPONIN I (HIGH SENSITIVITY)     EKG  ED ECG REPORT I, Delman Kitten, the attending physician, personally viewed and interpreted this ECG.  Date: 05/27/2021 EKG Time: 1030 Rate: 70 Rhythm: normal sinus rhythm QRS Axis: normal Intervals: normal ST/T Wave abnormalities: normal Narrative Interpretation: no evidence of acute ischemia    RADIOLOGY  DG Chest 2 View  Result Date: 05/27/2021 CLINICAL DATA:  71 year old female with right lower chest or upper quadrant pain for 2 weeks. Weakness and palpitations this morning. EXAM: CHEST - 2 VIEW COMPARISON:  Chest radiographs 02/19/2021 and earlier. FINDINGS: Lung volumes and mediastinal contours remain normal. Visualized tracheal air column is within normal limits. Both lungs appear stable and clear. No pneumothorax or pleural  effusion. No pneumoperitoneum. Negative visible bowel gas. No acute osseous abnormality identified. IMPRESSION: Stable, negative.  No acute cardiopulmonary abnormality. Electronically Signed   By: Genevie Ann M.D.   On: 05/27/2021 11:35   CT ABDOMEN PELVIS W CONTRAST  Result Date: 05/27/2021 CLINICAL DATA:  Abdominal pain. EXAM: CT ABDOMEN AND PELVIS WITH CONTRAST TECHNIQUE: Multidetector CT imaging of the abdomen and pelvis was performed using the standard protocol following bolus administration of intravenous contrast. RADIATION DOSE REDUCTION: This exam was performed according to the departmental dose-optimization program which includes automated exposure control, adjustment of the mA and/or kV according to patient size and/or use of iterative reconstruction technique. CONTRAST:  141mL OMNIPAQUE IOHEXOL 300 MG/ML  SOLN COMPARISON:  CT abdomen dated 10/15/2019 FINDINGS: Lower chest: No acute abnormality. Hepatobiliary: Stable small hepatic cysts. No new or suspicious findings within the liver. Gallbladder appears normal. No bile duct dilatation seen. Pancreas: Unremarkable. No pancreatic ductal dilatation or surrounding inflammatory changes. Spleen: Normal in size without focal abnormality. Adrenals/Urinary Tract: Adrenal glands appear normal. Kidneys are unremarkable without mass, stone or hydronephrosis. No perinephric fluid. No ureteral or bladder calculi are identified. Bladder appears normal, partially decompressed. Stomach/Bowel: No dilated large or small bowel loops. No evidence of bowel wall inflammation. Appendix is not convincingly seen but there are no inflammatory changes about the cecum to suggest acute appendicitis. Stomach is unremarkable. Vascular/Lymphatic: Aortic atherosclerosis. No abdominal aortic aneurysm. No acute-appearing vascular abnormality. No enlarged lymph nodes seen in the abdomen or pelvis. Reproductive: Leiomyomatous uterus. No adnexal mass or free fluid seen. Other: No free fluid or  abscess collection seen. No free intraperitoneal air. Musculoskeletal: Degenerative spondylosis within the lower lumbar spine, at least moderate in degree at the lumbosacral junction with associated disc desiccation and articular surface sclerosis. No acute-appearing osseous abnormality. IMPRESSION: 1. No acute findings within the abdomen or pelvis. No bowel obstruction or evidence of bowel wall inflammation. No evidence of acute solid organ abnormality. No renal or ureteral calculi. 2. Leiomyomatous uterus. 3. Degenerative spondylosis within the lower lumbar spine, at least moderate in degree at the lumbosacral junction. Aortic Atherosclerosis (ICD10-I70.0). Electronically Signed   By: Franki Cabot M.D.   On: 05/27/2021 14:03    CT abdomen pelvis reviewed, negative for acute intra-abdominal finding.  The noted is a leiomyomatous uterus, discussed with the patient she reports she is already aware of this finding having fibroid uterus.   PROCEDURES:  Critical Care performed: No  Procedures   MEDICATIONS ORDERED IN ED: Medications  sodium chloride 0.9 % bolus 1,000 mL (0 mLs Intravenous Stopped 05/27/21 1317)  iohexol (OMNIPAQUE) 300 MG/ML solution 100  mL (100 mLs Intravenous Contrast Given 05/27/21 1343)     IMPRESSION / MDM / ASSESSMENT AND PLAN / ED COURSE  I reviewed the triage vital signs and the nursing notes.                              Differential diagnosis includes, but is not limited to, acute hepatobiliary or right upper quadrant pathology.  Pancreatitis seems less likely.  Colitis inflammatory irritable bowel disease.  Nephrolithiasis, UTI etc.  Also on the differential though seems very low with low pretest probability would be pulmonary embolism, referred pain or discomfort from the right chest wall or pulmonary nature.  Will check chest x-ray, D-dimer   Differential diagnosis includes but is not limited to, abdominal perforation, aortic dissection, cholecystitis, appendicitis,  diverticulitis, colitis, esophagitis/gastritis, kidney stone, pyelonephritis, urinary tract infection, aortic aneurysm. All are considered in decision and treatment plan. Based upon the patient's presentation and risk factors, we will proceed with CT imaging to further evaluate.  Patient reports recent recent right upper quadrant ultrasound given the focality of discomfort in this region I think further imaging to exclude mass, acute gross abnormality colitis diverticulitis etc. would be quite reasonable.      Patient is overall well-appearing nontoxic without acute distress.  Following with GI, Dr. Michail Sermon.  Also has ongoing active work-up for rheumatologic etiology is ongoing, though at this point I do not know that there is a clear association between her right upper quadrant pain and rheumatologic disorder.   Clinical Course as of 05/27/21 1438  Sun May 27, 2021  1238 I personally viewed and interpreted the patient's chest x-ray is negative for gross acute abnormality [MQ]  1323 Patient resting at this time.  Reports she feels somewhat better after receiving IV fluids.  She is in no distress.  Reviewed with her her results to this point.  Personally viewed and interpreted her lab results which include CBC and comprehensive metabolic panel as well as D-dimer that are all normal.  Low pretest probability for pulmonary embolism, D-dimer is exclusionary [MQ]    Clinical Course User Index [MQ] Delman Kitten, MD   Reviewed the patient's primary care provider note from February 3 of this year, patient having advanced testing for work-up of rheumatologic disorder  Discussed and reviewed the patient's findings including CT imaging with the patient.  All quite reassuring.  Urinalysis sent for culture, no obvious signs or symptoms of UTI but if culture positive would consider treatment for possible urinary cause of her right upper quadrant pain though not classical with regard to obvious symptoms of UTI  at this point.  Return precautions and treatment recommendations and follow-up discussed with the patient who is agreeable with the plan.  Discussed with patient we will trial prescription for Bentyl, and she will be calling her GI physician tomorrow.  He had already told her that if her CT scan was normal likely she would need endoscopy moving forward    FINAL CLINICAL IMPRESSION(S) / ED DIAGNOSES   Final diagnoses:  RUQ abdominal pain     Rx / DC Orders   ED Discharge Orders     None        Note:  This document was prepared using Dragon voice recognition software and may include unintentional dictation errors.   Delman Kitten, MD 05/27/21 1438

## 2021-05-27 NOTE — ED Notes (Signed)
Lab called RN to advise blue top was hemolyzed and another collection was needed but the other tubes were fine.

## 2021-05-28 ENCOUNTER — Encounter: Payer: Self-pay | Admitting: Cardiology

## 2021-05-28 LAB — C3 AND C4
C3 Complement: 117 mg/dL (ref 83–193)
C4 Complement: 32 mg/dL (ref 15–57)

## 2021-05-28 LAB — CARDIOLIPIN ANTIBODIES, IGG, IGM, IGA
Anticardiolipin IgA: <2 APL-U/mL
Anticardiolipin IgG: 2.9 GPL-U/mL
Anticardiolipin IgM: 2.2 MPL-U/mL

## 2021-05-28 LAB — EXTRA SPECIMEN

## 2021-05-28 LAB — COMPLEMENT, TOTAL: Compl, Total (CH50): >60 U/mL — ABNORMAL HIGH (ref 31–60)

## 2021-05-28 LAB — ANTI-DNA ANTIBODY, DOUBLE-STRANDED: ds DNA Ab: 1 IU/mL

## 2021-05-31 ENCOUNTER — Observation Stay (HOSPITAL_COMMUNITY)
Admission: EM | Admit: 2021-05-31 | Discharge: 2021-06-01 | Disposition: A | Discharge status: Home / Self Care | Payer: Medicare HMO | Attending: Internal Medicine | Admitting: Internal Medicine

## 2021-05-31 ENCOUNTER — Ambulatory Visit (HOSPITAL_BASED_OUTPATIENT_CLINIC_OR_DEPARTMENT_OTHER): Payer: Medicare HMO | Admitting: Physical Therapy

## 2021-05-31 ENCOUNTER — Encounter (HOSPITAL_COMMUNITY): Payer: Self-pay

## 2021-05-31 DIAGNOSIS — N19 Unspecified kidney failure: Secondary | ICD-10-CM | POA: Diagnosis not present

## 2021-05-31 DIAGNOSIS — R531 Weakness: Principal | ICD-10-CM

## 2021-05-31 DIAGNOSIS — Z79899 Other long term (current) drug therapy: Secondary | ICD-10-CM | POA: Diagnosis not present

## 2021-05-31 DIAGNOSIS — E039 Hypothyroidism, unspecified: Secondary | ICD-10-CM | POA: Diagnosis not present

## 2021-05-31 DIAGNOSIS — R2689 Other abnormalities of gait and mobility: Secondary | ICD-10-CM | POA: Diagnosis not present

## 2021-05-31 DIAGNOSIS — E860 Dehydration: Secondary | ICD-10-CM | POA: Diagnosis present

## 2021-05-31 DIAGNOSIS — E876 Hypokalemia: Secondary | ICD-10-CM | POA: Diagnosis not present

## 2021-05-31 DIAGNOSIS — Z20822 Contact with and (suspected) exposure to covid-19: Secondary | ICD-10-CM | POA: Insufficient documentation

## 2021-05-31 DIAGNOSIS — E785 Hyperlipidemia, unspecified: Secondary | ICD-10-CM | POA: Diagnosis present

## 2021-05-31 LAB — CBC WITH DIFFERENTIAL/PLATELET
Abs Immature Granulocytes: 0.01 10*3/uL (ref 0.00–0.07)
Abs Immature Granulocytes: 0.02 10*3/uL (ref 0.00–0.07)
Basophils Absolute: 0 10*3/uL (ref 0.0–0.1)
Basophils Absolute: 0 10*3/uL (ref 0.0–0.1)
Basophils Relative: 0 %
Basophils Relative: 0 %
Eosinophils Absolute: 0 10*3/uL (ref 0.0–0.5)
Eosinophils Absolute: 0 10*3/uL (ref 0.0–0.5)
Eosinophils Relative: 0 %
Eosinophils Relative: 0 %
HCT: 33.3 % — ABNORMAL LOW (ref 36.0–46.0)
HCT: 39.8 % (ref 36.0–46.0)
Hemoglobin: 11.8 g/dL — ABNORMAL LOW (ref 12.0–15.0)
Hemoglobin: 14.2 g/dL (ref 12.0–15.0)
Immature Granulocytes: 0 %
Immature Granulocytes: 0 %
Lymphocytes Relative: 30 %
Lymphocytes Relative: 41 %
Lymphs Abs: 1.6 10*3/uL (ref 0.7–4.0)
Lymphs Abs: 3.2 10*3/uL (ref 0.7–4.0)
MCH: 29.6 pg (ref 26.0–34.0)
MCH: 29.3 pg (ref 26.0–34.0)
MCHC: 35.4 g/dL (ref 30.0–36.0)
MCHC: 35.7 g/dL (ref 30.0–36.0)
MCV: 83.7 fL (ref 80.0–100.0)
MCV: 82.1 fL (ref 80.0–100.0)
Monocytes Absolute: 0.3 10*3/uL (ref 0.1–1.0)
Monocytes Absolute: 0.5 10*3/uL (ref 0.1–1.0)
Monocytes Relative: 5 %
Monocytes Relative: 7 %
Neutro Abs: 3.6 10*3/uL (ref 1.7–7.7)
Neutro Abs: 3.9 10*3/uL (ref 1.7–7.7)
Neutrophils Relative %: 65 %
Neutrophils Relative %: 52 %
Platelets: 139 10*3/uL — ABNORMAL LOW (ref 150–400)
Platelets: 168 10*3/uL (ref 150–400)
RBC: 3.98 MIL/uL (ref 3.87–5.11)
RBC: 4.85 MIL/uL (ref 3.87–5.11)
RDW: 14.9 % (ref 11.5–15.5)
RDW: 14.6 % (ref 11.5–15.5)
WBC: 5.5 10*3/uL (ref 4.0–10.5)
WBC: 7.7 10*3/uL (ref 4.0–10.5)
nRBC: 0 % (ref 0.0–0.2)
nRBC: 0 % (ref 0.0–0.2)

## 2021-05-31 LAB — POTASSIUM: Potassium: 3.9 mmol/L (ref 3.5–5.1)

## 2021-05-31 LAB — RESP PANEL BY RT-PCR (FLU A&B, COVID) ARPGX2
Influenza A by PCR: NEGATIVE
Influenza B by PCR: NEGATIVE
SARS Coronavirus 2 by RT PCR: NEGATIVE

## 2021-05-31 LAB — BASIC METABOLIC PANEL
Anion gap: 3 — ABNORMAL LOW (ref 5–15)
BUN: 16 mg/dL (ref 8–23)
CO2: 20 mmol/L — ABNORMAL LOW (ref 22–32)
Calcium: 7.1 mg/dL — ABNORMAL LOW (ref 8.9–10.3)
Chloride: 119 mmol/L — ABNORMAL HIGH (ref 98–111)
Creatinine, Ser: 0.58 mg/dL (ref 0.44–1.00)
GFR, Estimated: >60 mL/min (ref >60)
Glucose, Bld: 71 mg/dL (ref 70–99)
Potassium: 2.6 mmol/L — CL (ref 3.5–5.1)
Sodium: 142 mmol/L (ref 135–145)

## 2021-05-31 LAB — MAGNESIUM: Magnesium: 1.5 mg/dL — ABNORMAL LOW (ref 1.7–2.4)

## 2021-05-31 LAB — TROPONIN I (HIGH SENSITIVITY)
Troponin I (High Sensitivity): <2 ng/L (ref <18)
Troponin I (High Sensitivity): <2 ng/L (ref <18)

## 2021-05-31 LAB — POC OCCULT BLOOD, ED: Fecal Occult Bld: NEGATIVE

## 2021-05-31 LAB — TSH: TSH: 1.008 u[IU]/mL (ref 0.350–4.500)

## 2021-05-31 LAB — CBG MONITORING, ED: Glucose-Capillary: 94 mg/dL (ref 70–99)

## 2021-05-31 MED ORDER — ACETAMINOPHEN 325 MG PO TABS
650.0000 mg | ORAL_TABLET | Freq: Once | ORAL | Status: AC
  Administered 2021-05-31: 650 mg via ORAL
  Filled 2021-05-31: qty 2

## 2021-05-31 MED ORDER — POTASSIUM CHLORIDE 10 MEQ/100ML IV SOLN
10.0000 meq | Freq: Once | INTRAVENOUS | Status: AC
  Administered 2021-05-31: 10 meq via INTRAVENOUS
  Filled 2021-05-31: qty 100

## 2021-05-31 MED ORDER — ACETAMINOPHEN 325 MG PO TABS: 650.0000 mg | ORAL_TABLET | Freq: Four times a day (QID) | ORAL | Status: DC | PRN

## 2021-05-31 MED ORDER — SODIUM CHLORIDE 0.9 % IV SOLN: INTRAVENOUS | Status: DC

## 2021-05-31 MED ORDER — POTASSIUM CHLORIDE CRYS ER 20 MEQ PO TBCR
60.0000 meq | EXTENDED_RELEASE_TABLET | Freq: Once | ORAL | Status: AC
  Administered 2021-05-31: 60 meq via ORAL
  Filled 2021-05-31: qty 3

## 2021-05-31 MED ORDER — MAGNESIUM SULFATE 2 GM/50ML IV SOLN
2.0000 g | Freq: Once | INTRAVENOUS | Status: AC
  Administered 2021-05-31: 2 g via INTRAVENOUS
  Filled 2021-05-31: qty 50

## 2021-05-31 MED ORDER — ACETAMINOPHEN 650 MG RE SUPP: 650.0000 mg | Freq: Four times a day (QID) | RECTAL | Status: DC | PRN

## 2021-05-31 NOTE — ED Provider Notes (Addendum)
[facility] DEPT Provider Note   CSN: [id number] Arrival date & time: 05/31/21  1420     History  No chief complaint on file.   [name] is a 71 y.o. female.  71 year old female presents with episode of hypotension and tachycardia.  Patient states has been having symptoms for quite some time and is currently being evaluated by her primary care doctor.  Patient seen by endocrinology yesterday and has follow-up with rheumatology.  Patient states that during exercise program today's heart rate was around 120.  Does have a history of SVT but states that this did not feel like that.  She did not have any any chest pain or chest pressure with it.  Did become slightly dyspneic.  No URI symptoms or fever.  Had recent evaluation at [facility] for similar symptoms as she does have a history of Hashimoto's thyroiditis.  Notes that recently her doctor decreased her dose of thyroid medication.  States she feels back to baseline at this time      Home Medications Prior to Admission medications   Medication Sig Start Date End Date Taking? Authorizing Provider  Cholecalciferol (VITAMIN D-3) 5000 units TABS Take 5,000 mg by mouth See admin instructions. Monday and Friday    [provider]  dicyclomine (BENTYL) 10 MG capsule Take 1 capsule (10 mg total) by mouth 3 (three) times daily as needed for spasms. 05/27/21   Delman Kitten, MD  erythromycin ophthalmic ointment 3 (three) times daily. 04/07/21   [provider]  ezetimibe (ZETIA) 10 MG tablet Take 10 mg by mouth daily. 02/25/21   [provider]  fluticasone (FLONASE) 50 MCG/ACT nasal spray Place 2 sprays into both nostrils daily. 11/19/20   Scot Jun, FNP  levothyroxine (SYNTHROID) 50 MCG tablet Take 1 tablet by mouth in the morning. 05/21/21   [provider]  levothyroxine (SYNTHROID) 88 MCG tablet Take 88 mcg by mouth daily. 03/15/20   [provider]  LOTEMAX SM 0.38 % GEL  05/22/21   [provider]  metoprolol succinate (TOPROL-XL) 50 MG 24 hr tablet Take 1 tablet (50 mg total) by mouth daily. Take with or immediately following a meal. 10/09/20   Camnitz, Ocie Doyne, MD  metoprolol tartrate (LOPRESSOR) 25 MG tablet Take 1 tablet (25 mg total) by mouth 3 (three) times daily as needed (for recurrent  palpitations/SVT). 10/09/20   Camnitz, Ocie Doyne, MD  Multiple Vitamin (MULTIVITAMIN WITH MINERALS) TABS tablet Take 1 tablet by mouth daily.    [provider]  polyethylene glycol (MIRALAX / GLYCOLAX) packet Take 17 g by mouth daily as needed for mild constipation.     [provider]  Polyvinyl Alcohol-Povidone PF (REFRESH) 1.4-0.6 % SOLN Apply 1 drop to eye daily as needed (for dry eyes).    [provider]  pravastatin (PRAVACHOL) 20 MG tablet Take 1 tablet (20 mg total) by mouth every evening. 12/05/20   Freada Bergeron, MD  Probiotic Product (PROBIOTIC ADVANCED PO) Take 1 capsule by mouth daily.     [provider]  RESTASIS 0.05 % ophthalmic emulsion 1 drop 2 (two) times daily. 05/21/21   [provider]  tretinoin (RETIN-A) 0.025 % cream PLEASE SEE ATTACHED FOR DETAILED DIRECTIONS 02/09/21   [provider]  Varenicline Tartrate (TYRVAYA) 0.03 MG/ACT SOLN Place 1 spray into the nose daily. 02/28/21   [provider]  XIIDRA 5 % SOLN Apply 1 drop to eye 2 (two) times daily.  12/30/20   [provider]      Allergies    Diphenhydramine, Epinephrine, Lipitor [atorvastatin], Praluent [alirocumab], and Repatha [evolocumab]    Review of Systems   Review of Systems  All other systems reviewed and are negative.  Physical Exam Updated Vital Signs BP (!) 155/81    Pulse 60    Resp 18    LMP 11/26/2000    SpO2 96%  Physical Exam Vitals and nursing note reviewed.  Constitutional:      General: She is not in acute distress.    Appearance: Normal appearance.  She is well-developed. She is not toxic-appearing.  HENT:     Head: Normocephalic and atraumatic.  Eyes:     General: Lids are normal.     Conjunctiva/sclera: Conjunctivae normal.     Pupils: Pupils are equal, round, and reactive to light.  Neck:     Thyroid: No thyroid mass.     Trachea: No tracheal deviation.  Cardiovascular:     Rate and Rhythm: Normal rate and regular rhythm.     Heart sounds: Normal heart sounds. No murmur heard.   No gallop.  Pulmonary:     Effort: Pulmonary effort is normal. No respiratory distress.     Breath sounds: Normal breath sounds. No stridor. No decreased breath sounds, wheezing, rhonchi or rales.  Abdominal:     General: There is no distension.     Palpations: Abdomen is soft.     Tenderness: There is no abdominal tenderness. There is no rebound.  Musculoskeletal:        General: No tenderness. Normal range of motion.     Cervical back: Normal range of motion and neck supple.  Skin:    General: Skin is warm and dry.     Findings: No abrasion or rash.  Neurological:     Mental Status: She is alert and oriented to person, place, and time. Mental status is at baseline.     GCS: GCS eye subscore is 4. GCS verbal subscore is 5. GCS motor subscore is 6.     Cranial Nerves: No cranial nerve deficit.     Sensory: No sensory deficit.     Motor: Motor function is intact.  Psychiatric:        Attention and Perception: Attention normal.        Speech: Speech normal.        Behavior: Behavior normal.    ED Results / Procedures / Treatments   Labs (all labs ordered are listed, but only abnormal results are displayed) Labs Reviewed  CBC WITH DIFFERENTIAL/PLATELET  BASIC METABOLIC PANEL  CBG MONITORING, ED  TROPONIN I (HIGH SENSITIVITY)    EKG EKG Interpretation  Date/Time:  Thursday May 31 2021 15:19:08 EST Ventricular Rate:  65 PR Interval:  165 QRS Duration: 92 QT Interval:  395 QTC Calculation: 402 R Axis:   54 Text  Interpretation: Sinus rhythm Ventricular premature complex Confirmed by Lacretia Leigh ([id number]) on 05/31/2021 3:28:17 PM  Radiology No results found.  Procedures Procedures    Medications Ordered in ED Medications  0.9 %  sodium chloride infusion (has no administration in time range)    ED Course/ Medical Decision Making/ A&P                           Medical Decision Making Amount and/or Complexity of Data Reviewed Labs: ordered. ECG/medicine tests: ordered.  Risk OTC drugs. Prescription drug management.   Patient  is EKG from interpretation shows no signs of acute ischemia.  Patient electrolytes significant for hypokalemia.  This was replenished with oral as well as IV potassium.  Repeat potassium done and shows that it has been replenished.  Also hypomagnesemic and given IV mag here.  Cardiac enzymes performed and were negative.  Patient to be discharged home  9:56 PM Patient's potassium and magnesium replaced.  Plan was for her to be discharged.  Patient had sudden wave of weakness with palpitations and increased blood pressure.  Felt lightheaded and dizzy.  Has had multiple episodes of this recently.  Had a ED visit several days ago for same without diagnosis.  With patient describing intermittent bouts of hypertension patient could have some type of endocrinological issue such as pheochromocytoma.  Will consult hospitalist for admission for observation        Final Clinical Impression(s) / ED Diagnoses Final diagnoses:  None    Rx / DC Orders ED Discharge Orders     None         Lacretia Leigh, MD 05/31/21 2011    Lacretia Leigh, MD 05/31/21 2157

## 2021-05-31 NOTE — ED Notes (Signed)
Pt up to bathroom.

## 2021-05-31 NOTE — ED Triage Notes (Signed)
Pt presents via EMS with c/o dizziness. Pt was leaving her exercise class and tried to drive to the fire department as she became dizzy out of nowhere and she felt like her heart was fluttering. Diaphoretic for EMS. Initial BP 192/110 for EMS.

## 2021-06-01 ENCOUNTER — Telehealth: Payer: Self-pay | Admitting: Sports Medicine

## 2021-06-01 ENCOUNTER — Encounter (HOSPITAL_COMMUNITY): Payer: Self-pay | Admitting: Internal Medicine

## 2021-06-01 ENCOUNTER — Observation Stay (HOSPITAL_COMMUNITY): Payer: Medicare HMO

## 2021-06-01 ENCOUNTER — Encounter (HOSPITAL_BASED_OUTPATIENT_CLINIC_OR_DEPARTMENT_OTHER): Payer: Self-pay | Admitting: Physical Therapy

## 2021-06-01 DIAGNOSIS — R531 Weakness: Secondary | ICD-10-CM | POA: Diagnosis not present

## 2021-06-01 DIAGNOSIS — E860 Dehydration: Secondary | ICD-10-CM | POA: Diagnosis present

## 2021-06-01 DIAGNOSIS — N19 Unspecified kidney failure: Secondary | ICD-10-CM | POA: Diagnosis present

## 2021-06-01 LAB — CBC WITH DIFFERENTIAL/PLATELET
Abs Immature Granulocytes: 0.02 10*3/uL (ref 0.00–0.07)
Basophils Absolute: 0 10*3/uL (ref 0.0–0.1)
Basophils Relative: 0 %
Eosinophils Absolute: 0 10*3/uL (ref 0.0–0.5)
Eosinophils Relative: 1 %
HCT: 39.7 % (ref 36.0–46.0)
Hemoglobin: 14.4 g/dL (ref 12.0–15.0)
Immature Granulocytes: 0 %
Lymphocytes Relative: 43 %
Lymphs Abs: 2.8 10*3/uL (ref 0.7–4.0)
MCH: 30.1 pg (ref 26.0–34.0)
MCHC: 36.3 g/dL — ABNORMAL HIGH (ref 30.0–36.0)
MCV: 83.1 fL (ref 80.0–100.0)
Monocytes Absolute: 0.5 10*3/uL (ref 0.1–1.0)
Monocytes Relative: 8 %
Neutro Abs: 3.1 10*3/uL (ref 1.7–7.7)
Neutrophils Relative %: 48 %
Platelets: 177 10*3/uL (ref 150–400)
RBC: 4.78 MIL/uL (ref 3.87–5.11)
RDW: 15 % (ref 11.5–15.5)
WBC: 6.5 10*3/uL (ref 4.0–10.5)
nRBC: 0 % (ref 0.0–0.2)

## 2021-06-01 LAB — COMPREHENSIVE METABOLIC PANEL
ALT: 20 U/L (ref 0–44)
AST: 17 U/L (ref 15–41)
Albumin: 4.3 g/dL (ref 3.5–5.0)
Alkaline Phosphatase: 63 U/L (ref 38–126)
Anion gap: 7 (ref 5–15)
BUN: 17 mg/dL (ref 8–23)
CO2: 24 mmol/L (ref 22–32)
Calcium: 10.3 mg/dL (ref 8.9–10.3)
Chloride: 109 mmol/L (ref 98–111)
Creatinine, Ser: 0.93 mg/dL (ref 0.44–1.00)
GFR, Estimated: >60 mL/min (ref >60)
Glucose, Bld: 89 mg/dL (ref 70–99)
Potassium: 4 mmol/L (ref 3.5–5.1)
Sodium: 140 mmol/L (ref 135–145)
Total Bilirubin: 0.7 mg/dL (ref 0.3–1.2)
Total Protein: 6.6 g/dL (ref 6.5–8.1)

## 2021-06-01 LAB — URINALYSIS, COMPLETE (UACMP) WITH MICROSCOPIC
Bilirubin Urine: NEGATIVE
Glucose, UA: NEGATIVE mg/dL
Ketones, ur: NEGATIVE mg/dL
Leukocytes,Ua: NEGATIVE
Nitrite: NEGATIVE
Protein, ur: NEGATIVE mg/dL
Specific Gravity, Urine: 1.008 (ref 1.005–1.030)
pH: 5 (ref 5.0–8.0)

## 2021-06-01 LAB — MAGNESIUM: Magnesium: 2.5 mg/dL — ABNORMAL HIGH (ref 1.7–2.4)

## 2021-06-01 LAB — HIV ANTIBODY (ROUTINE TESTING W REFLEX): HIV Screen 4th Generation wRfx: NONREACTIVE

## 2021-06-01 MED ORDER — VITAMIN D-3 125 MCG (5000 UT) PO TABS: 5000.0000 [IU] | ORAL_TABLET | ORAL | Status: AC

## 2021-06-01 MED ORDER — PRAVASTATIN SODIUM 20 MG PO TABS
20.0000 mg | ORAL_TABLET | Freq: Every evening | ORAL | Status: DC
  Administered 2021-06-01: 20 mg via ORAL
  Filled 2021-06-01: qty 1

## 2021-06-01 MED ORDER — METOPROLOL SUCCINATE ER 50 MG PO TB24
50.0000 mg | ORAL_TABLET | Freq: Every day | ORAL | Status: DC
  Administered 2021-06-01: 50 mg via ORAL
  Filled 2021-06-01: qty 1

## 2021-06-01 MED ORDER — FENTANYL CITRATE PF 50 MCG/ML IJ SOSY: 50.0000 ug | PREFILLED_SYRINGE | INTRAMUSCULAR | Status: DC | PRN

## 2021-06-01 MED ORDER — EZETIMIBE 10 MG PO TABS
10.0000 mg | ORAL_TABLET | Freq: Every day | ORAL | Status: DC
  Administered 2021-06-01: 10 mg via ORAL
  Filled 2021-06-01: qty 1

## 2021-06-01 MED ORDER — LACTATED RINGERS IV SOLN: INTRAVENOUS | Status: AC

## 2021-06-01 MED ORDER — LEVOTHYROXINE SODIUM 88 MCG PO TABS
88.0000 ug | ORAL_TABLET | Freq: Every day | ORAL | Status: DC
  Administered 2021-06-01: 88 ug via ORAL
  Filled 2021-06-01: qty 1

## 2021-06-01 NOTE — Telephone Encounter (Signed)
Referral faxed

## 2021-06-01 NOTE — Assessment & Plan Note (Signed)
Continue home Synthroid.  TSH stable at 1.0

## 2021-06-01 NOTE — Assessment & Plan Note (Signed)
Resolved

## 2021-06-01 NOTE — Telephone Encounter (Signed)
We referred to [facility] for Rheumatology but they cannot get her in until April.  She has contacted others in our area but cannot get in soon enough. She has had 2 ED visits in the past 4 days.  Per pt request, please send referral and LABS to: [place] [facility] Fax [phone]

## 2021-06-01 NOTE — Assessment & Plan Note (Signed)
Continue home pravastatin and Zetia

## 2021-06-01 NOTE — Assessment & Plan Note (Signed)
AKI has resolved.  Encourage oral intake

## 2021-06-01 NOTE — Evaluation (Signed)
Physical Therapy Evaluation Patient Details Name: [name] MRN: [id number] DOB: 1950-05-19 Today's Date: 06/01/2021  History of Present Illness  Patient is a 71 year old female presenting to ED with generalized weakness. PMH:  acquired hypothyroidism, hyperlipidemia, SVT status post ablation in 2001  Clinical Impression       Patient lives at home and is independent at baseline including going to silver sneakers 4-5x/week. Patient's DTR lives with her. Patient demonstrates ability to perform self care tasks such as lower body dressing, functional transfers and ambulation without physical assistance. Does report some dizziness with activity, orthostatics taken and provided to RN. No acute PT needs identified will sign off and defer to mobility team.  Please re-consult if new needs arise.       Recommendations for follow up therapy are one component of a multi-disciplinary discharge planning process, led by the attending physician.  Recommendations may be updated based on patient status, additional functional criteria and insurance authorization.  Follow Up Recommendations No PT follow up    Assistance Recommended at Discharge None  Patient can return home with the following  Assist for transportation    Equipment Recommendations None recommended by PT  Recommendations for Other Services       Functional Status Assessment       Precautions / Restrictions Precautions Precautions: None Restrictions Weight Bearing Restrictions: No      Mobility  Bed Mobility Overal bed mobility: Independent                  Transfers Overall transfer level: Independent                      Ambulation/Gait Ambulation/Gait assistance: Supervision, Independent Gait Distance (Feet): 200 Feet Assistive device: None Gait Pattern/deviations: Step-through pattern, Decreased step length - right, Decreased step length - left, Shuffle, Wide base of support        General Gait Details: Pt with decreased pace from normal and slightly widened BOS to compensate for mild instability. No LOB and good safety awareness.  Stairs            Wheelchair Mobility    Modified Rankin (Stroke Patients Only)       Balance Overall balance assessment: Independent                                           Pertinent Vitals/Pain Pain Assessment Pain Assessment: 0-10 Pain Score: 6  Pain Location: chest Pain Descriptors / Indicators: Discomfort Pain Intervention(s): Limited activity within patient's tolerance, Monitored during session    Home Living Family/patient expects to be discharged to:: Private residence Living Arrangements: Children Available Help at Discharge: Family Type of Home: House Home Access: Level entry       Home Layout: One level Home Equipment: None      Prior Function Prior Level of Function : Independent/Modified Independent                     Hand Dominance   Dominant Hand: Right    Extremity/Trunk Assessment   Upper Extremity Assessment Upper Extremity Assessment: Overall WFL for tasks assessed    Lower Extremity Assessment Lower Extremity Assessment: Overall WFL for tasks assessed    Cervical / Trunk Assessment Cervical / Trunk Assessment: Normal  Communication   Communication: No difficulties  Cognition Arousal/Alertness: Awake/alert Behavior During Therapy: [facility]  for tasks assessed/performed Overall Cognitive Status: Within Functional Limits for tasks assessed                                          General Comments      Exercises     Assessment/Plan    PT Assessment Patient needs continued PT services  PT Problem List Decreased activity tolerance;Pain       PT Treatment Interventions Gait training;Balance training    PT Goals (Current goals can be found in the Care Plan section)  Acute Rehab PT Goals Patient Stated Goal: REgain strength PT  Goal Formulation: All assessment and education complete, DC therapy    Frequency Min 1X/week     Co-evaluation               AM-PAC PT "6 Clicks" Mobility  Outcome Measure Help needed turning from your back to your side while in a flat bed without using bedrails?: None Help needed moving from lying on your back to sitting on the side of a flat bed without using bedrails?: None Help needed moving to and from a bed to a chair (including a wheelchair)?: None Help needed standing up from a chair using your arms (e.g., wheelchair or bedside chair)?: None Help needed to walk in hospital room?: None Help needed climbing 3-5 steps with a railing? : A Little 6 Click Score: 23    End of Session Equipment Utilized During Treatment: Gait belt Activity Tolerance: Patient tolerated treatment well;Patient limited by fatigue Patient left: in bed;with call bell/phone within reach Nurse Communication: Mobility status PT Visit Diagnosis: Difficulty in walking, not elsewhere classified (R26.2)    Time: 2103-1281 PT Time Calculation (min) (ACUTE ONLY): 21 min   Charges:   PT Evaluation $PT Eval Low Complexity: 1 Low          [place] Pager [phone] Office [phone]   Margy Sumler 06/01/2021, 10:47 AM

## 2021-06-01 NOTE — Hospital Course (Addendum)
19 F w/ h/o acquired hypothyroidism, hyperlipidemia, SVT with history of ablation in 2001 presented to [facility] with generalized weakness of 1 week, difficulty performing ADLs. Seen in the ED vitals stable Labs reviewed severe hypokalemia, hypomagnesemia troponin negative x2 otherwise no acute finding at chest x-ray-no acute finding, UA ordered.  Electrolytes replaced and admitted for overnight for dehydration electrolyte imbalance AND MILD Aki. Next morning orthostatic vitals negative.  Electrolytes have normalized. This morning no new complaints agreeable for discharge home.  She did endorse ongoing weakness myopathy-we will plan to see rheumatology soon.  I have provided additional rheumatology number for her follow-up sooner per request

## 2021-06-01 NOTE — Assessment & Plan Note (Addendum)
Likely in the setting of dehydration and electrolyte imbalance.  Obtain PT OT eval for disposition-no further PT OT needs.  Patient to be discharged home.  She will have follow-up with rheumatology also provide additional rheumatology number given her complaint of several weeks of weakness/myopathy

## 2021-06-01 NOTE — Evaluation (Signed)
Occupational Therapy Evaluation Patient Details Name: [name] MRN: [id number] DOB: 02-01-1951 Today's Date: 06/01/2021   History of Present Illness Patient is a 71 year old female presenting to ED with generalized weakness. PMH:  acquired hypothyroidism, hyperlipidemia, SVT status post ablation in 2001   Clinical Impression   Patient lives at home and is independent at baseline with self care. Goes to silver sneakers 4-5x/week. Patient's DTR lives with her. Patient demonstrates ability to perform self care tasks such as lower body dressing, functional transfers and ambulation without physical assistance. Does report some dizziness with activity, orthostatics taken and provided to RN. No acute OT needs identified will sign off. Please re-consult if new needs arise.  Semi-supine 102/70 Sitting 130/94 Standing 0 minutes 127/94 Standing 3 minutes 121/109 Post ambulation 136/96      Recommendations for follow up therapy are one component of a multi-disciplinary discharge planning process, led by the attending physician.  Recommendations may be updated based on patient status, additional functional criteria and insurance authorization.   Follow Up Recommendations  No OT follow up    Assistance Recommended at Discharge None     Functional Status Assessment  Patient has not had a recent decline in their functional status  Equipment Recommendations  None recommended by OT       Precautions / Restrictions Precautions Precautions: None Restrictions Weight Bearing Restrictions: No      Mobility Bed Mobility Overal bed mobility: Independent                  Transfers Overall transfer level: Independent                        Balance Overall balance assessment: Independent                                         ADL either performed or assessed with clinical judgement   ADL Overall ADL's : Independent;At baseline                                        General ADL Comments: Patient able to perform transfers, lower body dressing and functional ambulation without assistance      Pertinent Vitals/Pain Pain Assessment Pain Assessment: 0-10 Pain Score: 6  Pain Location: chest Pain Descriptors / Indicators: Discomfort Pain Intervention(s): Monitored during session     Hand Dominance Right   Extremity/Trunk Assessment Upper Extremity Assessment Upper Extremity Assessment: Overall WFL for tasks assessed   Lower Extremity Assessment Lower Extremity Assessment: Defer to PT evaluation   Cervical / Trunk Assessment Cervical / Trunk Assessment: Normal   Communication Communication Communication: No difficulties   Cognition Arousal/Alertness: Awake/alert Behavior During Therapy: WFL for tasks assessed/performed Overall Cognitive Status: Within Functional Limits for tasks assessed                                                  Home Living Family/patient expects to be discharged to:: Private residence Living Arrangements: Children (DTR lives with patient) Available Help at Discharge: Family Type of Home: House Home Access: Level entry     Home Layout: One level  Bathroom Shower/Tub: Medical sales representative: None          Prior Functioning/Environment Prior Level of Function : Independent/Modified Independent                        OT Problem List: Decreased activity tolerance         OT Goals(Current goals can be found in the care plan section) Acute Rehab OT Goals Patient Stated Goal: Feel better OT Goal Formulation: All assessment and education complete, DC therapy   AM-PAC OT "6 Clicks" Daily Activity     Outcome Measure Help from another person eating meals?: None Help from another person taking care of personal grooming?: None Help from another person toileting, which includes using toliet,  bedpan, or urinal?: None Help from another person bathing (including washing, rinsing, drying)?: None Help from another person to put on and taking off regular upper body clothing?: None Help from another person to put on and taking off regular lower body clothing?: None 6 Click Score: 24   End of Session Nurse Communication: Mobility status;Other (comment) (orthostatic BPs (negative))  Activity Tolerance: Patient tolerated treatment well Patient left: in bed;with call bell/phone within reach  OT Visit Diagnosis: Other abnormalities of gait and mobility (R26.89)                Time: 4008-6761 OT Time Calculation (min): 26 min Charges:  OT General Charges $OT Visit: 1 Visit OT Evaluation $OT Eval Low Complexity: [place] OT OT pager: [phone]   Rosemary Holms 06/01/2021, 10:09 AM

## 2021-06-01 NOTE — ED Notes (Signed)
Pt c/o new chest pain; EKG and vital signs obtained and Dr. Velia Meyer informed of pt's pain; orders given and carried out

## 2021-06-01 NOTE — ED Notes (Signed)
PT at bedside.

## 2021-06-01 NOTE — H&P (Signed)
History and Physical    PLEASE NOTE THAT DRAGON DICTATION SOFTWARE WAS USED IN THE CONSTRUCTION OF THIS NOTE.   [name] QMG:[id number] DOB: 11/23/1950 DOA: 05/31/2021  PCP: Cari Caraway, MD  Patient coming from: home   I have personally briefly reviewed patient's old medical records in [place]  Chief Complaint: Generalized weakness  HPI: [name] is a 71 y.o. female with medical history significant for acquired hypothyroidism, hyperlipidemia, SVT status post ablation in 2001, who is admitted to [facility] on 05/31/2021 with generalized weakness after presenting from home to [facility] ED complaining of such.   The patient reports 1 week of progressive generalized weakness, in the absence of any associated acute focal weakness, acute focal numbness, paresthesias, dysarthria, facial droop, acute change in vision, vertigo, dysphagia, or headache.  However, over the course of progressive generalized weakness over the last week , patient reports progressive difficulty in performing ADLs. Overall, the patient reports this recent onset of progressive generalized weakness over the course of the last week has significantly impacted their ability to function independently.  Denies any subjective fever, chills, rigors, or generalized myalgias. Denies any recent headache, neck stiffness, rhinitis, rhinorrhea, sore throat, sob, wheezing, cough, nausea, vomiting, abdominal pain, diarrhea, or rash. No recent traveling or known COVID-19 exposures. Denies dysuria, gross hematuria, or change in urinary urgency/frequency.  Denies any recent chest pain, diaphoresis, or palpitations.  Medical history notable for acquired hypothyroidism, for which the patient reports outstanding compliance with taking her home Synthroid.    ED Course:  Vital signs in the ED were notable for the following: Afebrile; heart rate 57-75; blood pressure 124/85; respiratory rate 15-18, oxygen  saturation 96 to 100% on room air.  Labs were notable for the following: BMP notable for the following: Sodium 142, potassium 2.6, BUN 16, creatinine 0.58 compared to 0.88 on 05/27/2021 code BUN to creatinine ratio 27.6 glucose 71.  Serum magnesium level 1.5.  High-sensitivity troponin I x2 values less than 2.  CBC notable for white blood cell count 5500, hemoglobin 11.8.  COVID-19/influenza PCR negative.  Imaging and additional notable ED work-up: EKG shows sinus rhythm with single PVC, heart rate 65, normal intervals, and no evidence of T wave or ST changes, including no evidence of ST elevation.  While in the ED, the following were administered: Potassium chloride 60 mEq p.o. x1 dose, potassium chloride 10 mEq IV over 1 hour x 1 dose, magnesium sulfate 2 g IV over 2 hours x 1 dose.  Subsequently, the patient was admitted for overnight observation for further evaluation and management of generalized weakness, complicated by finding of hypokalemia as well as hypomagnesemia, with clinical suspicion for element of dehydration in the setting of presenting acute prerenal azotemia.     Review of Systems: As per HPI otherwise 10 point review of systems negative.   Past Medical History:  Diagnosis Date   Arthritis    Neck   CIN I (cervical intraepithelial neoplasia I) 3267   Complication of anesthesia    delayed SVT:  can occur from a few hours to 3 days after the general anesthesia or conscious sedation as happened with her colonoscopy.    Dysplasia of cervix, low grade (CIN 1)    LEEP cervical conization September 2017 margins negative CIN-1   GERD (gastroesophageal reflux disease)    Hashimoto's thyroiditis    History of carpal tunnel syndrome    Left   History of colon polyps 2010   Benign   Hyperlipidemia  Hypothyroidism    Hashimoto's per pt    Osteopenia 10/2017   T score -1.9 FRAX 8.8% / 0.7%.  Stable from prior DEXA.   SVT (supraventricular tachycardia) ([place])    Wears glasses      Past Surgical History:  Procedure Laterality Date   BREAST SURGERY  1976   CYST REMOVED   CARDIAC ELECTROPHYSIOLOGY STUDY AND ABLATION  2001   CESAREAN SECTION  1987   COLONOSCOPY     COLONOSCOPY WITH PROPOFOL N/A 02/12/2019   Procedure: COLONOSCOPY WITH PROPOFOL;  Surgeon: Wonda Horner, MD;  Location: [facility] ENDOSCOPY;  Service: Endoscopy;  Laterality: N/A;   LEEP  2017   left thumb surgery and Carpal Tunnel Release  9/14   lower lid surgery   09/2018   dry eye from Hashimoto's   POLYPECTOMY  02/12/2019   Procedure: POLYPECTOMY;  Surgeon: Wonda Horner, MD;  Location: [facility] ENDOSCOPY;  Service: Endoscopy;;    Social History:  reports that she has never smoked. She has never used smokeless tobacco. She reports that she does not currently use alcohol. She reports that she does not use drugs.   Allergies  Allergen Reactions   Diphenhydramine Palpitations    Other reaction(s): tachycardia   Epinephrine Palpitations   Lipitor [Atorvastatin] Other (See Comments)    Muscle aches   Praluent [Alirocumab]     runny nose, sinus congestion, myalgias, UTI, weight gain   Repatha [Evolocumab]     Joint pain    Family History  Problem Relation Age of Onset   Hypertension Mother    Heart disease Mother    Mitral valve prolapse Mother    Glaucoma Mother    Arthritis Mother    Von Willebrand disease Mother    Stroke Mother    Rheum arthritis Mother    Neuropathy Mother    Hypertension Father    Heart disease Father    Colon polyps Father    Glaucoma Father    Arthritis Father    Stroke Father    Rheum arthritis Father    Pulmonary embolism Father    Hypertension Brother    Mitral valve prolapse Brother    Glaucoma Brother    Arthritis Brother    Arthritis Sister     Family history reviewed and not pertinent    Prior to Admission medications   Medication Sig Start Date End Date Taking? Authorizing Provider  Cholecalciferol (VITAMIN D-3) 5000 units TABS Take 5,000 mg by  mouth See admin instructions. Monday and Friday   Yes [provider]  ezetimibe (ZETIA) 10 MG tablet Take 10 mg by mouth at bedtime. 02/25/21  Yes [provider]  ibuprofen (ADVIL) 200 MG tablet Take 400-600 mg by mouth daily as needed for moderate pain.   Yes [provider]  levothyroxine (SYNTHROID) 88 MCG tablet Take 88 mcg by mouth daily. 03/15/20  Yes [provider]  LOTEMAX SM 0.38 % GEL Place 1 drop into both eyes as directed. 1 drop QID for 7 Days, 1 drop TID for 7 Days, 1 drop BID for 7 Days, 1 drop Daily for 7 Days 05/22/21  Yes [provider]  metoprolol succinate (TOPROL-XL) 50 MG 24 hr tablet Take 1 tablet (50 mg total) by mouth daily. Take with or immediately following a meal. Patient taking differently: Take 50 mg by mouth at bedtime. Take with or immediately following a meal. 10/09/20  Yes Camnitz, Ocie Doyne, MD  Multiple Vitamin (MULTIVITAMIN WITH MINERALS) TABS tablet Take  1 tablet by mouth daily.   Yes [provider]  polyethylene glycol (MIRALAX / GLYCOLAX) packet Take 17 g by mouth daily as needed for mild constipation.    Yes [provider]  Polyvinyl Alcohol-Povidone PF (REFRESH) 1.4-0.6 % SOLN Apply 1 drop to eye 3 (three) times daily as needed (for dry eyes).   Yes [provider]  pravastatin (PRAVACHOL) 20 MG tablet Take 1 tablet (20 mg total) by mouth every evening. 12/05/20  Yes Freada Bergeron, MD  Probiotic Product (PROBIOTIC ADVANCED PO) Take 1 capsule by mouth daily.    Yes [provider]  RESTASIS 0.05 % ophthalmic emulsion Place 1 drop into both eyes 2 (two) times daily. 05/21/21  Yes [provider]  tretinoin (RETIN-A) 0.025 % cream Apply 1 application topically at bedtime. 02/09/21  Yes [provider]  dicyclomine (BENTYL) 10 MG capsule Take 1 capsule (10 mg total) by mouth 3 (three) times daily as needed for spasms. 05/27/21   Delman Kitten, MD  fluticasone  (FLONASE) 50 MCG/ACT nasal spray Place 2 sprays into both nostrils daily. Patient taking differently: Place 2 sprays into both nostrils daily as needed for allergies. 11/19/20   Scot Jun, FNP  metoprolol tartrate (LOPRESSOR) 25 MG tablet Take 1 tablet (25 mg total) by mouth 3 (three) times daily as needed (for recurrent  palpitations/SVT). Patient not taking: Reported on 05/31/2021 10/09/20   Constance Haw, MD  Shirley Friar 5 % SOLN Apply 1 drop to eye 2 (two) times daily. 12/30/20   [provider]     Objective    Physical Exam: Vitals:   05/31/21 2058 05/31/21 2230 05/31/21 2245 06/01/21 0100  BP: (!) 161/84 (!) 107/58  107/70  Pulse: 80  68 (!) 101  Resp: 14 16 15 13   Temp: 97.6 F (36.4 C)     TempSrc: Oral     SpO2: 100%  97% 96%    General: appears to be stated age; alert, oriented Skin: warm, dry, no rash Head:  AT/[place] Mouth:  Oral mucosa membranes appear moist, normal dentition Neck: supple; trachea midline Heart:  RRR; did not appreciate any M/R/G Lungs: CTAB, did not appreciate any wheezes, rales, or rhonchi Abdomen: + BS; soft, ND, NT Vascular: 2+ pedal pulses b/l; 2+ radial pulses b/l Extremities: no peripheral edema, no muscle wasting Neuro: strength and sensation intact in upper and lower extremities b/l   Labs on Admission: I have personally reviewed following labs and imaging studies  CBC: Recent Labs  Lab 05/27/21 1110 05/31/21 1509 05/31/21 2125  WBC 4.2 5.5 7.7  NEUTROABS  --  3.6 3.9  HGB 13.9 11.8* 14.2  HCT 39.6 33.3* 39.8  MCV 83.9 83.7 82.1  PLT 166 139* 222   Basic Metabolic Panel: Recent Labs  Lab 05/27/21 1110 05/31/21 1509 05/31/21 1709 05/31/21 1823  NA 140 142  --   --   K 4.0 2.6*  --  3.9  CL 106 119*  --   --   CO2 29 20*  --   --   GLUCOSE 100* 71  --   --   BUN 18 16  --   --   CREATININE 0.88 0.58  --   --   CALCIUM 10.3 7.1*  --   --   MG  --   --  1.5*  --    GFR: Estimated Creatinine Clearance:  65.3 mL/min (by C-G formula based on SCr of 0.58 mg/dL). Liver Function Tests:  Recent Labs  Lab 05/27/21 1110  AST 25  ALT 29  ALKPHOS 67  BILITOT 0.7  PROT 7.0  ALBUMIN 4.4   Recent Labs  Lab 05/27/21 1110  LIPASE 35   No results for input(s): AMMONIA in the last 168 hours. Coagulation Profile: No results for input(s): INR, PROTIME in the last 168 hours. Cardiac Enzymes: No results for input(s): CKTOTAL, CKMB, CKMBINDEX, TROPONINI in the last 168 hours. BNP (last 3 results) No results for input(s): PROBNP in the last 8760 hours. HbA1C: No results for input(s): HGBA1C in the last 72 hours. CBG: Recent Labs  Lab 05/31/21 1552  GLUCAP 94   Lipid Profile: No results for input(s): CHOL, HDL, LDLCALC, TRIG, CHOLHDL, LDLDIRECT in the last 72 hours. Thyroid Function Tests: Recent Labs    05/31/21 2226  TSH 1.008   Anemia Panel: No results for input(s): VITAMINB12, FOLATE, FERRITIN, TIBC, IRON, RETICCTPCT in the last 72 hours. Urine analysis:    Component Value Date/Time   COLORURINE YELLOW 05/27/2021 1111   APPEARANCEUR CLEAR 05/27/2021 1111   APPEARANCEUR Hazy (A) 03/21/2021 0924   LABSPEC <1.005 (L) 05/27/2021 1111   PHURINE 6.5 05/27/2021 1111   GLUCOSEU NEGATIVE 05/27/2021 1111   HGBUR TRACE (A) 05/27/2021 1111   BILIRUBINUR NEGATIVE 05/27/2021 1111   BILIRUBINUR Negative 03/21/2021 0924   KETONESUR NEGATIVE 05/27/2021 1111   PROTEINUR NEGATIVE 05/27/2021 1111   UROBILINOGEN 0.2 04/27/2015 1940   UROBILINOGEN 0.2 03/05/2014 1343   NITRITE NEGATIVE 05/27/2021 1111   LEUKOCYTESUR NEGATIVE 05/27/2021 1111    Radiological Exams on Admission: No results found.   EKG: Independently reviewed, with result as described above.    Assessment/Plan    Principal Problem:   Generalized weakness Active Problems:   Hypothyroidism   Hyperlipidemia   Hypokalemia   Hypomagnesemia   Dehydration   Acute prerenal azotemia     #) Generalized weakness: 1 week  duration of generalized weakness, in the absence of any evidence of acute focal neurologic deficits, including no evidence of acute focal weakness. Has resulted in new decline in pt's ability to contribute to ADL completion relative to baseline. Suspect contribution from physiologic stress stemming from presenting dehydration in the setting of clinical evidence to suggest such, as further detailed below.  No e/o additional infectious process at this time, including negative COVID-19/influenza PCR.  However, will further assess for any underlying infectious contribution by also checking urinalysis and chest x-ray.  In terms of other considered etiologies, acute ischemic CVA is felt to be less likely at this time in absence of any associated acute focal neuro deficits. Will further eval for any additional contributions from endocrine/metabolic sources, as detailed below.    Plan: work-up and management of presenting dehydration, as described above, including gentle IV fluids. PT/OT consults ordered for the AM. Fall precautions. Check TSH, MMA. CMP/CBC in the AM.  Check urinalysis, chest x-ray.       #) Hypokalemia: Serum potassium level 2.6.  Status post a total of 70 mill equivalents of supplemental potassium administered in the ED this evening.  Appears to be complicated by concomitant hypomagnesemia, as further detailed below.  Plan: Repeat BMP in the morning.  Further evaluation management of concomitant hypomagnesemia, as further detailed below.  Repeat serum magnesium level in the morning.  Monitor on telemetry.        #) Hypomagnesemia: Presenting serum magnesium level found to be low at 1.5, representing a complicating factor in attempts to supplement concomitant hypokalemia, which is also identified  at the time of this evening's presentation.  Status post 2 g of magnesium sulfate administered intravenously over 2 hours x 1 dose in the ED this evening.  Plan: Repeat serum magnesium  level in the morning.  Monitor on telemetry.       #) Dehydration: Clinical suspicion for such, including the appearance of dry oral mucous membranes as well as laboratory findings notable for acute prerenal azotemia.  Appears to be in the setting of   recent decline in oral intake, but in the absence of any associated increase in GI losses.  No e/o associated hypotension.   Plan: Monitor strict I's and O's.  Daily weights.  Repeat BMP in the morning. IVF's in form of lactated Ringer's at 50 cc an hour x8 hours.  Check urinalysis.         #) acquired hypothyroidism: documented h/o such, on Synthroid as outpatient.   Plan: cont home Synthroid.  In the setting of presenting generalized weakness, also check TSH level.        #) Hyperlipidemia: documented h/o such. On pravastatin as well as Zetia as outpatient.    Plan: continue home statin in addition to home Zetia.      DVT prophylaxis: SCD's   Code Status: Full code Family Communication: none Disposition Plan: Per Rounding Team Consults called: none;  Admission status: Observation; med telemetry    PLEASE NOTE THAT DRAGON DICTATION SOFTWARE WAS USED IN THE CONSTRUCTION OF THIS NOTE.   [place] DO [facility] [place]   06/01/2021, 2:10 AM

## 2021-06-01 NOTE — ED Notes (Signed)
Per PT orthostatic vital signs were as follows:  102/70 lying 130/94 Sitting 127/94 standing 121/109 3 min stand 136/96 after walking

## 2021-06-01 NOTE — Discharge Summary (Signed)
Physician Discharge Summary   Patient: [name] MRN: [id number] DOB: 1950/06/19  Admit date:     05/31/2021  Discharge date: 06/01/21  Discharge Physician: Antonieta Pert   PCP: Cari Caraway, MD   Recommendations at discharge:   Follow-up with rheumatology number given.  Follow-up with PCP GI  Hospital Course: 35 F w/ h/o acquired hypothyroidism, hyperlipidemia, SVT with history of ablation in 2001 presented to [facility] ED with generalized weakness of 1 week, difficulty performing ADLs. Seen in the ED vitals stable Labs reviewed severe hypokalemia, hypomagnesemia troponin negative x2 otherwise no acute finding at chest x-ray-no acute finding, UA ordered.  Electrolytes replaced and admitted for overnight for dehydration electrolyte imbalance AND MILD Aki. Next morning orthostatic vitals negative.  Electrolytes have normalized. This morning no new complaints agreeable for discharge home.  She did endorse ongoing weakness myopathy-we will plan to see rheumatology soon.  I have provided additional rheumatology number for her follow-up sooner per request   Discharge Diagnoses: * Generalized weakness Likely in the setting of dehydration and electrolyte imbalance.  Obtain PT OT eval for disposition-no further PT OT needs.  Patient to be discharged home.  She will have follow-up with rheumatology also provide additional rheumatology number given her complaint of several weeks of weakness/myopathy  Acute prerenal azotemia- (present on admission) AKI has resolved.  Encourage oral intake  Dehydration- (present on admission) Hydrated with IV fluids.  Encourage oral intake  Hypomagnesemia- (present on admission) Resolved  Hypokalemia- (present on admission) Resolved  Hyperlipidemia- (present on admission) Continue home pravastatin and Zetia  Hypothyroidism- (present on admission) Continue home Synthroid.  TSH stable at 1.0      Consultants: none Procedures performed: none   Disposition: home Diet recommendation:  Diet Orders (From admission, onward)     Start     Ordered   05/31/21 2230  Diet regular Room service appropriate? Yes; Fluid consistency: Thin  Diet effective now       Question Answer Comment  Room service appropriate? Yes   Fluid consistency: Thin      05/31/21 2229             DISCHARGE MEDICATION: Allergies as of 06/01/2021       Reactions   Diphenhydramine Palpitations   Other reaction(s): tachycardia   Epinephrine Palpitations   Lipitor [atorvastatin] Other (See Comments)   Muscle aches   Praluent [alirocumab]    runny nose, sinus congestion, myalgias, UTI, weight gain   Repatha [evolocumab]    Joint pain        Medication List     STOP taking these medications    metoprolol tartrate 25 MG tablet Commonly known as: LOPRESSOR       TAKE these medications    dicyclomine 10 MG capsule Commonly known as: BENTYL Take 1 capsule (10 mg total) by mouth 3 (three) times daily as needed for spasms.   ezetimibe 10 MG tablet Commonly known as: ZETIA Take 10 mg by mouth at bedtime.   fluticasone 50 MCG/ACT nasal spray Commonly known as: FLONASE Place 2 sprays into both nostrils daily. What changed:  when to take this reasons to take this   ibuprofen 200 MG tablet Commonly known as: ADVIL Take 400-600 mg by mouth daily as needed for moderate pain.   levothyroxine 88 MCG tablet Commonly known as: SYNTHROID Take 88 mcg by mouth daily.   Lotemax SM 0.38 % Gel Generic drug: Loteprednol Etabonate Place 1 drop into both eyes as directed. 1 drop QID for  7 Days, 1 drop TID for 7 Days, 1 drop BID for 7 Days, 1 drop Daily for 7 Days   metoprolol succinate 50 MG 24 hr tablet Commonly known as: TOPROL-XL Take 1 tablet (50 mg total) by mouth daily. Take with or immediately following a meal. What changed: when to take this   multivitamin with minerals Tabs tablet Take 1 tablet by mouth daily.   polyethylene glycol  17 g packet Commonly known as: MIRALAX / GLYCOLAX Take 17 g by mouth daily as needed for mild constipation.   pravastatin 20 MG tablet Commonly known as: PRAVACHOL Take 1 tablet (20 mg total) by mouth every evening.   PROBIOTIC ADVANCED PO Take 1 capsule by mouth daily.   Refresh 1.4-0.6 % Soln Generic drug: Polyvinyl Alcohol-Povidone PF Apply 1 drop to eye 3 (three) times daily as needed (for dry eyes).   Restasis 0.05 % ophthalmic emulsion Generic drug: cycloSPORINE Place 1 drop into both eyes 2 (two) times daily.   tretinoin 0.025 % cream Commonly known as: RETIN-A Apply 1 application topically at bedtime.   Vitamin D-3 125 MCG (5000 UT) Tabs Take 5,000 Units by mouth See admin instructions. Monday and Friday What changed: how much to take   Xiidra 5 % Soln Generic drug: Lifitegrast Apply 1 drop to eye 2 (two) times daily.        Follow-up Information     Cari Caraway, MD Follow up in 1 week(s).   Specialty: Family Medicine Contact information: [place] [address] [phone]         Sueanne Margarita, MD .   Specialty: Cardiology Contact information: [phone] [address] [place] [address] [phone]         Constance Haw, MD .   Specialty: Cardiology Contact information: [address] [place] [address] [phone]         Lahoma Rocker, MD. Schedule an appointment as soon as possible for a visit.   Specialty: Rheumatology Contact information: [address] [place] [address] [phone]                 Discharge Exam: Danley Danker Weights   06/01/21 0754  Weight: 77.1 kg   Condition at discharge: stable  The results of significant diagnostics from this hospitalization (including imaging, microbiology, ancillary and laboratory) are listed below for reference.   Imaging Studies: DG Chest 2 View  Result Date: 05/27/2021 CLINICAL DATA:  71 year old female  with right lower chest or upper quadrant pain for 2 weeks. Weakness and palpitations this morning. EXAM: CHEST - 2 VIEW COMPARISON:  Chest radiographs 02/19/2021 and earlier. FINDINGS: Lung volumes and mediastinal contours remain normal. Visualized tracheal air column is within normal limits. Both lungs appear stable and clear. No pneumothorax or pleural effusion. No pneumoperitoneum. Negative visible bowel gas. No acute osseous abnormality identified. IMPRESSION: Stable, negative.  No acute cardiopulmonary abnormality. Electronically Signed   By: Genevie Ann M.D.   On: 05/27/2021 11:35   CT ABDOMEN PELVIS W CONTRAST  Result Date: 05/27/2021 CLINICAL DATA:  Abdominal pain. EXAM: CT ABDOMEN AND PELVIS WITH CONTRAST TECHNIQUE: Multidetector CT imaging of the abdomen and pelvis was performed using the standard protocol following bolus administration of intravenous contrast. RADIATION DOSE REDUCTION: This exam was performed according to the departmental dose-optimization program which includes automated exposure control, adjustment of the mA and/or kV according to patient size and/or use of iterative reconstruction technique. CONTRAST:  165mL  OMNIPAQUE IOHEXOL 300 MG/ML  SOLN COMPARISON:  CT abdomen dated 10/15/2019 FINDINGS: Lower chest: No acute abnormality. Hepatobiliary: Stable small hepatic cysts. No new or suspicious findings within the liver. Gallbladder appears normal. No bile duct dilatation seen. Pancreas: Unremarkable. No pancreatic ductal dilatation or surrounding inflammatory changes. Spleen: Normal in size without focal abnormality. Adrenals/Urinary Tract: Adrenal glands appear normal. Kidneys are unremarkable without mass, stone or hydronephrosis. No perinephric fluid. No ureteral or bladder calculi are identified. Bladder appears normal, partially decompressed. Stomach/Bowel: No dilated large or small bowel loops. No evidence of bowel wall inflammation. Appendix is not convincingly seen but there are no  inflammatory changes about the cecum to suggest acute appendicitis. Stomach is unremarkable. Vascular/Lymphatic: Aortic atherosclerosis. No abdominal aortic aneurysm. No acute-appearing vascular abnormality. No enlarged lymph nodes seen in the abdomen or pelvis. Reproductive: Leiomyomatous uterus. No adnexal mass or free fluid seen. Other: No free fluid or abscess collection seen. No free intraperitoneal air. Musculoskeletal: Degenerative spondylosis within the lower lumbar spine, at least moderate in degree at the lumbosacral junction with associated disc desiccation and articular surface sclerosis. No acute-appearing osseous abnormality. IMPRESSION: 1. No acute findings within the abdomen or pelvis. No bowel obstruction or evidence of bowel wall inflammation. No evidence of acute solid organ abnormality. No renal or ureteral calculi. 2. Leiomyomatous uterus. 3. Degenerative spondylosis within the lower lumbar spine, at least moderate in degree at the lumbosacral junction. Aortic Atherosclerosis (ICD10-I70.0). Electronically Signed   By: Franki Cabot M.D.   On: 05/27/2021 14:03   DG Chest Port 1 View  Result Date: 06/01/2021 CLINICAL DATA:  Dizziness, weakness EXAM: PORTABLE CHEST 1 VIEW COMPARISON:  05/27/2021 FINDINGS: Lungs are clear.  No pleural effusion or pneumothorax. The heart is normal in size. IMPRESSION: No evidence of acute cardiopulmonary disease. Electronically Signed   By: Julian Hy M.D.   On: 06/01/2021 02:24    Microbiology: Results for orders placed or performed during the hospital encounter of 05/31/21  Resp Panel by RT-PCR (Flu A&B, Covid) Nasopharyngeal Swab     Status: None   Collection Time: 05/31/21  9:25 PM   Specimen: Nasopharyngeal Swab; Nasopharyngeal(NP) swabs in vial transport medium  Result Value Ref Range Status   SARS Coronavirus 2 by RT PCR NEGATIVE NEGATIVE Final    Comment: (NOTE) SARS-CoV-2 target nucleic acids are NOT DETECTED.  The SARS-CoV-2 RNA is  generally detectable in upper respiratory specimens during the acute phase of infection. The lowest concentration of SARS-CoV-2 viral copies this assay can detect is 138 copies/mL. A negative result does not preclude SARS-Cov-2 infection and should not be used as the sole basis for treatment or other patient management decisions. A negative result may occur with  improper specimen collection/handling, submission of specimen other than nasopharyngeal swab, presence of viral mutation(s) within the areas targeted by this assay, and inadequate number of viral copies(<138 copies/mL). A negative result must be combined with clinical observations, patient history, and epidemiological information. The expected result is Negative.  Fact Sheet for Patients:  EntrepreneurPulse.com.au  Fact Sheet for Healthcare Providers:  IncredibleEmployment.be  This test is no t yet approved or cleared by the [place] FDA and  has been authorized for detection and/or diagnosis of SARS-CoV-2 by FDA under an Emergency Use Authorization (EUA). This EUA will remain  in effect (meaning this test can be used) for the duration of the COVID-19 declaration under Section 564(b)(1) of the Act, 21 U.S.C.section 360bbb-3(b)(1), unless the authorization is terminated  or revoked sooner.  Influenza A by PCR NEGATIVE NEGATIVE Final   Influenza B by PCR NEGATIVE NEGATIVE Final    Comment: (NOTE) The Xpert Xpress SARS-CoV-2/FLU/RSV plus assay is intended as an aid in the diagnosis of influenza from Nasopharyngeal swab specimens and should not be used as a sole basis for treatment. Nasal washings and aspirates are unacceptable for Xpert Xpress SARS-CoV-2/FLU/RSV testing.  Fact Sheet for Patients: EntrepreneurPulse.com.au  Fact Sheet for Healthcare Providers: IncredibleEmployment.be  This test is not yet approved or cleared by the [place] FDA and has been authorized for detection and/or diagnosis of SARS-CoV-2 by FDA under an Emergency Use Authorization (EUA). This EUA will remain in effect (meaning this test can be used) for the duration of the COVID-19 declaration under Section 564(b)(1) of the Act, 21 U.S.C. section 360bbb-3(b)(1), unless the authorization is terminated or revoked.  Performed at [facility], [place] [address], [place], [place] [address]     Labs: CBC: Recent Labs  Lab 05/27/21 1110 05/31/21 1509 05/31/21 2125 06/01/21 0500  WBC 4.2 5.5 7.7 6.5  NEUTROABS  --  3.6 3.9 3.1  HGB 13.9 11.8* 14.2 14.4  HCT 39.6 33.3* 39.8 39.7  MCV 83.9 83.7 82.1 83.1  PLT 166 139* 168 291   Basic Metabolic Panel: Recent Labs  Lab 05/27/21 1110 05/31/21 1509 05/31/21 1709 05/31/21 1823 06/01/21 0500  NA 140 142  --   --  140  K 4.0 2.6*  --  3.9 4.0  CL 106 119*  --   --  109  CO2 29 20*  --   --  24  GLUCOSE 100* 71  --   --  89  BUN 18 16  --   --  17  CREATININE 0.88 0.58  --   --  0.93  CALCIUM 10.3 7.1*  --   --  10.3  MG  --   --  1.5*  --  2.5*   Liver Function Tests: Recent Labs  Lab 05/27/21 1110 06/01/21 0500  AST 25 17  ALT 29 20  ALKPHOS 67 63  BILITOT 0.7 0.7  PROT 7.0 6.6  ALBUMIN 4.4 4.3   CBG: Recent Labs  Lab 05/31/21 1552  GLUCAP 94    Discharge time spent: 25 minutes.  Signed: Antonieta Pert, MD [facility] 06/01/2021

## 2021-06-01 NOTE — Assessment & Plan Note (Signed)
Hydrated with IV fluids.  Encourage oral intake

## 2021-06-02 LAB — T4: T4, Total: 8.9 ug/dL (ref 4.5–12.0)

## 2021-06-04 ENCOUNTER — Encounter: Payer: Self-pay | Admitting: Sports Medicine

## 2021-06-05 ENCOUNTER — Ambulatory Visit: Payer: Medicare HMO | Admitting: Internal Medicine

## 2021-06-05 ENCOUNTER — Encounter: Payer: Self-pay | Admitting: Internal Medicine

## 2021-06-05 VITALS — BP 138/74 | HR 62 | Ht 63.0 in | Wt 168.0 lb

## 2021-06-05 DIAGNOSIS — R1011 Right upper quadrant pain: Secondary | ICD-10-CM | POA: Diagnosis not present

## 2021-06-05 NOTE — Patient Instructions (Signed)
If you are age 70 or older, your body mass index should be between 23-30. Your Body mass index is 29.76 kg/m. If this is out of the aforementioned range listed, please consider follow up with your Primary Care Provider.  If you are age 57 or younger, your body mass index should be between 19-25. Your Body mass index is 29.76 kg/m. If this is out of the aformentioned range listed, please consider follow up with your Primary Care Provider.   You have been scheduled for an abdominal ultrasound at [facility] Radiology (1st floor of hospital) on 06/11/21 at 9:30am. Please arrive 30 minutes prior to your appointment for registration. Make certain not to have anything to eat or drink 6 hours prior to your appointment. Should you need to reschedule your appointment, please contact radiology at [phone]. This test typically takes about 30 minutes to perform.  The [facility] providers would like to encourage you to use [facility] to communicate with providers for non-urgent requests or questions.  Due to long hold times on the telephone, sending your provider a message by [facility] may be a faster and more efficient way to get a response.  Please allow 48 business hours for a response.  Please remember that this is for non-urgent requests.   It was a pleasure to see you today!  Thank you for trusting me with your gastrointestinal care!    Christia Reading, MD

## 2021-06-05 NOTE — Progress Notes (Signed)
Chief Complaint: RUQ ab pain  HPI : 71 year old female with history of SVT, hypothyroidism presents with RUQ ab pain  She has been having issues with constipation, dry mouth, and fatigue, which have been attributed to her thyroid. She recently had her thyroid medication dosage adjusted. She has been dealing with ab pain for the last 4 weeks. This feels like a gas and sharp pain that only occurs in the morning. She has the ab pain every morning. She has not noticed that ab pain is been related to her eating habits or her bowel habits. She used to take probiotics and never had any issues with her bowel habits. However, more recently she would never feel like she was fully emptying her bowels. She is having at least one BM per day currently but this has been related to her trying to actively drink more fluids. She was on Linzess for 3 days about 2.5 weeks ago but this was making her more dehydrated. Has had a couple of bouts of nausea. Denies vomiting. Denies dysphagia, odynophagia, weight loss, chest burning, regurgitation. She has taken Miralax PRN when she has issues with constipation, which has helped. Father had colon polyps. Denies fam hx of GI cnacers. Last colonoscopy was 2020 was one precancerous polyps. Last EGD that was done was normal in the past. She is not taking ibuprofen.  She has retired Therapist, sports   Past Medical History:  Diagnosis Date   Arthritis    Neck   CIN I (cervical intraepithelial neoplasia I) 3354   Complication of anesthesia    delayed SVT:  can occur from a few hours to 3 days after the general anesthesia or conscious sedation as happened with her colonoscopy.    Dysplasia of cervix, low grade (CIN 1)    LEEP cervical conization September 2017 margins negative CIN-1   GERD (gastroesophageal reflux disease)    Hashimoto's thyroiditis    History of carpal tunnel syndrome    Left   History of colon polyps 2010   Benign   Hyperlipidemia    Hypothyroidism    Hashimoto's per  pt    Osteopenia 10/2017   T score -1.9 FRAX 8.8% / 0.7%.  Stable from prior DEXA.   SVT (supraventricular tachycardia) ([employer])    Wears glasses      Past Surgical History:  Procedure Laterality Date   BREAST SURGERY  1976   CYST REMOVED   CARDIAC ELECTROPHYSIOLOGY STUDY AND ABLATION  2001   CESAREAN SECTION  1987   COLONOSCOPY     COLONOSCOPY WITH PROPOFOL N/A 02/12/2019   Procedure: COLONOSCOPY WITH PROPOFOL;  Surgeon: Wonda Horner, MD;  Location: [facility] ENDOSCOPY;  Service: Endoscopy;  Laterality: N/A;   LEEP  2017   left thumb surgery and Carpal Tunnel Release  9/14   lower lid surgery   09/2018   dry eye from Hashimoto's   POLYPECTOMY  02/12/2019   Procedure: POLYPECTOMY;  Surgeon: Wonda Horner, MD;  Location: [facility] ENDOSCOPY;  Service: Endoscopy;;   Family History  Problem Relation Age of Onset   Hypertension Mother    Heart disease Mother    Mitral valve prolapse Mother    Glaucoma Mother    Arthritis Mother    Von Willebrand disease Mother    Stroke Mother    Rheum arthritis Mother    Neuropathy Mother    Hypertension Father    Heart disease Father    Colon polyps Father    Glaucoma Father  Arthritis Father    Stroke Father    Rheum arthritis Father    Pulmonary embolism Father    Hypertension Brother    Mitral valve prolapse Brother    Glaucoma Brother    Arthritis Brother    Arthritis Sister    Social History   Tobacco Use   Smoking status: Never   Smokeless tobacco: Never  Vaping Use   Vaping Use: Never used  Substance Use Topics   Alcohol use: Not Currently   Drug use: No   Current Outpatient Medications  Medication Sig Dispense Refill   Cholecalciferol (VITAMIN D-3) 125 MCG (5000 UT) TABS Take 5,000 Units by mouth See admin instructions. Monday and Friday 30 tablet    dicyclomine (BENTYL) 10 MG capsule Take 1 capsule (10 mg total) by mouth 3 (three) times daily as needed for spasms. 21 capsule 0   ezetimibe (ZETIA) 10 MG tablet Take 10 mg by  mouth at bedtime.     fluticasone (FLONASE) 50 MCG/ACT nasal spray Place 2 sprays into both nostrils daily. (Patient taking differently: Place 2 sprays into both nostrils daily as needed for allergies.) 16 g 0   ibuprofen (ADVIL) 200 MG tablet Take 400-600 mg by mouth daily as needed for moderate pain.     levothyroxine (SYNTHROID) 75 MCG tablet Take 75 mcg by mouth daily before breakfast.     LOTEMAX SM 0.38 % GEL Place 1 drop into both eyes as directed. 1 drop QID for 7 Days, 1 drop TID for 7 Days, 1 drop BID for 7 Days, 1 drop Daily for 7 Days     metoprolol succinate (TOPROL-XL) 50 MG 24 hr tablet Take 1 tablet (50 mg total) by mouth daily. Take with or immediately following a meal. (Patient taking differently: Take 50 mg by mouth at bedtime. Take with or immediately following a meal.) 90 tablet 3   Multiple Vitamin (MULTIVITAMIN WITH MINERALS) TABS tablet Take 1 tablet by mouth daily.     polyethylene glycol (MIRALAX / GLYCOLAX) packet Take 17 g by mouth daily as needed for mild constipation.      Polyvinyl Alcohol-Povidone PF (REFRESH) 1.4-0.6 % SOLN Apply 1 drop to eye 3 (three) times daily as needed (for dry eyes).     pravastatin (PRAVACHOL) 20 MG tablet Take 1 tablet (20 mg total) by mouth every evening. 30 tablet 11   Probiotic Product (PROBIOTIC ADVANCED PO) Take 1 capsule by mouth daily.      RESTASIS 0.05 % ophthalmic emulsion Place 1 drop into both eyes 2 (two) times daily.     No current facility-administered medications for this visit.   Allergies  Allergen Reactions   Diphenhydramine Palpitations    Other reaction(s): tachycardia   Epinephrine Palpitations   Lipitor [Atorvastatin] Other (See Comments)    Muscle aches   Praluent [Alirocumab]     runny nose, sinus congestion, myalgias, UTI, weight gain   Repatha [Evolocumab]     Joint pain     Review of Systems: All systems reviewed and negative except where noted in HPI.   Physical Exam: BP 138/74    Pulse 62    Ht  5\' 3"  (1.6 m)    Wt 168 lb (76.2 kg)    LMP 11/26/2000    BMI 29.76 kg/m  Constitutional: Pleasant,well-developed, female in no acute distress. HEENT: Normocephalic and atraumatic. Conjunctivae are normal. No scleral icterus. Cardiovascular: Normal rate, regular rhythm.  Pulmonary/chest: Effort normal and breath sounds normal. No wheezing, rales or rhonchi.  Abdominal: Soft, nondistended, tender in scattered areas around the abdomen. Bowel sounds active throughout. There are no masses palpable. No hepatomegaly. Extremities: No edema Neurological: Alert and oriented to person place and time. Skin: Skin is warm and dry. No rashes noted. Psychiatric: Normal mood and affect. Behavior is normal.  Labs 06/01/21: CBC unremarkable. CMP nml. TSH nml. Lipase nml.  CT A/P 05/27/21: IMPRESSION: 1. No acute findings within the abdomen or pelvis. No bowel obstruction or evidence of bowel wall inflammation. No evidence of acute solid organ abnormality. No renal or ureteral calculi. 2. Leiomyomatous uterus. 3. Degenerative spondylosis within the lower lumbar spine, at least moderate in degree at the lumbosacral junction.  Colonoscopy 02/12/19: - One 8 mm polyp in the cecum, removed with a hot snare. Resected and retrieved. - Internal hemorrhoids. Path: A. COLON, CECUM POLYP, BIOPSY:  - Tubular adenoma.  - No high-grade dysplasia or carcinoma.   ASSESSMENT AND PLAN:  RUQ ab pain At this time unclear exactly what is causing her right upper quadrant abdominal pain.  Etiologies could include gallstones, constipation, GERD, PUD, SIBO, or MSK related pain.  We will plan to start off with right upper quadrant ultrasound and daily MiraLAX therapy to see if this helps with her symptoms.  Her prior work-up including LFTs, lipase, and CT A/P did not reveal any obvious source of abdominal discomfort. - Start Miralax QD - RUQ U/S - RTC 2 months. Consider evaluation for SIBO and trial of PPI therapy in the  future  Christia Reading, MD

## 2021-06-06 ENCOUNTER — Encounter: Payer: Self-pay | Admitting: Cardiology

## 2021-06-07 ENCOUNTER — Encounter
Admit: 2021-06-07 | Payer: PRIVATE HEALTH INSURANCE | Attending: Vascular and Interventional Radiology | Primary: Family Medicine

## 2021-06-07 ENCOUNTER — Encounter: Payer: Self-pay | Admitting: Cardiology

## 2021-06-07 ENCOUNTER — Ambulatory Visit (HOSPITAL_BASED_OUTPATIENT_CLINIC_OR_DEPARTMENT_OTHER): Payer: Medicare HMO | Admitting: Physical Therapy

## 2021-06-07 ENCOUNTER — Other Ambulatory Visit: Payer: Self-pay

## 2021-06-07 ENCOUNTER — Ambulatory Visit: Payer: Medicare HMO | Admitting: Cardiology

## 2021-06-07 VITALS — BP 140/80 | HR 71 | Ht 63.0 in | Wt 161.4 lb

## 2021-06-07 DIAGNOSIS — M542 Cervicalgia: Secondary | ICD-10-CM

## 2021-06-07 DIAGNOSIS — M545 Low back pain, unspecified back pain laterality, unspecified chronicity, unspecified whether sciatica present: Secondary | ICD-10-CM

## 2021-06-07 DIAGNOSIS — I10 Essential (primary) hypertension: Secondary | ICD-10-CM

## 2021-06-07 DIAGNOSIS — R011 Cardiac murmur, unspecified: Secondary | ICD-10-CM

## 2021-06-07 DIAGNOSIS — T466X5D Adverse effect of antihyperlipidemic and antiarteriosclerotic drugs, subsequent encounter: Secondary | ICD-10-CM

## 2021-06-07 DIAGNOSIS — I471 Supraventricular tachycardia: Secondary | ICD-10-CM | POA: Diagnosis not present

## 2021-06-07 DIAGNOSIS — G720 Drug-induced myopathy: Secondary | ICD-10-CM | POA: Diagnosis not present

## 2021-06-07 DIAGNOSIS — E785 Hyperlipidemia, unspecified: Secondary | ICD-10-CM

## 2021-06-07 LAB — METHYLMALONIC ACID, SERUM: Methylmalonic Acid, Quantitative: 107 nmol/L (ref 0–378)

## 2021-06-07 MED ORDER — LOSARTAN POTASSIUM 25 MG PO TABS: 25.0000 mg | ORAL_TABLET | Freq: Every day | ORAL | 2 refills | Status: DC

## 2021-06-07 NOTE — Progress Notes (Signed)
Cardiology Office Note:    Date:  06/07/2021   ID:  [name], DOB 28-Aug-1950, MRN [id number]  PCP:  Cari Caraway, [place]  Cardiologist:  Fransico Him, MD  Advanced Practice Provider:  No care team member to display Electrophysiologist:  Will Meredith Leeds, MD    Referring MD: Cari Caraway, MD    History of Present Illness:    [name] is a 71 y.o. female with a hx of HTN, HLD, paroxysmal SVT s/p ablation, and hypothyroidism who was previously followed by Dr. [name] who presents to for CV follow-up.  The patient has a long history of paroxysmal SVT. Underwent ablation for SVT at [facility] in 2001.  Since then she has had infrequent episodes of SVT.  Many of her episodes have occurred following surgical procedures after administration of anesthesia.  She has had an echocardiogram on 02/02/13 which showed normal left ventricular function with ejection fraction 55-60% and no significant abnormalities. Cardiac monitor 12/17/2017 showed normal sinus rhythm, sinus bradycardia, sinus tachycardia with some nonsustained atrial tachycardia up to 13 beats and occasional PVCs with PVC burden of 1%.  Heart rate ranged from 42 to 185 bpm. Has been followed by Dr. Curt Bears with last visit in 2020 where she was doing well and was continued on her metoprolol.  During visit on 06/19/20, the patient was having elevated blood pressures running 130s-170s. Also had episode of SVT in 04/2020. Following our visit on 06/23/20, she called the on-call line due to HR 142. She was instructed to take additional doses of her metop at that time. She was seen in EP clinic on 06/30/20 where she was recommended for possible ablation if symptoms recurred.  During visit on 09/21/20, the patient had another episode of SVT after a dental procedure.She went to [facility] where HR were mildly elevated at that time. Received IVF and toradol with resolution of  symptoms and was discharged home. Ca score was also obtained on 10/31/20 which was 0.   During last visit on 03/23/21, the patient was doing well from a CV perspective. She did not have recurrent SVT since previous appointment. However, she did complain of chronic back and neck pain. She wanted to avoid surgical intervention and was referred to sports medicine.   Today, she is not doing the best. Since her last visit, she had been in the ED multiple times. She has been having autoimmune symptoms of mouth dryness, eye redness, sudden fatigue, and numbness in her R leg. 3 weeks, she started having constipation and was started on Linzess. However, the Linzess caused her to feel poorly and she stopped it after 3 days. She takes Miralax which improves the constipation. She has been trying to find a rheumatologist. She reports the test for Sjogren's syndrome was negative.   Her blood pressure fluctuates. When her pressure is high, she hears the pounding in her ear and feels like she is going to pass out. One episode of presenting to the ED, her diastolic was 782.   She continues to have SVT episodes. Typically, the episodes last a few minutes and do not go above 120 bpm. Last Thursday, in the ED, her episode lasted for 15 minutes. Yesterday, she felt the SVT episode for hours. Her blood pressure at this time was 140/90. She has been attending [employer] for several months. Recently, while she was exercising, emergency services had to be contacted because her blood pressure increased to 956 systolic.   The  patient denies chest pain, chest pressure, dyspnea at rest or with exertion, PND, orthopnea, or leg swelling. Denies cough, fever, chills. Denies syncope or presyncope. Denies snoring.  Past Medical History:  Diagnosis Date   Arthritis    Neck   CIN I (cervical intraepithelial neoplasia I) 7829   Complication of anesthesia    delayed SVT:  can occur from a few hours to 3 days after the general  anesthesia or conscious sedation as happened with her colonoscopy.    Dysplasia of cervix, low grade (CIN 1)    LEEP cervical conization September 2017 margins negative CIN-1   GERD (gastroesophageal reflux disease)    Hashimoto's thyroiditis    History of carpal tunnel syndrome    Left   History of colon polyps 2010   Benign   Hyperlipidemia    Hypothyroidism    Hashimoto's per pt    Osteopenia 10/2017   T score -1.9 FRAX 8.8% / 0.7%.  Stable from prior DEXA.   SVT (supraventricular tachycardia) ([place])    Wears glasses     Past Surgical History:  Procedure Laterality Date   BREAST SURGERY  1976   CYST REMOVED   CARDIAC ELECTROPHYSIOLOGY STUDY AND ABLATION  2001   CESAREAN SECTION  1987   COLONOSCOPY     COLONOSCOPY WITH PROPOFOL N/A 02/12/2019   Procedure: COLONOSCOPY WITH PROPOFOL;  Surgeon: Wonda Horner, MD;  Location: [facility] ENDOSCOPY;  Service: Endoscopy;  Laterality: N/A;   LEEP  2017   left thumb surgery and Carpal Tunnel Release  9/14   lower lid surgery   09/2018   dry eye from Hashimoto's   POLYPECTOMY  02/12/2019   Procedure: POLYPECTOMY;  Surgeon: Wonda Horner, MD;  Location: [facility] ENDOSCOPY;  Service: Endoscopy;;    Current Medications: Current Meds  Medication Sig   Cholecalciferol (VITAMIN D-3) 125 MCG (5000 UT) TABS Take 5,000 Units by mouth See admin instructions. Monday and Friday   dicyclomine (BENTYL) 10 MG capsule Take 1 capsule (10 mg total) by mouth 3 (three) times daily as needed for spasms.   ezetimibe (ZETIA) 10 MG tablet Take 10 mg by mouth at bedtime.   fluticasone (FLONASE) 50 MCG/ACT nasal spray Place 2 sprays into both nostrils daily.   ibuprofen (ADVIL) 200 MG tablet Take 400-600 mg by mouth daily as needed for moderate pain.   levothyroxine (SYNTHROID) 75 MCG tablet Take 75 mcg by mouth daily before breakfast.   losartan (COZAAR) 25 MG tablet Take 1 tablet (25 mg total) by mouth daily.   LOTEMAX SM 0.38 % GEL Place 1 drop into both eyes as  directed. 1 drop QID for 7 Days, 1 drop TID for 7 Days, 1 drop BID for 7 Days, 1 drop Daily for 7 Days   metoprolol succinate (TOPROL-XL) 50 MG 24 hr tablet Take 1 tablet (50 mg total) by mouth daily. Take with or immediately following a meal.   Multiple Vitamin (MULTIVITAMIN WITH MINERALS) TABS tablet Take 1 tablet by mouth daily.   polyethylene glycol (MIRALAX / GLYCOLAX) packet Take 17 g by mouth daily as needed for mild constipation.    Polyvinyl Alcohol-Povidone PF (REFRESH) 1.4-0.6 % SOLN Apply 1 drop to eye 3 (three) times daily as needed (for dry eyes).   pravastatin (PRAVACHOL) 20 MG tablet Take 1 tablet (20 mg total) by mouth every evening.   Probiotic Product (PROBIOTIC ADVANCED PO) Take 1 capsule by mouth daily.    RESTASIS 0.05 % ophthalmic emulsion Place 1 drop into  both eyes 2 (two) times daily.     Allergies:   Diphenhydramine, Epinephrine, Lipitor [atorvastatin], Praluent [alirocumab], and Repatha [evolocumab]   Social History   Socioeconomic History   Marital status: Widowed    Spouse name: Not on file   Number of children: 1   Years of education: Not on file   Highest education level: Master's degree (e.g., MA, MS, MEng, MEd, MSW, MBA)  Occupational History   Not on file  Tobacco Use   Smoking status: Never   Smokeless tobacco: Never  Vaping Use   Vaping Use: Never used  Substance and Sexual Activity   Alcohol use: Not Currently   Drug use: No   Sexual activity: Not Currently    Partners: Male    Birth control/protection: Post-menopausal    Comment: 71 YEARS OLD, NO MORE THAN 5 PARTNERS ,des neg  Other Topics Concern   Not on file  Social History Narrative   Her daughter lives at home with her    Right handed   Caffeine: coffee, soda, (about 1 cup/day)   Social Determinants of Health   Financial Resource Strain: Not on file  Food Insecurity: Not on file  Transportation Needs: Not on file  Physical Activity: Not on file  Stress: Not on file  Social  Connections: Not on file     Family History: The patient's family history includes Arthritis in her brother, father, mother, and sister; Colon polyps in her father; Glaucoma in her brother, father, and mother; Heart disease in her father and mother; Hypertension in her brother, father, and mother; Mitral valve prolapse in her brother and mother; Neuropathy in her mother; Pulmonary embolism in her father; Rheum arthritis in her father and mother; Stroke in her father and mother; Von Willebrand disease in her mother.  ROS:   Please see the history of present illness.    Review of Systems  Constitutional:  Positive for malaise/fatigue. Negative for chills and fever.  HENT:  Positive for sore throat (Dry Mouth). Negative for hearing loss.   Eyes:  Positive for redness. Negative for blurred vision.  Respiratory:  Negative for shortness of breath.   Cardiovascular:  Positive for palpitations. Negative for chest pain, orthopnea, claudication, leg swelling and PND.  Gastrointestinal:  Positive for constipation. Negative for nausea and vomiting.  Genitourinary:  Negative for dysuria.  Musculoskeletal:  Negative for back pain, falls, joint pain, myalgias and neck pain.  Neurological:  Positive for weakness (RLE). Negative for dizziness and loss of consciousness.  Endo/Heme/Allergies:  Negative for polydipsia.  Psychiatric/Behavioral:  Negative for substance abuse.    EKGs/Labs/Other Studies Reviewed:    The following studies were reviewed today: Ca score 10/31/20: FINDINGS: Coronary Calcium Score:   Left main: 0   Left anterior descending artery: 0   Left circumflex artery: 0   Right coronary artery: 0   Total: 0   Percentile: 0   Pericardium: Normal.   Ascending Aorta: Normal caliber.  Mild aortic atherosclerosis.   Non-cardiac: See separate report from [facility].   IMPRESSION: Coronary calcium score of 0. This was 0 percentile for age-, race-, and sex-matched  controls.   Mild aortic atherosclerosis.  Cardiac monitor 01/26/18 - personally reviewed Normal sinus rhythm, sinus bradycardia and sinus tachycardia with average heart rate 73 bpm. Heart rate ranged from 42 to 185 bpm. Occasional PVCs with PVC burden 1%. Nonsustained atrial tachycardia up to 13 beats.  TTE 2014: ------------------------------------------------------------  Left ventricle:  The cavity size was normal. Systolic  function was normal. The estimated ejection fraction was in  the range of 55% to 60%. Wall motion was normal; there were  no regional wall motion abnormalities.   ------------------------------------------------------------  Aortic valve:   Trileaflet; normal thickness leaflets.  Mobility was not restricted.  Doppler:  Transvalvular  velocity was within the normal range. There was no stenosis.   No regurgitation.   ------------------------------------------------------------  Aorta:  Aortic root: The aortic root was normal in size.   ------------------------------------------------------------  Mitral valve:   Structurally normal valve.   Mobility was  not restricted.  Doppler:  Transvalvular velocity was within  the normal range. There was no evidence for stenosis.  Trivial regurgitation.    Peak gradient: 1mm Hg (D).   ------------------------------------------------------------  Left atrium:  The atrium was normal in size.   ------------------------------------------------------------  Right ventricle:  The cavity size was normal. Wall thickness  was normal. Systolic function was normal.   ------------------------------------------------------------  Pulmonic valve:    Doppler:  Transvalvular velocity was  within the normal range. There was no evidence for stenosis.     ------------------------------------------------------------  Tricuspid valve:   Structurally normal valve.    Doppler:  Transvalvular velocity was within the normal range.   Trivial  regurgitation.   ------------------------------------------------------------  Pulmonary artery:   The main pulmonary artery was  normal-sized. Systolic pressure was within the normal range.     ------------------------------------------------------------  Right atrium:  The atrium was normal in size.   ------------------------------------------------------------  Pericardium:  There was no pericardial effusion.   ------------------------------------------------------------  Systemic veins:  Inferior vena cava: The vessel was normal in size.    EKG: EKG was not ordered today 05/08/20: NSR   Recent Labs: 02/19/2021: B Natriuretic Peptide 10.6 05/31/2021: TSH 1.008 06/01/2021: ALT 20; BUN 17; Creatinine, Ser 0.93; Hemoglobin 14.4; Magnesium 2.5; Platelets 177; Potassium 4.0; Sodium 140  Recent Lipid Panel    Component Value Date/Time   CHOL 166 03/12/2021 1009   TRIG 114 03/12/2021 1009   HDL 54 03/12/2021 1009   CHOLHDL 3.1 03/12/2021 1009   CHOLHDL 3.4 12/27/2014 1055   VLDL 26 12/27/2014 1055   LDLCALC 92 03/12/2021 1009     Physical Exam:    VS:  BP 140/80    Pulse 71    Ht 5\' 3"  (1.6 m)    Wt 161 lb 6.4 oz (73.2 kg)    LMP 11/26/2000    SpO2 97%    BMI 28.59 kg/m     Wt Readings from Last 3 Encounters:  06/07/21 161 lb 6.4 oz (73.2 kg)  06/05/21 168 lb (76.2 kg)  06/01/21 170 lb (77.1 kg)    GEN:  Well nourished, well developed in no acute distress HEENT: Normal NECK: No JVD; No carotid bruits LYMPHATICS: No lymphadenopathy CARDIAC: RRR, 2/6 systolic murmur, no rubs or gallops RESPIRATORY:  Clear to auscultation without rales, wheezing or rhonchi  ABDOMEN: Soft, non-tender, non-distended MUSCULOSKELETAL:  No edema; No deformity  SKIN: Warm and dry NEUROLOGIC:  Alert and oriented x 3 PSYCHIATRIC:  Normal affect   ASSESSMENT:    1. SVT (supraventricular tachycardia) ([place])   2. Murmur   3. Primary hypertension   4. Statin myopathy   5.  Hyperlipidemia, unspecified hyperlipidemia type     PLAN:    In order of problems listed above:  #Paroxysmal SVT s/p Ablation: Has long history of SVT s/p ablation at [facility] in 2001. TTE in 2014 with normal LVEF, no significant valvular disease. Had several recurrences mainly  after surgical procedures and administration of anesthesia. Had recurrence of SVT in 04/2020 and 06/2020. Possible plan for repeat ablation if symptoms continue to recur. -Follow-up with Dr. Curt Bears as scheduled -Continue metop 50mg  XL -Discussed importance of maintaining hydration and avoiding excessive caffeine  #HTN: Fluctuates significantly with episodes as high as 190s but can be normal. Suspect she is running mainly higher on most days. Also with significant rheumatologic symptoms which may be contributing -Continue metop 50mg  XL as above -Will give losartan 25mg  daily if SBP>140  #HLD: #Statin Intolerance: Unable to tolerate statins due to myalgias and repatha stopped due to weight gain.  Ca score 10/2020 0.  -Continue zetia10mg  daily -Continue prava 20mg  daily -Ca score 0 -LDL goal <527  #Systolic Murmur: -Check TTE  #Concern for possible Rheum Condition: -Referred to Rheum  Medication Adjustments/Labs and Tests Ordered: Current medicines are reviewed at length with the patient today.  Concerns regarding medicines are outlined above.  Orders Placed This Encounter  Procedures   ECHOCARDIOGRAM COMPLETE   Meds ordered this encounter  Medications   losartan (COZAAR) 25 MG tablet    Sig: Take 1 tablet (25 mg total) by mouth daily.    Dispense:  90 tablet    Refill:  2     Patient Instructions  Medication Instructions:   START TAKING LOSARTAN 25 MG BY MOUTH DAILY  *If you need a refill on your cardiac medications before your next appointment, please call your pharmacy*   Testing/Procedures:  Your physician has requested that you have an echocardiogram. Echocardiography is a painless test  that uses sound waves to create images of your heart. It provides your doctor with information about the size and shape of your heart and how well your hearts chambers and valves are working. This procedure takes approximately one hour. There are no restrictions for this procedure.    Follow-Up:  IN June, AS PREVIOUSLY SCHEDULED WITH DR. Nancy Fetter Javier,acting as a scribe for Freada Bergeron, MD.,have documented all relevant documentation on the behalf of Freada Bergeron, MD,as directed by  Freada Bergeron, MD while in the presence of Freada Bergeron, MD.  I, Freada Bergeron, MD, have reviewed all documentation for this visit. The documentation on 06/07/21 for the exam, diagnosis, procedures, and orders are all accurate and complete.   Signed, Freada Bergeron, MD  06/07/2021 9:26 AM    [facility]

## 2021-06-07 NOTE — Patient Instructions (Signed)
Medication Instructions:   START TAKING LOSARTAN 25 MG BY MOUTH DAILY  *If you need a refill on your cardiac medications before your next appointment, please call your pharmacy*   Testing/Procedures:  Your physician has requested that you have an echocardiogram. Echocardiography is a painless test that uses sound waves to create images of your heart. It provides your doctor with information about the size and shape of your heart and how well your hearts chambers and valves are working. This procedure takes approximately one hour. There are no restrictions for this procedure.    Follow-Up:  IN June, AS PREVIOUSLY SCHEDULED WITH DR. Johney Frame

## 2021-06-08 ENCOUNTER — Ambulatory Visit (HOSPITAL_BASED_OUTPATIENT_CLINIC_OR_DEPARTMENT_OTHER): Payer: Medicare HMO | Admitting: Physical Therapy

## 2021-06-11 ENCOUNTER — Ambulatory Visit
Admission: RE | Admit: 2021-06-11 | Discharge: 2021-06-11 | Disposition: A | Discharge status: Home / Self Care | Payer: Medicare HMO | Source: Ambulatory Visit | Attending: Internal Medicine | Admitting: Internal Medicine

## 2021-06-11 ENCOUNTER — Other Ambulatory Visit: Payer: Self-pay

## 2021-06-11 ENCOUNTER — Ambulatory Visit (HOSPITAL_COMMUNITY): Payer: Medicare HMO

## 2021-06-11 DIAGNOSIS — R1011 Right upper quadrant pain: Secondary | ICD-10-CM | POA: Diagnosis present

## 2021-06-12 ENCOUNTER — Encounter: Payer: Self-pay | Admitting: Internal Medicine

## 2021-06-14 ENCOUNTER — Encounter (HOSPITAL_BASED_OUTPATIENT_CLINIC_OR_DEPARTMENT_OTHER): Payer: Medicare HMO | Admitting: Physical Therapy

## 2021-06-15 ENCOUNTER — Encounter (HOSPITAL_BASED_OUTPATIENT_CLINIC_OR_DEPARTMENT_OTHER): Payer: Self-pay | Admitting: Physical Therapy

## 2021-06-15 ENCOUNTER — Other Ambulatory Visit: Payer: Self-pay

## 2021-06-15 ENCOUNTER — Ambulatory Visit (HOSPITAL_COMMUNITY): Payer: Medicare HMO | Attending: Cardiology

## 2021-06-15 DIAGNOSIS — R011 Cardiac murmur, unspecified: Secondary | ICD-10-CM | POA: Insufficient documentation

## 2021-06-15 LAB — ECHOCARDIOGRAM COMPLETE
AR max vel: 2 cm2
AV Area VTI: 2.02 cm2
AV Area mean vel: 1.96 cm2
AV Mean grad: 7 mmHg
AV Peak grad: 12.7 mmHg
Ao pk vel: 1.78 m/s
Area-P 1/2: 3.27 cm2
S' Lateral: 2.5 cm

## 2021-06-18 ENCOUNTER — Telehealth: Payer: Self-pay | Admitting: Cardiology

## 2021-06-18 DIAGNOSIS — E782 Mixed hyperlipidemia: Secondary | ICD-10-CM

## 2021-06-18 DIAGNOSIS — T466X5A Adverse effect of antihyperlipidemic and antiarteriosclerotic drugs, initial encounter: Secondary | ICD-10-CM

## 2021-06-18 DIAGNOSIS — E785 Hyperlipidemia, unspecified: Secondary | ICD-10-CM

## 2021-06-18 DIAGNOSIS — Z79899 Other long term (current) drug therapy: Secondary | ICD-10-CM

## 2021-06-18 DIAGNOSIS — G720 Drug-induced myopathy: Secondary | ICD-10-CM

## 2021-06-18 NOTE — Telephone Encounter (Signed)
Spoke with the pt.  She is asking to have her lipids and LFTs checked sometime next week, for she states she is use to having those done every 3 months.  Informed the pt that she had them done back in Nov 2022, and they looked good, so ordering them too soon may cause an issue with her insurance.  Pt insist in having those done now. Order for lipids and LFTs placed in the system.  Scheduled these labs for her on 06/25/21.  She is aware to come fasting to this lab appt.  Pt verbalized understanding and agrees with this plan.

## 2021-06-18 NOTE — Telephone Encounter (Signed)
Patient calling to request lab work for her cholesterol medication. She states she was told to speak with PharmD, but they stated they have not seen her in 2 years and requested a message be sent to triage nurses.

## 2021-06-20 ENCOUNTER — Telehealth: Payer: Self-pay

## 2021-06-20 NOTE — Telephone Encounter (Signed)
Patient is scheduled for breast and pelvic exam (Medicare) on 06/28/21 with Jessica Sidle, NP. ? ?She had colpo 01/17/21 with Dr. Marguerita Merles. ? ?02/22/21 visit Dr. Marguerita Merles wrote "3. Vaginal discharge ?Wet prep Negative, reassured.  Discharge probably associated with not fully healed cervix from Bxs 01/17/2021.  F/U in 2 months to confirm full healing of the cervix. ? ?Patient is asking when she comes in 06/28/21 if Tiffany will check the biopsy sites to make sure they are healed.  She said she experiences odor from time to time. I assured patient Tiffany,NP will be happy to take care of her but to let me ask about her Medicare because I know that there are guidelines for her care. ? ?I spoke with Butch Penny about Medicare. I did advise patient that she is scheduled for routine breast and pelvic exam and Medicare covers that every two years. I did warn her, per Donna's note, that if problem visit, ie wet prep, etc. Occurs at that visit that there may be an additional charge to the visit.  Patient voiced understanding. ? ?

## 2021-06-21 NOTE — Progress Notes (Deleted)
? [name] ?[facility] ?[place] ?Phone: [phone] ?  ?Assessment and Plan:   ?  ?There are no diagnoses linked to this encounter.  ?*** ?- Patient has received significant relief with OMT in the past.  Elects for repeat OMT today.  Tolerated well per note below. ?- Decision today to treat with OMT was based on Physical Exam ?  ?After verbal consent patient was treated with HVLA (high velocity low amplitude), ME (muscle energy), FPR (flex positional release), ST (soft tissue), PC/PD (Pelvic Compression/ Pelvic Decompression) techniques in cervical, rib, thoracic, lumbar, and pelvic areas. Patient tolerated the procedure well with improvement in symptoms.  Patient educated on potential side effects of soreness and recommended to rest, hydrate, and use Tylenol as needed for pain control. ?  ?Pertinent previous records reviewed include *** ?  ?Follow Up: ***  ? ?  ?Subjective:   ?I, [name], am serving as a Education administrator for [employer] ? ?Chief Complaint: OMT follow up  ? ?HPI:  ?03/23/21 ?Patient with chronic back/neck pain hoping to avoid surgical intervention. ?  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  ?Radiates: low back into hip when laying on left side ?Mechanical symptoms:clicking neck  ?Numbness/tingling:no ?Weakness:no ?Aggravates:movement  ?Treatments tried: nsaids , chiropractor , acupuncture, dry needle  ?  ? 04/27/2021 ?Patient states that she is still having good days and painful days. She feels like she is doing better. Meloxicam is helping  ?  ?05/25/2021 ?Patient states that she's okay will hopefully be getting a CT scan soon , just here for some maintenance   ? ?06/22/2021 ?Patient states ? ? ?  ?Relevant Historical Information: Hashimoto's, DDD lumbar spine, history of SVT ? ?Additional pertinent review of systems negative. ? ?Current  Outpatient Medications  ?Medication Sig Dispense Refill  ? Cholecalciferol (VITAMIN D-3) 125 MCG (5000 UT) TABS Take 5,000 Units by mouth See admin instructions. Monday and Friday 30 tablet   ? dicyclomine (BENTYL) 10 MG capsule Take 1 capsule (10 mg total) by mouth 3 (three) times daily as needed for spasms. 21 capsule 0  ? ezetimibe (ZETIA) 10 MG tablet Take 10 mg by mouth at bedtime.    ? fluticasone (FLONASE) 50 MCG/ACT nasal spray Place 2 sprays into both nostrils daily. 16 g 0  ? ibuprofen (ADVIL) 200 MG tablet Take 400-600 mg by mouth daily as needed for moderate pain.    ? levothyroxine (SYNTHROID) 75 MCG tablet Take 75 mcg by mouth daily before breakfast.    ? losartan (COZAAR) 25 MG tablet Take 1 tablet (25 mg total) by mouth daily. 90 tablet 2  ? LOTEMAX SM 0.38 % GEL Place 1 drop into both eyes as directed. 1 drop QID for 7 Days, 1 drop TID for 7 Days, 1 drop BID for 7 Days, 1 drop Daily for 7 Days    ? metoprolol succinate (TOPROL-XL) 50 MG 24 hr tablet Take 1 tablet (50 mg total) by mouth daily. Take with or immediately following a meal. 90 tablet 3  ? Multiple Vitamin (MULTIVITAMIN WITH MINERALS) TABS tablet Take 1 tablet by mouth daily.    ? polyethylene glycol (MIRALAX / GLYCOLAX) packet Take 17 g by mouth daily as needed for mild constipation.     ? Polyvinyl Alcohol-Povidone PF (REFRESH) 1.4-0.6 % SOLN Apply 1 drop to eye 3 (  three) times daily as needed (for dry eyes).    ? pravastatin (PRAVACHOL) 20 MG tablet Take 1 tablet (20 mg total) by mouth every evening. 30 tablet 11  ? Probiotic Product (PROBIOTIC ADVANCED PO) Take 1 capsule by mouth daily.     ? RESTASIS 0.05 % ophthalmic emulsion Place 1 drop into both eyes 2 (two) times daily.    ? ?No current facility-administered medications for this visit.  ?  ?  ?Objective:   ?  ?There were no vitals filed for this visit.  ?  ?There is no height or weight on file to calculate BMI.  ?  ?Physical Exam:   ?  ?General: Well-appearing, cooperative,  sitting comfortably in no acute distress.  ? ?OMT Physical Exam: ? ?ASIS Compression Test: Positive Right ?Cervical: TTP paraspinal, *** ?Rib: Bilateral elevated first rib with TTP ?Thoracic: TTP paraspinal,*** ?Lumbar: TTP paraspinal,*** ?Pelvis: Right anterior innominate ? ?Electronically signed by:  ?[name] ?[facility] ?9:58 AM 06/21/21 ?

## 2021-06-22 ENCOUNTER — Ambulatory Visit: Payer: Medicare HMO | Admitting: Sports Medicine

## 2021-06-22 NOTE — Progress Notes (Signed)
? [name] D.Merril Abbe ?[facility] ?[place] ?Phone: [phone] ?  ?Assessment and Plan:   ?  ?1. DDD (degenerative disc disease), lumbar ?2. Neck pain ?3. Somatic dysfunction of cervical region ?4. Somatic dysfunction of thoracic region ?5. Somatic dysfunction of lumbar region ?6. Somatic dysfunction of pelvic region ?7. Somatic dysfunction of rib region ?-Chronic with exacerbation, subsequent visit ?- Recurrence of multiple musculoskeletal complaints with most prominent being in neck, though overall improved since starting regular OMT visits ?- Patient has received significant relief with OMT in the past.  Elects for repeat OMT today.  Tolerated well per note below. ?- Decision today to treat with OMT was based on Physical Exam ?-Continue work-up with rheumatology ? ?After verbal consent patient was treated with HVLA (high velocity low amplitude), ME (muscle energy), FPR (flex positional release), ST (soft tissue), PC/PD (Pelvic Compression/ Pelvic Decompression) techniques in cervical, rib, thoracic, lumbar, and pelvic areas. Patient tolerated the procedure well with improvement in symptoms.  Patient educated on potential side effects of soreness and recommended to rest, hydrate, and use Tylenol as needed for pain control. ?  ?Pertinent previous records reviewed include none ?  ?Follow Up: 4 weeks for repeat OMT maintenance ?  ?Subjective:   ?I, [name], am serving as a Education administrator for [employer] ? ?Chief Complaint: back pain  ? ?HPI:  ?03/23/21 ?Patient with chronic back/neck pain hoping to avoid surgical intervention. ?  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  ?Radiates: low back into hip when laying on left side ?Mechanical symptoms:clicking neck  ?Numbness/tingling:no ?Weakness:no ?Aggravates:movement  ?Treatments tried: nsaids , chiropractor ,  acupuncture, dry needle  ?  ? 04/27/2021 ?Patient states that she is still having good days and painful days. She feels like she is doing better. Meloxicam is helping  ? ?05/25/2021 ?Patient states that she's okay will hopefully be getting a CT scan soon , just here for some maintenance   ?  ?06/25/2021 ?Patient states that she's feeling pretty good isn't in as much pain as she had been having still stiff at times needs her adjustment  ? ?Relevant Historical Information: Hashimoto's, DDD lumbar spine, history of SVT ?Additional pertinent review of systems negative. ? ?Current Outpatient Medications  ?Medication Sig Dispense Refill  ? Cholecalciferol (VITAMIN D-3) 125 MCG (5000 UT) TABS Take 5,000 Units by mouth See admin instructions. Monday and Friday 30 tablet   ? dicyclomine (BENTYL) 10 MG capsule Take 1 capsule (10 mg total) by mouth 3 (three) times daily as needed for spasms. 21 capsule 0  ? ezetimibe (ZETIA) 10 MG tablet Take 10 mg by mouth at bedtime.    ? fluticasone (FLONASE) 50 MCG/ACT nasal spray Place 2 sprays into both nostrils daily. 16 g 0  ? ibuprofen (ADVIL) 200 MG tablet Take 400-600 mg by mouth daily as needed for moderate pain.    ? levothyroxine (SYNTHROID) 75 MCG tablet Take 75 mcg by mouth daily before breakfast.    ? losartan (COZAAR) 25 MG tablet Take 1 tablet (25 mg total) by mouth daily. 90 tablet 2  ? metoprolol succinate (TOPROL-XL) 50 MG 24 hr tablet Take 1 tablet (50 mg total) by mouth daily. Take with or immediately following a meal. 90 tablet 3  ? Multiple Vitamin (MULTIVITAMIN WITH MINERALS) TABS tablet Take 1 tablet by mouth daily.    ?  polyethylene glycol (MIRALAX / GLYCOLAX) packet Take 17 g by mouth daily as needed for mild constipation.     ? Polyvinyl Alcohol-Povidone PF (REFRESH) 1.4-0.6 % SOLN Apply 1 drop to eye 3 (three) times daily as needed (for dry eyes).    ? pravastatin (PRAVACHOL) 20 MG tablet Take 1 tablet (20 mg total) by mouth every evening. 30 tablet 11  ? Probiotic  Product (PROBIOTIC ADVANCED PO) Take 1 capsule by mouth daily.     ? RESTASIS 0.05 % ophthalmic emulsion Place 1 drop into both eyes 2 (two) times daily.    ? LOTEMAX SM 0.38 % GEL Place 1 drop into both eyes as directed. 1 drop QID for 7 Days, 1 drop TID for 7 Days, 1 drop BID for 7 Days, 1 drop Daily for 7 Days    ? ?No current facility-administered medications for this visit.  ?  ?  ?Objective:   ?  ?Vitals:  ? 06/25/21 1118  ?BP: 132/82  ?Pulse: 66  ?SpO2: 99%  ?Weight: 169 lb (76.7 kg)  ?Height: 5\' 3"  (1.6 m)  ?  ?  ?Body mass index is 29.94 kg/m?.  ?  ?Physical Exam:   ?  ?General: Well-appearing, cooperative, sitting comfortably in no acute distress.  ? ?OMT Physical Exam: ? ?ASIS Compression Test: Positive Right ?Cervical: TTP paraspinal, C3 RRSL ?Rib: Bilateral elevated first rib with TTP, worse on left ?Thoracic: TTP paraspinal, T7-10 RRSL ?Lumbar: TTP paraspinal, L2 RLSL ?Pelvis: Right anterior innominate ? ?Electronically signed by:  ?[name] D.Merril Abbe ?[facility] ?11:46 AM 06/25/21 ?

## 2021-06-25 ENCOUNTER — Other Ambulatory Visit: Payer: Medicare HMO | Admitting: *Deleted

## 2021-06-25 ENCOUNTER — Other Ambulatory Visit: Payer: Self-pay

## 2021-06-25 ENCOUNTER — Ambulatory Visit: Payer: Medicare HMO | Admitting: Sports Medicine

## 2021-06-25 VITALS — BP 132/82 | HR 66 | Ht 63.0 in | Wt 169.0 lb

## 2021-06-25 DIAGNOSIS — M9902 Segmental and somatic dysfunction of thoracic region: Secondary | ICD-10-CM

## 2021-06-25 DIAGNOSIS — M9903 Segmental and somatic dysfunction of lumbar region: Secondary | ICD-10-CM

## 2021-06-25 DIAGNOSIS — M5136 Other intervertebral disc degeneration, lumbar region: Secondary | ICD-10-CM | POA: Diagnosis not present

## 2021-06-25 DIAGNOSIS — M542 Cervicalgia: Secondary | ICD-10-CM

## 2021-06-25 DIAGNOSIS — Z79899 Other long term (current) drug therapy: Secondary | ICD-10-CM

## 2021-06-25 DIAGNOSIS — E785 Hyperlipidemia, unspecified: Secondary | ICD-10-CM

## 2021-06-25 DIAGNOSIS — G720 Drug-induced myopathy: Secondary | ICD-10-CM

## 2021-06-25 DIAGNOSIS — M9905 Segmental and somatic dysfunction of pelvic region: Secondary | ICD-10-CM

## 2021-06-25 DIAGNOSIS — E782 Mixed hyperlipidemia: Secondary | ICD-10-CM

## 2021-06-25 DIAGNOSIS — M9901 Segmental and somatic dysfunction of cervical region: Secondary | ICD-10-CM | POA: Diagnosis not present

## 2021-06-25 DIAGNOSIS — M9908 Segmental and somatic dysfunction of rib cage: Secondary | ICD-10-CM

## 2021-06-25 LAB — HEPATIC FUNCTION PANEL
ALT: 21 IU/L (ref 0–32)
AST: 20 IU/L (ref 0–40)
Albumin: 4.5 g/dL (ref 3.8–4.8)
Alkaline Phosphatase: 80 IU/L (ref 44–121)
Bilirubin Total: 0.6 mg/dL (ref 0.0–1.2)
Bilirubin, Direct: 0.15 mg/dL (ref 0.00–0.40)
Total Protein: 6.4 g/dL (ref 6.0–8.5)

## 2021-06-25 LAB — LIPID PANEL
Chol/HDL Ratio: 2.5 ratio (ref 0.0–4.4)
Cholesterol, Total: 158 mg/dL (ref 100–199)
HDL: 63 mg/dL (ref >39)
LDL Chol Calc (NIH): 78 mg/dL (ref 0–99)
Triglycerides: 91 mg/dL (ref 0–149)
VLDL Cholesterol Cal: 17 mg/dL (ref 5–40)

## 2021-06-25 NOTE — Patient Instructions (Addendum)
Good to see you  4 week follow up   

## 2021-06-28 ENCOUNTER — Other Ambulatory Visit (HOSPITAL_COMMUNITY)
Admission: RE | Admit: 2021-06-28 | Discharge: 2021-06-28 | Disposition: A | Discharge status: Home / Self Care | Payer: Medicare HMO | Source: Ambulatory Visit | Attending: Nurse Practitioner | Admitting: Nurse Practitioner

## 2021-06-28 ENCOUNTER — Ambulatory Visit (INDEPENDENT_AMBULATORY_CARE_PROVIDER_SITE_OTHER): Payer: Medicare HMO | Admitting: Nurse Practitioner

## 2021-06-28 ENCOUNTER — Other Ambulatory Visit: Payer: Self-pay

## 2021-06-28 ENCOUNTER — Encounter: Payer: Self-pay | Admitting: Nurse Practitioner

## 2021-06-28 VITALS — BP 126/80 | Ht 61.0 in | Wt 168.0 lb

## 2021-06-28 DIAGNOSIS — Z1272 Encounter for screening for malignant neoplasm of vagina: Secondary | ICD-10-CM | POA: Diagnosis not present

## 2021-06-28 DIAGNOSIS — Z01419 Encounter for gynecological examination (general) (routine) without abnormal findings: Secondary | ICD-10-CM | POA: Diagnosis present

## 2021-06-28 DIAGNOSIS — N870 Mild cervical dysplasia: Secondary | ICD-10-CM

## 2021-06-28 DIAGNOSIS — Z780 Asymptomatic menopausal state: Secondary | ICD-10-CM

## 2021-06-28 DIAGNOSIS — Z1151 Encounter for screening for human papillomavirus (HPV): Secondary | ICD-10-CM | POA: Diagnosis not present

## 2021-06-28 DIAGNOSIS — M8589 Other specified disorders of bone density and structure, multiple sites: Secondary | ICD-10-CM | POA: Diagnosis not present

## 2021-06-28 NOTE — Progress Notes (Signed)
? ?[name] [birth year] [id number] ? ? ?History:  71 y.o. G1P1001 presents for breast and pelvic exam. Postmenopausal - no HRT, no bleeding. See pap history below.  Hypothyroidism managed by endocrinology. Being evaluated for further autoimmune disease due to chronic fatigue.  ? 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 Biopsy confirmed CIN-1 neg HPV 16 ?07/2020 - normal cytology positive HPV 16 ?12/2020 - CIN-1 ? ?Gynecologic History ?Patient's last menstrual period was 11/26/2000. ?   ?Contraception: post menopausal status ?Sexually active: No ? ?Health maintenance ?Last Pap: 01/17/2021 Results were: LGSIL, CIN-1 confirmed by biopsy ?Last mammogram: 11/2020. Results were: Normal ?Last colonoscopy: 02/12/2019. Results were: Polyp, 10-year recall ?Last Dexa: 02/02/2020 Results were: t-score -1.7, FRAX 8.7% / 0.8% ? ?Past medical history, past surgical history, family history and social history were all reviewed and documented in the EPIC chart. Single. Retired. Daughter lives local.  ? ?ROS:  A ROS was performed and pertinent positives and negatives are included. ? ?Exam: ? ?Vitals:  ? 06/28/21 1351  ?BP: 126/80  ?Weight: 168 lb (76.2 kg)  ?Height: '5\' 1"'$  (1.549 m)  ? ? ?Body mass index is 31.74 kg/m?. ? ?General appearance:  Normal ?Thyroid:  Symmetrical, normal in size, without palpable masses or nodularity. ?Respiratory ? Auscultation:  Clear without wheezing or rhonchi ?Cardiovascular ? Auscultation:  Regular rate, without rubs, murmurs or gallops ? Edema/varicosities:  Not grossly evident ?Abdominal ? Soft,nontender, without masses, guarding or rebound. ? Liver/spleen:  No organomegaly noted ? Hernia:  None appreciated ? Skin ? Inspection:  Grossly normal ?  ?Breasts: Examined lying and sitting.  ? Right: Without masses, retractions, discharge or axillary adenopathy. ? ? Left: Without masses,  retractions, discharge or axillary adenopathy. ?Genitourinary  ? Inguinal/mons:  Normal without inguinal adenopathy ? External genitalia:  Normal appearing vulva with no masses, tenderness, or lesions ? BUS/Urethra/Skene's glands:  Normal ? Vagina:  Normal appearing with normal color and discharge, no lesions ? Cervix:  Normal appearing without discharge or lesions. Reparative changes ? Uterus:  Normal in size, shape and contour.  Midline and mobile, nontender ? Adnexa/parametria:   ?  Rt: Normal in size, without masses or tenderness. ?  Lt: Normal in size, without masses or tenderness. ? Anus and perineum: Normal ? Digital rectal exam: Normal sphincter tone without palpated masses or tenderness ? ?Patient informed chaperone available to be present for breast and pelvic exam. Patient has requested no chaperone to be present. Patient has been advised what will be completed during breast and pelvic exam.  ? ?Assessment/Plan:  71 y.o. G1P1001 for breast and pelvic exam.  ? ?Well female exam with routine gynecological exam - Plan: Cytology - PAP( [place]). Education provided on SBEs, importance of preventative screenings, current guidelines, high calcium diet, regular exercise, and multivitamin daily. Labs done elsewhere. ? ?Osteopenia of multiple sites - T-score -1.7 without elevated FRAX. Taking daily Vitamin D and does silver sneakers 4-5 days per week. Will repeat DXA this October.  ? ?Postmenopausal - Plan: DG Bone Density. No HRT, no bleeding. ? ?Dysplasia of cervix, low grade (CIN 1) - Plan: Cytology - PAP( [place]). See HPI. 10-monthpap repeat obtained today.  ? ?Screening for breast cancer - Normal mammogram history.  Continue annual screenings.  Normal breast exam today. ? ?Screening for colon cancer - 2020 colonoscopy. Will repeat at 10-year interval per GI's recommendation.  ? ?Follow up in  1 year for annual. ? ? ? ? ? ?Tamela Gammon [facility], 2:12 PM 06/28/2021 ? ?

## 2021-07-03 ENCOUNTER — Encounter: Payer: Self-pay | Admitting: Obstetrics & Gynecology

## 2021-07-03 ENCOUNTER — Encounter: Payer: Self-pay | Admitting: Internal Medicine

## 2021-07-03 ENCOUNTER — Encounter: Payer: Self-pay | Admitting: Cardiology

## 2021-07-03 LAB — CYTOLOGY - PAP
Comment: NEGATIVE
Diagnosis: UNDETERMINED — AB
High risk HPV: POSITIVE — AB

## 2021-07-04 ENCOUNTER — Ambulatory Visit: Payer: Medicare HMO | Admitting: Rheumatology

## 2021-07-05 ENCOUNTER — Encounter: Payer: Self-pay | Admitting: Internal Medicine

## 2021-07-06 ENCOUNTER — Encounter: Payer: Self-pay | Admitting: Internal Medicine

## 2021-07-09 ENCOUNTER — Ambulatory Visit: Payer: Medicare HMO | Admitting: Physician Assistant

## 2021-07-11 ENCOUNTER — Ambulatory Visit: Payer: Medicare HMO | Admitting: Cardiology

## 2021-07-12 ENCOUNTER — Ambulatory Visit: Payer: Medicare HMO | Admitting: Physician Assistant

## 2021-07-19 NOTE — Progress Notes (Signed)
? [name] D.Merril Abbe ?[facility] ?[place] ?Phone: [phone] ?  ?Assessment and Plan:   ?  ?1. DDD (degenerative disc disease), lumbar ?2. Neck pain ?3. Somatic dysfunction of cervical region ?4. Somatic dysfunction of thoracic region ?5. Somatic dysfunction of lumbar region ?6. Somatic dysfunction of pelvic region ?7. Somatic dysfunction of rib region ?-Chronic with exacerbation, subsequent visit ?- Recurrence of multiple musculoskeletal complaints with most prominent being in upper back and neck. ?- Patient has received significant relief with OMT in the past.  Elects for repeat OMT today.  Tolerated well per note below. ?- Decision today to treat with OMT was based on Physical Exam ?  ?After verbal consent patient was treated with HVLA (high velocity low amplitude), ME (muscle energy), FPR (flex positional release), ST (soft tissue), PC/PD (Pelvic Compression/ Pelvic Decompression) techniques in cervical, rib, thoracic, lumbar, and pelvic areas. Patient tolerated the procedure well with improvement in symptoms.  Patient educated on potential side effects of soreness and recommended to rest, hydrate, and use Tylenol as needed for pain control. ?  ? ?8. Positive ANA (antinuclear antibody) ?9. Polyarthralgia ?-Chronic with exacerbation, subsequent visit ?- Patient states that she is seeing her rheumatologist since her last visit, though I do not have access to these notes via epic.  Per patient, she was told that there were no additional autoimmune or rheumatologic findings besides her chronic thyroid disease.  States that she was told she may be experiencing fibromyalgia symptoms ?- Discussed duloxetine with patient, which we could trial.  Patient is not interested in starting this time, though states she will think about it.  She can contact our clinic at any time if she wishes to start this medication.  Would start duloxetine 30 mg daily ? ?Pertinent previous  records reviewed include none ?  ?Follow Up: 3 weeks for repeat OMT and discussion of patient would like to start duloxetine ?  ?Subjective:   ?I, [name], am serving as a Education administrator for [employer] ? ?Chief Complaint: OMT follow up  ? ?HPI:  ?03/23/21 ?Patient with chronic back/neck pain hoping to avoid surgical intervention. ?  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  ?Radiates: low back into hip when laying on left side ?Mechanical symptoms:clicking neck  ?Numbness/tingling:no ?Weakness:no ?Aggravates:movement  ?Treatments tried: nsaids , chiropractor , acupuncture, dry needle  ?  ? 04/27/2021 ?Patient states that she is still having good days and painful days. She feels like she is doing better. Meloxicam is helping  ?  ?05/25/2021 ?Patient states that she's okay will hopefully be getting a CT scan soon , just here for some maintenance   ?  ?06/25/2021 ?Patient states that she's feeling pretty good isn't in as much pain as she had been having still stiff at times needs her adjustment  ? ?07/23/2021 ?Patient states time for a tune up, neck and shoulder are tight giving her a little bit of a headache  ? ?  ?Relevant Historical Information: Hashimoto's, DDD lumbar spine, history of SVT ? ?Additional pertinent review of systems negative. ? ?Current Outpatient Medications  ?Medication Sig Dispense Refill  ? Cholecalciferol (VITAMIN D-3) 125 MCG (5000 UT) TABS Take 5,000 Units by mouth See admin instructions. Monday and Friday 30 tablet   ? ezetimibe (ZETIA) 10 MG tablet Take 10 mg by mouth at bedtime.    ?  ibuprofen (ADVIL) 200 MG tablet Take 400-600 mg by mouth daily as needed for moderate pain.    ? levothyroxine (SYNTHROID) 75 MCG tablet Take 75 mcg by mouth daily before breakfast.    ? losartan (COZAAR) 25 MG tablet Take 1 tablet (25 mg total) by mouth daily. 90 tablet 2  ? metoprolol succinate (TOPROL-XL) 50 MG  24 hr tablet Take 1 tablet (50 mg total) by mouth daily. Take with or immediately following a meal. 90 tablet 3  ? Multiple Vitamin (MULTIVITAMIN WITH MINERALS) TABS tablet Take 1 tablet by mouth daily.    ? polyethylene glycol (MIRALAX / GLYCOLAX) packet Take 17 g by mouth daily as needed for mild constipation.     ? Polyvinyl Alcohol-Povidone PF (REFRESH) 1.4-0.6 % SOLN Apply 1 drop to eye 3 (three) times daily as needed (for dry eyes).    ? pravastatin (PRAVACHOL) 20 MG tablet Take 1 tablet (20 mg total) by mouth every evening. 30 tablet 11  ? Probiotic Product (PROBIOTIC ADVANCED PO) Take 1 capsule by mouth daily.     ? RESTASIS 0.05 % ophthalmic emulsion Place 1 drop into both eyes 2 (two) times daily.    ? fluticasone (FLONASE) 50 MCG/ACT nasal spray Place 2 sprays into both nostrils daily. 16 g 0  ? ?No current facility-administered medications for this visit.  ?  ?  ?Objective:   ?  ?Vitals:  ? 07/23/21 1121  ?BP: 118/78  ?Pulse: 74  ?Resp: (!) 98  ?Weight: 171 lb (77.6 kg)  ?Height: '5\' 1"'$  (1.549 m)  ?  ?  ?Body mass index is 32.31 kg/m?.  ?  ?Physical Exam:   ?  ?General: Well-appearing, cooperative, sitting comfortably in no acute distress.  ? ?OMT Physical Exam: ? ?ASIS Compression Test: Positive left ?Cervical: TTP paraspinal, C3-5 RRSR ?Rib: Right elevated first rib with TTP ?Thoracic: TTP paraspinal, limited motion T3-7 ?Lumbar: TTP paraspinal, L5 rotated right ?Pelvis: Left posterior innominate ? ?Electronically signed by:  ?[name] D.Merril Abbe ?[facility] ?11:49 AM 07/23/21 ?

## 2021-07-23 ENCOUNTER — Ambulatory Visit: Payer: Medicare HMO | Admitting: Sports Medicine

## 2021-07-23 VITALS — BP 118/78 | HR 74 | Resp 98 | Ht 61.0 in | Wt 171.0 lb

## 2021-07-23 DIAGNOSIS — M9901 Segmental and somatic dysfunction of cervical region: Secondary | ICD-10-CM

## 2021-07-23 DIAGNOSIS — M9903 Segmental and somatic dysfunction of lumbar region: Secondary | ICD-10-CM

## 2021-07-23 DIAGNOSIS — M542 Cervicalgia: Secondary | ICD-10-CM

## 2021-07-23 DIAGNOSIS — M9902 Segmental and somatic dysfunction of thoracic region: Secondary | ICD-10-CM | POA: Diagnosis not present

## 2021-07-23 DIAGNOSIS — M2550 Pain in unspecified joint: Secondary | ICD-10-CM

## 2021-07-23 DIAGNOSIS — M5136 Other intervertebral disc degeneration, lumbar region: Secondary | ICD-10-CM | POA: Diagnosis not present

## 2021-07-23 DIAGNOSIS — M9905 Segmental and somatic dysfunction of pelvic region: Secondary | ICD-10-CM

## 2021-07-23 DIAGNOSIS — R768 Other specified abnormal immunological findings in serum: Secondary | ICD-10-CM

## 2021-07-23 DIAGNOSIS — M9908 Segmental and somatic dysfunction of rib cage: Secondary | ICD-10-CM

## 2021-07-23 NOTE — Patient Instructions (Signed)
Good to see you 3 week follow up  

## 2021-07-24 ENCOUNTER — Ambulatory Visit: Payer: Medicare HMO | Admitting: Rheumatology

## 2021-08-03 ENCOUNTER — Encounter: Payer: Self-pay | Admitting: Internal Medicine

## 2021-08-03 ENCOUNTER — Ambulatory Visit: Payer: Medicare HMO | Admitting: Internal Medicine

## 2021-08-03 VITALS — BP 110/88 | HR 87 | Ht 63.0 in | Wt 168.0 lb

## 2021-08-03 DIAGNOSIS — K30 Functional dyspepsia: Secondary | ICD-10-CM

## 2021-08-03 DIAGNOSIS — D135 Benign neoplasm of extrahepatic bile ducts: Secondary | ICD-10-CM | POA: Diagnosis not present

## 2021-08-03 DIAGNOSIS — K219 Gastro-esophageal reflux disease without esophagitis: Secondary | ICD-10-CM | POA: Diagnosis not present

## 2021-08-03 DIAGNOSIS — K5900 Constipation, unspecified: Secondary | ICD-10-CM

## 2021-08-03 NOTE — Patient Instructions (Signed)
If you are age 70 or older, your body mass index should be between 23-30. Your Body mass index is 29.76 kg/m?[name] If this is out of the aforementioned range listed, please consider follow up with your Primary Care Provider. ? ?If you are age 69 or younger, your body mass index should be between 19-25. Your Body mass index is 29.76 kg/m?[name] If this is out of the aformentioned range listed, please consider follow up with your Primary Care Provider. ? ?You have been scheduled for an endoscopy. Please follow written instructions given to you at your visit today. ?If you use inhalers (even only as needed), please bring them with you on the day of your procedure. ?  ? ?The [facility] providers would like to encourage you to use [facility] to communicate with providers for non-urgent requests or questions.  Due to long hold times on the telephone, sending your provider a message by [facility] may be a faster and more efficient way to get a response.  Please allow 48 business hours for a response.  Please remember that this is for non-urgent requests.  ?_______________________________________________________ ? ?It was a pleasure to see you today! ? ?Thank you for trusting me with your gastrointestinal care!   ? ? ? ?

## 2021-08-03 NOTE — Progress Notes (Signed)
? ?Chief Complaint: Abdominal discomfort ? ?HPI : 71 year old female with history of SVT, hypothyroidism presents for follow up of abdominal discomfort ? ?She is a retired Therapist, sports ? ?Interval History: She states that she has continued to have some issues with an upset stomach.  Describes some indigestion issues. Denies overt abdominal pain. Her constipation issues are better controlled.  She does feel like she has benefited from the daily MiraLAX therapy, and continues to have regular bowel movements on the MiraLAX. ? ?Current Outpatient Medications  ?Medication Sig Dispense Refill  ? Cholecalciferol (VITAMIN D-3) 125 MCG (5000 UT) TABS Take 5,000 Units by mouth See admin instructions. Monday and Friday 30 tablet   ? ezetimibe (ZETIA) 10 MG tablet Take 10 mg by mouth at bedtime.    ? levothyroxine (SYNTHROID) 75 MCG tablet Take 75 mcg by mouth daily before breakfast.    ? losartan (COZAAR) 25 MG tablet Take 1 tablet (25 mg total) by mouth daily. 90 tablet 2  ? metoprolol succinate (TOPROL-XL) 50 MG 24 hr tablet Take 1 tablet (50 mg total) by mouth daily. Take with or immediately following a meal. 90 tablet 3  ? Multiple Vitamin (MULTIVITAMIN WITH MINERALS) TABS tablet Take 1 tablet by mouth daily.    ? polyethylene glycol (MIRALAX / GLYCOLAX) packet Take 17 g by mouth daily as needed for mild constipation.     ? Polyvinyl Alcohol-Povidone PF (REFRESH) 1.4-0.6 % SOLN Apply 1 drop to eye 3 (three) times daily as needed (for dry eyes).    ? pravastatin (PRAVACHOL) 20 MG tablet Take 1 tablet (20 mg total) by mouth every evening. 30 tablet 11  ? Probiotic Product (PROBIOTIC ADVANCED PO) Take 1 capsule by mouth daily.     ? RESTASIS 0.05 % ophthalmic emulsion Place 1 drop into both eyes 2 (two) times daily.    ? fluticasone (FLONASE) 50 MCG/ACT nasal spray Place 2 sprays into both nostrils daily. 16 g 0  ? ibuprofen (ADVIL) 200 MG tablet Take 400-600 mg by mouth daily as needed for moderate pain.    ? ?No current  facility-administered medications for this visit.  ? ?Allergies  ?Allergen Reactions  ? Diphenhydramine Palpitations  ?  Other reaction(s): tachycardia  ? Epinephrine Palpitations  ? Lipitor [Atorvastatin] Other (See Comments)  ?  Muscle aches  ? Praluent [Alirocumab]   ?  runny nose, sinus congestion, myalgias, UTI, weight gain  ? Repatha [Evolocumab]   ?  Joint pain  ? ?Review of Systems: ?All systems reviewed and negative except where noted in HPI.  ? ?Physical Exam: ?BP 110/88   Pulse 87   Ht '5\' 3"'$  (1.6 m)   Wt 168 lb (76.2 kg)   LMP 11/26/2000   BMI 29.76 kg/m?  ?Constitutional: Pleasant,well-developed, female in no acute distress. ?HEENT: Normocephalic and atraumatic. Conjunctivae are normal. No scleral icterus. ?Cardiovascular: Normal rate, regular rhythm.  ?Pulmonary/chest: Effort normal and breath sounds normal. No wheezing, rales or rhonchi. ?Abdominal: Soft, nondistended, non-tender. Bowel sounds active throughout. There are no masses palpable. No hepatomegaly. ?Extremities: No edema ?Neurological: Alert and oriented to person place and time. ?Skin: Skin is warm and dry. No rashes noted. ?Psychiatric: Normal mood and affect. Behavior is normal. ? ?Labs 06/01/21: CBC unremarkable. CMP nml. TSH nml. Lipase nml. ? ?CT A/P 05/27/21: ?IMPRESSION: ?1. No acute findings within the abdomen or pelvis. No bowel ?obstruction or evidence of bowel wall inflammation. No evidence of ?acute solid organ abnormality. No renal or ureteral calculi. ?2. Leiomyomatous  uterus. ?3. Degenerative spondylosis within the lower lumbar spine, at least ?moderate in degree at the lumbosacral junction. ? ?RUQ U/S 06/11/21: ?FINDINGS: ?Gallbladder: ?No gallstones or wall thickening visualized. No sonographic Percell Miller ?sign noted by sonographer. Tiny focus of adenomyomatosis at the ?gallbladder fundus. ?Common bile duct: ?Diameter: 2 mm ?Liver: ?The liver demonstrates normal echogenicity. Two small cysts measure ?up to 1 cm in the right  lobe of the liver. There is a 1.4 x 0.8 x ?1.2 cm focus of subcapsular calcification in the right lobe, also ?seen on the prior CT. Portal vein is patent on color Doppler imaging ?with normal direction of blood flow towards the liver. ?Other: None. ?IMPRESSION: ?1. No gallstone or sonographic evidence of acute cholecystitis. ?2. Small liver cysts. ?3. Small focus of subcapsular calcification in the right lobe of the ?liver ? ?Colonoscopy 02/12/19: ?- One 8 mm polyp in the cecum, removed with a hot snare. Resected and retrieved. ?- Internal hemorrhoids. ?Path: ?A. COLON, CECUM POLYP, BIOPSY:  ?- Tubular adenoma.  ?- No high-grade dysplasia or carcinoma.  ? ?ASSESSMENT AND PLAN: ? ?Indigestion ?GERD ?Constipation ?Gallbladder adenomyomatosis ?Patient has continued to have some issues with indigestion and some abdominal discomfort.  She has had some improvement with the MiraLAX therapy.  The patient has not had complete resolution of her abdominal symptoms, will plan for EGD to start off with for further evaluation since patient may be having uncontrolled GERD or PUD. I offered to empirically start medications for GERD, but she would prefer to start off with endoscopic evaluation first.  Patient was found to have gallbladder adenomyomatosis on her last abdominal ultrasound, which is considered a benign lesion without clear evidence of being premalignant  However, due to patient concern, we will plan to repeat a right upper quadrant ultrasound in 1 year for surveillance. Rarely adenomyomatosis can cause abdominal discomfort.  ?- Cont Miralax QD ?- Repeat RUQ U/S in 1 year ?- EGD LEC ?- Repeat colonoscopy in 01/2026 based upon prior colonoscopy ? ?Christia Reading, MD ? ?

## 2021-08-10 NOTE — Progress Notes (Signed)
? [name] D.Merril Abbe ?[facility] ?[place] ?Phone: [phone] ?  ?Assessment and Plan:   ?  ?1. Neck pain ?2. DDD (degenerative disc disease), lumbar ?3. Somatic dysfunction of cervical region ?4. Somatic dysfunction of thoracic region ?5. Somatic dysfunction of lumbar region ?6. Somatic dysfunction of pelvic region ?7. Somatic dysfunction of rib region ?-Chronic with exacerbation, subsequent visit ?- Recurrence of multiple musculoskeletal complaints with overall improvement with regular OMT treatments ?- Patient has received significant relief with OMT in the past.  Elects for repeat OMT today.  Tolerated well per note below. ?- Decision today to treat with OMT was based on Physical Exam ?  ?After verbal consent patient was treated with HVLA (high velocity low amplitude), ME (muscle energy), FPR (flex positional release), ST (soft tissue), PC/PD (Pelvic Compression/ Pelvic Decompression) techniques in cervical, rib, thoracic, lumbar, and pelvic areas. Patient tolerated the procedure well with improvement in symptoms.  Patient educated on potential side effects of soreness and recommended to rest, hydrate, and use Tylenol as needed for pain control. ?  ?Pertinent previous records reviewed include none ?  ?Follow Up: 4 weeks for OMT maintenance ?  ?Subjective:   ?I, [name], am serving as a Education administrator for [employer] ? ?Chief Complaint: OMT follow up  ?  ?HPI:  ?03/23/21 ?Patient with chronic back/neck pain hoping to avoid surgical intervention. ?  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  ?Radiates: low back into hip when laying on left side ?Mechanical symptoms:clicking neck  ?Numbness/tingling:no ?Weakness:no ?Aggravates:movement  ?Treatments tried: nsaids , chiropractor , acupuncture, dry needle  ?  ? 04/27/2021 ?Patient states that she is still having good  days and painful days. She feels like she is doing better. Meloxicam is helping  ?  ?05/25/2021 ?Patient states that she's okay will hopefully be getting a CT scan soon , just here for some maintenance   ?  ?06/25/2021 ?Patient states that she's feeling pretty good isn't in as much pain as she had been having still stiff at times needs her adjustment  ?  ?07/23/2021 ?Patient states time for a tune up, neck and shoulder are tight giving her a little bit of a headache  ? ?08/13/2021 ?Patient states that she is doing the neck and shoulder are tight still getting a headache ? ?  ?  ?Relevant Historical Information: Hashimoto's, DDD lumbar spine, history of SVT ? ?Additional pertinent review of systems negative. ? ?Current Outpatient Medications  ?Medication Sig Dispense Refill  ? Cholecalciferol (VITAMIN D-3) 125 MCG (5000 UT) TABS Take 5,000 Units by mouth See admin instructions. Monday and Friday 30 tablet   ? ezetimibe (ZETIA) 10 MG tablet Take 10 mg by mouth at bedtime.    ? levothyroxine (SYNTHROID) 75 MCG tablet Take 75 mcg by mouth daily before breakfast.    ? losartan (COZAAR) 25 MG tablet Take 1 tablet (25 mg total) by mouth daily. 90 tablet 2  ? metoprolol succinate (TOPROL-XL) 50 MG 24 hr tablet Take 1 tablet (50 mg total) by mouth daily. Take with or immediately following a meal. 90 tablet 3  ? Multiple Vitamin (MULTIVITAMIN WITH MINERALS) TABS tablet Take 1 tablet by mouth daily.    ? polyethylene glycol (MIRALAX / GLYCOLAX) packet Take 17 g by mouth daily as needed for mild constipation.     ? Polyvinyl  Alcohol-Povidone PF (REFRESH) 1.4-0.6 % SOLN Apply 1 drop to eye 3 (three) times daily as needed (for dry eyes).    ? pravastatin (PRAVACHOL) 20 MG tablet Take 1 tablet (20 mg total) by mouth every evening. 30 tablet 11  ? Probiotic Product (PROBIOTIC ADVANCED PO) Take 1 capsule by mouth daily.     ? RESTASIS 0.05 % ophthalmic emulsion Place 1 drop into both eyes 2 (two) times daily.    ? fluticasone (FLONASE) 50  MCG/ACT nasal spray Place 2 sprays into both nostrils daily. 16 g 0  ? ibuprofen (ADVIL) 200 MG tablet Take 400-600 mg by mouth daily as needed for moderate pain.    ? ?No current facility-administered medications for this visit.  ?  ?  ?Objective:   ?  ?Vitals:  ? 08/13/21 1409  ?BP: 130/80  ?Pulse: 60  ?SpO2: 97%  ?Weight: 168 lb (76.2 kg)  ?Height: '5\' 3"'$  (1.6 m)  ?  ?  ?Body mass index is 29.76 kg/m?.  ?  ?Physical Exam:   ?  ?General: Well-appearing, cooperative, sitting comfortably in no acute distress.  ? ?OMT Physical Exam: ? ?ASIS Compression Test: Positive Right ?Cervical: TTP paraspinal, C3 RRSL SR ?Rib: Bilateral elevated first rib with TTP ?Thoracic: TTP paraspinal, minimal motion from T3-T7 ?Lumbar: TTP paraspinal, L1-3 RRSL ?Pelvis: Right anterior innominate ? ?Electronically signed by:  ?[name] D.Merril Abbe ?[facility] ?3:51 PM 08/13/21 ?

## 2021-08-13 ENCOUNTER — Ambulatory Visit: Payer: Medicare HMO | Admitting: Sports Medicine

## 2021-08-13 VITALS — BP 130/80 | HR 60 | Ht 63.0 in | Wt 168.0 lb

## 2021-08-13 DIAGNOSIS — M9902 Segmental and somatic dysfunction of thoracic region: Secondary | ICD-10-CM

## 2021-08-13 DIAGNOSIS — M5136 Other intervertebral disc degeneration, lumbar region: Secondary | ICD-10-CM | POA: Diagnosis not present

## 2021-08-13 DIAGNOSIS — M9905 Segmental and somatic dysfunction of pelvic region: Secondary | ICD-10-CM

## 2021-08-13 DIAGNOSIS — M542 Cervicalgia: Secondary | ICD-10-CM | POA: Diagnosis not present

## 2021-08-13 DIAGNOSIS — M9903 Segmental and somatic dysfunction of lumbar region: Secondary | ICD-10-CM

## 2021-08-13 DIAGNOSIS — M9901 Segmental and somatic dysfunction of cervical region: Secondary | ICD-10-CM

## 2021-08-13 DIAGNOSIS — M9908 Segmental and somatic dysfunction of rib cage: Secondary | ICD-10-CM

## 2021-08-13 NOTE — Patient Instructions (Signed)
Good to see you   

## 2021-08-23 ENCOUNTER — Other Ambulatory Visit: Payer: Self-pay

## 2021-08-23 ENCOUNTER — Telehealth: Payer: Self-pay | Admitting: Internal Medicine

## 2021-08-23 DIAGNOSIS — R109 Unspecified abdominal pain: Secondary | ICD-10-CM

## 2021-08-23 MED ORDER — DICYCLOMINE HCL 20 MG PO TABS: 20.0000 mg | ORAL_TABLET | Freq: Two times a day (BID) | ORAL | 1 refills | Status: DC | PRN

## 2021-08-23 NOTE — Telephone Encounter (Signed)
Patient called seeking advise. Per patient, has lower abd spasms. "It feels like gas." Patient would like to know what she can do. Please advise. ?

## 2021-08-23 NOTE — Telephone Encounter (Signed)
Rx sent to pt preferred pharmacy per Dr. Libby Maw request. Called pt to make her aware. LVM requesting returned call. ?

## 2021-08-23 NOTE — Telephone Encounter (Signed)
Pt returned call. Informed of Dr. Libby Maw recommendation. Verbalized acceptance and understanding. ?

## 2021-08-28 ENCOUNTER — Other Ambulatory Visit: Payer: Self-pay | Admitting: *Deleted

## 2021-08-28 MED ORDER — EZETIMIBE 10 MG PO TABS: 10.0000 mg | ORAL_TABLET | Freq: Every day | ORAL | 3 refills | Status: DC

## 2021-09-03 NOTE — Progress Notes (Signed)
? [name] D.Merril Abbe ?[facility] ?[place] ?Phone: [phone] ?  ?Assessment and Plan:   ?  ?1. Neck pain ?2. DDD (degenerative disc disease), lumbar ?3. Somatic dysfunction of cervical region ?4. Somatic dysfunction of thoracic region ?5. Somatic dysfunction of lumbar region ?6. Somatic dysfunction of pelvic region ?7. Somatic dysfunction of sacrum region ?-Chronic with exacerbation, subsequent visit ?- Recurrence of multiple musculoskeletal complaints with most prominent being neck and right trapezius strain, though overall significantly improved with regular OMT visits ?- Patient has received significant relief with OMT in the past.  Elects for repeat OMT today.  Tolerated well per note below. ?- Decision today to treat with OMT was based on Physical Exam ?  ?After verbal consent patient was treated with HVLA (high velocity low amplitude), ME (muscle energy), FPR (flex positional release), ST (soft tissue), PC/PD (Pelvic Compression/ Pelvic Decompression) techniques in cervical, sacral, thoracic, lumbar, and pelvic areas. Patient tolerated the procedure well with improvement in symptoms.  Patient educated on potential side effects of soreness and recommended to rest, hydrate, and use Tylenol as needed for pain control. ?  ?Pertinent previous records reviewed include none ?  ?Follow Up: 4-week follow-up for repeat OMT maintenance ?  ?Subjective:   ?I, [name], am serving as a Education administrator for [employer] ? ?Chief Complaint: OMT follow up  ?  ?HPI:  ?03/23/21 ?Patient with chronic back/neck pain hoping to avoid surgical intervention. ?  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  ?Radiates: low back into hip when laying on left side ?Mechanical symptoms:clicking neck  ?Numbness/tingling:no ?Weakness:no ?Aggravates:movement  ?Treatments tried: nsaids , chiropractor  , acupuncture, dry needle  ?  ? 04/27/2021 ?Patient states that she is still having good days and painful days. She feels like she is doing better. Meloxicam is helping  ?  ?05/25/2021 ?Patient states that she's okay will hopefully be getting a CT scan soon , just here for some maintenance   ?  ?06/25/2021 ?Patient states that she's feeling pretty good isn't in as much pain as she had been having still stiff at times needs her adjustment  ?  ?07/23/2021 ?Patient states time for a tune up, neck and shoulder are tight giving her a little bit of a headache  ?  ?08/13/2021 ?Patient states that she is doing the neck and shoulder are tight still getting a headache ?  ?5/16/023 ?Patient states she is doing pain is intermittent but over all better  ? ?  ?Relevant Historical Information: Hashimoto's, DDD lumbar spine, history of SVT ? ?Additional pertinent review of systems negative. ? ?Current Outpatient Medications  ?Medication Sig Dispense Refill  ? Cholecalciferol (VITAMIN D-3) 125 MCG (5000 UT) TABS Take 5,000 Units by mouth See admin instructions. Monday and Friday 30 tablet   ? ezetimibe (ZETIA) 10 MG tablet Take 1 tablet (10 mg total) by mouth at bedtime. 90 tablet 3  ? levothyroxine (SYNTHROID) 75 MCG tablet Take 75 mcg by mouth daily before breakfast.    ? losartan (COZAAR) 25 MG tablet Take 1 tablet (25 mg total) by mouth daily. 90 tablet 2  ? metoprolol succinate (TOPROL-XL) 50 MG 24 hr tablet Take 1 tablet (50 mg total) by mouth daily. Take with or immediately following a meal. 90 tablet 3  ? Multiple Vitamin (MULTIVITAMIN WITH MINERALS) TABS tablet Take 1 tablet  by mouth daily.    ? polyethylene glycol (MIRALAX / GLYCOLAX) packet Take 17 g by mouth daily as needed for mild constipation.     ? Polyvinyl Alcohol-Povidone PF (REFRESH) 1.4-0.6 % SOLN Apply 1 drop to eye 3 (three) times daily as needed (for dry eyes).    ? pravastatin (PRAVACHOL) 20 MG tablet Take 1 tablet (20 mg total) by mouth every evening. 30 tablet 11   ? Probiotic Product (PROBIOTIC ADVANCED PO) Take 1 capsule by mouth daily.     ? RESTASIS 0.05 % ophthalmic emulsion Place 1 drop into both eyes 2 (two) times daily.    ? dicyclomine (BENTYL) 20 MG tablet Take 1 tablet (20 mg total) by mouth 2 (two) times daily as needed for spasms. 30 tablet 1  ? fluticasone (FLONASE) 50 MCG/ACT nasal spray Place 2 sprays into both nostrils daily. 16 g 0  ? ibuprofen (ADVIL) 200 MG tablet Take 400-600 mg by mouth daily as needed for moderate pain.    ? ?No current facility-administered medications for this visit.  ?  ?  ?Objective:   ?  ?Vitals:  ? 09/04/21 1346  ?BP: 118/80  ?Pulse: 79  ?SpO2: 93%  ?Weight: 169 lb (76.7 kg)  ?Height: '5\' 3"'$  (1.6 m)  ?  ?  ?Body mass index is 29.94 kg/m?.  ?  ?Physical Exam:   ?  ?General: Well-appearing, cooperative, sitting comfortably in no acute distress.  ? ?OMT Physical Exam: ? ?ASIS Compression Test: Positive Right ?Cervical: TTP paraspinal, C5 RR SL ?Sacrum: Positive sphinx, TTP left sacral base ?Thoracic: TTP paraspinal, decreased motion through thoracic spine ?Lumbar: TTP paraspinal, L1-3 RRSL, L5 RL ?Pelvis: Right anterior innominate ? ?Electronically signed by:  ?[name] D.Merril Abbe ?[facility] ?2:04 PM 09/04/21 ?

## 2021-09-04 ENCOUNTER — Ambulatory Visit: Payer: Medicare HMO | Admitting: Sports Medicine

## 2021-09-04 VITALS — BP 118/80 | HR 79 | Ht 63.0 in | Wt 169.0 lb

## 2021-09-04 DIAGNOSIS — M542 Cervicalgia: Secondary | ICD-10-CM | POA: Diagnosis not present

## 2021-09-04 DIAGNOSIS — M9905 Segmental and somatic dysfunction of pelvic region: Secondary | ICD-10-CM

## 2021-09-04 DIAGNOSIS — M51369 Other intervertebral disc degeneration, lumbar region without mention of lumbar back pain or lower extremity pain: Secondary | ICD-10-CM

## 2021-09-04 DIAGNOSIS — M9902 Segmental and somatic dysfunction of thoracic region: Secondary | ICD-10-CM

## 2021-09-04 DIAGNOSIS — M9901 Segmental and somatic dysfunction of cervical region: Secondary | ICD-10-CM

## 2021-09-04 DIAGNOSIS — M9903 Segmental and somatic dysfunction of lumbar region: Secondary | ICD-10-CM

## 2021-09-04 DIAGNOSIS — M9904 Segmental and somatic dysfunction of sacral region: Secondary | ICD-10-CM

## 2021-09-04 DIAGNOSIS — M5136 Other intervertebral disc degeneration, lumbar region: Secondary | ICD-10-CM

## 2021-09-04 NOTE — Patient Instructions (Signed)
Good to see you   

## 2021-09-14 ENCOUNTER — Encounter: Payer: Self-pay | Admitting: Internal Medicine

## 2021-09-14 ENCOUNTER — Ambulatory Visit (AMBULATORY_SURGERY_CENTER): Payer: Medicare HMO | Admitting: Internal Medicine

## 2021-09-14 VITALS — BP 107/68 | HR 63 | Temp 97.5°F | Resp 16 | Ht 63.0 in | Wt 168.0 lb

## 2021-09-14 DIAGNOSIS — R12 Heartburn: Secondary | ICD-10-CM | POA: Diagnosis present

## 2021-09-14 DIAGNOSIS — K259 Gastric ulcer, unspecified as acute or chronic, without hemorrhage or perforation: Secondary | ICD-10-CM

## 2021-09-14 DIAGNOSIS — K219 Gastro-esophageal reflux disease without esophagitis: Secondary | ICD-10-CM

## 2021-09-14 MED ORDER — OMEPRAZOLE 40 MG PO CPDR: 40.0000 mg | DELAYED_RELEASE_CAPSULE | Freq: Two times a day (BID) | ORAL | 2 refills | Status: DC

## 2021-09-14 MED ORDER — SODIUM CHLORIDE 0.9 % IV SOLN: 500.0000 mL | Freq: Once | INTRAVENOUS | Status: DC

## 2021-09-14 NOTE — Progress Notes (Unsigned)
Called to room to assist during endoscopic procedure.  Patient ID and intended procedure confirmed with present staff. Received instructions for my participation in the procedure from the performing physician.  

## 2021-09-14 NOTE — Op Note (Signed)
[name]: [name] Procedure Date: 09/14/2021 3:41 PM MRN: [id number] Endoscopist: Sonny Masters "[name] ,  Age: 71 Referring MD:  Date of Birth: [birth year] Gender: Female Account #: [id number] Procedure:                Upper GI endoscopy Indications:              Heartburn Medicines:                Monitored Anesthesia Care Procedure:                Pre-Anesthesia Assessment:                           - Prior to the procedure, a History and Physical                            was performed, and patient medications and                            allergies were reviewed. The patient's tolerance of                            previous anesthesia was also reviewed. The risks                            and benefits of the procedure and the sedation                            options and risks were discussed with the patient.                            All questions were answered, and informed consent                            was obtained. Prior Anticoagulants: The patient has                            taken no previous anticoagulant or antiplatelet                            agents. ASA Grade Assessment: II - A patient with                            mild systemic disease. After reviewing the risks                            and benefits, the patient was deemed in                            satisfactory condition to undergo the procedure.                           After obtaining informed consent, the endoscope was  passed under direct vision. Throughout the                            procedure, the patient's blood pressure, pulse, and                            oxygen saturations were monitored continuously. The                            Endoscope was introduced through the mouth, and                            advanced to the second part of duodenum. The upper                            GI endoscopy was accomplished  without difficulty.                            The patient tolerated the procedure well. Scope In: Scope Out: Findings:                 Localized mucosal variance characterized by                            erythema was found in the upper third of the                            esophagus. This is consistent with an inlet patch.                           White nummular lesions were noted in the mid                            esophagus. Biopsies were taken with a cold forceps                            for histology.                           Multiple localized small erosions with no bleeding                            and no stigmata of recent bleeding were found in                            the gastric body and in the gastric antrum.                            Biopsies were taken with a cold forceps for                            histology.                           The examined  duodenum was normal. Biopsies were                            taken with a cold forceps for histology. Complications:            No immediate complications. Estimated Blood Loss:     Estimated blood loss was minimal. 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                           - White nummular lesions in esophageal mucosa.                            Biopsied.                           - Erosive gastropathy with no bleeding and no                            stigmata of recent bleeding. Biopsied.                           - Normal examined duodenum. Biopsied. Recommendation:           - Discharge patient to home (with escort).                           - Await pathology results.                           - Use Prilosec (omeprazole) 40 mg PO BID for 8                            weeks.                           - Return to GI clinic in 6 weeks.                           - The findings and recommendations were discussed                            with the patient. Sonny Masters  "Lyndee Leo" Lorenso Courier,  09/14/2021 3:56:00 PM

## 2021-09-14 NOTE — Patient Instructions (Addendum)
Await pathology  Continue your normal medications  Take Prilosec 40 mg twice daily for 8 weeks  Follow up office visit with Dr. Lorenso Courier Friday 10-26-21 at 10:30 am  YOU HAD AN ENDOSCOPIC PROCEDURE TODAY AT [place]:   Refer to the procedure report that was given to you for any specific questions about what was found during the examination.  If the procedure report does not answer your questions, please call your gastroenterologist to clarify.  If you requested that your care partner not be given the details of your procedure findings, then the procedure report has been included in a sealed envelope for you to review at your convenience later.  YOU SHOULD EXPECT: Some feelings of bloating in the abdomen. Passage of more gas than usual.  Walking can help get rid of the air that was put into your GI tract during the procedure and reduce the bloating.  Please Note:  You might notice some irritation and congestion in your nose or some drainage.  This is from the oxygen used during your procedure.  There is no need for concern and it should clear up in a day or so.  SYMPTOMS TO REPORT IMMEDIATELY:  Following upper endoscopy (EGD)  Vomiting of blood or coffee ground material  New chest pain or pain under the shoulder blades  Painful or persistently difficult swallowing  New shortness of breath  Fever of 100F or higher  Black, tarry-looking stools  For urgent or emergent issues, a gastroenterologist can be reached at any hour by calling [phone]. Do not use MyChart messaging for urgent concerns.    DIET:  We do recommend a small meal at first, but then you may proceed to your regular diet.  Drink plenty of fluids but you should avoid alcoholic beverages for 24 hours.  ACTIVITY:  You should plan to take it easy for the rest of today and you should NOT DRIVE or use heavy machinery until tomorrow (because of the sedation medicines used during the test).    FOLLOW UP: Our  staff will call the number listed on your records 48-72 hours following your procedure to check on you and address any questions or concerns that you may have regarding the information given to you following your procedure. If we do not reach you, we will leave a message.  We will attempt to reach you two times.  During this call, we will ask if you have developed any symptoms of COVID 19. If you develop any symptoms (ie: fever, flu-like symptoms, shortness of breath, cough etc.) before then, please call [phone].  If you test positive for Covid 19 in the 2 weeks post procedure, please call and report this information to [place].    If any biopsies were taken you will be contacted by phone or by letter within the next 1-3 weeks.  Please call us at [phone] if you have not heard about the biopsies in 3 weeks.    SIGNATURES/CONFIDENTIALITY: You and/or your care partner have signed paperwork which will be entered into your electronic medical record.  These signatures attest to the fact that that the information above on your After Visit Summary has been reviewed and is understood.  Full responsibility of the confidentiality of this discharge information lies with you and/or your care-partner.

## 2021-09-14 NOTE — Progress Notes (Unsigned)
A and O x3. Report to RN. Tolerated MAC anesthesia well.Teeth unchanged after procedure. 

## 2021-09-14 NOTE — Progress Notes (Unsigned)
I have reviewed the patient's medical history in detail and updated the computerized patient record.   VS BY DT. 

## 2021-09-14 NOTE — Progress Notes (Unsigned)
GASTROENTEROLOGY PROCEDURE H&P NOTE   Primary Care Physician: Cari Caraway, MD    Reason for Procedure:   Abdominal discomfort, GERD  Plan:    EGD  Patient is appropriate for endoscopic procedure(s) in the ambulatory ([facility]) setting.  The nature of the procedure, as well as the risks, benefits, and alternatives were carefully and thoroughly reviewed with the patient. Ample time for discussion and questions allowed. The patient understood, was satisfied, and agreed to proceed.     HPI: [name] is a 71 y.o. female who presents for EGD for evaluation of abdominal discomfort, GERD .  Patient was most recently seen in the Gastroenterology Clinic on 08/03/21.  No interval change in medical history since that appointment. Please refer to that note for full details regarding GI history and clinical presentation.   Past Medical History:  Diagnosis Date   Arthritis    Neck   CIN I (cervical intraepithelial neoplasia I) 1655   Complication of anesthesia    delayed SVT:  can occur from a few hours to 3 days after the general anesthesia or conscious sedation as happened with her colonoscopy.    Dysplasia of cervix, low grade (CIN 1)    LEEP cervical conization September 2017 margins negative CIN-1   GERD (gastroesophageal reflux disease)    Hashimoto's thyroiditis    History of carpal tunnel syndrome    Left   History of colon polyps 2010   Benign   Hyperlipidemia    Hypothyroidism    Hashimoto's per pt    Osteopenia 10/2017   T score -1.9 FRAX 8.8% / 0.7%.  Stable from prior DEXA.   SVT (supraventricular tachycardia) ([place])    Wears glasses     Past Surgical History:  Procedure Laterality Date   BREAST SURGERY  1976   CYST REMOVED   CARDIAC ELECTROPHYSIOLOGY STUDY AND ABLATION  2001   CESAREAN SECTION  1987   COLONOSCOPY     COLONOSCOPY WITH PROPOFOL N/A 02/12/2019   Procedure: COLONOSCOPY WITH PROPOFOL;  Surgeon: Wonda Horner, MD;  Location: [facility];   Service: Endoscopy;  Laterality: N/A;   LEEP  2017   left thumb surgery and Carpal Tunnel Release  9/14   lower lid surgery   09/2018   dry eye from Hashimoto's   POLYPECTOMY  02/12/2019   Procedure: POLYPECTOMY;  Surgeon: Wonda Horner, MD;  Location: [facility];  Service: Endoscopy;;    Prior to Admission medications   Medication Sig Start Date End Date Taking? Authorizing Provider  Cholecalciferol (VITAMIN D-3) 125 MCG (5000 UT) TABS Take 5,000 Units by mouth See admin instructions. Monday and Friday 06/01/21  Yes Antonieta Pert, MD  ezetimibe (ZETIA) 10 MG tablet Take 1 tablet (10 mg total) by mouth at bedtime. 08/28/21  Yes Freada Bergeron, MD  levothyroxine (SYNTHROID) 75 MCG tablet Take 75 mcg by mouth daily before breakfast.   Yes [provider]  losartan (COZAAR) 25 MG tablet Take 1 tablet (25 mg total) by mouth daily. 06/07/21  Yes Freada Bergeron, MD  metoprolol succinate (TOPROL-XL) 50 MG 24 hr tablet Take 1 tablet (50 mg total) by mouth daily. Take with or immediately following a meal. 10/09/20  Yes Camnitz, Ocie Doyne, MD  Multiple Vitamin (MULTIVITAMIN WITH MINERALS) TABS tablet Take 1 tablet by mouth daily.   Yes [provider]  Polyvinyl Alcohol-Povidone PF (REFRESH) 1.4-0.6 % SOLN Apply 1 drop to eye 3 (three) times daily as needed (for dry eyes).  Yes [provider]  pravastatin (PRAVACHOL) 20 MG tablet Take 1 tablet (20 mg total) by mouth every evening. 12/05/20  Yes Freada Bergeron, MD  Probiotic Product (PROBIOTIC ADVANCED PO) Take 1 capsule by mouth daily.    Yes [provider]  RESTASIS 0.05 % ophthalmic emulsion Place 1 drop into both eyes 2 (two) times daily. 05/21/21  Yes [provider]  dicyclomine (BENTYL) 20 MG tablet Take 1 tablet (20 mg total) by mouth 2 (two) times daily as needed for spasms. 08/23/21   Sharyn Creamer, MD  fluticasone (FLONASE) 50 MCG/ACT nasal spray Place 2 sprays into both nostrils  daily. 11/19/20   Scot Jun, FNP  ibuprofen (ADVIL) 200 MG tablet Take 400-600 mg by mouth daily as needed for moderate pain.    [provider]  polyethylene glycol (MIRALAX / GLYCOLAX) packet Take 17 g by mouth daily as needed for mild constipation.     [provider]    Current Outpatient Medications  Medication Sig Dispense Refill   Cholecalciferol (VITAMIN D-3) 125 MCG (5000 UT) TABS Take 5,000 Units by mouth See admin instructions. Monday and Friday 30 tablet    ezetimibe (ZETIA) 10 MG tablet Take 1 tablet (10 mg total) by mouth at bedtime. 90 tablet 3   levothyroxine (SYNTHROID) 75 MCG tablet Take 75 mcg by mouth daily before breakfast.     losartan (COZAAR) 25 MG tablet Take 1 tablet (25 mg total) by mouth daily. 90 tablet 2   metoprolol succinate (TOPROL-XL) 50 MG 24 hr tablet Take 1 tablet (50 mg total) by mouth daily. Take with or immediately following a meal. 90 tablet 3   Multiple Vitamin (MULTIVITAMIN WITH MINERALS) TABS tablet Take 1 tablet by mouth daily.     Polyvinyl Alcohol-Povidone PF (REFRESH) 1.4-0.6 % SOLN Apply 1 drop to eye 3 (three) times daily as needed (for dry eyes).     pravastatin (PRAVACHOL) 20 MG tablet Take 1 tablet (20 mg total) by mouth every evening. 30 tablet 11   Probiotic Product (PROBIOTIC ADVANCED PO) Take 1 capsule by mouth daily.      RESTASIS 0.05 % ophthalmic emulsion Place 1 drop into both eyes 2 (two) times daily.     dicyclomine (BENTYL) 20 MG tablet Take 1 tablet (20 mg total) by mouth 2 (two) times daily as needed for spasms. 30 tablet 1   fluticasone (FLONASE) 50 MCG/ACT nasal spray Place 2 sprays into both nostrils daily. 16 g 0   ibuprofen (ADVIL) 200 MG tablet Take 400-600 mg by mouth daily as needed for moderate pain.     polyethylene glycol (MIRALAX / GLYCOLAX) packet Take 17 g by mouth daily as needed for mild constipation.      Current Facility-Administered Medications  Medication Dose Route Frequency  Provider Last Rate Last Admin   0.9 %  sodium chloride infusion  500 mL Intravenous Once Sharyn Creamer, MD        Allergies as of 09/14/2021 - Review Complete 09/14/2021  Allergen Reaction Noted   Diphenhydramine Palpitations 01/19/2018   Epinephrine Palpitations 03/04/2014   Lipitor [atorvastatin] Other (See Comments) 01/20/2013   Praluent [alirocumab]  08/30/2020   Repatha [evolocumab]  11/04/2019    Family History  Problem Relation Age of Onset   Hypertension Mother    Heart disease Mother    Mitral valve prolapse Mother    Glaucoma Mother    Arthritis Mother    Von Willebrand disease Mother  Stroke Mother    Rheum arthritis Mother    Neuropathy Mother    Hypertension Father    Heart disease Father    Colon polyps Father    Glaucoma Father    Arthritis Father    Stroke Father    Rheum arthritis Father    Pulmonary embolism Father    Arthritis Sister    Stroke Brother        TIA   Hypertension Brother    Mitral valve prolapse Brother    Glaucoma Brother    Arthritis Brother     Social History   Socioeconomic History   Marital status: Widowed    Spouse name: Not on file   Number of children: 1   Years of education: Not on file   Highest education level: Master's degree (e.g., MA, MS, MEng, MEd, MSW, MBA)  Occupational History   Not on file  Tobacco Use   Smoking status: Never   Smokeless tobacco: Never  Vaping Use   Vaping Use: Never used  Substance and Sexual Activity   Alcohol use: Not Currently   Drug use: No   Sexual activity: Not Currently    Partners: Male    Birth control/protection: Post-menopausal    Comment: 71 YEARS OLD, NO MORE THAN 5 PARTNERS ,des neg  Other Topics Concern   Not on file  Social History Narrative   Her daughter lives at home with her    Right handed   Caffeine: coffee, soda, (about 1 cup/day)   Social Determinants of Health   Financial Resource Strain: Not on file  Food Insecurity: Not on file  Transportation  Needs: Not on file  Physical Activity: Not on file  Stress: Not on file  Social Connections: Not on file  Intimate Partner Violence: Not on file    Physical Exam: Vital signs in last 24 hours: BP 140/83   Pulse 72   Temp (!) 97.5 F (36.4 C) (Temporal)   Ht '5\' 3"'$  (1.6 m)   Wt 168 lb (76.2 kg)   LMP 11/26/2000   SpO2 99%   BMI 29.76 kg/m  GEN: NAD EYE: Sclerae anicteric ENT: MMM CV: Non-tachycardic Pulm: No increased WOB GI: Soft NEURO:  Alert & Oriented   Christia Reading, MD [facility]   09/14/2021 3:38 PM

## 2021-09-18 ENCOUNTER — Telehealth: Payer: Self-pay | Admitting: *Deleted

## 2021-09-18 NOTE — Telephone Encounter (Signed)
  Follow up Call-     09/14/2021    2:39 PM  Call back number  Post procedure Call Back phone  # [phone]  Permission to leave phone message Yes     Patient questions:  Do you have a fever, pain , or abdominal swelling? No. Pain Score  0 *  Have you tolerated food without any problems? Yes.    Have you been able to return to your normal activities? Yes.    Do you have any questions about your discharge instructions: Diet   No. Medications  No. Follow up visit  No.  Do you have questions or concerns about your Care? No.  Actions: * If pain score is 4 or above: No action needed, pain <4.

## 2021-09-18 NOTE — Telephone Encounter (Signed)
First attempt, left VM.  

## 2021-09-19 NOTE — Progress Notes (Unsigned)
Cardiology Office Note:    Date:  09/26/2021   ID:  [name], DOB 11-18-1950, MRN [id number]  PCP:  Cari Caraway, [place]  Cardiologist:  Fransico Him, MD  Advanced Practice Provider:  No care team member to display Electrophysiologist:  Will Meredith Leeds, MD    Referring MD: Cari Caraway, MD    History of Present Illness:    [name] is a 71 y.o. female with a hx of HTN, HLD, paroxysmal SVT s/p ablation, and hypothyroidism who was previously followed by Dr. [name] who presents to for CV follow-up.  The patient has a long history of paroxysmal SVT. Underwent ablation for SVT at [facility] in 2001.  Since then she has had infrequent episodes of SVT.  Many of her episodes have occurred following surgical procedures after administration of anesthesia.  She has had an echocardiogram on 02/02/13 which showed normal left ventricular function with ejection fraction 55-60% and no significant abnormalities. Cardiac monitor 12/17/2017 showed normal sinus rhythm, sinus bradycardia, sinus tachycardia with some nonsustained atrial tachycardia up to 13 beats and occasional PVCs with PVC burden of 1%.  Heart rate ranged from 42 to 185 bpm. Has been followed by Dr. Curt Bears with last visit in 2020 where she was doing well and was continued on her metoprolol.  During visit on 06/19/20, the patient was having elevated blood pressures running 130s-170s. Also had episode of SVT in 04/2020. Following our visit on 06/23/20, she called the on-call line due to HR 142. She was instructed to take additional doses of her metop at that time. She was seen in EP clinic on 06/30/20 where she was recommended for possible ablation if symptoms recurred.  During visit on 09/21/20, the patient had another episode of SVT after a dental procedure.She went to [facility] where HR were mildly elevated at that time. Received IVF and toradol with resolution of  symptoms and was discharged home. Ca score was also obtained on 10/31/20 which was 0.   During last visit on 06/07/2021, the patient was suffering with episodes of mouth dryness, fatigue and numbness and was being worked up for possible rheumatologic condition. She was also having brief runs of SVT at that time. TTE 63/84/66 for systolic murmur showed normal BiV function with no evidence of valve disease  Today, the patient overall feels okay. Continues to have muscle aches and is seeing both Sports Medicine and undergoing acupuncture which has been helping. Rheumatologic work-up was negative. She is wondering if the pravastatin is causing her muscle aches. Otherwise, no recurrent palpitations. She remains active without chest pain, SOB. No dizziness or syncope.    Past Medical History:  Diagnosis Date   Arthritis    Neck   CIN I (cervical intraepithelial neoplasia I) 5993   Complication of anesthesia    delayed SVT:  can occur from a few hours to 3 days after the general anesthesia or conscious sedation as happened with her colonoscopy.    Dysplasia of cervix, low grade (CIN 1)    LEEP cervical conization September 2017 margins negative CIN-1   GERD (gastroesophageal reflux disease)    Hashimoto's thyroiditis    History of carpal tunnel syndrome    Left   History of colon polyps 2010   Benign   Hyperlipidemia    Hypothyroidism    Hashimoto's per pt    Osteopenia 10/2017   T score -1.9 FRAX 8.8% / 0.7%.  Stable from prior DEXA.  SVT (supraventricular tachycardia) ([place])    Wears glasses     Past Surgical History:  Procedure Laterality Date   BREAST SURGERY  1976   CYST REMOVED   CARDIAC ELECTROPHYSIOLOGY STUDY AND ABLATION  2001   CESAREAN SECTION  1987   COLONOSCOPY     COLONOSCOPY WITH PROPOFOL N/A 02/12/2019   Procedure: COLONOSCOPY WITH PROPOFOL;  Surgeon: Wonda Horner, MD;  Location: [facility] ENDOSCOPY;  Service: Endoscopy;  Laterality: N/A;   LEEP  2017   left thumb surgery  and Carpal Tunnel Release  9/14   lower lid surgery   09/2018   dry eye from Hashimoto's   POLYPECTOMY  02/12/2019   Procedure: POLYPECTOMY;  Surgeon: Wonda Horner, MD;  Location: [facility] ENDOSCOPY;  Service: Endoscopy;;    Current Medications: Current Meds  Medication Sig   Cholecalciferol (VITAMIN D-3) 125 MCG (5000 UT) TABS Take 5,000 Units by mouth See admin instructions. Monday and Friday   dicyclomine (BENTYL) 20 MG tablet Take 1 tablet (20 mg total) by mouth 2 (two) times daily as needed for spasms.   ezetimibe (ZETIA) 10 MG tablet Take 1 tablet (10 mg total) by mouth at bedtime.   fluticasone (FLONASE) 50 MCG/ACT nasal spray Place 2 sprays into both nostrils daily.   ibuprofen (ADVIL) 200 MG tablet Take 400-600 mg by mouth daily as needed for moderate pain.   levothyroxine (SYNTHROID) 75 MCG tablet Take 75 mcg by mouth daily before breakfast.   losartan (COZAAR) 25 MG tablet Take 1 tablet (25 mg total) by mouth daily.   metoprolol succinate (TOPROL-XL) 50 MG 24 hr tablet Take 1 tablet (50 mg total) by mouth daily. Take with or immediately following a meal.   Multiple Vitamin (MULTIVITAMIN WITH MINERALS) TABS tablet Take 1 tablet by mouth daily.   omeprazole (PRILOSEC) 40 MG capsule Take 1 capsule (40 mg total) by mouth 2 (two) times daily. Take 1 tablet twice daily for 8 weeks- 1 tablet 30 minutes before breakfast and supper   polyethylene glycol (MIRALAX / GLYCOLAX) packet Take 17 g by mouth daily as needed for mild constipation.    Polyvinyl Alcohol-Povidone PF (REFRESH) 1.4-0.6 % SOLN Apply 1 drop to eye 3 (three) times daily as needed (for dry eyes).   pravastatin (PRAVACHOL) 20 MG tablet Take 1 tablet (20 mg total) by mouth every evening.   Probiotic Product (PROBIOTIC ADVANCED PO) Take 1 capsule by mouth daily.    RESTASIS 0.05 % ophthalmic emulsion Place 1 drop into both eyes 2 (two) times daily.   Current Facility-Administered Medications for the 09/26/21 encounter (Office Visit)  with Freada Bergeron, MD  Medication   0.9 %  sodium chloride infusion     Allergies:   Diphenhydramine, Epinephrine, Lipitor [atorvastatin], Praluent [alirocumab], and Repatha [evolocumab]   Social History   Socioeconomic History   Marital status: Widowed    Spouse name: Not on file   Number of children: 1   Years of education: Not on file   Highest education level: Master's degree (e.g., MA, MS, MEng, MEd, MSW, MBA)  Occupational History   Not on file  Tobacco Use   Smoking status: Never   Smokeless tobacco: Never  Vaping Use   Vaping Use: Never used  Substance and Sexual Activity   Alcohol use: Not Currently   Drug use: No   Sexual activity: Not Currently    Partners: Male    Birth control/protection: Post-menopausal    Comment: 71 YEARS OLD, NO MORE THAN 5  PARTNERS ,des neg  Other Topics Concern   Not on file  Social History Narrative   Her daughter lives at home with her    Right handed   Caffeine: coffee, soda, (about 1 cup/day)   Social Determinants of Health   Financial Resource Strain: Not on file  Food Insecurity: Not on file  Transportation Needs: Not on file  Physical Activity: Not on file  Stress: Not on file  Social Connections: Not on file     Family History: The patient's family history includes Arthritis in her brother, father, mother, and sister; Colon polyps in her father; Glaucoma in her brother, father, and mother; Heart disease in her father and mother; Hypertension in her brother, father, and mother; Mitral valve prolapse in her brother and mother; Neuropathy in her mother; Pulmonary embolism in her father; Rheum arthritis in her father and mother; Stroke in her brother, father, and mother; Von Willebrand disease in her mother.  ROS:   Please see the history of present illness.    Review of Systems  Constitutional:  Positive for malaise/fatigue. Negative for chills and fever.  HENT:  Negative for hearing loss. Sore throat: Dry Mouth.   Eyes:  Negative for blurred vision.  Respiratory:  Negative for shortness of breath.   Cardiovascular:  Negative for chest pain, palpitations, orthopnea, claudication, leg swelling and PND.  Gastrointestinal:  Negative for nausea and vomiting.  Genitourinary:  Negative for dysuria.  Musculoskeletal:  Positive for joint pain and myalgias. Negative for back pain, falls and neck pain.  Neurological:  Negative for dizziness and loss of consciousness. Weakness: RLE. Psychiatric/Behavioral:  Negative for substance abuse.    EKGs/Labs/Other Studies Reviewed:    The following studies were reviewed today: TTE 06-08-21: IMPRESSIONS     1. Left ventricular ejection fraction, by estimation, is 60 to 65%. The  left ventricle has normal function. The left ventricle has no regional  wall motion abnormalities. Left ventricular diastolic parameters were  normal.   2. Right ventricular systolic function is normal. The right ventricular  size is normal. There is normal pulmonary artery systolic pressure. The  estimated right ventricular systolic pressure is 80.9 mmHg.   3. The mitral valve is normal in structure. Trivial mitral valve  regurgitation. No evidence of mitral stenosis.   4. The aortic valve is tricuspid. Aortic valve regurgitation is trivial.  Aortic valve sclerosis is present, with no evidence of aortic valve  stenosis.   5. The inferior vena cava is normal in size with greater than 50%  respiratory variability, suggesting right atrial pressure of 3 mmHg.   FINDINGS  Ca score 10/31/20: FINDINGS: Coronary Calcium Score:   Left main: 0   Left anterior descending artery: 0   Left circumflex artery: 0   Right coronary artery: 0   Total: 0   Percentile: 0   Pericardium: Normal.   Ascending Aorta: Normal caliber.  Mild aortic atherosclerosis.   Non-cardiac: See separate report from [facility].   IMPRESSION: Coronary calcium score of 0. This was 0 percentile for  age-, race-, and sex-matched controls.   Mild aortic atherosclerosis.  Cardiac monitor 01/26/18 - personally reviewed Normal sinus rhythm, sinus bradycardia and sinus tachycardia with average heart rate 73 bpm. Heart rate ranged from 42 to 185 bpm. Occasional PVCs with PVC burden 1%. Nonsustained atrial tachycardia up to 13 beats.  TTE 2014: ------------------------------------------------------------  Left ventricle:  The cavity size was normal. Systolic  function was normal. The estimated ejection fraction was in  the range of 55% to 60%. Wall motion was normal; there were  no regional wall motion abnormalities.   ------------------------------------------------------------  Aortic valve:   Trileaflet; normal thickness leaflets.  Mobility was not restricted.  Doppler:  Transvalvular  velocity was within the normal range. There was no stenosis.   No regurgitation.   ------------------------------------------------------------  Aorta:  Aortic root: The aortic root was normal in size.   ------------------------------------------------------------  Mitral valve:   Structurally normal valve.   Mobility was  not restricted.  Doppler:  Transvalvular velocity was within  the normal range. There was no evidence for stenosis.  Trivial regurgitation.    Peak gradient: 31m Hg (D).   ------------------------------------------------------------  Left atrium:  The atrium was normal in size.   ------------------------------------------------------------  Right ventricle:  The cavity size was normal. Wall thickness  was normal. Systolic function was normal.   ------------------------------------------------------------  Pulmonic valve:    Doppler:  Transvalvular velocity was  within the normal range. There was no evidence for stenosis.     ------------------------------------------------------------  Tricuspid valve:   Structurally normal valve.    Doppler:  Transvalvular velocity was  within the normal range.  Trivial  regurgitation.   ------------------------------------------------------------  Pulmonary artery:   The main pulmonary artery was  normal-sized. Systolic pressure was within the normal range.     ------------------------------------------------------------  Right atrium:  The atrium was normal in size.   ------------------------------------------------------------  Pericardium:  There was no pericardial effusion.   ------------------------------------------------------------  Systemic veins:  Inferior vena cava: The vessel was normal in size.    EKG: No new ECG today  Recent Labs: 02/19/2021: B Natriuretic Peptide 10.6 05/31/2021: TSH 1.008 06/01/2021: BUN 17; Creatinine, Ser 0.93; Hemoglobin 14.4; Magnesium 2.5; Platelets 177; Potassium 4.0; Sodium 140 06/25/2021: ALT 21  Recent Lipid Panel    Component Value Date/Time   CHOL 158 06/25/2021 1035   TRIG 91 06/25/2021 1035   HDL 63 06/25/2021 1035   CHOLHDL 2.5 06/25/2021 1035   CHOLHDL 3.4 12/27/2014 1055   VLDL 26 12/27/2014 1055   LDLCALC 78 06/25/2021 1035     Physical Exam:    VS:  BP 130/84   Pulse 68   Ht '5\' 3"'$  (1.6 m)   Wt 169 lb 12.8 oz (77 kg)   LMP 11/26/2000   SpO2 97%   BMI 30.08 kg/m     Wt Readings from Last 3 Encounters:  09/26/21 169 lb 12.8 oz (77 kg)  09/14/21 168 lb (76.2 kg)  09/04/21 169 lb (76.7 kg)    GEN:  Well nourished, well developed in no acute distress HEENT: Normal NECK: No JVD; No carotid bruits CARDIAC: RRR, 2/6 systolic murmur, no rubs or gallops RESPIRATORY:  Clear to auscultation without rales, wheezing or rhonchi  ABDOMEN: Soft, non-tender, non-distended MUSCULOSKELETAL:  No edema; No deformity  SKIN: Warm and dry NEUROLOGIC:  Alert and oriented x 3 PSYCHIATRIC:  Normal affect   ASSESSMENT:    1. SVT (supraventricular tachycardia) ([place]   2. Mixed hyperlipidemia   3. Primary hypertension   4. Statin myopathy      PLAN:    In  order of problems listed above:  #Paroxysmal SVT s/p Ablation: [facility]. TTE in 2014 with normal LVEF, no significant valvular disease. Had several recurrences mainly after surgical procedures and administration of anesthesia. Had recurrence of SVT in 04/2020 and 06/2020. Possible plan for repeat ablation if symptoms continue to recur. -Follow-up with  Dr. Curt Bears as scheduled -Continue metop '50mg'$  XL -Discussed importance of maintaining hydration and avoiding excessive caffeine  #HTN: Blood pressure well controlled on current regimen and at goal <130/90. -Continue metop '50mg'$  XL as above -Will give losartan '25mg'$  daily if SBP>140  #HLD: #Statin Intolerance: Unable to tolerate statins due to myalgias and repatha stopped due to weight gain.  Now with recurrent joint pain on prava. Will trial off prava and see how she feels. If muscle aches improved, could see if eligible for nexlatol. Ca score 10/2020 0.  -Continue zetia'10mg'$  daily -Can trial off prava and see if muscle aches improve -Ca score 0 -LDL goal <100   Medication Adjustments/Labs and Tests Ordered: Current medicines are reviewed at length with the patient today.  Concerns regarding medicines are outlined above.  No orders of the defined types were placed in this encounter.  No orders of the defined types were placed in this encounter.    Patient Instructions  Medication Instructions:   Your physician recommends that you continue on your current medications as directed. Please refer to the Current Medication list given to you today.  *If you need a refill on your cardiac medications before your next appointment, please call your pharmacy*   Follow-Up: At [facility], you and your health needs are our priority.  As part of our continuing mission to provide you with exceptional heart care, we have created designated Provider Care Teams.  These Care Teams include your primary  Cardiologist (physician) and Advanced Practice Providers (APPs -  Physician Assistants and Nurse Practitioners) who all work together to provide you with the care you need, when you need it.  We recommend signing up for the patient portal called "MyChart".  Sign up information is provided on this After Visit Summary.  MyChart is used to connect with patients for Virtual Visits (Telemedicine).  Patients are able to view lab/test results, encounter notes, upcoming appointments, etc.  Non-urgent messages can be sent to your provider as well.   To learn more about what you can do with MyChart, go to [email or web address].    Your next appointment:   6 month(s)  The format for your next appointment:   In Person  Provider:   DR. Johney Frame  Important Information About Sugar        I,Mykaella Javier,acting as a scribe for Freada Bergeron, MD.,have documented all relevant documentation on the behalf of Freada Bergeron, MD,as directed by  Freada Bergeron, MD while in the presence of Freada Bergeron, MD.  I, Freada Bergeron, MD, have reviewed all documentation for this visit. The documentation on 09/26/21 for the exam, diagnosis, procedures, and orders are all accurate and complete.   Signed, Freada Bergeron, MD  09/26/2021 9:52 AM    [place]

## 2021-09-25 ENCOUNTER — Encounter: Payer: Self-pay | Admitting: Internal Medicine

## 2021-09-26 ENCOUNTER — Encounter: Payer: Self-pay | Admitting: Cardiology

## 2021-09-26 ENCOUNTER — Ambulatory Visit: Payer: Medicare HMO | Admitting: Cardiology

## 2021-09-26 VITALS — BP 130/84 | HR 68 | Ht 63.0 in | Wt 169.8 lb

## 2021-09-26 DIAGNOSIS — T466X5A Adverse effect of antihyperlipidemic and antiarteriosclerotic drugs, initial encounter: Secondary | ICD-10-CM

## 2021-09-26 DIAGNOSIS — G720 Drug-induced myopathy: Secondary | ICD-10-CM

## 2021-09-26 DIAGNOSIS — E782 Mixed hyperlipidemia: Secondary | ICD-10-CM

## 2021-09-26 DIAGNOSIS — I10 Essential (primary) hypertension: Secondary | ICD-10-CM | POA: Diagnosis not present

## 2021-09-26 DIAGNOSIS — I471 Supraventricular tachycardia: Secondary | ICD-10-CM | POA: Diagnosis not present

## 2021-09-26 NOTE — Patient Instructions (Signed)
Medication Instructions:   Your physician recommends that you continue on your current medications as directed. Please refer to the Current Medication list given to you today.  *If you need a refill on your cardiac medications before your next appointment, please call your pharmacy*   Follow-Up: At [facility], you and your health needs are our priority.  As part of our continuing mission to provide you with exceptional heart care, we have created designated Provider Care Teams.  These Care Teams include your primary Cardiologist (physician) and Advanced Practice Providers (APPs -  Physician Assistants and Nurse Practitioners) who all work together to provide you with the care you need, when you need it.  We recommend signing up for the patient portal called "MyChart".  Sign up information is provided on this After Visit Summary.  MyChart is used to connect with patients for Virtual Visits (Telemedicine).  Patients are able to view lab/test results, encounter notes, upcoming appointments, etc.  Non-urgent messages can be sent to your provider as well.   To learn more about what you can do with MyChart, go to [email or web address].    Your next appointment:   6 month(s)  The format for your next appointment:   In Person  Provider:   DR. PEMBERTON  Important Information About Sugar       

## 2021-10-01 NOTE — Progress Notes (Signed)
[name] D.[place] Phone: [phone]   Assessment and Plan:     1. Neck pain 2. DDD (degenerative disc disease), lumbar 3. Somatic dysfunction of cervical region 4. Somatic dysfunction of thoracic region 5. Somatic dysfunction of lumbar region 6. Somatic dysfunction of pelvic region 7. Somatic dysfunction of rib region -Chronic with exacerbation, subsequent visit - Recurrence of multiple musculoskeletal complaints with most prominent being in right gluteal musculature and neck, though overall musculoskeletal pains have not improved with regular OMT treatments - Start HEP for gluteal - Patient has received significant relief with OMT in the past.  Elects for repeat OMT today.  Tolerated well per note below. - Decision today to treat with OMT was based on Physical Exam   After verbal consent patient was treated with HVLA (high velocity low amplitude), ME (muscle energy), FPR (flex positional release), ST (soft tissue), PC/PD (Pelvic Compression/ Pelvic Decompression) techniques in cervical, rib, thoracic, lumbar, and pelvic areas. Patient tolerated the procedure well with improvement in symptoms.  Patient educated on potential side effects of soreness and recommended to rest, hydrate, and use Tylenol as needed for pain control.   Pertinent previous records reviewed include none   Follow Up: 4 weeks for repeat OMT maintenance and continued treatment   Subjective:   I, [name], am serving as a Education administrator for Doctor Glennon Mac  Chief Complaint: OMT follow up    HPI:  03/23/21 Patient with chronic back/neck pain hoping to avoid surgical intervention.   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 She feels like she is doing better. Meloxicam is helping    05/25/2021 Patient states that she's okay will hopefully be getting a CT scan soon , just here for some maintenance     06/25/2021 Patient states that she's feeling pretty good isn't in as much pain as she had been having still stiff at times needs her adjustment    07/23/2021 Patient states time for a tune up, neck and shoulder are tight giving her a little bit of a headache    08/13/2021 Patient states that she is doing the neck and shoulder are tight still getting a headache   5/16/023 Patient states she is doing pain is intermittent but over all better   10/02/2021 Patient states time for a tune up      Relevant Historical Information: Hashimoto's, DDD lumbar spine, history of SVT  Additional pertinent review of systems negative.  Current Outpatient Medications  Medication Sig Dispense Refill   Cholecalciferol (VITAMIN D-3) 125 MCG (5000 UT) TABS Take 5,000 Units by mouth See admin instructions. Monday and Friday 30 tablet    ezetimibe (ZETIA) 10 MG tablet Take 1 tablet (10 mg total) by mouth at bedtime. 90 tablet 3   levothyroxine (SYNTHROID) 75 MCG tablet Take 75 mcg by mouth daily before breakfast.     losartan (COZAAR) 25 MG tablet Take 1 tablet (25 mg total) by mouth daily. 90 tablet 2   metoprolol succinate (TOPROL-XL) 50 MG 24 hr tablet Take 1 tablet (50 mg total) by mouth  daily. Take with or immediately following a meal. 90 tablet 3   Multiple Vitamin (MULTIVITAMIN WITH MINERALS) TABS tablet Take 1 tablet by mouth daily.     omeprazole (PRILOSEC) 40 MG capsule Take 1 capsule (40 mg total) by mouth 2 (two) times daily. Take 1 tablet twice daily for 8 weeks- 1 tablet 30 minutes before breakfast and supper 60 capsule 2    polyethylene glycol (MIRALAX / GLYCOLAX) packet Take 17 g by mouth daily as needed for mild constipation.      Polyvinyl Alcohol-Povidone PF (REFRESH) 1.4-0.6 % SOLN Apply 1 drop to eye 3 (three) times daily as needed (for dry eyes).     pravastatin (PRAVACHOL) 20 MG tablet Take 1 tablet (20 mg total) by mouth every evening. 30 tablet 11   Probiotic Product (PROBIOTIC ADVANCED PO) Take 1 capsule by mouth daily.      RESTASIS 0.05 % ophthalmic emulsion Place 1 drop into both eyes 2 (two) times daily.     dicyclomine (BENTYL) 20 MG tablet Take 1 tablet (20 mg total) by mouth 2 (two) times daily as needed for spasms. 30 tablet 1   fluticasone (FLONASE) 50 MCG/ACT nasal spray Place 2 sprays into both nostrils daily. 16 g 0   ibuprofen (ADVIL) 200 MG tablet Take 400-600 mg by mouth daily as needed for moderate pain.     Current Facility-Administered Medications  Medication Dose Route Frequency Provider Last Rate Last Admin   0.9 %  sodium chloride infusion  500 mL Intravenous Once Sharyn Creamer, MD          Objective:     Vitals:   10/02/21 1321  BP: 116/80  Pulse: 74  SpO2: 98%  Weight: 164 lb (74.4 kg)  Height: '5\' 3"'$  (1.6 m)      Body mass index is 29.05 kg/m.    Physical Exam:     General: Well-appearing, cooperative, sitting comfortably in no acute distress.   OMT Physical Exam:   ASIS Compression Test: Positive Right Cervical: TTP paraspinal, C5 RR SL Sacrum: Positive sphinx, TTP left sacral base Thoracic: TTP paraspinal, decreased motion through thoracic spine Lumbar: TTP paraspinal, L1-3 RRSL, L5 RL Pelvis: Right anterior innominate  Electronically signed by:  [name] D.[facility] 2:05 PM 10/02/21

## 2021-10-02 ENCOUNTER — Ambulatory Visit: Payer: Medicare HMO | Admitting: Sports Medicine

## 2021-10-02 VITALS — BP 116/80 | HR 74 | Ht 63.0 in | Wt 164.0 lb

## 2021-10-02 DIAGNOSIS — M9903 Segmental and somatic dysfunction of lumbar region: Secondary | ICD-10-CM | POA: Diagnosis not present

## 2021-10-02 DIAGNOSIS — M9902 Segmental and somatic dysfunction of thoracic region: Secondary | ICD-10-CM

## 2021-10-02 DIAGNOSIS — M51369 Other intervertebral disc degeneration, lumbar region without mention of lumbar back pain or lower extremity pain: Secondary | ICD-10-CM

## 2021-10-02 DIAGNOSIS — M9905 Segmental and somatic dysfunction of pelvic region: Secondary | ICD-10-CM

## 2021-10-02 DIAGNOSIS — M9901 Segmental and somatic dysfunction of cervical region: Secondary | ICD-10-CM | POA: Diagnosis not present

## 2021-10-02 DIAGNOSIS — M542 Cervicalgia: Secondary | ICD-10-CM

## 2021-10-02 DIAGNOSIS — M5136 Other intervertebral disc degeneration, lumbar region: Secondary | ICD-10-CM | POA: Diagnosis not present

## 2021-10-02 DIAGNOSIS — M9908 Segmental and somatic dysfunction of rib cage: Secondary | ICD-10-CM

## 2021-10-02 NOTE — Patient Instructions (Addendum)
Good to see you  Glute HEP 4 week follow up for repeat OMT

## 2021-10-12 ENCOUNTER — Other Ambulatory Visit: Payer: Self-pay | Admitting: Cardiology

## 2021-10-12 ENCOUNTER — Encounter: Payer: Self-pay | Admitting: Cardiology

## 2021-10-22 ENCOUNTER — Ambulatory Visit: Admit: 2021-10-22 | Payer: PRIVATE HEALTH INSURANCE | Attending: Internal Medicine | Primary: Family Medicine

## 2021-10-26 ENCOUNTER — Other Ambulatory Visit: Payer: Self-pay | Admitting: Cardiology

## 2021-10-26 ENCOUNTER — Ambulatory Visit: Payer: Medicare HMO | Admitting: Internal Medicine

## 2021-10-26 ENCOUNTER — Encounter: Payer: Self-pay | Admitting: Internal Medicine

## 2021-10-26 VITALS — BP 100/70 | HR 60 | Ht 61.0 in | Wt 168.5 lb

## 2021-10-26 DIAGNOSIS — D135 Benign neoplasm of extrahepatic bile ducts: Secondary | ICD-10-CM

## 2021-10-26 DIAGNOSIS — K259 Gastric ulcer, unspecified as acute or chronic, without hemorrhage or perforation: Secondary | ICD-10-CM | POA: Diagnosis not present

## 2021-10-26 DIAGNOSIS — K219 Gastro-esophageal reflux disease without esophagitis: Secondary | ICD-10-CM | POA: Diagnosis not present

## 2021-10-26 DIAGNOSIS — K5900 Constipation, unspecified: Secondary | ICD-10-CM | POA: Diagnosis not present

## 2021-10-26 MED ORDER — OMEPRAZOLE 40 MG PO CPDR: 40.0000 mg | DELAYED_RELEASE_CAPSULE | Freq: Every day | ORAL | 2 refills | Status: DC

## 2021-10-26 NOTE — Progress Notes (Signed)
Chief Complaint: Abdominal discomfort  HPI : 71 year old female with history of SVT, hypothyroidism presents for follow up of abdominal discomfort  Interval History: Patient is overall doing quite well.  Her bowel habits are regular.  Denies ab pain, chest burning, regurgitation. She does have a little gas but this is not bothersome. She has not needed the Miralax and is not using any of the Bentyl medication. She is taking the omperazole 40 mg BID, which she feels like has helped with her symptoms.  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  Current Outpatient Medications  Medication Sig Dispense Refill   Cholecalciferol (VITAMIN D-3) 125 MCG (5000 UT) TABS Take 5,000 Units by mouth See admin instructions. Monday and Friday 30 tablet    ezetimibe (ZETIA) 10 MG tablet Take 1 tablet (10 mg total) by mouth at bedtime. 90 tablet 3   levothyroxine (SYNTHROID) 75 MCG tablet Take 75 mcg by mouth daily before breakfast.     losartan (COZAAR) 25 MG tablet Take 1 tablet (25 mg total) by mouth daily. 90 tablet 2   Multiple Vitamin (MULTIVITAMIN WITH MINERALS) TABS tablet Take 1 tablet by mouth daily.     polyethylene glycol (MIRALAX / GLYCOLAX) packet Take 17 g by mouth daily as needed for mild constipation.      Polyvinyl Alcohol-Povidone PF (REFRESH) 1.4-0.6 % SOLN Apply 1 drop to eye 3 (three) times daily as needed (for dry eyes).     pravastatin (PRAVACHOL) 20 MG tablet Take 1 tablet (20 mg total) by mouth every evening. 30 tablet 11   Probiotic Product (PROBIOTIC ADVANCED PO) Take 1 capsule by mouth daily.      RESTASIS 0.05 % ophthalmic emulsion Place 1 drop into both eyes 2 (two) times daily.     White Petrolatum-Mineral Oil (SYSTANE NIGHTTIME) OINT SMARTSIG:1 Inch(es) In Eye(s) Every Night     dicyclomine (BENTYL) 20 MG tablet Take 1 tablet (20 mg total) by mouth 2 (two) times daily as needed for  spasms. 30 tablet 1   fluticasone (FLONASE) 50 MCG/ACT nasal spray Place 2 sprays into both nostrils daily. 16 g 0   ibuprofen (ADVIL) 200 MG tablet Take 400-600 mg by mouth daily as needed for moderate pain.     metoprolol succinate (TOPROL-XL) 50 MG 24 hr tablet TAKE 1 TABLET BY MOUTH EVERY DAY WITH OR IMMEDIATELY FOLLOWING A MEAL 90 tablet 1   omeprazole (PRILOSEC) 40 MG capsule Take 1 capsule (40 mg total) by mouth daily. Once daily 30 capsule 2   Current Facility-Administered Medications  Medication Dose Route Frequency Provider Last Rate Last Admin   0.9 %  sodium chloride infusion  500 mL Intravenous Once Sharyn Creamer, MD      Review of Systems: All systems reviewed and negative except where noted in HPI.   Physical Exam: BP 100/70 (BP Location: Left Arm, Patient Position: Sitting, Cuff Size: Normal)   Pulse 60   Ht '5\' 1"'$  (1.549 m) Comment: eight measured without shoes  Wt 168 lb 8 oz (76.4 kg)   LMP 11/26/2000   BMI 31.84 kg/m  Constitutional: Pleasant,well-developed, female in no acute distress. HEENT: Normocephalic and atraumatic. Conjunctivae are normal. No scleral icterus. Cardiovascular: Normal rate, regular rhythm.  Pulmonary/chest: Effort normal and breath sounds normal. No wheezing, rales or rhonchi. Abdominal: Soft, nondistended, non-tender. Bowel sounds active throughout. There are no masses palpable.  No hepatomegaly. Extremities: No edema Neurological: Alert and oriented to person place and time. Skin: Skin is warm and dry. No rashes noted. Psychiatric: Normal mood and affect. Behavior is normal.  Labs 06/01/21: CBC unremarkable. CMP nml. TSH nml. Lipase nml.  CT A/P 05/27/21: IMPRESSION: 1. No acute findings within the abdomen or pelvis. No bowel obstruction or evidence of bowel wall inflammation. No evidence of acute solid organ abnormality. No renal or ureteral calculi. 2. Leiomyomatous uterus. 3. Degenerative spondylosis within the lower lumbar spine, at  least moderate in degree at the lumbosacral junction.  RUQ U/S 06/11/21: FINDINGS: Gallbladder: No gallstones or wall thickening visualized. No sonographic Murphy sign noted by sonographer. Tiny focus of adenomyomatosis at the gallbladder fundus. Common bile duct: Diameter: 2 mm Liver: The liver demonstrates normal echogenicity. Two small cysts measure up to 1 cm in the right lobe of the liver. There is a 1.4 x 0.8 x 1.2 cm focus of subcapsular calcification in the right lobe, also seen on the prior CT. Portal vein is patent on color Doppler imaging with normal direction of blood flow towards the liver. Other: None. IMPRESSION: 1. No gallstone or sonographic evidence of acute cholecystitis. 2. Small liver cysts. 3. Small focus of subcapsular calcification in the right lobe of the liver  Colonoscopy 02/12/19: - One 8 mm polyp in the cecum, removed with a hot snare. Resected and retrieved. - Internal hemorrhoids. Path: A. COLON, CECUM POLYP, BIOPSY:  - Tubular adenoma.  - No high-grade dysplasia or carcinoma.   EGD 09/14/21: - Esophageal mucosal variant due to an inlet patch. - White nummular lesions in esophageal mucosa. Biopsied. - Erosive gastropathy with no bleeding and no stigmata of recent bleeding. Biopsied. - Normal examined duodenum. Biopsied. Path: 1. Surgical [P], duodenal biopsies - DUODENAL MUCOSA WITH NORMAL VILLOUS ARCHITECTURE. - NO VILLOUS ATROPHY OR INCREASED INTRAEPITHELIAL LYMPHOCYTES. 2. Surgical [P], gastric biopsies - UNREMARKABLE ANTRAL AND OXYNTIC MUCOSA. - NO HELICOBACTER PYLORI IDENTIFIED. 3. Surgical [P], esophageal biopsies - BENIGN SQUAMOUS MUCOSA. - NO EOSINOPHILIC ESOPHAGITIS. - PAS stain is performed and no fungi are identified.  ASSESSMENT AND PLAN:  Indigestion GERD Constipation Gallbladder adenomyomatosis Patient is overall doing quite well currently.  Her issues with constipation, GERD, and indigestion have resolved on the PPI BID  therapy.  I will attempt to wean her omeprazole to the lowest possible dose that she is able to tolerate.  I will start off with reducing her omeprazole to once daily.  If patient is not able to tolerate the once a day dosing, can increase her omeprazole back to twice daily.  As we have discussed previously, we will plan to follow-up her gallbladder adenomyomatosis in 1 year due to patient concern.  - Decreased omeprazole from 40 mg BID from QD - Repeat RUQ U/S in 05/2022 - Repeat colonoscopy in 01/2026 based upon prior colonoscopy - RTC 6 months  Christia Reading, MD

## 2021-10-26 NOTE — Patient Instructions (Signed)
If you are age 70 or older, your body mass index should be between 23-30. Your Body mass index is 31.84 kg/m. If this is out of the aforementioned range listed, please consider follow up with your Primary Care Provider.  If you are age 54 or younger, your body mass index should be between 19-25. Your Body mass index is 31.84 kg/m. If this is out of the aformentioned range listed, please consider follow up with your Primary Care Provider.   ________________________________________________________  The [facility] providers would like to encourage you to use [facility] to communicate with providers for non-urgent requests or questions.  Due to long hold times on the telephone, sending your provider a message by [facility] may be a faster and more efficient way to get a response.  Please allow 48 business hours for a response.  Please remember that this is for non-urgent requests.  _______________________________________________________  We have sent the following medications to your pharmacy for you to pick up at your convenience: Omeprazole 40 mg 1 capsule daily.   You will be due to follow up in January 2024. We will contact you to schedule the appointment.   Thank you for entrusting me with your care and choosing [facility].  Dr Lorenso Courier

## 2021-10-31 NOTE — Progress Notes (Signed)
[name] D.[place] Phone: [phone]   Assessment and Plan:     There are no diagnoses linked to this encounter.  *** - Patient has received significant relief with OMT in the past.  Elects for repeat OMT today.  Tolerated well per note below. - Decision today to treat with OMT was based on Physical Exam   After verbal consent patient was treated with HVLA (high velocity low amplitude), ME (muscle energy), FPR (flex positional release), ST (soft tissue), PC/PD (Pelvic Compression/ Pelvic Decompression) techniques in cervical, rib, thoracic, lumbar, and pelvic areas. Patient tolerated the procedure well with improvement in symptoms.  Patient educated on potential side effects of soreness and recommended to rest, hydrate, and use Tylenol as needed for pain control.   Pertinent previous records reviewed include ***   Follow Up: ***     Subjective:   I, [name], am serving as a Education administrator for Doctor Glennon Mac   Chief Complaint: OMT follow up    HPI:  03/23/21 Patient with chronic back/neck pain hoping to avoid surgical intervention.   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 She feels like she is doing better. Meloxicam is helping    05/25/2021 Patient states that she's okay will hopefully be getting a CT scan soon , just here for some maintenance     06/25/2021 Patient states that she's feeling pretty good isn't in as much pain as she had been having still stiff at times needs her adjustment    07/23/2021 Patient states  time for a tune up, neck and shoulder are tight giving her a little bit of a headache    08/13/2021 Patient states that she is doing the neck and shoulder are tight still getting a headache   5/16/023 Patient states she is doing pain is intermittent but over all better    10/02/2021 Patient states time for a tune up    11/01/2021 Patient states    Relevant Historical Information: Hashimoto's, DDD lumbar spine, history of SVT  Additional pertinent review of systems negative.  Current Outpatient Medications  Medication Sig Dispense Refill   Cholecalciferol (VITAMIN D-3) 125 MCG (5000 UT) TABS Take 5,000 Units by mouth See admin instructions. Monday and Friday 30 tablet    dicyclomine (BENTYL) 20 MG tablet Take 1 tablet (20 mg total) by mouth 2 (two) times daily as needed for spasms. 30 tablet 1   ezetimibe (ZETIA) 10 MG tablet Take 1 tablet (10 mg total) by mouth at bedtime. 90 tablet 3   fluticasone (FLONASE) 50 MCG/ACT nasal spray Place 2 sprays into both nostrils daily. 16 g 0   ibuprofen (ADVIL) 200 MG tablet Take 400-600 mg by mouth daily as needed for moderate pain.     levothyroxine (SYNTHROID) 75 MCG tablet Take 75 mcg by mouth daily before breakfast.     losartan (COZAAR) 25 MG tablet Take 1 tablet (25 mg total) by mouth daily. 90 tablet 2   metoprolol succinate (TOPROL-XL) 50 MG 24 hr tablet TAKE 1 TABLET BY MOUTH EVERY DAY WITH OR IMMEDIATELY  FOLLOWING A MEAL 90 tablet 1   Multiple Vitamin (MULTIVITAMIN WITH MINERALS) TABS tablet Take 1 tablet by mouth daily.     omeprazole (PRILOSEC) 40 MG capsule Take 1 capsule (40 mg total) by mouth daily. Once daily 30 capsule 2   polyethylene glycol (MIRALAX / GLYCOLAX) packet Take 17 g by mouth daily as needed for mild constipation.      Polyvinyl Alcohol-Povidone PF (REFRESH) 1.4-0.6 % SOLN Apply 1 drop to eye 3 (three) times daily as needed (for dry eyes).     pravastatin (PRAVACHOL) 20 MG tablet Take 1 tablet (20 mg total) by mouth  every evening. 30 tablet 11   Probiotic Product (PROBIOTIC ADVANCED PO) Take 1 capsule by mouth daily.      RESTASIS 0.05 % ophthalmic emulsion Place 1 drop into both eyes 2 (two) times daily.     White Petrolatum-Mineral Oil (SYSTANE NIGHTTIME) OINT SMARTSIG:1 Inch(es) In Eye(s) Every Night     Current Facility-Administered Medications  Medication Dose Route Frequency Provider Last Rate Last Admin   0.9 %  sodium chloride infusion  500 mL Intravenous Once Sharyn Creamer, MD          Objective:     There were no vitals filed for this visit.    There is no height or weight on file to calculate BMI.    Physical Exam:     General: Well-appearing, cooperative, sitting comfortably in no acute distress.   OMT Physical Exam:  ASIS Compression Test: Positive Right Cervical: TTP paraspinal, *** Rib: Bilateral elevated first rib with TTP Thoracic: TTP paraspinal,*** Lumbar: TTP paraspinal,*** Pelvis: Right anterior innominate  Electronically signed by:  [name] D.[facility] 2:24 PM 10/31/21

## 2021-11-01 ENCOUNTER — Ambulatory Visit: Payer: Medicare HMO | Admitting: Sports Medicine

## 2021-11-01 VITALS — BP 136/80 | HR 66 | Ht 61.0 in | Wt 168.0 lb

## 2021-11-01 DIAGNOSIS — M9905 Segmental and somatic dysfunction of pelvic region: Secondary | ICD-10-CM

## 2021-11-01 DIAGNOSIS — G8929 Other chronic pain: Secondary | ICD-10-CM | POA: Diagnosis not present

## 2021-11-01 DIAGNOSIS — M9902 Segmental and somatic dysfunction of thoracic region: Secondary | ICD-10-CM | POA: Diagnosis not present

## 2021-11-01 DIAGNOSIS — M542 Cervicalgia: Secondary | ICD-10-CM

## 2021-11-01 DIAGNOSIS — M9908 Segmental and somatic dysfunction of rib cage: Secondary | ICD-10-CM

## 2021-11-01 DIAGNOSIS — M9901 Segmental and somatic dysfunction of cervical region: Secondary | ICD-10-CM

## 2021-11-01 DIAGNOSIS — M545 Low back pain, unspecified: Secondary | ICD-10-CM | POA: Diagnosis not present

## 2021-11-01 DIAGNOSIS — M9903 Segmental and somatic dysfunction of lumbar region: Secondary | ICD-10-CM

## 2021-11-01 NOTE — Patient Instructions (Signed)
Good to see you   

## 2021-11-10 ENCOUNTER — Other Ambulatory Visit: Payer: Self-pay

## 2021-11-10 ENCOUNTER — Ambulatory Visit
Admission: RE | Admit: 2021-11-10 | Discharge: 2021-11-10 | Disposition: A | Discharge status: Home / Self Care | Payer: Medicare HMO | Source: Ambulatory Visit | Attending: Family Medicine | Admitting: Family Medicine

## 2021-11-10 VITALS — BP 160/87 | HR 65 | Temp 98.7°F | Resp 18

## 2021-11-10 DIAGNOSIS — J069 Acute upper respiratory infection, unspecified: Secondary | ICD-10-CM | POA: Diagnosis not present

## 2021-11-10 MED ORDER — IPRATROPIUM BROMIDE 0.03 % NA SOLN: 2.0000 | Freq: Three times a day (TID) | NASAL | 0 refills | Status: DC | PRN

## 2021-11-10 NOTE — ED Provider Notes (Signed)
Roderic Palau    CSN: [id number] Arrival date & time: 11/10/21  1322      History   Chief Complaint Chief Complaint  Patient presents with   Nasal Congestion   APPT 1330    HPI [name] is a 71 y.o. female.   HPI   [name] is a 71 y.o. female who complains of congestion, dry cough, sinus pressure (frontal and Maxillary), and tickle in th throat x 2 days.  She has no history of asthma or her allergies.  No known sick contacts.  She is afebrile.  Denies any worrisome symptoms of shortness of breath or wheezing. Past Medical History:  Diagnosis Date   Arthritis    Neck   CIN I (cervical intraepithelial neoplasia I) 1937   Complication of anesthesia    delayed SVT:  can occur from a few hours to 3 days after the general anesthesia or conscious sedation as happened with her colonoscopy.    Dysplasia of cervix, low grade (CIN 1)    LEEP cervical conization September 2017 margins negative CIN-1   GERD (gastroesophageal reflux disease)    Hashimoto's thyroiditis    History of carpal tunnel syndrome    Left   History of colon polyps 2010   Benign   Hyperlipidemia    Hypothyroidism    Hashimoto's per pt    Osteopenia 10/2017   T score -1.9 FRAX 8.8% / 0.7%.  Stable from prior DEXA.   SVT (supraventricular tachycardia) (HCC)    Wears glasses     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   Abnormal CT of the abdomen 02/25/2018   Abdominal pain, right upper quadrant 02/25/2018   Abnormal finding on radiology exam 02/25/2018   Allergic rhinitis 02/25/2018   Brash 02/25/2018   CN (constipation) 02/25/2018   LBP (low back pain) 02/25/2018   Combined form of age-related cataract, both eyes 04/07/2017   Posterior vitreous  detachment of right eye 04/07/2017   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   CIN I (cervical intraepithelial neoplasia I) 01/02/2016   Preoperative cardiovascular examination 11/10/2015   ASCUS with positive high risk HPV cervical 12/13/2014   Eyelid retraction 12/02/2014   Dry eye syndrome of bilateral lacrimal glands 12/01/2014   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   Other disorders affecting eyelid function 12/01/2014   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   Peripheral visual field defect of both eyes 12/01/2014   Age-related nuclear cataract of both eyes 09/06/2014   Anatomical narrow angle of both eyes 09/06/2014   Hx of Hashimoto thyroiditis 09/06/2014   Keratoconjunctivitis sicca of both eyes not specified as Sjogren's 09/06/2014   Meibomian gland dysfunction (MGD) of upper and lower lids of both eyes 09/06/2014   Abnormal Papanicolaou smear of cervix with positive human papilloma virus (HPV) test 06/27/2014   Hypokalemia 03/04/2014   History of paroxysmal supraventricular tachycardia 02/25/2014   Osteopenia 04/30/2013   Dysphonia 04/05/2013   Laryngopharyngeal reflux 04/05/2013   Hashimoto's thyroiditis 03/29/2013   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   Hypercalcemia 05/25/2012   Hypothyroidism 04/27/2012   Hyperlipidemia 04/27/2012   Paroxysmal supraventricular tachycardia ([place]) 04/27/2012   Tachycardia 01/22/2012   Endometrial polyp 12/04/2010   Fibroid, uterus 11/27/2010   Acquired hypothyroidism 01/17/2010   Other  and unspecified hyperlipidemia 02/01/2008   Other specified cardiac dysrhythmias(427.89) 12/07/2007    Past Surgical History:  Procedure Laterality Date   BREAST SURGERY  1976   CYST REMOVED   CARDIAC ELECTROPHYSIOLOGY STUDY AND ABLATION  2001   CESAREAN SECTION  1987   COLONOSCOPY     COLONOSCOPY WITH PROPOFOL N/A 02/12/2019   Procedure: COLONOSCOPY WITH PROPOFOL;  Surgeon: Wonda Horner, MD;  Location: [facility]  ENDOSCOPY;  Service: Endoscopy;  Laterality: N/A;   LEEP  2017   left thumb surgery and Carpal Tunnel Release  9/14   lower lid surgery   09/2018   dry eye from Hashimoto's   POLYPECTOMY  02/12/2019   Procedure: POLYPECTOMY;  Surgeon: Wonda Horner, MD;  Location: [facility] ENDOSCOPY;  Service: Endoscopy;;    OB History     Gravida  1   Para  1   Term  1   Preterm      AB      Living  1      SAB      IAB      Ectopic      Multiple      Live Births               Home Medications    Prior to Admission medications   Medication Sig Start Date End Date Taking? Authorizing Provider  ezetimibe (ZETIA) 10 MG tablet Take 1 tablet (10 mg total) by mouth at bedtime. 08/28/21  Yes Freada Bergeron, MD  ipratropium (ATROVENT) 0.03 % nasal spray Place 2 sprays into both nostrils 3 (three) times daily as needed for rhinitis. 11/10/21  Yes Scot Jun, FNP  levothyroxine (SYNTHROID) 75 MCG tablet Take 75 mcg by mouth daily before breakfast.   Yes [provider]  losartan (COZAAR) 25 MG tablet Take 1 tablet (25 mg total) by mouth daily. 06/07/21  Yes Freada Bergeron, MD  metoprolol succinate (TOPROL-XL) 50 MG 24 hr tablet TAKE 1 TABLET BY MOUTH EVERY DAY WITH OR IMMEDIATELY FOLLOWING A MEAL 10/26/21  Yes Camnitz, Will Hassell Done, MD  Multiple Vitamin (MULTIVITAMIN WITH MINERALS) TABS tablet Take 1 tablet by mouth daily.   Yes [provider]  omeprazole (PRILOSEC) 40 MG capsule Take 1 capsule (40 mg total) by mouth daily. Once daily 10/26/21  Yes Sharyn Creamer, MD  Polyvinyl Alcohol-Povidone PF (REFRESH) 1.4-0.6 % SOLN Apply 1 drop to eye 3 (three) times daily as needed (for dry eyes).   Yes [provider]  pravastatin (PRAVACHOL) 20 MG tablet Take 1 tablet (20 mg total) by mouth every evening. 12/05/20  Yes Freada Bergeron, MD  Probiotic Product (PROBIOTIC ADVANCED PO) Take 1 capsule by mouth daily.    Yes [provider]  RESTASIS 0.05  % ophthalmic emulsion Place 1 drop into both eyes 2 (two) times daily. 05/21/21  Yes [provider]  Cholecalciferol (VITAMIN D-3) 125 MCG (5000 UT) TABS Take 5,000 Units by mouth See admin instructions. Monday and Friday 06/01/21   Antonieta Pert, MD  dicyclomine (BENTYL) 20 MG tablet Take 1 tablet (20 mg total) by mouth 2 (two) times daily as needed for spasms. 08/23/21   Sharyn Creamer, MD  fluticasone (FLONASE) 50 MCG/ACT nasal spray Place 2 sprays into both nostrils daily. 11/19/20   Scot Jun, FNP  ibuprofen (ADVIL) 200 MG tablet Take 400-600 mg by mouth daily as needed for moderate pain.    [provider]  polyethylene glycol (MIRALAX /  GLYCOLAX) packet Take 17 g by mouth daily as needed for mild constipation.     [provider]  White Petrolatum-Mineral Oil (SYSTANE NIGHTTIME) OINT SMARTSIG:1 Inch(es) In Eye(s) Every Night 10/02/21   [provider]    Family History Family History  Problem Relation Age of Onset   Hypertension Mother    Heart disease Mother    Mitral valve prolapse Mother    Glaucoma Mother    Arthritis Mother    Von Willebrand disease Mother    Stroke Mother    Rheum arthritis Mother    Neuropathy Mother    Hypertension Father    Heart disease Father    Colon polyps Father    Glaucoma Father    Arthritis Father    Stroke Father    Rheum arthritis Father    Pulmonary embolism Father    Arthritis Sister    Stroke Brother        TIA   Hypertension Brother    Mitral valve prolapse Brother    Glaucoma Brother    Arthritis Brother     Social History Social History   Tobacco Use   Smoking status: Never   Smokeless tobacco: Never  Vaping Use   Vaping Use: Never used  Substance Use Topics   Alcohol use: Not Currently   Drug use: No     Allergies   Diphenhydramine, Epinephrine, Lipitor [atorvastatin], Praluent [alirocumab], and Repatha [evolocumab]   Review of Systems Review of Systems Pertinent negatives  listed in HPI   Physical Exam Triage Vital Signs ED Triage Vitals  Enc Vitals Group     BP 11/10/21 1335 (!) 160/87     Pulse Rate 11/10/21 1335 65     Resp 11/10/21 1335 18     Temp 11/10/21 1335 98.7 F (37.1 C)     Temp Source 11/10/21 1335 Oral     SpO2 11/10/21 1335 95 %     Weight --      Height --      Head Circumference --      Peak Flow --      Pain Score 11/10/21 1338 5     Pain Loc --      Pain Edu? --      Excl. in [place]? --    No data found.  Updated Vital Signs BP (!) 160/87 (BP Location: Right Arm)   Pulse 65   Temp 98.7 F (37.1 C) (Oral)   Resp 18   LMP 11/26/2000   SpO2 95%   Visual Acuity Right Eye Distance:   Left Eye Distance:   Bilateral Distance:    Right Eye Near:   Left Eye Near:    Bilateral Near:     Physical Exam  General Appearance:    Alert, cooperative, no distress  HENT:   Normocephalic, ears normal, nares mucosal edema with congestion present,  negative rhinorrhea, oropharynx  w/o erythema or exudate   Eyes:    PERRL, conjunctiva/corneas clear, EOM's intact       Lungs:     Clear to auscultation bilaterally, respirations unlabored  Heart:    Regular rate and rhythm  Neurologic:   Awake, alert, oriented x 3. No apparent focal neurological           defect.      UC Treatments / Results  Labs (all labs ordered are listed, but only abnormal results are displayed) Labs Reviewed - No data to display  EKG   Radiology No  results found.  Procedures Procedures (including critical care time)  Medications Ordered in UC Medications - No data to display  Initial Impression / Assessment and Plan / UC Course  I have reviewed the triage vital signs and the nursing notes.  Pertinent labs & imaging results that were available during my care of the patient were reviewed by me and considered in my medical decision making (see chart for details).    Viral URI with cough Recommended over-the-counter Mucinex plain 1200 mg twice daily  for cough and congestion.  Atrovent nasal spray to help manage nasal symptoms after 3 times daily as needed.  Hydrate well with fluids advised to limit exposure to excessive heat as this can worsen symptoms.  Return if symptoms persist beyond 7-10 days. Final Clinical Impressions(s) / UC Diagnoses   Final diagnoses:  Viral URI with cough     Discharge Instructions      Take plain Mucinex (request from pharmacist) Take 1200 mg (take two tablets) every 12 hours for cough and congestion. .  Use Atrovent nasal spray as needed for nasal symptoms may use up to 3 times daily as needed.  Follow your from 7 to 10 days for symptoms to completely resolve. Try to avoid excessive heat as this can worsen symptoms.  Drink plenty of water.     ED Prescriptions     Medication Sig Dispense Auth. Provider   ipratropium (ATROVENT) 0.03 % nasal spray Place 2 sprays into both nostrils 3 (three) times daily as needed for rhinitis. 30 mL Scot Jun, FNP      PDMP not reviewed this encounter.   Scot Jun, FNP 11/10/21 [phone]

## 2021-11-10 NOTE — ED Triage Notes (Signed)
Patient c/o sinus pressure x 2 days.   Patient denies any fever.   Patient endorses nonproductive cough. Patient endorses bilateral ear fullness.   Patient endorses "throat tickling".   Patient has used nasal wash and humidifier with no relief of symptoms.

## 2021-11-10 NOTE — Discharge Instructions (Addendum)
Take plain Mucinex (request from pharmacist) Take 1200 mg (take two tablets) every 12 hours for cough and congestion. .  Use Atrovent nasal spray as needed for nasal symptoms may use up to 3 times daily as needed.  Follow your from 7 to 10 days for symptoms to completely resolve. Try to avoid excessive heat as this can worsen symptoms.  Drink plenty of water.

## 2021-11-12 ENCOUNTER — Other Ambulatory Visit: Payer: Self-pay

## 2021-11-12 MED ORDER — PRAVASTATIN SODIUM 20 MG PO TABS: 20.0000 mg | ORAL_TABLET | Freq: Every evening | ORAL | 11 refills | Status: DC

## 2021-11-26 NOTE — Progress Notes (Signed)
[name] D.[place] Phone: [phone]   Assessment and Plan:     1. Chronic bilateral low back pain without sciatica 2. Somatic dysfunction of cervical region 3. Somatic dysfunction of thoracic region 4. Somatic dysfunction of lumbar region 5. Somatic dysfunction of pelvic region 6. Somatic dysfunction of sacral region  - Chronic with exacerbation, subsequent visit -Recurrent multiple musculoskeletal complaints with most prominent being in low back and bilateral hips - Patient has received significant relief with OMT in the past.  Elects for repeat OMT today.  Tolerated well per note below. - Decision today to treat with OMT was based on Physical Exam   After verbal consent patient was treated with HVLA (high velocity low amplitude), ME (muscle energy), FPR (flex positional release), ST (soft tissue), PC/PD (Pelvic Compression/ Pelvic Decompression) techniques in cervical, sacral, thoracic, lumbar, and pelvic areas. Patient tolerated the procedure well with improvement in symptoms.  Patient educated on potential side effects of soreness and recommended to rest, hydrate, and use Tylenol as needed for pain control.   Pertinent previous records reviewed include none   Follow Up: 4 weeks for reevaluation and repeat OMT if necessary   Subjective:   I, [name], am serving as a Education administrator for Doctor Glennon Mac   Chief Complaint: OMT follow up    HPI:  03/23/21 Patient with chronic back/neck pain hoping to avoid surgical intervention.   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 She feels like she is doing better. Meloxicam is helping    05/25/2021 Patient states that she's okay will hopefully be getting a CT scan soon , just here for some maintenance     06/25/2021 Patient states that she's feeling pretty good isn't in as much pain as she had been having still stiff at times needs her adjustment    07/23/2021 Patient states time for a tune up, neck and shoulder are tight giving her a little bit of a headache    08/13/2021 Patient states that she is doing the neck and shoulder are tight still getting a headache   5/16/023 Patient states she is doing pain is intermittent but over all better    10/02/2021 Patient states time for a tune up    11/01/2021 Patient states that she is a little tight this week    12/04/2021 Patient states the pain isnt as intense , has been in [place]    Relevant Historical Information: Hashimoto's, DDD lumbar spine, history of SVT  Additional pertinent review of systems negative.  Current Outpatient Medications  Medication Sig Dispense Refill   Cholecalciferol (VITAMIN D-3) 125 MCG (5000 UT) TABS Take 5,000 Units by mouth See admin instructions. Monday and Friday 30 tablet    ezetimibe (ZETIA) 10 MG tablet Take 1 tablet (10 mg total) by mouth at bedtime. 90 tablet 3   ipratropium (ATROVENT) 0.03 % nasal spray Place 2 sprays into both nostrils 3 (three) times daily as needed for rhinitis. 30 mL 0   levothyroxine (SYNTHROID) 75 MCG tablet Take 75 mcg by mouth daily before breakfast.  losartan (COZAAR) 25 MG tablet Take 1 tablet (25 mg total) by mouth daily. 90 tablet 2   metoprolol succinate (TOPROL-XL) 50 MG 24 hr tablet TAKE 1 TABLET BY MOUTH EVERY DAY WITH OR IMMEDIATELY FOLLOWING A MEAL 90 tablet 1   Multiple Vitamin (MULTIVITAMIN WITH MINERALS) TABS tablet Take 1 tablet by mouth daily.     omeprazole (PRILOSEC) 40 MG capsule Take 1 capsule (40 mg total) by mouth daily. Once  daily 30 capsule 2   polyethylene glycol (MIRALAX / GLYCOLAX) packet Take 17 g by mouth daily as needed for mild constipation.      Polyvinyl Alcohol-Povidone PF (REFRESH) 1.4-0.6 % SOLN Apply 1 drop to eye 3 (three) times daily as needed (for dry eyes).     pravastatin (PRAVACHOL) 20 MG tablet Take 1 tablet (20 mg total) by mouth every evening. 30 tablet 11   Probiotic Product (PROBIOTIC ADVANCED PO) Take 1 capsule by mouth daily.      RESTASIS 0.05 % ophthalmic emulsion Place 1 drop into both eyes 2 (two) times daily.     White Petrolatum-Mineral Oil (SYSTANE NIGHTTIME) OINT SMARTSIG:1 Inch(es) In Eye(s) Every Night     dicyclomine (BENTYL) 20 MG tablet Take 1 tablet (20 mg total) by mouth 2 (two) times daily as needed for spasms. 30 tablet 1   fluticasone (FLONASE) 50 MCG/ACT nasal spray Place 2 sprays into both nostrils daily. 16 g 0   ibuprofen (ADVIL) 200 MG tablet Take 400-600 mg by mouth daily as needed for moderate pain.     Current Facility-Administered Medications  Medication Dose Route Frequency Provider Last Rate Last Admin   0.9 %  sodium chloride infusion  500 mL Intravenous Once Sharyn Creamer, MD          Objective:     Vitals:   12/04/21 1300  BP: 132/82  Pulse: 77  SpO2: 97%  Weight: 168 lb (76.2 kg)  Height: '5\' 1"'$  (1.549 m)      Body mass index is 31.74 kg/m.    Physical Exam:     General: Well-appearing, cooperative, sitting comfortably in no acute distress.   OMT Physical Exam:  ASIS Compression Test: Positive Right Cervical: TTP paraspinal, C3 RRSL Sacrum: Positive sphinx, TTP right sacral base Thoracic: TTP paraspinal, T4-7 RRSL Lumbar: TTP paraspinal, L1-3 RRSL Pelvis: Right anterior innominate  Electronically signed by:  [name] D.[facility] 1:40 PM 12/04/21

## 2021-11-28 ENCOUNTER — Inpatient Hospital Stay: Admit: 2021-11-28 | Discharge: 2021-11-28 | Payer: PRIVATE HEALTH INSURANCE

## 2021-11-28 ENCOUNTER — Encounter: Admit: 2021-11-28 | Payer: PRIVATE HEALTH INSURANCE | Primary: Family Medicine

## 2021-11-28 ENCOUNTER — Ambulatory Visit: Admit: 2021-11-28 | Payer: Medicare (Managed Care) | Attending: Family | Primary: Internal Medicine

## 2021-11-28 ENCOUNTER — Emergency Department: Admit: 2021-11-28 | Payer: PRIVATE HEALTH INSURANCE | Primary: Family Medicine

## 2021-11-28 DIAGNOSIS — R1013 Epigastric pain: Secondary | ICD-10-CM

## 2021-11-28 DIAGNOSIS — E785 Hyperlipidemia, unspecified: Secondary | ICD-10-CM

## 2021-11-28 DIAGNOSIS — Z888 Allergy status to other drugs, medicaments and biological substances status: Secondary | ICD-10-CM

## 2021-11-28 DIAGNOSIS — Z79890 Hormone replacement therapy (postmenopausal): Secondary | ICD-10-CM

## 2021-11-28 DIAGNOSIS — E079 Disorder of thyroid, unspecified: Secondary | ICD-10-CM

## 2021-11-28 DIAGNOSIS — R002 Palpitations: Secondary | ICD-10-CM

## 2021-11-28 DIAGNOSIS — E063 Autoimmune thyroiditis: Secondary | ICD-10-CM

## 2021-11-28 DIAGNOSIS — Z79899 Other long term (current) drug therapy: Secondary | ICD-10-CM

## 2021-11-28 DIAGNOSIS — R110 Nausea: Secondary | ICD-10-CM

## 2021-11-28 DIAGNOSIS — R140 Abdominal distension (gaseous): Secondary | ICD-10-CM

## 2021-11-28 LAB — BASIC METABOLIC PANEL
BKR ANION GAP: 12 (ref 7–17)
BKR BLOOD UREA NITROGEN: 12 mg/dL (ref 8–23)
BKR BUN / CREAT RATIO: 14.1 (ref 8.0–23.0)
BKR CALCIUM: 10.4 mg/dL — ABNORMAL HIGH (ref 8.8–10.2)
BKR CHLORIDE: 106 mmol/L (ref 98–107)
BKR CO2: 23 mmol/L (ref 20–30)
BKR CREATININE: 0.85 mg/dL (ref 0.40–1.30)
BKR EGFR, CREATININE (CKD-EPI 2021): >60 mL/min/{1.73_m2} (ref >=60)
BKR GLUCOSE: 95 mg/dL (ref 70–100)
BKR POTASSIUM: 3.6 mmol/L (ref 3.3–5.3)
BKR SODIUM: 141 mmol/L (ref 136–144)

## 2021-11-28 LAB — CBC WITH AUTO DIFFERENTIAL
BKR WAM ABSOLUTE IMMATURE GRANULOCYTES.: 0.01 x 1000/ÂµL (ref 0.00–0.30)
BKR WAM ABSOLUTE LYMPHOCYTE COUNT.: 1.8 x 1000/ÂµL (ref 0.60–3.70)
BKR WAM ABSOLUTE NRBC (2 DEC): 0 x 1000/ÂµL (ref 0.00–1.00)
BKR WAM ANALYZER ANC: 2.9 x 1000/ÂµL (ref 2.00–7.60)
BKR WAM BASOPHIL ABSOLUTE COUNT.: 0 x 1000/ÂµL (ref 0.00–1.00)
BKR WAM BASOPHILS: 0 % (ref 0.0–1.4)
BKR WAM EOSINOPHIL ABSOLUTE COUNT.: 0.03 x 1000/ÂµL (ref 0.00–1.00)
BKR WAM EOSINOPHILS: 0.6 % (ref 0.0–5.0)
BKR WAM HEMATOCRIT (2 DEC): 37.5 % (ref 35.00–45.00)
BKR WAM HEMOGLOBIN: 13.7 g/dL (ref 11.7–15.5)
BKR WAM IMMATURE GRANULOCYTES: 0.2 % (ref 0.0–1.0)
BKR WAM LYMPHOCYTES: 35.2 % (ref 17.0–50.0)
BKR WAM MCH (PG): 29.6 pg (ref 27.0–33.0)
BKR WAM MCHC: 36.5 g/dL — ABNORMAL HIGH (ref 31.0–36.0)
BKR WAM MCV: 81 fL (ref 80.0–100.0)
BKR WAM MONOCYTE ABSOLUTE COUNT.: 0.37 x 1000/ÂµL (ref 0.00–1.00)
BKR WAM MONOCYTES: 7.2 % (ref 4.0–12.0)
BKR WAM MPV: 10.5 fL (ref 8.0–12.0)
BKR WAM NEUTROPHILS: 56.8 % (ref 39.0–72.0)
BKR WAM NUCLEATED RED BLOOD CELLS: 0 % (ref 0.0–1.0)
BKR WAM PLATELETS: 149 x1000/ÂµL — ABNORMAL LOW (ref 150–420)
BKR WAM RDW-CV: 14.6 % (ref 11.0–15.0)
BKR WAM RED BLOOD CELL COUNT.: 4.63 M/ÂµL (ref 4.00–6.00)
BKR WAM WHITE BLOOD CELL COUNT: 5.1 x1000/ÂµL (ref 4.0–11.0)

## 2021-11-28 LAB — HEPATIC FUNCTION PANEL
BKR A/G RATIO: 1.9 (ref 1.0–2.2)
BKR ALANINE AMINOTRANSFERASE (ALT): 21 U/L (ref 10–35)
BKR ALBUMIN: 4.7 g/dL (ref 3.6–4.9)
BKR ALKALINE PHOSPHATASE: 91 U/L (ref 9–122)
BKR ASPARTATE AMINOTRANSFERASE (AST): 21 U/L (ref 10–35)
BKR AST/ALT RATIO: 1
BKR BILIRUBIN DIRECT: <0.2 mg/dL (ref <=0.3)
BKR BILIRUBIN TOTAL: 0.5 mg/dL (ref <=1.2)
BKR GLOBULIN: 2.5 g/dL (ref 2.3–3.5)
BKR PROTEIN TOTAL: 7.2 g/dL (ref 6.6–8.7)

## 2021-11-28 LAB — LIPASE: BKR LIPASE: 24 U/L (ref 11–55)

## 2021-11-28 LAB — MAGNESIUM: BKR MAGNESIUM: 1.8 mg/dL (ref 1.7–2.4)

## 2021-11-28 MED ORDER — PRAVASTATIN 20 MG TABLET
20 | ORAL | 3.00 refills | 90.00000 days | Status: AC
Start: 2021-11-28 — End: 2023-02-26

## 2021-11-28 MED ORDER — SODIUM CHLORIDE 0.9 % BOLUS (NEW BAG)
0.9 % | INTRAVENOUS | Status: CP
Start: 2021-11-28 — End: ?
  Administered 2021-11-28: 21:00:00 0.9 mL/h via INTRAVENOUS

## 2021-11-28 MED ORDER — EZETIMIBE 10 MG TABLET
10 | Freq: Every evening | ORAL | 4.00 refills | 90.00000 days | Status: AC
Start: 2021-11-28 — End: 2023-02-26

## 2021-11-28 MED ORDER — LOSARTAN 25 MG TABLET
25 | ORAL | 3.00 refills | 90.00000 days | Status: AC
Start: 2021-11-28 — End: ?

## 2021-11-28 MED ORDER — METOPROLOL SUCCINATE ER 25 MG TABLET,EXTENDED RELEASE 24 HR
25 | ORAL | 4.00 refills | 90.00000 days | Status: AC
Start: 2021-11-28 — End: 2023-02-26

## 2021-11-28 MED ORDER — OMEPRAZOLE 40 MG CAPSULE,DELAYED RELEASE
40 | ORAL | 2.00 refills | 90.00000 days | Status: AC
Start: 2021-11-28 — End: 2023-02-26

## 2021-11-28 MED ORDER — ONDANSETRON HCL (PF) 4 MG/2 ML INJECTION SOLUTION
4 mg/2 mL | Freq: Once | INTRAVENOUS | Status: CP
Start: 2021-11-28 — End: ?
  Administered 2021-11-28: 21:00:00 4 mL via INTRAVENOUS

## 2021-11-28 MED ORDER — MELOXICAM 7.5 MG TABLET
7.5 | ORAL | 1.00 refills | 30.00000 days | Status: AC
Start: 2021-11-28 — End: 2023-02-26

## 2021-11-28 NOTE — ED Notes
4:52 PM Pt presents from  home with reported nausea x 24hours, worsening over the morning. Pt denies vomitting denies cp/sob, denies dysuria, denies dietary changes. IV placed, labs obtained and sent. Awaiting ultrasound and dispo.Past Medical History: Diagnosis Date  Disease of thyroid gland   Hashimoto's  Hashimoto's disease   Hyperlipidemia  5:21 PMPt in imaging at this time, will re-eval up on return.6:08 PMReturn from imaging, results pending.7:25 PMPt ready for dc, dc instructins provided with fu information. Pt verbalized understanding and agreeable to plan.

## 2021-11-28 NOTE — Discharge Instructions
Increase liquids.Bland diet- advance diet as tolerated.Your TSH is pending at time of discharge, but the results should show on MyChart within the next 24 hours.

## 2021-11-29 LAB — TSH: BKR THYROID STIMULATING HORMONE: 0.733 u[IU]/mL

## 2021-11-29 NOTE — ED Provider Notes
Chief Complaint Patient presents with ? Nausea   Presents for eval nausea with dry mouth, abd bloating with a 15 second episode of palpitations today. Reports hx of Hashimotos disease, also, low potassium/magnesium level and svt.  MDM Assessment & Plan HPI: 71yo female with PMHx of hashimoto's Dz and HLD presents with nausea, dry mouth and epigastric pains.   +abd bloating.   No F/C or N/V.   No CP or dyspnea. Concern for cholecystitis, Plan for RUQ [place] and labs where cholecystitis is a potential life threatening problem. Plan:LabsLFTsUATSHRUQ USMedicate and ReevalauteDDx:[id number] Pt signed out to GF, APRNMeds:Medications ondansetron (PF) (ZOFRAN) injection 4 mg (4 mg IV Push Given 11/28/21 1700) sodium chloride 0.9 % (new bag) bolus 1,000 mL (0 mLs Intravenous Stopped 11/28/21 1715) 1850-Received sign out from [employer].  Briefly, pt visiting from [place] here with nausea and abd bloating.  She reports feeling better after IVFs and medication.  Pt informed of [place] results.  Pt aware of labs results and that TSH is pending at this time.  She reports that she has myChart and can follow TSH there.  She feels well enough to go home and will call PCP tomorrow to schedule a f/u for 8/14 when she is back home.  Return precautions reviewed.  Pt verbalizes understanding of dc instructions. Leota Jacobsen APRN Medication List  You have not been prescribed any medications. Discussed with Dr. Harrie Foreman. Imogene Burn, PA-CPast Medical History: Diagnosis Date ? Disease of thyroid gland   Hashimoto's ? Hashimoto's disease  ? Hyperlipidemia  Past Surgical History: Procedure Laterality Date ? BREAST SURGERY    cysts ? CARDIAC ELECTROPHYSIOLOGY MAPPING AND ABLATION   ? CARPAL TUNNEL RELEASE Right 09 2014 ? CESAREAN SECTION    Physical ExamED Triage Vitals [11/28/21 1458]BP: 127/85Pulse: 85Pulse from  O2 sat: n/aResp: 18Temp: 99 ?F (37.2 ?C)Temp src: TemporalSpO2: 99 % BP (!) 145/88  - Pulse 63  - Temp 98.1 ?F (36.7 ?C) (Temporal)  - Resp 16  - Ht 5' 2 (1.575 m)  - Wt 76.2 kg  - SpO2 98%  - BMI 30.73 kg/m? Physical Exam ProceduresAttestation/Critical CareClinical Impressions as of 11/28/21 2102 Nausea Abdominal bloating  ED DispositionDischarge Arnoldo Lenis, PA08/09/23 1723 Leota Jacobsen, APRN08/09/23 2102

## 2021-11-30 ENCOUNTER — Other Ambulatory Visit: Payer: Self-pay | Admitting: Family Medicine

## 2021-12-04 ENCOUNTER — Ambulatory Visit: Payer: Medicare HMO | Admitting: Sports Medicine

## 2021-12-04 VITALS — BP 132/82 | HR 77 | Ht 61.0 in | Wt 168.0 lb

## 2021-12-04 DIAGNOSIS — M9904 Segmental and somatic dysfunction of sacral region: Secondary | ICD-10-CM

## 2021-12-04 DIAGNOSIS — M9901 Segmental and somatic dysfunction of cervical region: Secondary | ICD-10-CM | POA: Diagnosis not present

## 2021-12-04 DIAGNOSIS — M9905 Segmental and somatic dysfunction of pelvic region: Secondary | ICD-10-CM

## 2021-12-04 DIAGNOSIS — M9903 Segmental and somatic dysfunction of lumbar region: Secondary | ICD-10-CM

## 2021-12-04 DIAGNOSIS — M9902 Segmental and somatic dysfunction of thoracic region: Secondary | ICD-10-CM

## 2021-12-04 DIAGNOSIS — M545 Low back pain, unspecified: Secondary | ICD-10-CM | POA: Diagnosis not present

## 2021-12-04 DIAGNOSIS — G8929 Other chronic pain: Secondary | ICD-10-CM | POA: Diagnosis not present

## 2021-12-04 NOTE — Patient Instructions (Signed)
Good to see you   

## 2021-12-19 ENCOUNTER — Encounter: Payer: Self-pay | Admitting: Nurse Practitioner

## 2021-12-25 ENCOUNTER — Telehealth: Payer: Self-pay | Admitting: Internal Medicine

## 2021-12-25 NOTE — Telephone Encounter (Signed)
Pt called and states she has a red color to her stool. Requesting a call back to further advice.

## 2021-12-26 ENCOUNTER — Other Ambulatory Visit (INDEPENDENT_AMBULATORY_CARE_PROVIDER_SITE_OTHER): Payer: Medicare HMO

## 2021-12-26 ENCOUNTER — Other Ambulatory Visit: Payer: Self-pay

## 2021-12-26 DIAGNOSIS — R195 Other fecal abnormalities: Secondary | ICD-10-CM

## 2021-12-26 LAB — CBC WITH DIFFERENTIAL/PLATELET
Basophils Absolute: 0 10*3/uL (ref 0.0–0.1)
Basophils Relative: 0.3 % (ref 0.0–3.0)
Eosinophils Absolute: 0.1 10*3/uL (ref 0.0–0.7)
Eosinophils Relative: 1.1 % (ref 0.0–5.0)
HCT: 39 % (ref 36.0–46.0)
Hemoglobin: 13.4 g/dL (ref 12.0–15.0)
Lymphocytes Relative: 46.6 % — ABNORMAL HIGH (ref 12.0–46.0)
Lymphs Abs: 2.5 10*3/uL (ref 0.7–4.0)
MCHC: 34.3 g/dL (ref 30.0–36.0)
MCV: 85.1 fl (ref 78.0–100.0)
Monocytes Absolute: 0.4 10*3/uL (ref 0.1–1.0)
Monocytes Relative: 7.2 % (ref 3.0–12.0)
Neutro Abs: 2.4 10*3/uL (ref 1.4–7.7)
Neutrophils Relative %: 44.8 % (ref 43.0–77.0)
Platelets: 153 10*3/uL (ref 150.0–400.0)
RBC: 4.59 Mil/uL (ref 3.87–5.11)
RDW: 15.2 % (ref 11.5–15.5)
WBC: 5.3 10*3/uL (ref 4.0–10.5)

## 2021-12-26 LAB — IBC + FERRITIN
Ferritin: 77.1 ng/mL (ref 10.0–291.0)
Iron: 101 ug/dL (ref 42–145)
Saturation Ratios: 32.2 % (ref 20.0–50.0)
TIBC: 313.6 ug/dL (ref 250.0–450.0)
Transferrin: 224 mg/dL (ref 212.0–360.0)

## 2021-12-26 NOTE — Telephone Encounter (Signed)
Inbound call from patient requesting a call back. Patient states she has blood in her stool. Advised patient of the next available ov with a provider or PA. Patient states she cannot wait that long. Please give a call to further advise.  Thank you

## 2021-12-26 NOTE — Telephone Encounter (Signed)
Spoke with the patient. She states she has a red color to her stools for the past 3 days. Asked patient if she is seeing blood in the stools and she says "red color." She feels bloated. Denies constipation or difficult stooling. Afebrile, no nausea, and no abdominal pain. She does endorse some cramping. Patient asked if she can have labs or stool test.

## 2021-12-26 NOTE — Telephone Encounter (Signed)
Spoke with the patient. She wants to do her labs today. Orders are placed. She wants to wait for the results before scheduling her appointment.

## 2021-12-28 NOTE — Progress Notes (Deleted)
[name] D.[place] Phone: [phone]   Assessment and Plan:     There are no diagnoses linked to this encounter.  *** - Patient has received significant relief with OMT in the past.  Elects for repeat OMT today.  Tolerated well per note below. - Decision today to treat with OMT was based on Physical Exam   After verbal consent patient was treated with HVLA (high velocity low amplitude), ME (muscle energy), FPR (flex positional release), ST (soft tissue), PC/PD (Pelvic Compression/ Pelvic Decompression) techniques in cervical, rib, thoracic, lumbar, and pelvic areas. Patient tolerated the procedure well with improvement in symptoms.  Patient educated on potential side effects of soreness and recommended to rest, hydrate, and use Tylenol as needed for pain control.   Pertinent previous records reviewed include ***   Follow Up: ***     Subjective:   I, [name], am serving as a Education administrator for Doctor Glennon Mac   Chief Complaint: OMT follow up    HPI:  03/23/21 Patient with chronic back/neck pain hoping to avoid surgical intervention.   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 She feels like she is doing better. Meloxicam is helping    05/25/2021 Patient states that she's okay will hopefully be getting a CT scan soon , just here for some maintenance     06/25/2021 Patient states that she's feeling pretty good isn't in as much pain as she had been having still stiff at times needs her adjustment    07/23/2021 Patient states  time for a tune up, neck and shoulder are tight giving her a little bit of a headache    08/13/2021 Patient states that she is doing the neck and shoulder are tight still getting a headache   5/16/023 Patient states she is doing pain is intermittent but over all better    10/02/2021 Patient states time for a tune up    11/01/2021 Patient states that she is a little tight this week    12/04/2021 Patient states the pain isnt as intense , has been in [place]    12/31/2021 Patient states    Relevant Historical Information: Hashimoto's, DDD lumbar spine, history of SVT  Additional pertinent review of systems negative.  Current Outpatient Medications  Medication Sig Dispense Refill   Cholecalciferol (VITAMIN D-3) 125 MCG (5000 UT) TABS Take 5,000 Units by mouth See admin instructions. Monday and Friday 30 tablet    dicyclomine (BENTYL) 20 MG tablet Take 1 tablet (20 mg total) by mouth 2 (two) times daily as needed for spasms. 30 tablet 1   ezetimibe (ZETIA) 10 MG tablet Take 1 tablet (10 mg total) by mouth at bedtime. 90 tablet 3   fluticasone (FLONASE) 50 MCG/ACT nasal spray Place 2 sprays into both nostrils daily. 16 g 0   ibuprofen (ADVIL) 200 MG tablet Take 400-600 mg by mouth daily as needed for moderate pain.     ipratropium (ATROVENT) 0.03 % nasal spray Place 2 sprays into both nostrils 3 (three) times daily as needed for rhinitis. 30 mL 0  levothyroxine (SYNTHROID) 75 MCG tablet Take 75 mcg by mouth daily before breakfast.     losartan (COZAAR) 25 MG tablet Take 1 tablet (25 mg total) by mouth daily. 90 tablet 2   metoprolol succinate (TOPROL-XL) 50 MG 24 hr tablet TAKE 1 TABLET BY MOUTH EVERY DAY WITH OR IMMEDIATELY FOLLOWING A MEAL 90 tablet 1   Multiple Vitamin (MULTIVITAMIN WITH MINERALS) TABS tablet Take 1 tablet by mouth daily.     omeprazole (PRILOSEC) 40 MG capsule Take 1 capsule (40 mg total) by mouth daily. Once daily 30 capsule 2   polyethylene glycol (MIRALAX /  GLYCOLAX) packet Take 17 g by mouth daily as needed for mild constipation.      Polyvinyl Alcohol-Povidone PF (REFRESH) 1.4-0.6 % SOLN Apply 1 drop to eye 3 (three) times daily as needed (for dry eyes).     pravastatin (PRAVACHOL) 20 MG tablet Take 1 tablet (20 mg total) by mouth every evening. 30 tablet 11   Probiotic Product (PROBIOTIC ADVANCED PO) Take 1 capsule by mouth daily.      RESTASIS 0.05 % ophthalmic emulsion Place 1 drop into both eyes 2 (two) times daily.     White Petrolatum-Mineral Oil (SYSTANE NIGHTTIME) OINT SMARTSIG:1 Inch(es) In Eye(s) Every Night     Current Facility-Administered Medications  Medication Dose Route Frequency Provider Last Rate Last Admin   0.9 %  sodium chloride infusion  500 mL Intravenous Once Sharyn Creamer, MD          Objective:     There were no vitals filed for this visit.    There is no height or weight on file to calculate BMI.    Physical Exam:     General: Well-appearing, cooperative, sitting comfortably in no acute distress.   OMT Physical Exam:  ASIS Compression Test: Positive Right Cervical: TTP paraspinal, *** Rib: Bilateral elevated first rib with TTP Thoracic: TTP paraspinal,*** Lumbar: TTP paraspinal,*** Pelvis: Right anterior innominate  Electronically signed by:  [name] D.[facility] 9:46 AM 12/28/21

## 2021-12-29 ENCOUNTER — Emergency Department: Payer: Medicare HMO

## 2021-12-29 ENCOUNTER — Other Ambulatory Visit: Payer: Self-pay

## 2021-12-29 ENCOUNTER — Encounter: Payer: Self-pay | Admitting: Emergency Medicine

## 2021-12-29 ENCOUNTER — Emergency Department
Admission: EM | Admit: 2021-12-29 | Discharge: 2021-12-29 | Disposition: A | Discharge status: Home / Self Care | Payer: Medicare HMO | Attending: Emergency Medicine | Admitting: Emergency Medicine

## 2021-12-29 DIAGNOSIS — M79604 Pain in right leg: Secondary | ICD-10-CM | POA: Diagnosis present

## 2021-12-29 DIAGNOSIS — M7121 Synovial cyst of popliteal space [Baker], right knee: Secondary | ICD-10-CM | POA: Diagnosis not present

## 2021-12-29 DIAGNOSIS — E039 Hypothyroidism, unspecified: Secondary | ICD-10-CM | POA: Insufficient documentation

## 2021-12-29 NOTE — ED Triage Notes (Signed)
Pt here from [facility] with right posterior calf pain since yesterday. Pt right calf slightly warmer on palpation. Pt sent here to rule out DVT.

## 2021-12-29 NOTE — Discharge Instructions (Signed)
Your ultrasound does not show any blood clots, though it does show a cyst.  You may continue to rest, ice, elevate your leg and take ibuprofen/Tylenol per package instructions as needed for pain.  Please return for any new, worsening, or change in symptoms or other concerns.  Please follow-up with your outpatient provider.

## 2021-12-29 NOTE — ED Provider Notes (Signed)
[facility] Provider Note    Event Date/Time   First MD Initiated Contact with Patient 12/29/21 1059     (approximate)   History   Leg Pain   HPI  [name] is a 71 y.o. female who presents today for evaluation of right calf pain.  She reports that her pain began yesterday.  She feels that it is firm to touch.  She has pain with palpation.  She denies any injury.  She does not have a history of PE/DVT, however she reports that her father has had pulmonary embolisms.  She went to [place] [facility] earlier today and was sent to the emergency department for further evaluation.  She denies any exogenous hormone use, recent hospitalizations, recent travel or periods of immobilization, or history of malignancy.  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   Abnormal CT of the abdomen 02/25/2018   Abdominal pain, right upper quadrant 02/25/2018   Abnormal finding on radiology exam 02/25/2018   Allergic rhinitis 02/25/2018   Brash 02/25/2018   CN (constipation) 02/25/2018   LBP (low back pain) 02/25/2018   Combined form of age-related cataract, both eyes 04/07/2017   Posterior vitreous detachment of right eye 04/07/2017   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   CIN I (cervical intraepithelial neoplasia I) 01/02/2016   Preoperative cardiovascular examination 11/10/2015   ASCUS with positive high risk HPV cervical 12/13/2014   Eyelid retraction 12/02/2014   Dry eye syndrome of bilateral lacrimal glands 12/01/2014   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   Other disorders affecting eyelid function 12/01/2014   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   Peripheral visual field defect of both eyes 12/01/2014   Age-related nuclear cataract of both  eyes 09/06/2014   Anatomical narrow angle of both eyes 09/06/2014   Hx of Hashimoto thyroiditis 09/06/2014   Keratoconjunctivitis sicca of both eyes not specified as Sjogren's 09/06/2014   Meibomian gland dysfunction (MGD) of upper and lower lids of both eyes 09/06/2014   Abnormal Papanicolaou smear of cervix with positive human papilloma virus (HPV) test 06/27/2014   Hypokalemia 03/04/2014   History of paroxysmal supraventricular tachycardia 02/25/2014   Osteopenia 04/30/2013   Dysphonia 04/05/2013   Laryngopharyngeal reflux 04/05/2013   Hashimoto's thyroiditis 03/29/2013   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   Hypercalcemia 05/25/2012   Hypothyroidism 04/27/2012   Hyperlipidemia 04/27/2012   Paroxysmal supraventricular tachycardia ([place]) 04/27/2012   Tachycardia 01/22/2012   Endometrial polyp 12/04/2010   Fibroid, uterus 11/27/2010   Acquired hypothyroidism 01/17/2010   Other and unspecified hyperlipidemia 02/01/2008   Other specified cardiac dysrhythmias(427.89) 12/07/2007          Physical Exam   Triage Vital Signs: ED Triage Vitals [12/29/21 1036]  Enc Vitals Group     BP (!) 158/90     Pulse Rate (!) 57     Resp 16     Temp 98.4 F (36.9 C)     Temp Source Oral     SpO2 95 %     Weight 167 lb 15.9 oz (76.2 kg)     Height '5\' 1"'$  (1.549 m)     Head Circumference      Peak Flow      Pain Score 5     Pain Loc  Pain Edu?      Excl. in [place]?     Most recent vital signs: Vitals:   12/29/21 1036 12/29/21 1145  BP: (!) 158/90 (!) 150/88  Pulse: (!) 57 60  Resp: 16 16  Temp: 98.4 F (36.9 C)   SpO2: 95% 95%    Physical Exam Vitals and nursing note reviewed.  Constitutional:      General: Awake and alert. No acute distress.    Appearance: Normal appearance. The patient is normal weight.  HENT:     Head: Normocephalic and atraumatic.     Mouth: Mucous membranes are moist.  Eyes:     General: PERRL. Normal EOMs         Right eye: No discharge.        Left eye: No discharge.     Conjunctiva/sclera: Conjunctivae normal.  Cardiovascular:     Rate and Rhythm: Normal rate and regular rhythm.     Pulses: Normal pulses.  Pulmonary:     Effort: Pulmonary effort is normal. No respiratory distress.  Abdominal:     Abdomen is soft. There is no abdominal tenderness. No rebound or guarding. No distention. Musculoskeletal:        General: No swelling. Normal range of motion.     Cervical back: Normal range of motion and neck supple.  Very minimal tenderness to palpation to right upper calf without overlying skin color changes.  She has varicosities present.  There is no pitting edema.  She has full normal range of motion of her knee, no warmth or erythema.  Normal distal pulses.  Sensation and strength intact and normal bilaterally Skin:    General: Skin is warm and dry.     Capillary Refill: Capillary refill takes less than 2 seconds.     Findings: No rash.  Neurological:     Mental Status: The patient is awake and alert.      ED Results / Procedures / Treatments   Labs (all labs ordered are listed, but only abnormal results are displayed) Labs Reviewed - No data to display   EKG     RADIOLOGY I independently reviewed and interpreted imaging and agree with radiologists findings.     PROCEDURES:  Critical Care performed:   Procedures   MEDICATIONS ORDERED IN ED: Medications - No data to display   IMPRESSION / MDM / [place] / ED COURSE  I reviewed the triage vital signs and the nursing notes.   Differential diagnosis includes, but is not limited to, Baker's cyst, DVT, muscle strain versus spasm.  Patient is awake and alert, hemodynamically stable and afebrile.  There is no pitting edema to her right lower extremity.  [facility].  Duplex ultrasound was obtained in the emergency department which reveals no acute DVT, though does reveal  a small Baker's cyst.  I discussed this diagnosis with the patient.  She is relieved by this finding.  I recommended rest, ice, elevation, symptomatic management, and outpatient follow-up.  Patient understands and agrees with plan.  She was discharged in stable condition.   Patient's presentation is most consistent with acute complicated illness / injury requiring diagnostic workup.      FINAL CLINICAL IMPRESSION(S) / ED DIAGNOSES   Final diagnoses:  Popliteal cyst, right     Rx / DC Orders   ED Discharge Orders     None        Note:  This document was prepared  using Systems analyst and may include unintentional dictation errors.   Marquette Old, PA-C 12/29/21 1235    Delman Kitten, MD 12/29/21 1544

## 2021-12-31 ENCOUNTER — Ambulatory Visit: Payer: Medicare HMO | Admitting: Sports Medicine

## 2022-01-01 ENCOUNTER — Ambulatory Visit (INDEPENDENT_AMBULATORY_CARE_PROVIDER_SITE_OTHER): Payer: Medicare HMO

## 2022-01-01 ENCOUNTER — Ambulatory Visit (INDEPENDENT_AMBULATORY_CARE_PROVIDER_SITE_OTHER): Payer: Medicare HMO | Admitting: Sports Medicine

## 2022-01-01 VITALS — HR 81 | Ht 61.0 in | Wt 167.0 lb

## 2022-01-01 DIAGNOSIS — M1711 Unilateral primary osteoarthritis, right knee: Secondary | ICD-10-CM

## 2022-01-01 DIAGNOSIS — M25561 Pain in right knee: Secondary | ICD-10-CM

## 2022-01-01 DIAGNOSIS — G8929 Other chronic pain: Secondary | ICD-10-CM

## 2022-01-01 DIAGNOSIS — M25551 Pain in right hip: Secondary | ICD-10-CM

## 2022-01-01 NOTE — Progress Notes (Signed)
[name] D.[place] Phone: [phone]   Assessment and Plan:     1. Chronic pain of right knee 2. Primary osteoarthritis of right knee -Chronic with exacerbation, initial sports medicine visit - Consistent with acute flare of osteoarthritis based on HPI, physical exam, x-ray - X-ray obtained in clinic.  My interpretation: No acute fracture or dislocation.  Moderately decreased joint space primarily in the medial compartment consistent with osteoarthritis - Patient elected for intra-articular CSI.  Tolerated well per note below  Procedure: Knee Joint Injection Side: Right Indication: Flare of osteoarthritis  Risks explained and consent was given verbally. The site was cleaned with alcohol prep. A needle was introduced with an anterio-lateral approach. Injection given using 73m of 1% lidocaine without epinephrine and 122mof kenalog '40mg'$ /ml. This was well tolerated and resulted in symptomatic relief.  Needle was removed, hemostasis achieved, and post injection instructions were explained.   Pt was advised to call or return to clinic if these symptoms worsen or fail to improve as anticipated.   3. Chronic right hip pain  -Chronic with exacerbation - Minimal intra-articular changes based off of CT abdomen pelvis from 05/27/2021 - I suspect the patient's pain is likely compensatory due to chronic right knee pain.  We will treat right knee pain with CSI at today's visit and see if hip pain improves at follow-up  Pertinent previous records reviewed include CT abdomen pelvis from 05/27/2021 to review images of hip.   Follow Up: 2 weeks for reevaluation.  Could perform OMT at that time based on patient's symptoms.  Could consider alternative injection if no improvement or worsening of symptoms of knee pain.   Subjective:   I, MoPincus Badderam serving as a scEducation administratoror Doctor BeGlennon Mac Chief Complaint: OMT follow up     HPI:  03/23/21 Patient with chronic back/neck pain hoping to avoid surgical intervention.   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 She feels like she is doing better. Meloxicam is helping    05/25/2021 Patient states that she's okay will hopefully be getting a CT scan soon , just here for some maintenance     06/25/2021 Patient states that she's feeling pretty good isn't in as much pain as she had been having still stiff at times needs her adjustment    07/23/2021 Patient states time for a tune up, neck and shoulder are tight giving her a little bit of a headache    08/13/2021 Patient states that she is doing the neck and shoulder are tight still getting a headache   5/16/023 Patient states she is doing pain is intermittent but over all better    10/02/2021 Patient states time for a tune up    11/01/2021 Patient states that she is a little tight this week    12/04/2021 Patient states the pain isnt as intense , [place] 01/01/2022 Patient states ready for  tune and some      Relevant Historical Information: Hashimoto's, DDD lumbar spine, history of SVT  Additional pertinent review  of systems negative.  Current Outpatient Medications  Medication Sig Dispense Refill   Cholecalciferol (VITAMIN D-3) 125 MCG (5000 UT) TABS Take 5,000 Units by mouth See admin instructions. Monday and Friday 30 tablet    ezetimibe (ZETIA) 10 MG tablet Take 1 tablet (10 mg total) by mouth at bedtime. 90 tablet 3   levothyroxine (SYNTHROID) 75 MCG tablet Take 75 mcg by mouth daily before breakfast.      losartan (COZAAR) 25 MG tablet Take 1 tablet (25 mg total) by mouth daily. 90 tablet 2   metoprolol succinate (TOPROL-XL) 50 MG 24 hr tablet TAKE 1 TABLET BY MOUTH EVERY DAY WITH OR IMMEDIATELY FOLLOWING A MEAL 90 tablet 1   Multiple Vitamin (MULTIVITAMIN WITH MINERALS) TABS tablet Take 1 tablet by mouth daily.     omeprazole (PRILOSEC) 40 MG capsule Take 1 capsule (40 mg total) by mouth daily. Once daily 30 capsule 2   polyethylene glycol (MIRALAX / GLYCOLAX) packet Take 17 g by mouth daily as needed for mild constipation.      Polyvinyl Alcohol-Povidone PF (REFRESH) 1.4-0.6 % SOLN Apply 1 drop to eye 3 (three) times daily as needed (for dry eyes).     pravastatin (PRAVACHOL) 20 MG tablet Take 1 tablet (20 mg total) by mouth every evening. 30 tablet 11   Probiotic Product (PROBIOTIC ADVANCED PO) Take 1 capsule by mouth daily.      RESTASIS 0.05 % ophthalmic emulsion Place 1 drop into both eyes 2 (two) times daily.     White Petrolatum-Mineral Oil (SYSTANE NIGHTTIME) OINT SMARTSIG:1 Inch(es) In Eye(s) Every Night     dicyclomine (BENTYL) 20 MG tablet Take 1 tablet (20 mg total) by mouth 2 (two) times daily as needed for spasms. 30 tablet 1   fluticasone (FLONASE) 50 MCG/ACT nasal spray Place 2 sprays into both nostrils daily. 16 g 0   ibuprofen (ADVIL) 200 MG tablet Take 400-600 mg by mouth daily as needed for moderate pain.     ipratropium (ATROVENT) 0.03 % nasal spray Place 2 sprays into both nostrils 3 (three) times daily as needed for rhinitis. 30 mL 0   Current Facility-Administered Medications  Medication Dose Route Frequency Provider Last Rate Last Admin   0.9 %  sodium chloride infusion  500 mL Intravenous Once Sharyn Creamer, MD          Objective:     Vitals:   01/01/22 1445  Pulse: 81  SpO2: 98%  Weight: 167 lb (75.8 kg)  Height: '5\' 1"'$  (1.549 m)      Body mass index is 31.55 kg/m.    Physical Exam:     General:  awake, alert oriented, no acute distress nontoxic Skin: no  suspicious lesions or rashes Neuro:sensation intact, no deficits, strength 5/5 with no deficits, no atrophy, normal muscle tone Psych: No signs of anxiety, depression or other mood disorder  Right knee: Mild swelling No deformity Positive fluid wave, joint milking ROM Flex 100, Ext 5 TTP medial femoral condyle, lateral femoral condyle, medial joint line, lateral joint line NTTP over the quad tendon, patella, plica, patella tendon, tibial tuberostiy, fibular head, posterior fossa, pes anserine bursa, gerdy's tubercle,  Neg anterior and posterior drawer Neg lachman Neg sag sign Negative varus stress Negative valgus stress Negative Mc[place]  Gait normal   Electronically signed by:  [name] D.[facility] 2:59 PM 01/01/22

## 2022-01-01 NOTE — Patient Instructions (Addendum)
Good to see you   

## 2022-01-03 NOTE — Progress Notes (Deleted)
[name] D.[place] Phone: [phone]   Assessment and Plan:     There are no diagnoses linked to this encounter.  ***   Pertinent previous records reviewed include ***   Follow Up: ***     Subjective:   I, Terrell Shimko, am serving as a Education administrator for Doctor Glennon Mac   Chief Complaint: OMT follow up    HPI:  03/23/21 Patient with chronic back/neck pain hoping to avoid surgical intervention.   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 She feels like she is doing better. Meloxicam is helping    05/25/2021 Patient states that she's okay will hopefully be getting a CT scan soon , just here for some maintenance     06/25/2021 Patient states that she's feeling pretty good isn't in as much pain as she had been having still stiff at times needs her adjustment    07/23/2021 Patient states time for a tune up, neck and shoulder are tight giving her a little bit of a headache    08/13/2021 Patient states that she is doing the neck and shoulder are tight still getting a headache   5/16/023 Patient states she is doing pain is intermittent but over all better    10/02/2021 Patient states time for a tune up    11/01/2021 Patient states that she is a little tight this week    12/04/2021 Patient states the pain isnt as intense , has been in [place]    01/01/2022 Patient states ready for  tune and some    01/16/2022 Patient states   Relevant Historical Information: Hashimoto's, DDD lumbar spine, history of  SVT  Additional pertinent review of systems negative.   Current Outpatient Medications:    Cholecalciferol (VITAMIN D-3) 125 MCG (5000 UT) TABS, Take 5,000 Units by mouth See admin instructions. Monday and Friday, Disp: 30 tablet, Rfl:    dicyclomine (BENTYL) 20 MG tablet, Take 1 tablet (20 mg total) by mouth 2 (two) times daily as needed for spasms., Disp: 30 tablet, Rfl: 1   ezetimibe (ZETIA) 10 MG tablet, Take 1 tablet (10 mg total) by mouth at bedtime., Disp: 90 tablet, Rfl: 3   fluticasone (FLONASE) 50 MCG/ACT nasal spray, Place 2 sprays into both nostrils daily., Disp: 16 g, Rfl: 0   ibuprofen (ADVIL) 200 MG tablet, Take 400-600 mg by mouth daily as needed for moderate pain., Disp: , Rfl:    ipratropium (ATROVENT) 0.03 % nasal spray, Place 2 sprays into both nostrils 3 (three) times daily as needed for rhinitis., Disp: 30 mL, Rfl: 0   levothyroxine (SYNTHROID) 75 MCG tablet, Take 75 mcg by mouth daily before breakfast., Disp: , Rfl:    losartan (COZAAR) 25 MG tablet, Take 1 tablet (25 mg total) by mouth daily., Disp: 90 tablet, Rfl: 2   metoprolol succinate (TOPROL-XL) 50 MG 24 hr tablet, TAKE 1 TABLET BY MOUTH EVERY DAY WITH OR IMMEDIATELY FOLLOWING A MEAL, Disp: 90 tablet, Rfl: 1   Multiple Vitamin (MULTIVITAMIN WITH MINERALS) TABS tablet, Take 1 tablet by mouth daily., Disp: ,  Rfl:    omeprazole (PRILOSEC) 40 MG capsule, Take 1 capsule (40 mg total) by mouth daily. Once daily, Disp: 30 capsule, Rfl: 2   polyethylene glycol (MIRALAX / GLYCOLAX) packet, Take 17 g by mouth daily as needed for mild constipation. , Disp: , Rfl:    Polyvinyl Alcohol-Povidone PF (REFRESH) 1.4-0.6 % SOLN, Apply 1 drop to eye 3 (three) times daily as needed (for dry eyes)., Disp: , Rfl:    pravastatin (PRAVACHOL) 20 MG tablet, Take 1 tablet (20 mg total) by mouth every evening., Disp: 30 tablet, Rfl: 11   Probiotic Product (PROBIOTIC ADVANCED PO), Take 1 capsule by mouth daily. , Disp: , Rfl:    RESTASIS 0.05 %  ophthalmic emulsion, Place 1 drop into both eyes 2 (two) times daily., Disp: , Rfl:    White Petrolatum-Mineral Oil (SYSTANE NIGHTTIME) OINT, SMARTSIG:1 Inch(es) In Eye(s) Every Night, Disp: , Rfl:   Current Facility-Administered Medications:    0.9 %  sodium chloride infusion, 500 mL, Intravenous, Once, Sharyn Creamer, MD   Objective:     There were no vitals filed for this visit.    There is no height or weight on file to calculate BMI.    Physical Exam:    ***   Electronically signed by:  [name] D.[facility] 12:59 PM 01/03/22

## 2022-01-04 ENCOUNTER — Ambulatory Visit: Payer: Medicare HMO | Admitting: Sports Medicine

## 2022-01-07 ENCOUNTER — Telehealth: Admit: 2022-01-07 | Payer: PRIVATE HEALTH INSURANCE | Primary: Family Medicine

## 2022-01-07 NOTE — Telephone Encounter
Pt called stated she was not a N/s 7/3 r/s appt to 05/2022

## 2022-01-16 ENCOUNTER — Ambulatory Visit: Payer: Medicare HMO | Admitting: Sports Medicine

## 2022-01-17 ENCOUNTER — Ambulatory Visit: Payer: Medicare HMO | Admitting: Sports Medicine

## 2022-01-17 VITALS — BP 122/81 | HR 77 | Ht 61.0 in | Wt 166.0 lb

## 2022-01-17 DIAGNOSIS — M9903 Segmental and somatic dysfunction of lumbar region: Secondary | ICD-10-CM

## 2022-01-17 DIAGNOSIS — M1711 Unilateral primary osteoarthritis, right knee: Secondary | ICD-10-CM

## 2022-01-17 DIAGNOSIS — M9905 Segmental and somatic dysfunction of pelvic region: Secondary | ICD-10-CM

## 2022-01-17 DIAGNOSIS — G8929 Other chronic pain: Secondary | ICD-10-CM

## 2022-01-17 DIAGNOSIS — M9908 Segmental and somatic dysfunction of rib cage: Secondary | ICD-10-CM

## 2022-01-17 DIAGNOSIS — M25561 Pain in right knee: Secondary | ICD-10-CM | POA: Diagnosis not present

## 2022-01-17 DIAGNOSIS — M542 Cervicalgia: Secondary | ICD-10-CM

## 2022-01-17 DIAGNOSIS — M9901 Segmental and somatic dysfunction of cervical region: Secondary | ICD-10-CM

## 2022-01-17 DIAGNOSIS — M546 Pain in thoracic spine: Secondary | ICD-10-CM

## 2022-01-17 DIAGNOSIS — M9902 Segmental and somatic dysfunction of thoracic region: Secondary | ICD-10-CM

## 2022-01-17 NOTE — Patient Instructions (Signed)
Good to see you   

## 2022-01-17 NOTE — Progress Notes (Signed)
[name] D.[place] Phone: [phone]   Assessment and Plan:     1. Chronic pain of right knee 2. Primary osteoarthritis of right knee -Chronic with exacerbation, subsequent visit - Significant improvement in right knee pain after intra-articular CSI at previous office visit - May follow-up as needed for repeat CSI  3. Chronic bilateral thoracic back pain 4. Neck pain 5. Somatic dysfunction of cervical region 6. Somatic dysfunction of thoracic region 7. Somatic dysfunction of lumbar region 8. Somatic dysfunction of pelvic region 9. Somatic dysfunction of rib region  -Chronic with exacerbation, subsequent visit - Recurrence of multiple musculoskeletal complaints with most prominent being in upper back and neck - Patient has received significant relief with OMT in the past.  Elects for repeat OMT today.  Tolerated well per note below. - Decision today to treat with OMT was based on Physical Exam  After verbal consent patient was treated with HVLA (high velocity low amplitude), ME (muscle energy), FPR (flex positional release), ST (soft tissue), PC/PD (Pelvic Compression/ Pelvic Decompression) techniques in cervical, rib, thoracic, lumbar, and pelvic areas. Patient tolerated the procedure well with improvement in symptoms.  Patient educated on potential side effects of soreness and recommended to rest, hydrate, and use Tylenol as needed for pain control.   Pertinent previous records reviewed include none   Follow Up: 4 weeks for reevaluation.  Could consider repeat OMT   Subjective:   I, [name], am serving as a Education administrator for Doctor [name]   Chief Complaint: OMT follow up    HPI:  03/23/21 Patient with chronic back/neck pain hoping to avoid surgical intervention.   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 She feels like she is doing better. Meloxicam is helping    05/25/2021 Patient states that she's okay will hopefully be getting a CT scan soon , just here for some maintenance     06/25/2021 Patient states that she's feeling pretty good isn't in as much pain as she had been having still stiff at times needs her adjustment    07/23/2021 Patient states time for a tune up, neck and shoulder are tight giving her a little bit of a headache    08/13/2021 Patient states that she is doing the neck and shoulder are tight still getting a headache   5/16/023 Patient states she is doing pain is intermittent but over all better    10/02/2021 Patient states time for a tune up    11/01/2021 Patient states that she is a little tight this week    12/04/2021 Patient states the pain isnt as intense , has been in [place]    01/01/2022 Patient states ready for  tune and some   01/17/2022 Patient states the knee is great , hip is better , here for adjustment in the neck and shoulders   Relevant Historical Information: Hashimoto's, DDD lumbar spine, history of SVT  Additional pertinent review of systems negative.   Current Outpatient Medications:    Cholecalciferol (VITAMIN D-3) 125 MCG (5000 UT) TABS, Take 5,000 Units by  mouth See admin instructions. Monday and Friday, Disp: 30 tablet, Rfl:    ezetimibe (ZETIA) 10 MG tablet, Take 1 tablet (10 mg total) by mouth at bedtime., Disp: 90 tablet, Rfl: 3   levothyroxine (SYNTHROID) 75 MCG tablet, Take 75 mcg by mouth daily before breakfast., Disp: , Rfl:    losartan (COZAAR) 25 MG tablet, Take 1 tablet (25 mg total) by mouth daily., Disp: 90 tablet,  Rfl: 2   metoprolol succinate (TOPROL-XL) 50 MG 24 hr tablet, TAKE 1 TABLET BY MOUTH EVERY DAY WITH OR IMMEDIATELY FOLLOWING A MEAL, Disp: 90 tablet, Rfl: 1   Multiple Vitamin (MULTIVITAMIN WITH MINERALS) TABS tablet, Take 1 tablet by mouth daily., Disp: , Rfl:    omeprazole (PRILOSEC) 40 MG capsule, Take 1 capsule (40 mg total) by mouth daily. Once daily, Disp: 30 capsule, Rfl: 2   polyethylene glycol (MIRALAX / GLYCOLAX) packet, Take 17 g by mouth daily as needed for mild constipation. , Disp: , Rfl:    Polyvinyl Alcohol-Povidone PF (REFRESH) 1.4-0.6 % SOLN, Apply 1 drop to eye 3 (three) times daily as needed (for dry eyes)., Disp: , Rfl:    pravastatin (PRAVACHOL) 20 MG tablet, Take 1 tablet (20 mg total) by mouth every evening., Disp: 30 tablet, Rfl: 11   Probiotic Product (PROBIOTIC ADVANCED PO), Take 1 capsule by mouth daily. , Disp: , Rfl:    RESTASIS 0.05 % ophthalmic emulsion, Place 1 drop into both eyes 2 (two) times daily., Disp: , Rfl:    White Petrolatum-Mineral Oil (SYSTANE NIGHTTIME) OINT, SMARTSIG:1 Inch(es) In Eye(s) Every Night, Disp: , Rfl:    dicyclomine (BENTYL) 20 MG tablet, Take 1 tablet (20 mg total) by mouth 2 (two) times daily as needed for spasms., Disp: 30 tablet, Rfl: 1   fluticasone (FLONASE) 50 MCG/ACT nasal spray, Place 2 sprays into both nostrils daily., Disp: 16 g, Rfl: 0   ibuprofen (ADVIL) 200 MG tablet, Take 400-600 mg by mouth daily as needed for moderate pain., Disp: , Rfl:    ipratropium (ATROVENT) 0.03 % nasal spray, Place 2 sprays into both nostrils 3 (three) times daily as needed for rhinitis., Disp: 30 mL, Rfl: 0  Current Facility-Administered Medications:    0.9 %  sodium chloride infusion, 500 mL, Intravenous, Once, Sharyn Creamer, MD   Objective:     Vitals:   01/17/22 1444  BP: 122/81  Pulse: 77  SpO2: 96%  Weight: 166 lb (75.3 kg)  Height: '5\' 1"'$  (1.549 m)      Body mass index is 31.37 kg/m.    Physical Exam:    General:  Well-appearing, cooperative, sitting comfortably in no acute distress.   OMT Physical Exam:  ASIS Compression Test: Positive Right Cervical: TTP paraspinal, C3 RRSL Rib: Bilateral elevated first rib with TTP Thoracic: TTP paraspinal, T4 RRSL, T7 or SL Lumbar: TTP paraspinal, L1-3 RRSL Pelvis: Right anterior innominate    Electronically signed by:  [name] D.[facility] 3:00 PM 01/17/22

## 2022-02-05 ENCOUNTER — Other Ambulatory Visit: Payer: Self-pay | Admitting: Nurse Practitioner

## 2022-02-05 ENCOUNTER — Ambulatory Visit (INDEPENDENT_AMBULATORY_CARE_PROVIDER_SITE_OTHER): Payer: Medicare HMO

## 2022-02-05 DIAGNOSIS — Z780 Asymptomatic menopausal state: Secondary | ICD-10-CM

## 2022-02-05 DIAGNOSIS — Z13820 Encounter for screening for osteoporosis: Secondary | ICD-10-CM

## 2022-02-05 DIAGNOSIS — M8589 Other specified disorders of bone density and structure, multiple sites: Secondary | ICD-10-CM

## 2022-02-07 NOTE — Progress Notes (Signed)
[name] D.[place] Phone: [phone]   Assessment and Plan:     1. Neck pain 2. Chronic bilateral low back pain without sciatica 3. Somatic dysfunction of cervical region 4. Somatic dysfunction of thoracic region 5. Somatic dysfunction of lumbar region 6. Somatic dysfunction of pelvic region 7. Somatic dysfunction of rib region  -Chronic with exacerbation, subsequent visit - Recurrence of multiple musculoskeletal complaints most prominent being in neck and lower back - Patient has received significant relief with OMT in the past.  Elects for repeat OMT today.  Tolerated well per note below. - Decision today to treat with OMT was based on Physical Exam  After verbal consent patient was treated with HVLA (high velocity low amplitude), ME (muscle energy), FPR (flex positional release), ST (soft tissue), PC/PD (Pelvic Compression/ Pelvic Decompression) techniques in cervical, rib, thoracic, lumbar, and pelvic areas. Patient tolerated the procedure well with improvement in symptoms.  Patient educated on potential side effects of soreness and recommended to rest, hydrate, and use Tylenol as needed for pain control.  Pertinent previous records reviewed include none   Follow Up: 4 weeks for reevaluation.  Consider repeat OMT if needed   Subjective:   I, [name], am serving as a Education administrator for Doctor Glennon Mac   Chief Complaint: OMT follow up    HPI:  03/23/21 Patient with chronic back/neck pain hoping to avoid surgical intervention.   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 She feels like she is doing better. Meloxicam is helping    05/25/2021 Patient states that she's okay will hopefully be getting a CT scan soon , just here for some maintenance     06/25/2021 Patient states that she's feeling pretty good isn't in as much pain as she had been having still stiff at times needs her adjustment    07/23/2021 Patient states time for a tune up, neck and shoulder are tight giving her a little bit of a headache    08/13/2021 Patient states that she is doing the neck and shoulder are tight still getting a headache   5/16/023 Patient states she is doing pain is intermittent but over all better    10/02/2021 Patient states time for a tune up    11/01/2021 Patient states that she is a little tight this week    12/04/2021 Patient states the pain isnt as intense , has been in [place]    01/01/2022 Patient states ready for  tune and some    01/17/2022 Patient states the knee is great , hip is better , here for adjustment in the neck and shoulders   02/08/2022 Patient states she is in pain , neck shoulder and lower back    Relevant Historical Information: Hashimoto's, DDD lumbar spine, history of SVT    Additional pertinent review of systems negative.   Current Outpatient Medications:    Cholecalciferol (VITAMIN D-3) 125 MCG (5000 UT) TABS, Take 5,000 Units by mouth See admin instructions. Monday and Friday, Disp: 30 tablet, Rfl:    ezetimibe (ZETIA) 10 MG tablet, Take  1 tablet (10 mg total) by mouth at bedtime., Disp: 90 tablet, Rfl: 3   levothyroxine (SYNTHROID) 75 MCG tablet, Take 75 mcg by mouth daily before breakfast., Disp: , Rfl:    losartan (COZAAR) 25 MG tablet, Take 1 tablet (25 mg total) by mouth daily., Disp: 90 tablet, Rfl: 2   metoprolol succinate (TOPROL-XL) 50 MG 24 hr tablet, TAKE 1 TABLET BY MOUTH EVERY DAY WITH OR IMMEDIATELY FOLLOWING A MEAL, Disp: 90 tablet, Rfl:  1   Multiple Vitamin (MULTIVITAMIN WITH MINERALS) TABS tablet, Take 1 tablet by mouth daily., Disp: , Rfl:    omeprazole (PRILOSEC) 40 MG capsule, Take 1 capsule (40 mg total) by mouth daily. Once daily, Disp: 30 capsule, Rfl: 2   polyethylene glycol (MIRALAX / GLYCOLAX) packet, Take 17 g by mouth daily as needed for mild constipation. , Disp: , Rfl:    Polyvinyl Alcohol-Povidone PF (REFRESH) 1.4-0.6 % SOLN, Apply 1 drop to eye 3 (three) times daily as needed (for dry eyes)., Disp: , Rfl:    pravastatin (PRAVACHOL) 20 MG tablet, Take 1 tablet (20 mg total) by mouth every evening., Disp: 30 tablet, Rfl: 11   Probiotic Product (PROBIOTIC ADVANCED PO), Take 1 capsule by mouth daily. , Disp: , Rfl:    RESTASIS 0.05 % ophthalmic emulsion, Place 1 drop into both eyes 2 (two) times daily., Disp: , Rfl:    White Petrolatum-Mineral Oil (SYSTANE NIGHTTIME) OINT, SMARTSIG:1 Inch(es) In Eye(s) Every Night, Disp: , Rfl:    dicyclomine (BENTYL) 20 MG tablet, Take 1 tablet (20 mg total) by mouth 2 (two) times daily as needed for spasms., Disp: 30 tablet, Rfl: 1   fluticasone (FLONASE) 50 MCG/ACT nasal spray, Place 2 sprays into both nostrils daily., Disp: 16 g, Rfl: 0   ibuprofen (ADVIL) 200 MG tablet, Take 400-600 mg by mouth daily as needed for moderate pain., Disp: , Rfl:    ipratropium (ATROVENT) 0.03 % nasal spray, Place 2 sprays into both nostrils 3 (three) times daily as needed for rhinitis., Disp: 30 mL, Rfl: 0  Current Facility-Administered Medications:    0.9 %  sodium chloride infusion, 500 mL, Intravenous, Once, Sharyn Creamer, MD   Objective:     Vitals:   02/08/22 0755  BP: 120/82  Pulse: (!) 48  SpO2: 99%  Weight: 165 lb (74.8 kg)  Height: '5\' 1"'$  (1.549 m)      Body mass index is 31.18 kg/m.    Physical Exam:    General: Well-appearing, cooperative, sitting comfortably in no acute distress.   OMT Physical Exam:  ASIS Compression Test: Positive Right Cervical: TTP paraspinal, C3  RLSR Rib: Right elevated first rib with TTP Thoracic: TTP paraspinal, T4-7 RRSL Lumbar: TTP paraspinal, L1-3 RRSL Pelvis: Right anterior innominate    Electronically signed by:  [name] D.[facility] 8:10 AM 02/08/22

## 2022-02-08 ENCOUNTER — Ambulatory Visit: Payer: Medicare HMO | Admitting: Sports Medicine

## 2022-02-08 VITALS — BP 120/82 | HR 48 | Ht 61.0 in | Wt 165.0 lb

## 2022-02-08 DIAGNOSIS — M9905 Segmental and somatic dysfunction of pelvic region: Secondary | ICD-10-CM

## 2022-02-08 DIAGNOSIS — M542 Cervicalgia: Secondary | ICD-10-CM | POA: Diagnosis not present

## 2022-02-08 DIAGNOSIS — M545 Low back pain, unspecified: Secondary | ICD-10-CM | POA: Diagnosis not present

## 2022-02-08 DIAGNOSIS — M9908 Segmental and somatic dysfunction of rib cage: Secondary | ICD-10-CM

## 2022-02-08 DIAGNOSIS — M9901 Segmental and somatic dysfunction of cervical region: Secondary | ICD-10-CM

## 2022-02-08 DIAGNOSIS — M9902 Segmental and somatic dysfunction of thoracic region: Secondary | ICD-10-CM

## 2022-02-08 DIAGNOSIS — M9903 Segmental and somatic dysfunction of lumbar region: Secondary | ICD-10-CM

## 2022-02-08 DIAGNOSIS — G8929 Other chronic pain: Secondary | ICD-10-CM

## 2022-02-08 NOTE — Patient Instructions (Signed)
Good to see you   

## 2022-02-11 ENCOUNTER — Telehealth: Payer: Self-pay

## 2022-02-11 NOTE — Telephone Encounter (Signed)
Patient called because she received the copy of bone density result that our office mailed out to her.  She said there are supplements recommended with no doseages recommended.  Recommended supplements were Vitamin D, Vitamin K2, and "Calcium 1.5gm total".  Patient wants a call advising. What to advise her regarding doses?

## 2022-02-12 NOTE — Telephone Encounter (Signed)
Called patient and per DPR access note on file left detailed message in voice mail with Dr. Mariah Milling reply regarding supplement amounts.

## 2022-02-12 NOTE — Telephone Encounter (Signed)
Patient called back asking if Dr. Marguerita Merles has checked her Calcium level blood work. She states she "tend to run high or borderline and other physicians have cautioned me about taking Calcium supplements".  CMET 06/01/21  Calcium 10.3  (Normals 8.9-10.3)  Please advise what to tell her.

## 2022-02-12 NOTE — Telephone Encounter (Signed)
Princess Bruins, MD  You 3 hours ago (8:39 AM)   Ca++ 1.5 g/d total, she counts how much in diet and takes a supplement for what is missing.  Vit D was high in 03/2021, she can continue what she was doing at that time.  Vit K2 for women is 100 microgram per day.

## 2022-02-13 NOTE — Telephone Encounter (Signed)
Jessica Bruins, MD  You Yesterday (1:17 PM)    Upper normal.  She can just take Ca++ in her food with no supplement.     Spoke with patient and informed her.

## 2022-02-14 ENCOUNTER — Ambulatory Visit: Payer: Medicare HMO | Admitting: Sports Medicine

## 2022-02-18 ENCOUNTER — Other Ambulatory Visit: Payer: Self-pay

## 2022-02-18 DIAGNOSIS — R011 Cardiac murmur, unspecified: Secondary | ICD-10-CM

## 2022-02-18 DIAGNOSIS — I10 Essential (primary) hypertension: Secondary | ICD-10-CM

## 2022-02-18 MED ORDER — LOSARTAN POTASSIUM 25 MG PO TABS: 25.0000 mg | ORAL_TABLET | Freq: Every day | ORAL | 2 refills | Status: DC

## 2022-03-06 IMAGING — CT CT CARDIAC CORONARY ARTERY CALCIUM SCORE
3 series · 14 of 20 positions shown, 15 images · non-contrast
Comparison: Chest CTA 01/17/2013.
COMPARISON: Chest CTA 01/17/2013.

Addendum:
EXAM:
OVER-READ INTERPRETATION  CT CHEST

The following report is an over-read performed by radiologist Dr.
Boghaba Zayda [REDACTED] on 10/31/2020. This
over-read does not include interpretation of cardiac or coronary
anatomy or pathology. The coronary calcium score interpretation by
the cardiologist is attached.
CLINICAL DATA: Cardiovascular Disease Risk stratification
Coronary Calcium Score
TECHNIQUE: A gated, non-contrast computed tomography scan of the heart was
performed using 3mm slice thickness. Axial images were analyzed on a
dedicated workstation. Calcium scoring of the coronary arteries was
performed using the Agatston method.

[Series 2: casc 3.0 bv41 2 bestdiast 72 % · axial · 0.41mm/px · z∈[-178,-88]mm · 4 of 50 slices shown, 5 images]
[im 10/50  vessel]
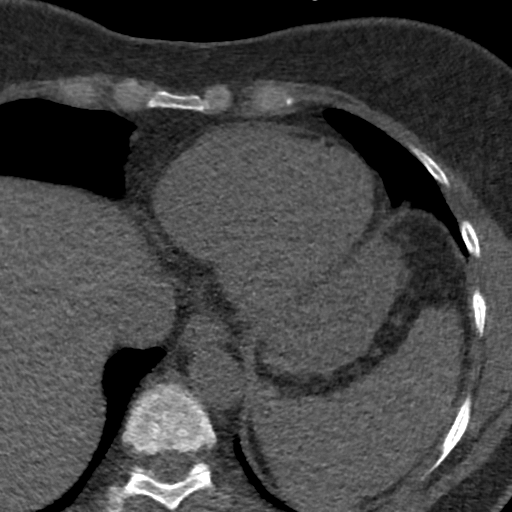
[im 10/50  lung]
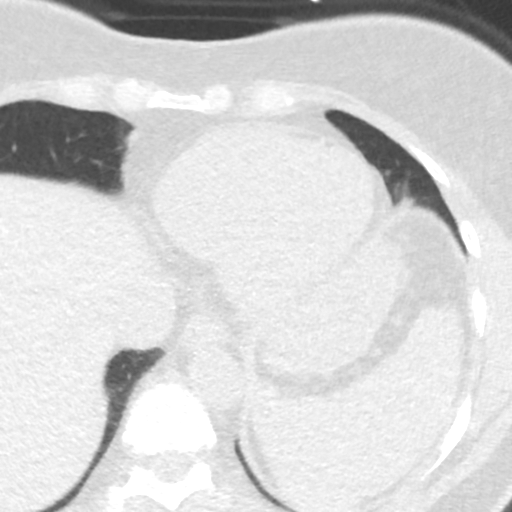
[im 20/50  vessel]
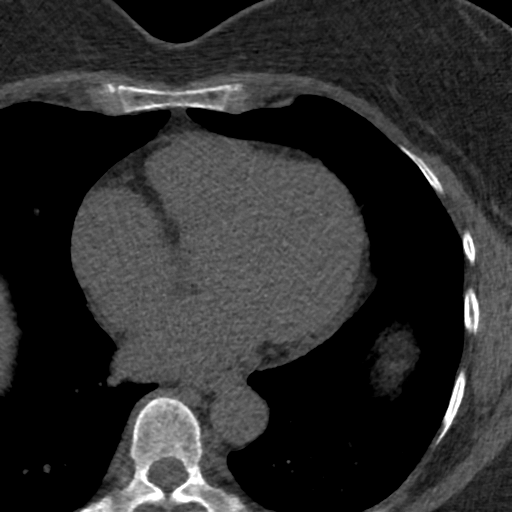
[im 30/50  vessel]
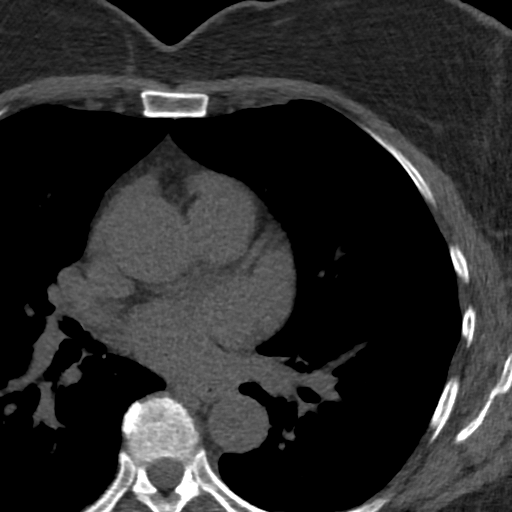
[im 40/50  vessel]
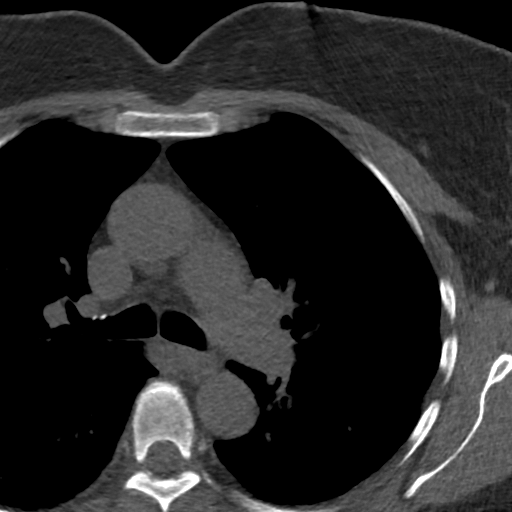

[Series 3: lung 75 % · axial · 0.68mm/px · z∈[-181,-85]mm · 5 of 50 slices shown]
[im 9/50  lung]
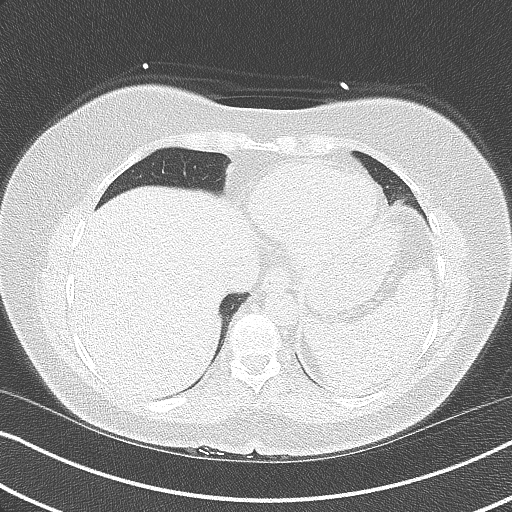
[im 17/50  lung]
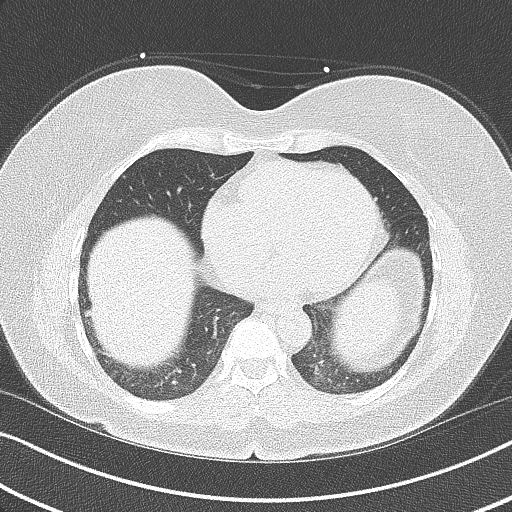
[im 25/50  lung]
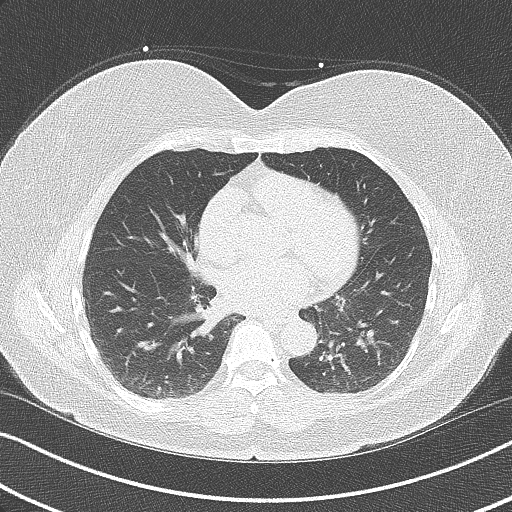
[im 33/50  lung]
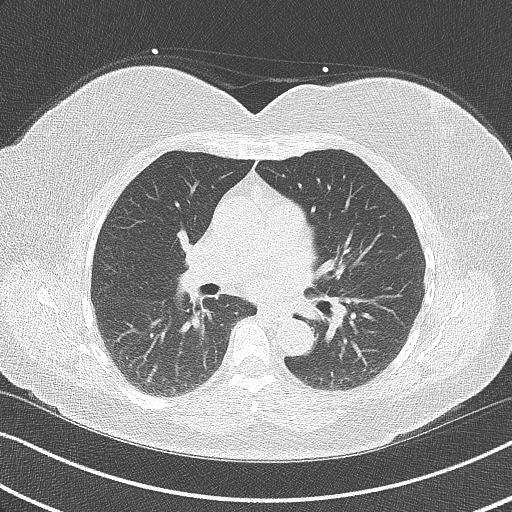
[im 41/50  lung]
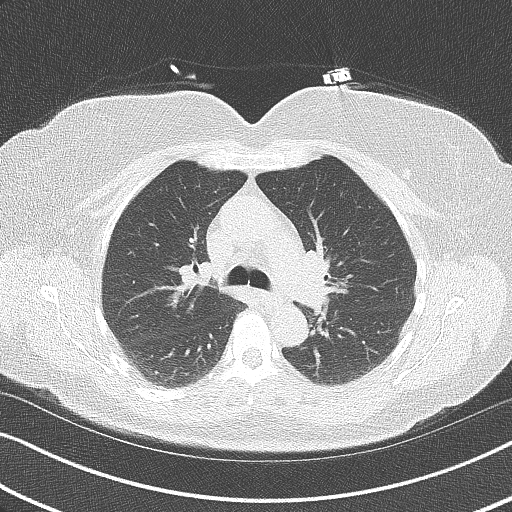

[Series 4: lung st 75 % · axial · 0.68mm/px · z∈[-181,-85]mm · 5 of 50 slices shown]
[im 9/50  lung]
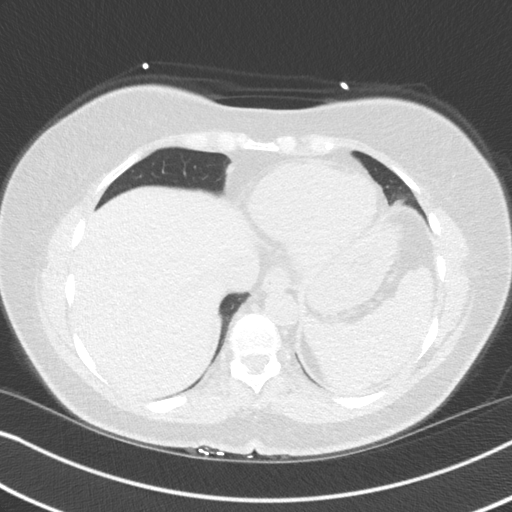
[im 17/50  lung]
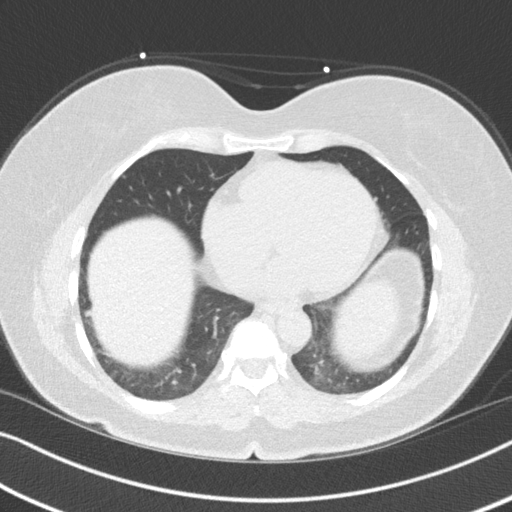
[im 25/50  lung]
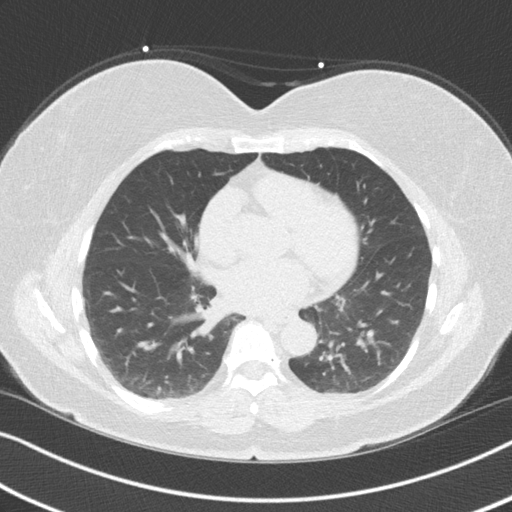
[im 33/50  lung]
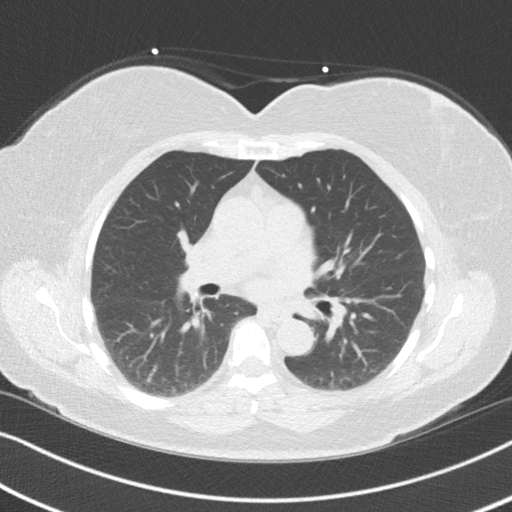
[im 41/50  lung]
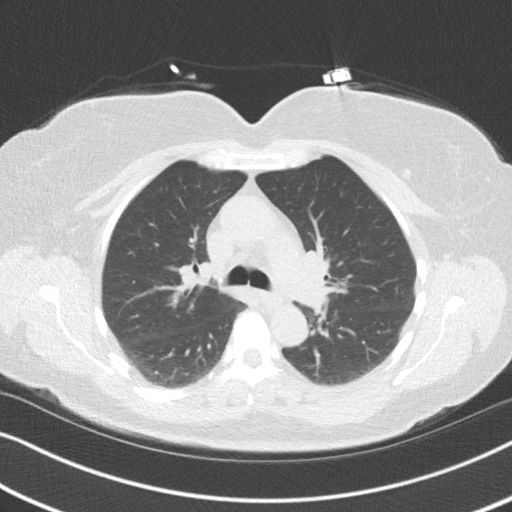

[14 of 20 positions shown; findings below may reference images not displayed]

FINDINGS: Atherosclerotic calcifications in the thoracic aorta. A few
scattered tiny 2-3 mm pulmonary nodules are noted in the lungs
bilaterally, stable compared to prior study from 9997, considered
definitively benign. Within the visualized portions of the thorax
there are no other larger more suspicious appearing pulmonary
nodules or masses, there is no acute consolidative airspace disease,
no pleural effusions, no pneumothorax and no lymphadenopathy.
Visualized portions of the upper abdomen are unremarkable. There are
no aggressive appearing lytic or blastic lesions noted in the
visualized portions of the skeleton.
IMPRESSION: 1.  Aortic Atherosclerosis (GZK8C-GPB.B).
FINDINGS: Coronary Calcium Score:

Left main: 0

Left anterior descending artery: 0

Left circumflex artery: 0

Right coronary artery: 0

Total: 0

Percentile: 0

Pericardium: Normal.

Ascending Aorta: Normal caliber.  Mild aortic atherosclerosis.

Non-cardiac: See separate report from [REDACTED].
IMPRESSION: Coronary calcium score of 0. This was 0 percentile for age-, race-,
and sex-matched controls.

Mild aortic atherosclerosis.



If CAC=0, it is reasonable to withhold statin therapy and reassess
in 5 to 10 years, as long as higher risk conditions are absent
(diabetes mellitus, family history of premature CHD in first degree
relatives (males <55 years; females <65 years), cigarette smoking,
or LDL >=190 mg/dL).

If CAC is 1 to 99, it is reasonable to initiate statin therapy for
patients >=55 years of age.

If CAC is >=100 or >=75th percentile, it is reasonable to initiate
statin therapy at any age.

Cardiology referral should be considered for patients with CAC
scores >=400 or >=75th percentile.

*0990 AHA/ACC/AACVPR/AAPA/ABC/NDAPA/PAULUS N/ORDUNA/Marko Petrovic/MARTHYNA/SANDIP/GALESKI
Guideline on the Management of Blood Cholesterol: A Report of the
American College of Cardiology/American Heart Association Task Force
on Clinical Practice Guidelines. J Am Coll Cardiol.
4633;73(24):4692-4728.

*** End of Addendum ***
EXAM:
OVER-READ INTERPRETATION  CT CHEST

The following report is an over-read performed by radiologist Dr.
Boghaba Zayda [REDACTED] on 10/31/2020. This
over-read does not include interpretation of cardiac or coronary
anatomy or pathology. The coronary calcium score interpretation by
the cardiologist is attached.
FINDINGS: Atherosclerotic calcifications in the thoracic aorta. A few
scattered tiny 2-3 mm pulmonary nodules are noted in the lungs
bilaterally, stable compared to prior study from 9997, considered
definitively benign. Within the visualized portions of the thorax
there are no other larger more suspicious appearing pulmonary
nodules or masses, there is no acute consolidative airspace disease,
no pleural effusions, no pneumothorax and no lymphadenopathy.
Visualized portions of the upper abdomen are unremarkable. There are
no aggressive appearing lytic or blastic lesions noted in the
visualized portions of the skeleton.
IMPRESSION: 1.  Aortic Atherosclerosis (GZK8C-GPB.B).

## 2022-03-08 NOTE — Progress Notes (Unsigned)
[name] [name] Phone: [phone]   Assessment and Plan:    1. Primary osteoarthritis of right knee 2. Chronic right hip pain Chronic with exacerbation, subsequent visit - Patient had moderate relief from intra-articular CSI last performed on 01/01/2022, however has had recurrence of pain over the past 1 to 2 weeks - Since patient received moderate relief from CSI, however did not reach our goal of 13-monthimprovement, we will transition to Zilretta injection for the hope of longer relief.  Injection will be ordered today and patient can follow-up in 1 week for procedure only Zilretta injection -Discussed that we do not want to use prolonged NSAID courses with patient's past medical history.  Also discussed other types of injections such as HA or PRP, and patient agreed to proceed with Zilretta   Pertinent previous records reviewed include none   Follow Up: follow-up in 1 week for procedure only Zilretta injection     Subjective:   I, MPincus Badder am serving as a sEducation administratorfor Doctor [employer]  Chief Complaint: OMT follow up    HPI:  03/23/21 Patient with chronic back/neck pain hoping to avoid surgical intervention.   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 She feels like she is doing better. Meloxicam is helping    05/25/2021 Patient states that she's okay will hopefully be getting a CT scan soon , just here for some maintenance     06/25/2021 Patient states that she's feeling pretty good isn't in as much pain as she  had been having still stiff at times needs her adjustment    07/23/2021 Patient states time for a tune up, neck and shoulder are tight giving her a little bit of a headache    08/13/2021 Patient states that she is doing the neck and shoulder are tight still getting a headache   5/16/023 Patient states she is doing pain is intermittent but over all better    10/02/2021 Patient states time for a tune up    11/01/2021 Patient states that she is a little tight this week    12/04/2021 Patient states the pain isnt as intense , [place]   01/01/2022 Patient states ready for  tune and some    01/17/2022 Patient states the knee is great , hip is better , here for adjustment in the neck and shoulders   02/08/2022 Patient states she is in pain , neck shoulder and lower back    03/11/2022 Patient states that pain in her right knee is back    Relevant Historical Information: Hashimoto's, DDD lumbar spine, history of SVT  Additional pertinent review of systems negative.  Current Outpatient Medications  Medication Sig Dispense Refill   Cholecalciferol (VITAMIN D-3) 125 MCG (5000 UT) TABS Take 5,000 Units by mouth See admin instructions. Monday and Friday 30 tablet    ezetimibe (ZETIA) 10 MG tablet Take 1 tablet (10 mg total) by mouth at bedtime. 90 tablet 3   levothyroxine (SYNTHROID) 75 MCG tablet Take 75 mcg by mouth daily before breakfast.  losartan (COZAAR) 25 MG tablet Take 1 tablet (25 mg total) by mouth daily. 90 tablet 2   metoprolol succinate (TOPROL-XL) 50 MG 24 hr tablet TAKE 1 TABLET BY MOUTH EVERY DAY WITH OR IMMEDIATELY FOLLOWING A MEAL 90 tablet 1   Multiple Vitamin (MULTIVITAMIN WITH MINERALS) TABS tablet Take 1 tablet by mouth daily.     omeprazole (PRILOSEC) 40 MG capsule Take 1 capsule (40 mg total) by mouth daily. Once daily 30 capsule 2   polyethylene glycol (MIRALAX / GLYCOLAX) packet Take 17 g by mouth daily as needed for mild constipation.       Polyvinyl Alcohol-Povidone PF (REFRESH) 1.4-0.6 % SOLN Apply 1 drop to eye 3 (three) times daily as needed (for dry eyes).     pravastatin (PRAVACHOL) 20 MG tablet Take 1 tablet (20 mg total) by mouth every evening. 30 tablet 11   Probiotic Product (PROBIOTIC ADVANCED PO) Take 1 capsule by mouth daily.      RESTASIS 0.05 % ophthalmic emulsion Place 1 drop into both eyes 2 (two) times daily.     White Petrolatum-Mineral Oil (SYSTANE NIGHTTIME) OINT SMARTSIG:1 Inch(es) In Eye(s) Every Night     dicyclomine (BENTYL) 20 MG tablet Take 1 tablet (20 mg total) by mouth 2 (two) times daily as needed for spasms. 30 tablet 1   fluticasone (FLONASE) 50 MCG/ACT nasal spray Place 2 sprays into both nostrils daily. 16 g 0   ibuprofen (ADVIL) 200 MG tablet Take 400-600 mg by mouth daily as needed for moderate pain.     ipratropium (ATROVENT) 0.03 % nasal spray Place 2 sprays into both nostrils 3 (three) times daily as needed for rhinitis. 30 mL 0   Current Facility-Administered Medications  Medication Dose Route Frequency Provider Last Rate Last Admin   0.9 %  sodium chloride infusion  500 mL Intravenous Once Sharyn Creamer, MD          Objective:     Vitals:   03/11/22 1257  BP: 100/80  Pulse: (!) 57  SpO2: 100%  Weight: 163 lb (73.9 kg)  Height: '5\' 1"'$  (1.549 m)      Body mass index is 30.8 kg/m.    Physical Exam:     General:  awake, alert oriented, no acute distress nontoxic Skin: no suspicious lesions or rashes Neuro:sensation intact, no deficits, strength 5/5 with no deficits, no atrophy, normal muscle tone Psych: No signs of anxiety, depression or other mood disorder   Right knee: Mild swelling No deformity Positive fluid wave, joint milking ROM Flex 100, Ext 5 TTP medial femoral condyle, lateral femoral condyle, medial joint line, lateral joint line NTTP over the quad tendon, patella, plica, patella tendon, tibial tuberostiy, fibular head, posterior fossa, pes anserine bursa,  gerdy's tubercle,  Neg anterior and posterior drawer Neg lachman Neg sag sign Negative varus stress Negative valgus stress Negative McMurray   Gait normal    Electronically signed by:  [name] D.[facility] 1:12 PM 03/11/22

## 2022-03-11 ENCOUNTER — Ambulatory Visit: Payer: Medicare HMO | Admitting: Sports Medicine

## 2022-03-11 ENCOUNTER — Telehealth: Payer: Self-pay | Admitting: *Deleted

## 2022-03-11 VITALS — BP 100/80 | HR 57 | Ht 61.0 in | Wt 163.0 lb

## 2022-03-11 DIAGNOSIS — M25551 Pain in right hip: Secondary | ICD-10-CM

## 2022-03-11 DIAGNOSIS — M1711 Unilateral primary osteoarthritis, right knee: Secondary | ICD-10-CM

## 2022-03-11 DIAGNOSIS — G8929 Other chronic pain: Secondary | ICD-10-CM | POA: Diagnosis not present

## 2022-03-11 NOTE — Telephone Encounter (Signed)
R knee Zilretta initiated through portal.

## 2022-03-11 NOTE — Telephone Encounter (Signed)
-----   Message from [facility], LAT sent at 03/11/2022  1:08 PM EST ----- Zilretta right knee

## 2022-03-11 NOTE — Patient Instructions (Addendum)
Good to see you  1 week follow up for zilretta injection

## 2022-03-12 NOTE — Telephone Encounter (Signed)
R knee visco initiated through portals.

## 2022-03-12 NOTE — Telephone Encounter (Signed)
Prior auth initiated.

## 2022-03-13 NOTE — Progress Notes (Signed)
[name] D.[place] Phone: [phone]   Assessment and Plan:     1. Primary osteoarthritis of right knee -Chronic with exacerbation, subsequent visit - Patient presents for procedure only Zilretta injection.  Tolerated well per note below.  Procedure: Knee Joint Injection Side: Right Indication: Flare of osteoarthritis  Risks explained and consent was given verbally. The site was cleaned with alcohol prep. A needle was introduced with an anterio-lateral approach. Injection given using Zilretta 32 mg. This was well tolerated and resulted in symptomatic relief.  Needle was removed, hemostasis achieved, and post injection instructions were explained.   Pt was advised to call or return to clinic if these symptoms worsen or fail to improve as anticipated.     Pertinent previous records reviewed include none   Follow Up: 3 weeks for reevaluation.  Could consider advanced imaging with MRI versus PRP versus HA injection if no improvement or worsening of symptoms   Subjective:   I, [name], am serving as a Education administrator for Doctor [employer]   Chief Complaint: OMT follow up    HPI:  03/23/21 Patient with chronic back/neck pain hoping to avoid surgical intervention.   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 She feels like she is doing better. Meloxicam is helping    05/25/2021 Patient states that she's okay will hopefully be getting a CT scan soon , just here for some maintenance     06/25/2021 Patient states that  she's feeling pretty good isn't in as much pain as she had been having still stiff at times needs her adjustment    07/23/2021 Patient states time for a tune up, neck and shoulder are tight giving her a little bit of a headache    08/13/2021 Patient states that she is doing the neck and shoulder are tight still getting a headache   5/16/023 Patient states she is doing pain is intermittent but over all better    10/02/2021 Patient states time for a tune up    11/01/2021 Patient states that she is a little tight this week    12/04/2021 Patient states the pain isnt as intense , has been in [place]    01/01/2022 Patient states ready for  tune and some    01/17/2022 Patient states the knee is great , hip is better , here for adjustment in the neck and shoulders   02/08/2022 Patient states she is in pain , neck shoulder and lower back    03/11/2022 Patient states that pain in her right knee is back   03/18/2022 Patient states    Relevant Historical Information: Hashimoto's, DDD lumbar spine, history of SVT  Additional pertinent review of systems negative.   Current Outpatient Medications:    Cholecalciferol (VITAMIN D-3) 125 MCG (5000 UT) TABS, Take 5,000 Units by mouth See admin instructions. Monday and Friday, Disp: 30 tablet, Rfl:    dicyclomine (BENTYL) 20 MG tablet, Take 1 tablet (20 mg total) by mouth 2 (two) times daily as needed for spasms., Disp: 30 tablet, Rfl: 1   ezetimibe (ZETIA)  10 MG tablet, Take 1 tablet (10 mg total) by mouth at bedtime., Disp: 90 tablet, Rfl: 3   fluticasone (FLONASE) 50 MCG/ACT nasal spray, Place 2 sprays into both nostrils daily., Disp: 16 g, Rfl: 0   ibuprofen (ADVIL) 200 MG tablet, Take 400-600 mg by mouth daily as needed for moderate pain., Disp: , Rfl:    ipratropium (ATROVENT) 0.03 % nasal spray, Place 2 sprays into both nostrils 3 (three) times daily as needed for rhinitis., Disp: 30 mL, Rfl: 0   levothyroxine (SYNTHROID) 75 MCG tablet,  Take 75 mcg by mouth daily before breakfast., Disp: , Rfl:    losartan (COZAAR) 25 MG tablet, Take 1 tablet (25 mg total) by mouth daily., Disp: 90 tablet, Rfl: 2   metoprolol succinate (TOPROL-XL) 50 MG 24 hr tablet, TAKE 1 TABLET BY MOUTH EVERY DAY WITH OR IMMEDIATELY FOLLOWING A MEAL, Disp: 90 tablet, Rfl: 1   Multiple Vitamin (MULTIVITAMIN WITH MINERALS) TABS tablet, Take 1 tablet by mouth daily., Disp: , Rfl:    omeprazole (PRILOSEC) 40 MG capsule, Take 1 capsule (40 mg total) by mouth daily. Once daily, Disp: 30 capsule, Rfl: 2   polyethylene glycol (MIRALAX / GLYCOLAX) packet, Take 17 g by mouth daily as needed for mild constipation. , Disp: , Rfl:    Polyvinyl Alcohol-Povidone PF (REFRESH) 1.4-0.6 % SOLN, Apply 1 drop to eye 3 (three) times daily as needed (for dry eyes)., Disp: , Rfl:    pravastatin (PRAVACHOL) 20 MG tablet, Take 1 tablet (20 mg total) by mouth every evening., Disp: 30 tablet, Rfl: 11   Probiotic Product (PROBIOTIC ADVANCED PO), Take 1 capsule by mouth daily. , Disp: , Rfl:    RESTASIS 0.05 % ophthalmic emulsion, Place 1 drop into both eyes 2 (two) times daily., Disp: , Rfl:    White Petrolatum-Mineral Oil (SYSTANE NIGHTTIME) OINT, SMARTSIG:1 Inch(es) In Eye(s) Every Night, Disp: , Rfl:   Current Facility-Administered Medications:    0.9 %  sodium chloride infusion, 500 mL, Intravenous, Once, Sharyn Creamer, MD      Electronically signed by:  [name] D.[name] [facility] 1:10 PM 03/18/22

## 2022-03-18 ENCOUNTER — Ambulatory Visit (INDEPENDENT_AMBULATORY_CARE_PROVIDER_SITE_OTHER): Payer: Medicare HMO | Admitting: Sports Medicine

## 2022-03-18 DIAGNOSIS — M1711 Unilateral primary osteoarthritis, right knee: Secondary | ICD-10-CM | POA: Diagnosis not present

## 2022-03-18 MED ORDER — TRIAMCINOLONE ACETONIDE 32 MG IX SRER
32.0000 mg | Freq: Once | INTRA_ARTICULAR | Status: AC
  Administered 2022-03-18: 32 mg via INTRA_ARTICULAR

## 2022-03-18 NOTE — Telephone Encounter (Signed)
R knee Monovisc & R knee Zilretta approved.

## 2022-03-21 ENCOUNTER — Ambulatory Visit: Payer: Medicare HMO | Admitting: Sports Medicine

## 2022-03-21 ENCOUNTER — Ambulatory Visit (INDEPENDENT_AMBULATORY_CARE_PROVIDER_SITE_OTHER): Payer: Medicare HMO

## 2022-03-21 VITALS — BP 120/80 | HR 67 | Ht 61.0 in | Wt 163.0 lb

## 2022-03-21 DIAGNOSIS — M545 Low back pain, unspecified: Secondary | ICD-10-CM | POA: Diagnosis not present

## 2022-03-21 DIAGNOSIS — G8929 Other chronic pain: Secondary | ICD-10-CM

## 2022-03-21 DIAGNOSIS — M5136 Other intervertebral disc degeneration, lumbar region: Secondary | ICD-10-CM

## 2022-03-21 DIAGNOSIS — M5441 Lumbago with sciatica, right side: Secondary | ICD-10-CM | POA: Diagnosis not present

## 2022-03-21 NOTE — Progress Notes (Signed)
[name] D.[place] Phone: [phone]   Assessment and Plan:     1. Chronic bilateral low back pain with right-sided sciatica 2. DDD (degenerative disc disease), lumbar  -Chronic with exacerbation, initial visit - Recurrence of low back pain with radicular symptoms down right leg to right knee, consistent with lumbar etiology based on HPI, physical exam, x-ray - X-ray obtained in clinic.  My interpretation: No acute fracture or vertebral collapse.  Grade 1 anterior listhesis L4 and L5 and L5 on S1.  Facet arthropathy most significant L4-S1 - We have been treating patient's right knee pain with CSI, Zilretta, Tylenol, however pain has not significantly improved with these conservative therapies.  There is possible that patient's symptoms have a lumbar etiology.  We will further evaluate with lumbar MRI  Pertinent previous records reviewed include none   Follow Up: 3 days after MRI to review results and create treatment plan   Subjective:   I, [name], am serving as a Education administrator for Doctor Glennon Mac   Chief Complaint: OMT follow up    HPI:  03/23/21 Patient with chronic back/neck pain hoping to avoid surgical intervention.   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 She feels like she is doing better. Meloxicam is helping    05/25/2021 Patient states that she's okay will hopefully be getting a CT scan soon , just here for some maintenance     06/25/2021 Patient states that she's feeling pretty good isn't in as  much pain as she had been having still stiff at times needs her adjustment    07/23/2021 Patient states time for a tune up, neck and shoulder are tight giving her a little bit of a headache    08/13/2021 Patient states that she is doing the neck and shoulder are tight still getting a headache   5/16/023 Patient states she is doing pain is intermittent but over all better    10/02/2021 Patient states time for a tune up    11/01/2021 Patient states that she is a little tight this week    12/04/2021 Patient states the pain isnt as intense , has been in [place]    01/01/2022 Patient states ready for  tune and some    01/17/2022 Patient states the knee is great , hip is better , here for adjustment in the neck and shoulders   02/08/2022 Patient states she is in pain , neck shoulder and lower back    03/11/2022 Patient states that pain in her right knee is back    03/18/2022 Patient states  03/22/2022 Patient states that her back wont cooperate , when she is sitting or laying she will get pain from her knees up to buttock that feels like a burning , tylenol is not working , no MOI, she doesn't know if its coming from her knees  or from her lower back , she is able to do silver sneaker , but sitting and driving is painful     Relevant Historical Information: Hashimoto's, DDD lumbar spine, history of SVT  Additional  pertinent review of systems negative.   Current Outpatient Medications:    Cholecalciferol (VITAMIN D-3) 125 MCG (5000 UT) TABS, Take 5,000 Units by mouth See admin instructions. Monday and Friday, Disp: 30 tablet, Rfl:    dicyclomine (BENTYL) 20 MG tablet, Take 1 tablet (20 mg total) by mouth 2 (two) times daily as needed for spasms., Disp: 30 tablet, Rfl: 1   ezetimibe (ZETIA) 10 MG tablet, Take 1 tablet (10 mg total) by mouth at bedtime., Disp: 90 tablet, Rfl: 3   fluticasone (FLONASE) 50 MCG/ACT nasal spray, Place 2 sprays into both nostrils daily., Disp: 16 g, Rfl:  0   ibuprofen (ADVIL) 200 MG tablet, Take 400-600 mg by mouth daily as needed for moderate pain., Disp: , Rfl:    ipratropium (ATROVENT) 0.03 % nasal spray, Place 2 sprays into both nostrils 3 (three) times daily as needed for rhinitis., Disp: 30 mL, Rfl: 0   levothyroxine (SYNTHROID) 75 MCG tablet, Take 75 mcg by mouth daily before breakfast., Disp: , Rfl:    losartan (COZAAR) 25 MG tablet, Take 1 tablet (25 mg total) by mouth daily., Disp: 90 tablet, Rfl: 2   metoprolol succinate (TOPROL-XL) 50 MG 24 hr tablet, TAKE 1 TABLET BY MOUTH EVERY DAY WITH OR IMMEDIATELY FOLLOWING A MEAL, Disp: 90 tablet, Rfl: 1   Multiple Vitamin (MULTIVITAMIN WITH MINERALS) TABS tablet, Take 1 tablet by mouth daily., Disp: , Rfl:    omeprazole (PRILOSEC) 40 MG capsule, Take 1 capsule (40 mg total) by mouth daily. Once daily, Disp: 30 capsule, Rfl: 2   polyethylene glycol (MIRALAX / GLYCOLAX) packet, Take 17 g by mouth daily as needed for mild constipation. , Disp: , Rfl:    Polyvinyl Alcohol-Povidone PF (REFRESH) 1.4-0.6 % SOLN, Apply 1 drop to eye 3 (three) times daily as needed (for dry eyes)., Disp: , Rfl:    pravastatin (PRAVACHOL) 20 MG tablet, Take 1 tablet (20 mg total) by mouth every evening., Disp: 30 tablet, Rfl: 11   Probiotic Product (PROBIOTIC ADVANCED PO), Take 1 capsule by mouth daily. , Disp: , Rfl:    RESTASIS 0.05 % ophthalmic emulsion, Place 1 drop into both eyes 2 (two) times daily., Disp: , Rfl:    White Petrolatum-Mineral Oil (SYSTANE NIGHTTIME) OINT, SMARTSIG:1 Inch(es) In Eye(s) Every Night, Disp: , Rfl:   Current Facility-Administered Medications:    0.9 %  sodium chloride infusion, 500 mL, Intravenous, Once, Sharyn Creamer, MD   Objective:     Vitals:   03/21/22 1356  BP: 120/80  Pulse: 67  SpO2: 97%  Weight: 163 lb (73.9 kg)  Height: '5\' 1"'$  (1.549 m)      Body mass index is 30.8 kg/m.    Physical Exam:    Gen: Appears well, nad, nontoxic and pleasant Psych: Alert and oriented,  appropriate mood and affect Neuro: sensation intact, strength is 5/5 in upper and lower extremities, muscle tone wnl Skin: no susupicious lesions or rashes  Back - Normal skin, Spine with normal alignment and no deformity.   No tenderness to vertebral process palpation.   Right lumbar paraspinous muscles are mildly tender and without spasm Mild TTP gluteal musculature Straight leg raise positive on right, negative on left Trendelenberg negative Piriformis Test positive on right, negative on left   Electronically signed by:  [name] D.[facility] 2:30 PM 03/21/22

## 2022-03-21 NOTE — Patient Instructions (Addendum)
Good to see you MRI lumbar  Tylenol [id number] mg 2-3 times a day for pain relief  Recommend heating pads on back  Low back HEP  Follow up 3 days after to discuss results

## 2022-03-22 ENCOUNTER — Encounter: Payer: Self-pay | Admitting: *Deleted

## 2022-03-22 ENCOUNTER — Ambulatory Visit
Admission: RE | Admit: 2022-03-22 | Discharge: 2022-03-22 | Disposition: A | Discharge status: Home / Self Care | Payer: Medicare HMO | Source: Ambulatory Visit | Attending: Emergency Medicine | Admitting: Emergency Medicine

## 2022-03-22 VITALS — BP 125/78 | HR 73 | Temp 98.4°F | Resp 18

## 2022-03-22 DIAGNOSIS — Z113 Encounter for screening for infections with a predominantly sexual mode of transmission: Secondary | ICD-10-CM | POA: Diagnosis present

## 2022-03-22 DIAGNOSIS — R300 Dysuria: Secondary | ICD-10-CM | POA: Insufficient documentation

## 2022-03-22 LAB — POCT URINALYSIS DIP (MANUAL ENTRY)
Bilirubin, UA: NEGATIVE
Glucose, UA: NEGATIVE mg/dL
Ketones, POC UA: NEGATIVE mg/dL
Leukocytes, UA: NEGATIVE
Nitrite, UA: NEGATIVE
Protein Ur, POC: NEGATIVE mg/dL
Spec Grav, UA: 1.015 (ref 1.010–1.025)
Urobilinogen, UA: 0.2 E.U./dL
pH, UA: 6 (ref 5.0–8.0)

## 2022-03-22 NOTE — Discharge Instructions (Signed)
Common causes of urinary tract infections include but are not limited to holding your urine longer than you should, squatting instead of sitting down when urinating, sitting around in wet clothing such as a wet swimsuit or gym clothes too long, not emptying your bladder after having sexual intercourse, wiping from back to front instead of front to back after having a bowel movement.     Less common causes of urinary tract infections include but are not limited to anatomical shifts in the location of your bladder or uterus causing obstruction of passage of urine from your bladder to your urethra where your urine comes out or prolapse of your rectum into your vaginal wall.  These less common causes can be evaluated by gynecologist, a urologist or subspecialist called a uro-gynecologist   The urinalysis that we performed in the clinic today was abnormal.  Urine culture will be performed per our protocol.  The result of the urine culture will be available in the next 3 to 5 days and will be posted to your MyChart account.  If there is an abnormal finding, you will be contacted by phone and advised of further treatment recommendations, if any.  If you you continue to have symptoms if your urine culture is negative, have worsening symptoms while you are waiting for the results of urine culture or you do not have complete resolution of your symptoms after completing treatment as prescribed if it is needed, please return to urgent care for repeat evaluation or follow-up with your primary care provider.  Repeat urinalysis and urine culture may be indicated for further evaluation.  Please be sure that you are staying well-hydrated by consuming 80 to 120 ounces of clear fluids daily.   Thank you for visiting urgent care today.  I appreciate the opportunity to participate in your care.

## 2022-03-22 NOTE — ED Provider Notes (Signed)
[facility]    CSN: [id number] Arrival date & time: 03/22/22  1310    HISTORY   Chief Complaint  Patient presents with   Urinary Frequency    Urinary frequency, urgency, odor for 4 days - Entered by patient   HPI [name] is a pleasant, 71 y.o. female who presents to urgent care today. Patient complains of increased frequency of urination, increased urge to urinate and urine malodor for the past 4 days.  Patient states she not tried thing to alleviate her symptoms.  EMR reviewed, patient has a history of constipation secondary to hypothyroidism and Graves' disease.  Today, patient denies burning with urination, sensation of incomplete emptying, suprapubic pain, perineal pain, incontinence of urine, altered mental status, flank pain, fever, chills, malaise, rigors, significant fatigue, abnormal vaginal discharge, vaginal itching, vaginal irritation, dyspareunia, genital lesion(s), and possible exposure to STD.  Patient states she has been moving her bowels regularly as of late.  Urine dip today reveals a small amount of red blood cells, is otherwise normal.  This result is similar to other urine dips performed this year at other locations, patient was not advised to begin antibiotics at any of those visits.  Patient is also requesting STD screening today.  The history is provided by the patient.   Past Medical History:  Diagnosis Date   Arthritis    Neck   CIN I (cervical intraepithelial neoplasia I) 6378   Complication of anesthesia    delayed SVT:  can occur from a few hours to 3 days after the general anesthesia or conscious sedation as happened with her colonoscopy.    Dysplasia of cervix, low grade (CIN 1)    LEEP cervical conization September 2017 margins negative CIN-1   GERD (gastroesophageal reflux disease)    Hashimoto's thyroiditis    History of carpal tunnel syndrome    Left   History of colon polyps 2010   Benign   Hyperlipidemia     Hypothyroidism    Hashimoto's per pt    Osteopenia 10/2017   T score -1.9 FRAX 8.8% / 0.7%.  Stable from prior DEXA.   SVT (supraventricular tachycardia) (HCC)    Wears glasses    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   Abnormal CT of the abdomen 02/25/2018   Abdominal pain, right upper quadrant 02/25/2018   Abnormal finding on radiology exam 02/25/2018   Allergic rhinitis 02/25/2018   Brash 02/25/2018   CN (constipation) 02/25/2018   LBP (low back pain) 02/25/2018   Combined form of age-related cataract, both eyes 04/07/2017   Posterior vitreous detachment of right eye 04/07/2017   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   CIN I (cervical intraepithelial neoplasia I) 01/02/2016   Preoperative cardiovascular examination 11/10/2015   ASCUS with positive high risk HPV cervical 12/13/2014   Eyelid retraction 12/02/2014   Dry eye syndrome of bilateral lacrimal glands 12/01/2014   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   Other disorders affecting eyelid function 12/01/2014   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   Peripheral visual field defect of both eyes 12/01/2014   Age-related nuclear cataract of both eyes 09/06/2014   Anatomical narrow angle of both eyes 09/06/2014   Hx of Hashimoto thyroiditis 09/06/2014   Keratoconjunctivitis sicca of both eyes not specified as Sjogren's  09/06/2014   Meibomian gland dysfunction (MGD) of upper and lower lids of both eyes 09/06/2014   Abnormal Papanicolaou smear of cervix with positive human papilloma virus (HPV) test 06/27/2014   Hypokalemia 03/04/2014   History of paroxysmal supraventricular tachycardia 02/25/2014   Osteopenia 04/30/2013   Dysphonia 04/05/2013   Laryngopharyngeal reflux 04/05/2013   Hashimoto's thyroiditis  03/29/2013   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   Hypercalcemia 05/25/2012   Hypothyroidism 04/27/2012   Hyperlipidemia 04/27/2012   Paroxysmal supraventricular tachycardia ([place]) 04/27/2012   Tachycardia 01/22/2012   Endometrial polyp 12/04/2010   Fibroid, uterus 11/27/2010   Acquired hypothyroidism 01/17/2010   Other and unspecified hyperlipidemia 02/01/2008   Other specified cardiac dysrhythmias(427.89) 12/07/2007   Past Surgical History:  Procedure Laterality Date   BREAST SURGERY  1976   CYST REMOVED   CARDIAC ELECTROPHYSIOLOGY STUDY AND ABLATION  2001   CESAREAN SECTION  1987   COLONOSCOPY     COLONOSCOPY WITH PROPOFOL N/A 02/12/2019   Procedure: COLONOSCOPY WITH PROPOFOL;  Surgeon: Wonda Horner, MD;  Location: [facility];  Service: Endoscopy;  Laterality: N/A;   LEEP  2017   left thumb surgery and Carpal Tunnel Release  9/14   lower lid surgery   09/2018   dry eye from Hashimoto's   POLYPECTOMY  02/12/2019   Procedure: POLYPECTOMY;  Surgeon: Wonda Horner, MD;  Location: [facility];  Service: Endoscopy;;   OB History     Gravida  1   Para  1   Term  1   Preterm      AB      Living  1      SAB      IAB      Ectopic      Multiple      Live Births             Home Medications    Prior to Admission medications   Medication Sig Start Date End Date Taking? Authorizing Provider  Cholecalciferol (VITAMIN D-3) 125 MCG (5000 UT) TABS Take 5,000 Units by mouth See admin instructions. Monday and Friday 06/01/21   Antonieta Pert, MD  dicyclomine (BENTYL) 20 MG tablet Take 1 tablet (20 mg total) by mouth 2 (two) times daily as needed for spasms. 08/23/21   Sharyn Creamer, MD  ezetimibe (ZETIA) 10 MG tablet Take 1 tablet (10 mg total) by mouth at bedtime. 08/28/21   Freada Bergeron, MD  fluticasone (FLONASE) 50 MCG/ACT nasal spray Place 2 sprays into both nostrils daily. 11/19/20   Scot Jun, FNP  ibuprofen (ADVIL)  200 MG tablet Take 400-600 mg by mouth daily as needed for moderate pain.    [provider]  ipratropium (ATROVENT) 0.03 % nasal spray Place 2 sprays into both nostrils 3 (three) times daily as needed for rhinitis. 11/10/21   Scot Jun, FNP  levothyroxine (SYNTHROID) 75 MCG tablet Take 75 mcg by mouth daily before breakfast.    [provider]  losartan (COZAAR) 25 MG tablet Take 1 tablet (25 mg total) by mouth daily. 02/18/22   Freada Bergeron, MD  metoprolol succinate (TOPROL-XL) 50 MG 24 hr tablet TAKE 1 TABLET BY MOUTH EVERY DAY WITH OR IMMEDIATELY FOLLOWING A MEAL 10/26/21   Camnitz, Ocie Doyne, MD  Multiple Vitamin (MULTIVITAMIN WITH MINERALS) TABS tablet Take 1 tablet by mouth daily.    [provider]  omeprazole (PRILOSEC) 40 MG capsule Take 1 capsule (40 mg total) by  mouth daily. Once daily 10/26/21   Sharyn Creamer, MD  polyethylene glycol [facility] / Floria Raveling) packet Take 17 g by mouth daily as needed for mild constipation.     [provider]  Polyvinyl Alcohol-Povidone PF (REFRESH) 1.4-0.6 % SOLN Apply 1 drop to eye 3 (three) times daily as needed (for dry eyes).    [provider]  pravastatin (PRAVACHOL) 20 MG tablet Take 1 tablet (20 mg total) by mouth every evening. 11/12/21   Freada Bergeron, MD  Probiotic Product (PROBIOTIC ADVANCED PO) Take 1 capsule by mouth daily.     [provider]  RESTASIS 0.05 % ophthalmic emulsion Place 1 drop into both eyes 2 (two) times daily. 05/21/21   [provider]  White Petrolatum-Mineral Oil (SYSTANE NIGHTTIME) OINT SMARTSIG:1 Inch(es) In Eye(s) Every Night 10/02/21   [provider]    Family History Family History  Problem Relation Age of Onset   Hypertension Mother    Heart disease Mother    Mitral valve prolapse Mother    Glaucoma Mother    Arthritis Mother    Von Willebrand disease Mother    Stroke Mother    Rheum arthritis Mother    Neuropathy  Mother    Hypertension Father    Heart disease Father    Colon polyps Father    Glaucoma Father    Arthritis Father    Stroke Father    Rheum arthritis Father    Pulmonary embolism Father    Arthritis Sister    Stroke Brother        TIA   Hypertension Brother    Mitral valve prolapse Brother    Glaucoma Brother    Arthritis Brother    Social History Social History   Tobacco Use   Smoking status: Never   Smokeless tobacco: Never  Vaping Use   Vaping Use: Never used  Substance Use Topics   Alcohol use: Not Currently   Drug use: No   Allergies   Diphenhydramine, Epinephrine, Lipitor [atorvastatin], Praluent [alirocumab], and Repatha [evolocumab]  Review of Systems Review of Systems Pertinent findings revealed after performing a 14 point review of systems has been noted in the history of present illness.  Physical Exam Triage Vital Signs ED Triage Vitals  Enc Vitals Group     BP 02/16/21 0827 (!) 147/82     Pulse Rate 02/16/21 0827 72     Resp 02/16/21 0827 18     Temp 02/16/21 0827 98.3 F (36.8 C)     Temp Source 02/16/21 0827 Oral     SpO2 02/16/21 0827 98 %     Weight --      Height --      Head Circumference --      Peak Flow --      Pain Score 02/16/21 0826 5     Pain Loc --      Pain Edu? --      Excl. in [place]? --   No data found.  Updated Vital Signs LMP 11/26/2000   Physical Exam  Visual Acuity Right Eye Distance:   Left Eye Distance:   Bilateral Distance:    Right Eye Near:   Left Eye Near:    Bilateral Near:     UC Couse / Diagnostics / Procedures:     Radiology No results found.  Procedures Procedures (including critical care time) EKG  Pending results:  Labs Reviewed  POCT URINALYSIS DIP (MANUAL ENTRY)    Medications Ordered  in UC: Medications - No data to display  UC Diagnoses / Final Clinical Impressions(s)   I have reviewed the triage vital signs and the nursing notes.  Pertinent labs & imaging results that were  available during my care of the patient were reviewed by me and considered in my medical decision making (see chart for details).    Final diagnoses:  None    {LMSTDP:27058}  {LMUTIP:27060}  ED Prescriptions   None    PDMP not reviewed this encounter.  Disposition Upon Discharge:  Condition: stable for discharge home  Patient presented with concern for an acute illness with associated systemic symptoms and significant discomfort requiring urgent management. In my opinion, this is a condition that a prudent lay person (someone who possesses an average knowledge of health and medicine) may potentially expect to result in complications if not addressed urgently such as respiratory distress, impairment of bodily function or dysfunction of bodily organs.   As such, the patient has been evaluated and assessed, work-up was performed and treatment was provided in alignment with urgent care protocols and evidence based medicine.  Patient/parent/caregiver has been advised that the patient may require follow up for further testing and/or treatment if the symptoms continue in spite of treatment, as clinically indicated and appropriate.  Routine symptom specific, illness specific and/or disease specific instructions were discussed with the patient and/or caregiver at length.  Prevention strategies for avoiding STD exposure were also discussed.  The patient will follow up with their current PCP if and as advised. If the patient does not currently have a PCP we will assist them in obtaining one.   The patient may need specialty follow up if the symptoms continue, in spite of conservative treatment and management, for further workup, evaluation, consultation and treatment as clinically indicated and appropriate.  Patient/parent/caregiver verbalized understanding and agreement of plan as discussed.  All questions were addressed during visit.  Please see discharge instructions below for further details of  plan.  Discharge Instructions: Discharge Instructions   None     This office note has been dictated using Dragon speech recognition software.  Unfortunately, this method of dictation can sometimes lead to typographical or grammatical errors.  I apologize for your inconvenience in advance if this occurs.  Please do not hesitate to reach out to me if clarification is needed.

## 2022-03-22 NOTE — ED Triage Notes (Signed)
Pt reports burning when urinating, increase urinary frequency and mal odor x 4 days.

## 2022-03-23 ENCOUNTER — Ambulatory Visit
Admission: EM | Admit: 2022-03-23 | Discharge: 2022-03-23 | Disposition: A | Discharge status: Home / Self Care | Payer: Medicare HMO | Attending: Emergency Medicine | Admitting: Emergency Medicine

## 2022-03-23 DIAGNOSIS — R300 Dysuria: Secondary | ICD-10-CM | POA: Insufficient documentation

## 2022-03-24 LAB — URINE CULTURE

## 2022-03-25 ENCOUNTER — Other Ambulatory Visit: Payer: Medicare HMO

## 2022-03-25 LAB — CERVICOVAGINAL ANCILLARY ONLY
Bacterial Vaginitis (gardnerella): NEGATIVE
Candida Glabrata: NEGATIVE
Candida Vaginitis: NEGATIVE
Chlamydia: NEGATIVE
Comment: NEGATIVE
Comment: NEGATIVE
Comment: NEGATIVE
Comment: NEGATIVE
Comment: NEGATIVE
Comment: NORMAL
Neisseria Gonorrhea: NEGATIVE
Trichomonas: NEGATIVE

## 2022-03-26 ENCOUNTER — Ambulatory Visit (INDEPENDENT_AMBULATORY_CARE_PROVIDER_SITE_OTHER): Payer: Medicare HMO

## 2022-03-26 DIAGNOSIS — G8929 Other chronic pain: Secondary | ICD-10-CM

## 2022-03-26 DIAGNOSIS — M5441 Lumbago with sciatica, right side: Secondary | ICD-10-CM

## 2022-03-26 NOTE — Progress Notes (Signed)
Cardiology Office Note:    Date:  03/27/2022   ID:  [name], DOB [birth year], MRN [id number]  PCP:  Cari Caraway, [place]  Cardiologist:  Fransico Him, MD  Advanced Practice Provider:  No care team member to display Electrophysiologist:  Will Meredith Leeds, MD    Referring MD: Cari Caraway, MD    History of Present Illness:    [name] is a 71 y.o. female with a hx of HTN, HLD, paroxysmal SVT s/p ablation, and hypothyroidism who was previously followed by Dr. Radford Potts who presents to for CV follow-up.  The [name] has a long history of paroxysmal SVT. Underwent ablation for SVT at [facility] in 2001.  Since then she has had infrequent episodes of SVT.  Many of her episodes have occurred following surgical procedures after administration of anesthesia.  She has had an echocardiogram on 02/02/13 which showed normal left ventricular function with ejection fraction 55-60% and no significant abnormalities. Cardiac monitor 12/17/2017 showed normal sinus rhythm, sinus bradycardia, sinus tachycardia with some nonsustained atrial tachycardia up to 13 beats and occasional PVCs with PVC burden of 1%.  Heart rate ranged from 42 to 185 bpm. Has been followed by Dr. Curt Bears with last visit in 2020 where she was doing well and was continued on her metoprolol.  During visit on 06/19/20, the [name] was having elevated blood pressures running 130s-170s. Also had episode of SVT in 04/2020. Following our visit on 06/23/20, she called the on-call line due to HR 142. She was instructed to take additional doses of her metop at that time. She was seen in EP clinic on 06/30/20 where she was recommended for possible ablation if symptoms recurred.  During visit on 09/21/20, the [name] had another episode of SVT after a dental procedure.She went to [facility] where HR were mildly elevated at that time. Received IVF and toradol with resolution of  symptoms and was discharged home. Ca score was also obtained on 10/31/20 which was 0.   During visit on 06/07/2021, the [name] was suffering with episodes of mouth dryness, fatigue and numbness and was being worked up for possible rheumatologic condition. She was also having brief runs of SVT at that time. TTE 73/41/93 for systolic murmur showed normal BiV function with no evidence of valve disease  Was last seen in clinic on 09/2021 where she was having muscle cramps. We trialed off the pravastatin to see if this helped.  Today, the [name] states that she overall feels well. No significant palpitations. Leg cramps did not improve off the pravastatin and she has since resumed it. No chest pain, SOB, orthopnea, PND, LE edema. Remains active and participates in Silver Sneakers 5x/week. Continuing to undergo work-up for diffuse joint pain.    Past Medical History:  Diagnosis Date   Arthritis    Neck   CIN I (cervical intraepithelial neoplasia I) 7902   Complication of anesthesia    delayed SVT:  can occur from a few hours to 3 days after the general anesthesia or conscious sedation as happened with her colonoscopy.    Dysplasia of cervix, low grade (CIN 1)    LEEP cervical conization September 2017 margins negative CIN-1   GERD (gastroesophageal reflux disease)    Hashimoto's thyroiditis    History of carpal tunnel syndrome    Left   History of colon polyps 2010   Benign   Hyperlipidemia    Hypothyroidism    Hashimoto's per pt  Osteopenia 10/2017   T score -1.9 FRAX 8.8% / 0.7%.  Stable from prior DEXA.   SVT (supraventricular tachycardia)    Wears glasses     Past Surgical History:  Procedure Laterality Date   BREAST SURGERY  1976   CYST REMOVED   CARDIAC ELECTROPHYSIOLOGY STUDY AND ABLATION  2001   CESAREAN SECTION  1987   COLONOSCOPY     COLONOSCOPY WITH PROPOFOL N/A 02/12/2019   Procedure: COLONOSCOPY WITH PROPOFOL;  Surgeon: Wonda Horner, MD;  Location: [facility] ENDOSCOPY;   Service: Endoscopy;  Laterality: N/A;   LEEP  2017   left thumb surgery and Carpal Tunnel Release  9/14   lower lid surgery   09/2018   dry eye from Hashimoto's   POLYPECTOMY  02/12/2019   Procedure: POLYPECTOMY;  Surgeon: Wonda Horner, MD;  Location: [facility] ENDOSCOPY;  Service: Endoscopy;;    Current Medications: Current Meds  Medication Sig   Cholecalciferol (VITAMIN D-3) 125 MCG (5000 UT) TABS Take 5,000 Units by mouth See admin instructions. Monday and Friday   ezetimibe (ZETIA) 10 MG tablet Take 1 tablet (10 mg total) by mouth at bedtime.   fluticasone (FLONASE) 50 MCG/ACT nasal spray Place 2 sprays into both nostrils daily.   levothyroxine (SYNTHROID) 75 MCG tablet Take 75 mcg by mouth daily before breakfast.   losartan (COZAAR) 25 MG tablet Take 1 tablet (25 mg total) by mouth daily.   metoprolol succinate (TOPROL-XL) 50 MG 24 hr tablet TAKE 1 TABLET BY MOUTH EVERY DAY WITH OR IMMEDIATELY FOLLOWING A MEAL   Multiple Vitamin (MULTIVITAMIN WITH MINERALS) TABS tablet Take 1 tablet by mouth daily.   omeprazole (PRILOSEC) 40 MG capsule Take 1 capsule (40 mg total) by mouth daily. Once daily   ondansetron (ZOFRAN-ODT) 4 MG disintegrating tablet Take 4 mg by mouth as needed.   polyethylene glycol (MIRALAX / GLYCOLAX) packet Take 17 g by mouth daily as needed for mild constipation.    Polyvinyl Alcohol-Povidone PF (REFRESH) 1.4-0.6 % SOLN Apply 1 drop to eye 3 (three) times daily as needed (for dry eyes).   pravastatin (PRAVACHOL) 20 MG tablet Take 1 tablet (20 mg total) by mouth every evening.   Probiotic Product (PROBIOTIC ADVANCED PO) Take 1 capsule by mouth daily.    RESTASIS 0.05 % ophthalmic emulsion Place 1 drop into both eyes 2 (two) times daily.   SYSTANE 0.4-0.3 % GEL ophthalmic gel Place 1 Application into both eyes in the morning and at bedtime.   White Petrolatum-Mineral Oil (SYSTANE NIGHTTIME) OINT SMARTSIG:1 Inch(es) In Eye(s) Every Night     Allergies:   Atorvastatin,  Diphenhydramine, Epinephrine, Praluent [alirocumab], and Repatha [evolocumab]   Social History   Socioeconomic History   Marital status: Widowed    Spouse name: Not on file   Number of children: 1   Years of education: Not on file   Highest education level: Master's degree (e.g., MA, MS, MEng, MEd, MSW, MBA)  Occupational History   Not on file  Tobacco Use   Smoking status: Never   Smokeless tobacco: Never  Vaping Use   Vaping Use: Never used  Substance and Sexual Activity   Alcohol use: Not Currently   Drug use: No   Sexual activity: Not Currently    Partners: Male    Birth control/protection: Post-menopausal    Comment: 71 YEARS OLD, NO MORE THAN 5 PARTNERS ,des neg  Other Topics Concern   Not on file  Social History Narrative   Her daughter lives at  home with her    Right handed   Caffeine: coffee, soda, (about 1 cup/day)   Social Determinants of Health   Financial Resource Strain: Not on file  Food Insecurity: Not on file  Transportation Needs: Not on file  Physical Activity: Not on file  Stress: Not on file  Social Connections: Not on file     Family History: The [name]'s family history includes Arthritis in her brother, father, mother, and sister; Colon polyps in her father; Glaucoma in her brother, father, and mother; Heart disease in her father and mother; Hypertension in her brother, father, and mother; Mitral valve prolapse in her brother and mother; Neuropathy in her mother; Pulmonary embolism in her father; Rheum arthritis in her father and mother; Stroke in her brother, father, and mother; Von Willebrand disease in her mother.  ROS:   Please see the history of present illness.    Review of Systems  Constitutional:  Negative for malaise/fatigue.  HENT:  Negative for hearing loss. Sore throat: Dry Mouth.  Eyes:  Negative for blurred vision.  Respiratory:  Negative for shortness of breath.   Cardiovascular:  Negative for chest pain, palpitations,  orthopnea, claudication, leg swelling and PND.  Gastrointestinal:  Negative for nausea and vomiting.  Genitourinary:  Negative for dysuria.  Musculoskeletal:  Positive for back pain, joint pain and myalgias. Negative for falls and neck pain.  Neurological:  Negative for dizziness and loss of consciousness. Weakness: RLE. Psychiatric/Behavioral:  Negative for substance abuse.     EKGs/Labs/Other Studies Reviewed:    The following studies were reviewed today: TTE July 02, 2021: IMPRESSIONS     1. Left ventricular ejection fraction, by estimation, is 60 to 65%. The  left ventricle has normal function. The left ventricle has no regional  wall motion abnormalities. Left ventricular diastolic parameters were  normal.   2. Right ventricular systolic function is normal. The right ventricular  size is normal. There is normal pulmonary artery systolic pressure. The  estimated right ventricular systolic pressure is 77.8 mmHg.   3. The mitral valve is normal in structure. Trivial mitral valve  regurgitation. No evidence of mitral stenosis.   4. The aortic valve is tricuspid. Aortic valve regurgitation is trivial.  Aortic valve sclerosis is present, with no evidence of aortic valve  stenosis.   5. The inferior vena cava is normal in size with greater than 50%  respiratory variability, suggesting right atrial pressure of 3 mmHg.   FINDINGS  Ca score 10/31/20: FINDINGS: Coronary Calcium Score:   Left main: 0   Left anterior descending artery: 0   Left circumflex artery: 0   Right coronary artery: 0   Total: 0   Percentile: 0   Pericardium: Normal.   Ascending Aorta: Normal caliber.  Mild aortic atherosclerosis.   Non-cardiac: See separate report from [facility].   IMPRESSION: Coronary calcium score of 0. This was 0 percentile for age-, race-, and sex-matched controls.   Mild aortic atherosclerosis.  Cardiac monitor 01/26/18 - personally reviewed Normal sinus rhythm,  sinus bradycardia and sinus tachycardia with average heart rate 73 bpm. Heart rate ranged from 42 to 185 bpm. Occasional PVCs with PVC burden 1%. Nonsustained atrial tachycardia up to 13 beats.  TTE 2014: ------------------------------------------------------------  Left ventricle:  The cavity size was normal. Systolic  function was normal. The estimated ejection fraction was in  the range of 55% to 60%. Wall motion was normal; there were  no regional wall motion abnormalities.   ------------------------------------------------------------  Aortic valve:  Trileaflet; normal thickness leaflets.  Mobility was not restricted.  Doppler:  Transvalvular  velocity was within the normal range. There was no stenosis.   No regurgitation.   ------------------------------------------------------------  Aorta:  Aortic root: The aortic root was normal in size.   ------------------------------------------------------------  Mitral valve:   Structurally normal valve.   Mobility was  not restricted.  Doppler:  Transvalvular velocity was within  the normal range. There was no evidence for stenosis.  Trivial regurgitation.    Peak gradient: 23m Hg (D).   ------------------------------------------------------------  Left atrium:  The atrium was normal in size.   ------------------------------------------------------------  Right ventricle:  The cavity size was normal. Wall thickness  was normal. Systolic function was normal.   ------------------------------------------------------------  Pulmonic valve:    Doppler:  Transvalvular velocity was  within the normal range. There was no evidence for stenosis.     ------------------------------------------------------------  Tricuspid valve:   Structurally normal valve.    Doppler:  Transvalvular velocity was within the normal range.  Trivial  regurgitation.   ------------------------------------------------------------  Pulmonary artery:   The main  pulmonary artery was  normal-sized. Systolic pressure was within the normal range.     ------------------------------------------------------------  Right atrium:  The atrium was normal in size.   ------------------------------------------------------------  Pericardium:  There was no pericardial effusion.   ------------------------------------------------------------  Systemic veins:  Inferior vena cava: The vessel was normal in size.    EKG: NSR with HR 62  Recent Labs: 05/31/2021: TSH 1.008 06/01/2021: BUN 17; Creatinine, Ser 0.93; Magnesium 2.5; Potassium 4.0; Sodium 140 06/25/2021: ALT 21 12/26/2021: Hemoglobin 13.4; Platelets 153.0  Recent Lipid Panel    Component Value Date/Time   CHOL 158 06/25/2021 1035   TRIG 91 06/25/2021 1035   HDL 63 06/25/2021 1035   CHOLHDL 2.5 06/25/2021 1035   CHOLHDL 3.4 12/27/2014 1055   VLDL 26 12/27/2014 1055   LDLCALC 78 06/25/2021 1035     Physical Exam:    VS:  BP 120/82   Pulse 62   Ht '5\' 1"'$  (1.549 m)   Wt 162 lb 9.6 oz (73.8 kg)   LMP 11/26/2000   SpO2 98%   BMI 30.72 kg/m     Wt Readings from Last 3 Encounters:  03/27/22 162 lb 9.6 oz (73.8 kg)  03/21/22 163 lb (73.9 kg)  03/11/22 163 lb (73.9 kg)    GEN:  Well nourished, well developed in no acute distress. Comfortable HEENT: Normal NECK: No JVD; No carotid bruits CARDIAC: RRR, 1/6 systolic murmur. No rubs RESPIRATORY:  Clear to auscultation without rales, wheezing or rhonchi  ABDOMEN: Soft, non-tender, non-distended MUSCULOSKELETAL:  No edema; No deformity  SKIN: Warm and dry NEUROLOGIC:  Alert and oriented x 3 PSYCHIATRIC:  Normal affect   ASSESSMENT:    1. SVT (supraventricular tachycardia)   2. Primary hypertension   3. Arthralgia, unspecified joint   4. Mixed hyperlipidemia   5. Statin myopathy      PLAN:    In order of problems listed above:  #Paroxysmal SVT s/p Ablation: [facility] TTE in 2014 with normal  LVEF, no significant valvular disease. Had several recurrences mainly after surgical procedures and administration of anesthesia. Had recurrence of SVT in 04/2020 and 06/2020. Possible plan for repeat ablation if symptoms continue to recur. -Follow-up with Dr. CCurt Bearsas scheduled -Continue metop '50mg'$  XL -Discussed importance of maintaining hydration and avoiding excessive caffeine  #HTN: Blood pressure well controlled on current regimen  and at goal <130/90. -Continue metop '50mg'$  XL as above -Continue losartan '25mg'$  daily if SBP>140 -Check BMET for monitoring  #HLD: #Statin Intolerance: Unable to tolerate several statins due to myalgias and repatha stopped due to weight gain.  Now doing well on pravastatin '20mg'$  daily and zetia. Ca score 10/2020 0.  -Continue zetia'10mg'$  daily -Continue pravstatin '20mg'$  daily -Ca score 0 -Check lipids -Goal LDL<100   Medication Adjustments/Labs and Tests Ordered: Current medicines are reviewed at length with the [name] today.  Concerns regarding medicines are outlined above.  Orders Placed This Encounter  Procedures   Basic metabolic panel   Magnesium   Lipid Profile   EKG 12-Lead   No orders of the defined types were placed in this encounter.    There are no [name] Instructions on file for this visit.   Signed, Freada Bergeron, MD  03/27/2022 2:15 PM    [facility]

## 2022-03-27 ENCOUNTER — Ambulatory Visit: Payer: Medicare HMO | Attending: Cardiology | Admitting: Cardiology

## 2022-03-27 ENCOUNTER — Other Ambulatory Visit: Payer: Self-pay | Admitting: Cardiology

## 2022-03-27 ENCOUNTER — Encounter: Payer: Self-pay | Admitting: Cardiology

## 2022-03-27 VITALS — BP 120/82 | HR 62 | Ht 61.0 in | Wt 162.6 lb

## 2022-03-27 DIAGNOSIS — E782 Mixed hyperlipidemia: Secondary | ICD-10-CM

## 2022-03-27 DIAGNOSIS — G720 Drug-induced myopathy: Secondary | ICD-10-CM

## 2022-03-27 DIAGNOSIS — I471 Supraventricular tachycardia, unspecified: Secondary | ICD-10-CM

## 2022-03-27 DIAGNOSIS — I10 Essential (primary) hypertension: Secondary | ICD-10-CM | POA: Diagnosis not present

## 2022-03-27 DIAGNOSIS — M2550 Pain in unspecified joint: Secondary | ICD-10-CM | POA: Diagnosis not present

## 2022-03-27 DIAGNOSIS — T466X5D Adverse effect of antihyperlipidemic and antiarteriosclerotic drugs, subsequent encounter: Secondary | ICD-10-CM

## 2022-03-27 NOTE — Patient Instructions (Signed)
Medication Instructions:   Your physician recommends that you continue on your current medications as directed. Please refer to the Current Medication list given to you today.  *If you need a refill on your cardiac medications before your next appointment, please call your pharmacy*   Lab Work:  TOMORROW 03/28/22--BMET, MAGNESIUM LEVEL, AND LIPIDS--PLEASE COME FASTING TO THIS LAB APPOINTMENT  If you have labs (blood work) drawn today and your tests are completely normal, you will receive your results only by: [place] (if you have MyChart) OR A paper copy in the mail If you have any lab test that is abnormal or we need to change your treatment, we will call you to review the results.   Follow-Up: At [facility], you and your health needs are our priority.  As part of our continuing mission to provide you with exceptional heart care, we have created designated Provider Care Teams.  These Care Teams include your primary Cardiologist (physician) and Advanced Practice Providers (APPs -  Physician Assistants and Nurse Practitioners) who all work together to provide you with the care you need, when you need it.  We recommend signing up for the patient portal called "MyChart".  Sign up information is provided on this After Visit Summary.  MyChart is used to connect with patients for Virtual Visits (Telemedicine).  Patients are able to view lab/test results, encounter notes, upcoming appointments, etc.  Non-urgent messages can be sent to your provider as well.   To learn more about what you can do with MyChart, go to [email or web address].    Your next appointment:   6 month(s)  The format for your next appointment:   In Person  Provider:   DR. Johney Frame   Important Information About Sugar

## 2022-03-28 ENCOUNTER — Ambulatory Visit: Payer: Medicare HMO | Attending: Cardiology

## 2022-03-28 DIAGNOSIS — T466X5A Adverse effect of antihyperlipidemic and antiarteriosclerotic drugs, initial encounter: Secondary | ICD-10-CM

## 2022-03-28 DIAGNOSIS — I471 Supraventricular tachycardia, unspecified: Secondary | ICD-10-CM

## 2022-03-28 DIAGNOSIS — E782 Mixed hyperlipidemia: Secondary | ICD-10-CM

## 2022-03-28 DIAGNOSIS — I10 Essential (primary) hypertension: Secondary | ICD-10-CM

## 2022-03-28 DIAGNOSIS — M2550 Pain in unspecified joint: Secondary | ICD-10-CM

## 2022-03-28 LAB — BASIC METABOLIC PANEL
BUN/Creatinine Ratio: 16 (ref 12–28)
BUN: 15 mg/dL (ref 8–27)
CO2: 25 mmol/L (ref 20–29)
Calcium: 10.3 mg/dL (ref 8.7–10.3)
Chloride: 104 mmol/L (ref 96–106)
Creatinine, Ser: 0.93 mg/dL (ref 0.57–1.00)
Glucose: 88 mg/dL (ref 70–99)
Potassium: 4.4 mmol/L (ref 3.5–5.2)
Sodium: 142 mmol/L (ref 134–144)
eGFR: 67 mL/min/{1.73_m2} (ref >59)

## 2022-03-28 LAB — LIPID PANEL
Chol/HDL Ratio: 2.8 ratio (ref 0.0–4.4)
Cholesterol, Total: 189 mg/dL (ref 100–199)
HDL: 68 mg/dL (ref >39)
LDL Chol Calc (NIH): 105 mg/dL — ABNORMAL HIGH (ref 0–99)
Triglycerides: 88 mg/dL (ref 0–149)
VLDL Cholesterol Cal: 16 mg/dL (ref 5–40)

## 2022-03-28 LAB — MAGNESIUM: Magnesium: 2.1 mg/dL (ref 1.6–2.3)

## 2022-03-29 NOTE — Progress Notes (Unsigned)
[name] D.[place] Phone: [phone]   Assessment and Plan:     There are no diagnoses linked to this encounter.  ***   Pertinent previous records reviewed include ***   Follow Up: ***     Subjective:   I, [name], am serving as a Education administrator for Doctor Glennon Mac  Chief Complaint: OMT follow up    HPI:  03/23/21 Patient with chronic back/neck pain hoping to avoid surgical intervention.   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 She feels like she is doing better. Meloxicam is helping    05/25/2021 Patient states that she's okay will hopefully be getting a CT scan soon , just here for some maintenance     06/25/2021 Patient states that she's feeling pretty good isn't in as much pain as she had been having still stiff at times needs her adjustment    07/23/2021 Patient states time for a tune up, neck and shoulder are tight giving her a little bit of a headache    08/13/2021 Patient states that she is doing the neck and shoulder are tight still getting a headache   5/16/023 Patient states she is doing pain is intermittent but over all better    10/02/2021 Patient states time for a tune up    11/01/2021 Patient states that she is a little tight this week    12/04/2021 Patient states the pain isnt as intense , has been in [place]    01/01/2022 Patient states ready for  tune and some    01/17/2022 Patient states the knee is great , hip is better , here for adjustment in the neck and shoulders    02/08/2022 Patient states she is in pain , neck shoulder and lower back    03/11/2022 Patient states that pain in her right knee is back    03/18/2022 Patient states   03/22/2022 Patient states that her back wont cooperate , when she is sitting or laying she will get pain from her knees up to buttock that feels like a burning , tylenol is not working , no MOI, she doesn't know if its coming from her knees  or from her lower back , she is able to do silver sneaker , but sitting and driving is painful    04/01/2022 Patient states   Relevant Historical Information: Hashimoto's, DDD lumbar spine, history of SVT    Additional pertinent review of systems negative.   Current Outpatient Medications:    Cholecalciferol (VITAMIN D-3) 125 MCG (5000 UT) TABS, Take 5,000 Units by mouth See admin instructions. Monday and Friday, Disp: 30 tablet, Rfl:    ezetimibe (ZETIA) 10 MG tablet, Take 1 tablet (10 mg total) by mouth at bedtime., Disp: 90 tablet, Rfl: 3   fluticasone (FLONASE) 50 MCG/ACT nasal spray, Place 2 sprays into both nostrils daily., Disp: 16 g, Rfl: 0   levothyroxine (SYNTHROID) 75 MCG tablet, Take 75 mcg by mouth daily before breakfast., Disp: , Rfl:    losartan (COZAAR) 25 MG tablet, Take 1 tablet (25 mg total)  by mouth daily., Disp: 90 tablet, Rfl: 2   metoprolol succinate (TOPROL-XL) 50 MG 24 hr tablet, TAKE 1 TABLET BY MOUTH EVERY DAY WITH OR IMMEDIATELY FOLLOWING A MEAL, Disp: 90 tablet, Rfl: 1   Multiple Vitamin (MULTIVITAMIN WITH MINERALS) TABS tablet, Take 1 tablet by mouth daily., Disp: , Rfl:    omeprazole (PRILOSEC) 40 MG capsule, Take 1 capsule (40 mg total) by mouth daily. Once daily, Disp: 30 capsule, Rfl: 2   ondansetron (ZOFRAN-ODT) 4 MG disintegrating tablet, Take 4 mg by mouth as needed., Disp: , Rfl:    polyethylene glycol (MIRALAX / GLYCOLAX) packet, Take 17 g by mouth daily as needed for mild constipation. , Disp: , Rfl:    Polyvinyl Alcohol-Povidone PF  (REFRESH) 1.4-0.6 % SOLN, Apply 1 drop to eye 3 (three) times daily as needed (for dry eyes)., Disp: , Rfl:    pravastatin (PRAVACHOL) 20 MG tablet, Take 1 tablet (20 mg total) by mouth every evening., Disp: 30 tablet, Rfl: 11   Probiotic Product (PROBIOTIC ADVANCED PO), Take 1 capsule by mouth daily. , Disp: , Rfl:    RESTASIS 0.05 % ophthalmic emulsion, Place 1 drop into both eyes 2 (two) times daily., Disp: , Rfl:    SYSTANE 0.4-0.3 % GEL ophthalmic gel, Place 1 Application into both eyes in the morning and at bedtime., Disp: , Rfl:    White Petrolatum-Mineral Oil (SYSTANE NIGHTTIME) OINT, SMARTSIG:1 Inch(es) In Eye(s) Every Night, Disp: , Rfl:    Objective:     There were no vitals filed for this visit.    There is no height or weight on file to calculate BMI.    Physical Exam:    ***   Electronically signed by:  [name] D.[facility] 7:44 AM 03/29/22

## 2022-04-01 ENCOUNTER — Ambulatory Visit: Payer: Medicare HMO | Admitting: Sports Medicine

## 2022-04-01 VITALS — BP 132/84 | HR 85 | Ht 61.0 in | Wt 162.0 lb

## 2022-04-01 DIAGNOSIS — G8929 Other chronic pain: Secondary | ICD-10-CM | POA: Diagnosis not present

## 2022-04-01 DIAGNOSIS — M5136 Other intervertebral disc degeneration, lumbar region: Secondary | ICD-10-CM | POA: Diagnosis not present

## 2022-04-01 DIAGNOSIS — M48062 Spinal stenosis, lumbar region with neurogenic claudication: Secondary | ICD-10-CM | POA: Diagnosis not present

## 2022-04-01 DIAGNOSIS — M5441 Lumbago with sciatica, right side: Secondary | ICD-10-CM

## 2022-04-01 NOTE — Patient Instructions (Addendum)
Good to see you Epidural right L5-S1 Follow up 2 week after to discuss results

## 2022-04-05 NOTE — Progress Notes (Signed)
[name] D.[place] Phone: [phone]   Assessment and Plan:     There are no diagnoses linked to this encounter.  ***   Pertinent previous records reviewed include ***   Follow Up: ***     Subjective:   I, Prospero Mahnke, am serving as a Education administrator for Doctor Glennon Mac  Chief Complaint: OMT follow up    HPI:  03/23/21 Patient with chronic back/neck pain hoping to avoid surgical intervention.   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 She feels like she is doing better. Meloxicam is helping    05/25/2021 Patient states that she's okay will hopefully be getting a CT scan soon , just here for some maintenance     06/25/2021 Patient states that she's feeling pretty good isn't in as much pain as she had been having still stiff at times needs her adjustment    07/23/2021 Patient states time for a tune up, neck and shoulder are tight giving her a little bit of a headache    08/13/2021 Patient states that she is doing the neck and shoulder are tight still getting a headache   5/16/023 Patient states she is doing pain is intermittent but over all better    10/02/2021 Patient states time for a tune up    11/01/2021 Patient states that she is a little tight this week    12/04/2021 Patient states the pain isnt as intense , has been in [place]    01/01/2022 Patient states ready for  tune and some    01/17/2022 Patient states the knee is great , hip is better , here for adjustment in the neck and shoulders    02/08/2022 Patient states she is in pain , neck shoulder and lower back    03/11/2022 Patient states that pain in her right knee is back    03/18/2022 Patient states   03/22/2022 Patient states that her back wont cooperate , when she is sitting or laying she will get pain from her knees up to buttock that feels like a burning , tylenol is not working , no MOI, she doesn't know if its coming from her knees  or from her lower back , she is able to do silver sneaker , but sitting and driving is painful    04/01/2022 Patient states she's doing had MRI    04/08/2022 Patient states    Relevant Historical Information: Hashimoto's, DDD lumbar spine, history of SVT  Additional pertinent review of systems negative.   Current Outpatient Medications:    Cholecalciferol (VITAMIN D-3) 125 MCG (5000 UT) TABS, Take 5,000 Units by mouth See admin instructions. Monday and Friday, Disp: 30 tablet, Rfl:    ezetimibe (ZETIA) 10 MG tablet, Take 1 tablet (10 mg total) by mouth at bedtime., Disp: 90 tablet, Rfl: 3   fluticasone (FLONASE) 50 MCG/ACT nasal spray, Place 2 sprays into both nostrils daily., Disp: 16 g, Rfl: 0   levothyroxine (SYNTHROID) 75 MCG tablet, Take 75 mcg by mouth daily before breakfast., Disp: , Rfl:    losartan (COZAAR)  25 MG tablet, Take 1 tablet (25 mg total) by mouth daily., Disp: 90 tablet, Rfl: 2   metoprolol succinate (TOPROL-XL) 50 MG 24 hr tablet, TAKE 1 TABLET BY MOUTH EVERY DAY WITH OR IMMEDIATELY FOLLOWING A MEAL, Disp: 90 tablet, Rfl: 1   Multiple Vitamin (MULTIVITAMIN WITH MINERALS) TABS tablet, Take 1 tablet by mouth daily., Disp: , Rfl:    omeprazole (PRILOSEC) 40 MG capsule, Take 1 capsule (40 mg total) by mouth daily. Once daily, Disp: 30 capsule, Rfl: 2   ondansetron (ZOFRAN-ODT) 4 MG disintegrating tablet, Take 4 mg by mouth as needed., Disp: , Rfl:    polyethylene glycol (MIRALAX / GLYCOLAX) packet, Take 17 g by mouth daily as needed for mild constipation. , Disp:  , Rfl:    Polyvinyl Alcohol-Povidone PF (REFRESH) 1.4-0.6 % SOLN, Apply 1 drop to eye 3 (three) times daily as needed (for dry eyes)., Disp: , Rfl:    pravastatin (PRAVACHOL) 20 MG tablet, Take 1 tablet (20 mg total) by mouth every evening., Disp: 30 tablet, Rfl: 11   Probiotic Product (PROBIOTIC ADVANCED PO), Take 1 capsule by mouth daily. , Disp: , Rfl:    RESTASIS 0.05 % ophthalmic emulsion, Place 1 drop into both eyes 2 (two) times daily., Disp: , Rfl:    SYSTANE 0.4-0.3 % GEL ophthalmic gel, Place 1 Application into both eyes in the morning and at bedtime., Disp: , Rfl:    White Petrolatum-Mineral Oil (SYSTANE NIGHTTIME) OINT, SMARTSIG:1 Inch(es) In Eye(s) Every Night, Disp: , Rfl:    Objective:     There were no vitals filed for this visit.    There is no height or weight on file to calculate BMI.    Physical Exam:    ***   Electronically signed by:  [name] D.[facility] 7:36 AM 04/05/22

## 2022-04-08 ENCOUNTER — Ambulatory Visit: Payer: Medicare HMO | Admitting: Sports Medicine

## 2022-04-08 VITALS — HR 74 | Ht 61.0 in | Wt 164.0 lb

## 2022-04-08 DIAGNOSIS — M5136 Other intervertebral disc degeneration, lumbar region: Secondary | ICD-10-CM

## 2022-04-08 DIAGNOSIS — M9905 Segmental and somatic dysfunction of pelvic region: Secondary | ICD-10-CM

## 2022-04-08 DIAGNOSIS — M5441 Lumbago with sciatica, right side: Secondary | ICD-10-CM

## 2022-04-08 DIAGNOSIS — M9902 Segmental and somatic dysfunction of thoracic region: Secondary | ICD-10-CM

## 2022-04-08 DIAGNOSIS — M9901 Segmental and somatic dysfunction of cervical region: Secondary | ICD-10-CM | POA: Diagnosis not present

## 2022-04-08 DIAGNOSIS — M9908 Segmental and somatic dysfunction of rib cage: Secondary | ICD-10-CM

## 2022-04-08 DIAGNOSIS — M9903 Segmental and somatic dysfunction of lumbar region: Secondary | ICD-10-CM

## 2022-04-08 DIAGNOSIS — G8929 Other chronic pain: Secondary | ICD-10-CM | POA: Diagnosis not present

## 2022-04-08 DIAGNOSIS — M542 Cervicalgia: Secondary | ICD-10-CM | POA: Diagnosis not present

## 2022-04-08 NOTE — Patient Instructions (Signed)
[place]

## 2022-04-19 ENCOUNTER — Inpatient Hospital Stay: Admission: RE | Admit: 2022-04-19 | Payer: Medicare HMO | Source: Ambulatory Visit

## 2022-04-25 ENCOUNTER — Encounter: Payer: Self-pay | Admitting: Cardiology

## 2022-04-25 NOTE — Discharge Instructions (Signed)

## 2022-04-26 ENCOUNTER — Ambulatory Visit
Admission: RE | Admit: 2022-04-26 | Discharge: 2022-04-26 | Disposition: A | Discharge status: Home / Self Care | Payer: Medicare HMO | Source: Ambulatory Visit | Attending: Sports Medicine | Admitting: Sports Medicine

## 2022-04-26 DIAGNOSIS — G8929 Other chronic pain: Secondary | ICD-10-CM

## 2022-04-26 DIAGNOSIS — M5136 Other intervertebral disc degeneration, lumbar region: Secondary | ICD-10-CM

## 2022-04-26 DIAGNOSIS — M48062 Spinal stenosis, lumbar region with neurogenic claudication: Secondary | ICD-10-CM

## 2022-04-26 DIAGNOSIS — M51369 Other intervertebral disc degeneration, lumbar region without mention of lumbar back pain or lower extremity pain: Secondary | ICD-10-CM

## 2022-04-26 MED ORDER — IOPAMIDOL (ISOVUE-M 200) INJECTION 41%
1.0000 mL | Freq: Once | INTRAMUSCULAR | Status: AC
  Administered 2022-04-26: 1 mL via EPIDURAL

## 2022-04-26 MED ORDER — METHYLPREDNISOLONE ACETATE 40 MG/ML INJ SUSP (RADIOLOG
80.0000 mg | Freq: Once | INTRAMUSCULAR | Status: AC
  Administered 2022-04-26: 80 mg via EPIDURAL

## 2022-04-29 ENCOUNTER — Ambulatory Visit: Payer: Medicare HMO | Admitting: Internal Medicine

## 2022-04-29 ENCOUNTER — Encounter: Payer: Self-pay | Admitting: Internal Medicine

## 2022-04-29 DIAGNOSIS — K5900 Constipation, unspecified: Secondary | ICD-10-CM

## 2022-04-29 DIAGNOSIS — R1011 Right upper quadrant pain: Secondary | ICD-10-CM | POA: Diagnosis not present

## 2022-04-29 DIAGNOSIS — K219 Gastro-esophageal reflux disease without esophagitis: Secondary | ICD-10-CM

## 2022-04-29 DIAGNOSIS — K259 Gastric ulcer, unspecified as acute or chronic, without hemorrhage or perforation: Secondary | ICD-10-CM

## 2022-04-29 DIAGNOSIS — D135 Benign neoplasm of extrahepatic bile ducts: Secondary | ICD-10-CM | POA: Diagnosis not present

## 2022-04-29 MED ORDER — DICYCLOMINE HCL 10 MG PO CAPS: 10.0000 mg | ORAL_CAPSULE | Freq: Three times a day (TID) | ORAL | 1 refills | Status: DC | PRN

## 2022-04-29 MED ORDER — OMEPRAZOLE 40 MG PO CPDR: 40.0000 mg | DELAYED_RELEASE_CAPSULE | Freq: Every day | ORAL | 11 refills | Status: AC

## 2022-04-29 NOTE — Progress Notes (Signed)
Chief Complaint: Abdominal discomfort  HPI : 72 year old female with history of SVT, hypothyroidism presents for follow up of abdominal discomfort  Interval History: She has bloating at times. This is not excessively bothersome. She has ab pain on occasion in the RUQ, which is unchanged in quality. Denies N&V, chest burning, or regurgitation. Denies weight loss. She uses Miralax PRN. She does probiotics, vegetables, and water. On this regimen she has been having 1 BM per day.   Current Outpatient Medications  Medication Sig Dispense Refill   Cholecalciferol (VITAMIN D-3) 125 MCG (5000 UT) TABS Take 5,000 Units by mouth See admin instructions. Monday and Friday 30 tablet    dicyclomine (BENTYL) 10 MG capsule Take 1 capsule (10 mg total) by mouth 3 (three) times daily as needed for spasms (abdominal pain). 90 capsule 1   ezetimibe (ZETIA) 10 MG tablet Take 1 tablet (10 mg total) by mouth at bedtime. 90 tablet 3   fluticasone (FLONASE) 50 MCG/ACT nasal spray Place 2 sprays into both nostrils daily. 16 g 0   levothyroxine (SYNTHROID) 75 MCG tablet Take 75 mcg by mouth daily before breakfast.     losartan (COZAAR) 25 MG tablet Take 1 tablet (25 mg total) by mouth daily. 90 tablet 2   metoprolol succinate (TOPROL-XL) 50 MG 24 hr tablet TAKE 1 TABLET BY MOUTH EVERY DAY WITH OR IMMEDIATELY FOLLOWING A MEAL 90 tablet 1   Multiple Vitamin (MULTIVITAMIN WITH MINERALS) TABS tablet Take 1 tablet by mouth daily.     ondansetron (ZOFRAN-ODT) 4 MG disintegrating tablet Take 4 mg by mouth as needed.     polyethylene glycol (MIRALAX / GLYCOLAX) packet Take 17 g by mouth daily as needed for mild constipation.      Polyvinyl Alcohol-Povidone PF (REFRESH) 1.4-0.6 % SOLN Apply 1 drop to eye 3 (three) times daily as needed (for dry eyes).     pravastatin (PRAVACHOL) 20 MG tablet Take 1 tablet (20 mg total) by mouth every evening. 30 tablet 11   Probiotic Product (PROBIOTIC ADVANCED PO) Take 1 capsule by mouth  daily.      RESTASIS 0.05 % ophthalmic emulsion Place 1 drop into both eyes 2 (two) times daily.     SYSTANE 0.4-0.3 % GEL ophthalmic gel Place 1 Application into both eyes in the morning and at bedtime.     White Petrolatum-Mineral Oil (SYSTANE NIGHTTIME) OINT SMARTSIG:1 Inch(es) In Eye(s) Every Night     omeprazole (PRILOSEC) 40 MG capsule Take 1 capsule (40 mg total) by mouth daily. 30 capsule 11   No current facility-administered medications for this visit.  Review of Systems: All systems reviewed and negative except where noted in HPI.   Physical Exam: BP 118/84   Pulse 81   Ht '5\' 3"'$  (1.6 m)   Wt 166 lb 4 oz (75.4 kg)   LMP 11/26/2000   BMI 29.45 kg/m  Constitutional: Pleasant,well-developed, female in no acute distress. HEENT: Normocephalic and atraumatic. Conjunctivae are normal. No scleral icterus. Cardiovascular: Normal rate, regular rhythm.  Pulmonary/chest: Effort normal and breath sounds normal. No wheezing, rales or rhonchi. Abdominal: Soft, nondistended, non-tender. Bowel sounds active throughout. There are no masses palpable. No hepatomegaly. Extremities: No edema Neurological: Alert and oriented to person place and time. Skin: Skin is warm and dry. No rashes noted. Psychiatric: Normal mood and affect. Behavior is normal.  Labs 06/01/21: CBC unremarkable. CMP nml. TSH nml. Lipase nml.  CT A/P 05/27/21: IMPRESSION: 1. No acute findings within the abdomen or pelvis.  No bowel obstruction or evidence of bowel wall inflammation. No evidence of acute solid organ abnormality. No renal or ureteral calculi. 2. Leiomyomatous uterus. 3. Degenerative spondylosis within the lower lumbar spine, at least moderate in degree at the lumbosacral junction.  RUQ U/S 06/11/21: FINDINGS: Gallbladder: No gallstones or wall thickening visualized. No sonographic Murphy sign noted by sonographer. Tiny focus of adenomyomatosis at the gallbladder fundus. Common bile duct: Diameter: 2  mm Liver: The liver demonstrates normal echogenicity. Two small cysts measure up to 1 cm in the right lobe of the liver. There is a 1.4 x 0.8 x 1.2 cm focus of subcapsular calcification in the right lobe, also seen on the prior CT. Portal vein is patent on color Doppler imaging with normal direction of blood flow towards the liver. Other: None. IMPRESSION: 1. No gallstone or sonographic evidence of acute cholecystitis. 2. Small liver cysts. 3. Small focus of subcapsular calcification in the right lobe of the liver  Colonoscopy 02/12/19: - One 8 mm polyp in the cecum, removed with a hot snare. Resected and retrieved. - Internal hemorrhoids. Path: A. COLON, CECUM POLYP, BIOPSY:  - Tubular adenoma.  - No high-grade dysplasia or carcinoma.   EGD 09/14/21: - Esophageal mucosal variant due to an inlet patch. - White nummular lesions in esophageal mucosa. Biopsied. - Erosive gastropathy with no bleeding and no stigmata of recent bleeding. Biopsied. - Normal examined duodenum. Biopsied. Path: 1. Surgical [P], duodenal biopsies - DUODENAL MUCOSA WITH NORMAL VILLOUS ARCHITECTURE. - NO VILLOUS ATROPHY OR INCREASED INTRAEPITHELIAL LYMPHOCYTES. 2. Surgical [P], gastric biopsies - UNREMARKABLE ANTRAL AND OXYNTIC MUCOSA. - NO HELICOBACTER PYLORI IDENTIFIED. 3. Surgical [P], esophageal biopsies - BENIGN SQUAMOUS MUCOSA. - NO EOSINOPHILIC ESOPHAGITIS. - PAS stain is performed and no fungi are identified.  ASSESSMENT AND PLAN:  Indigestion GERD Constipation Gallbladder adenomyomatosis Patient is for the most part doing very well. She has occasional ab pain and bloating. She is on daily PPI therapy. Will have try Bentyl to use PRN to help with her ab discomfort. If this is not effective, could try some PRN Pepcid or Mylanta since patient has responded to PPI therapy in the past. As we have discussed previously, will plan to follow-up her gallbladder adenomyomatosis next month. - Gas-X PRN -  Bentyl 10 mg TID PRN for ab pain - Cont omeprazole 40 mg QD. Refill - Repeat RUQ U/S in 05/2022 to follow up gallbladder adenomyomatosis - Repeat colonoscopy in 01/2026 based upon prior colonoscopy - RTC 1 year  Christia Reading, MD  I spent 31 minutes of time, including in depth chart review, independent review of results as outlined above, communicating results with the patient directly, face-to-face time with the patient, coordinating care, and ordering studies and medications as appropriate, and documentation.

## 2022-04-29 NOTE — Patient Instructions (Signed)
_______________________________________________________  If you are age 71 or older, your body mass index should be between 23-30. Your Body mass index is 29.45 kg/m. If this is out of the aforementioned range listed, please consider follow up with your Primary Care Provider. ________________________________________________________  The [facility] providers would like to encourage you to use [facility] to communicate with providers for non-urgent requests or questions.  Due to long hold times on the telephone, sending your provider a message by [facility] may be a faster and more efficient way to get a response.  Please allow 48 business hours for a response.  Please remember that this is for non-urgent requests.  _______________________________________________________  We have sent the following medications to your pharmacy for you to pick up at your convenience:  START: Bentyl '10mg'$  one tablet three times daily as needed for abdominal pain. CONTINUE: Omeprazole  You have been scheduled for an abdominal ultrasound at [facility] (1st floor of hospital) on 05-27-22 at 10:00am. Please arrive 30 minutes prior to your appointment for registration. Make certain not to have anything to eat or drink 8 hours prior to your appointment. Should you need to reschedule your appointment, please contact radiology at [phone]. This test typically takes about 30 minutes to perform.  Due to recent changes in healthcare laws, you may see the results of your imaging and laboratory studies on MyChart before your provider has had a chance to review them.  We understand that in some cases there may be results that are confusing or concerning to you. Not all laboratory results come back in the same time frame and the provider may be waiting for multiple results in order to interpret others.  Please give [place] 48 hours in order for your provider to thoroughly review all the results before contacting the office for  clarification of your results.   You will need a follow up appointment in 1 year.    Thank you for entrusting me with your care and choosing [facility].  Dr Lorenso Courier

## 2022-05-06 ENCOUNTER — Encounter: Payer: Self-pay | Admitting: Internal Medicine

## 2022-05-07 ENCOUNTER — Ambulatory Visit: Payer: Medicare HMO | Admitting: Obstetrics & Gynecology

## 2022-05-07 ENCOUNTER — Encounter: Payer: Self-pay | Admitting: Obstetrics & Gynecology

## 2022-05-07 VITALS — BP 124/82 | HR 61 | Temp 98.1°F

## 2022-05-07 DIAGNOSIS — R8290 Unspecified abnormal findings in urine: Secondary | ICD-10-CM | POA: Diagnosis not present

## 2022-05-07 DIAGNOSIS — N760 Acute vaginitis: Secondary | ICD-10-CM

## 2022-05-07 DIAGNOSIS — R87610 Atypical squamous cells of undetermined significance on cytologic smear of cervix (ASC-US): Secondary | ICD-10-CM

## 2022-05-07 DIAGNOSIS — N898 Other specified noninflammatory disorders of vagina: Secondary | ICD-10-CM

## 2022-05-07 DIAGNOSIS — N870 Mild cervical dysplasia: Secondary | ICD-10-CM

## 2022-05-07 LAB — WET PREP FOR TRICH, YEAST, CLUE

## 2022-05-07 MED ORDER — TINIDAZOLE 500 MG PO TABS: 1000.0000 mg | ORAL_TABLET | Freq: Two times a day (BID) | ORAL | 0 refills | Status: AC

## 2022-05-07 NOTE — Progress Notes (Signed)
    [name] [birth year] [id number]        72 y.o.  G1P1001   RP: Vaginal or urine odor  HPI: Vaginal or urine odor.  Odor on-off x last summer.  No pelvic pain.  No PMB.  Abstinent.   OB History  Gravida Para Term Preterm AB Living  '1 1 1     1  '$ SAB IAB Ectopic Multiple Live Births               # Outcome Date GA Lbr Len/2nd Weight Sex Delivery Anes PTL Lv  1 Term             Past medical history,surgical history, problem list, medications, allergies, family history and social history were all reviewed and documented in the EPIC chart.   Directed ROS with pertinent positives and negatives documented in the history of present illness/assessment and plan.  Exam:  Vitals:   05/07/22 1019  BP: 124/82  Pulse: 61  Temp: 98.1 F (36.7 C)  TempSrc: Oral  SpO2: 98%   General appearance:  Normal  Abdomen: normal  Gynecologic exam: Vulva normal. Speculum:  Cervix/vagina normal.  Vaginal discharge present.  No blood.  Wet prep done.  U/A completely negative Wet prep: Many bacteria with moderate WBC   Assessment/Plan:  72 y.o. G1P1001   1. Abnormal urine odor U/A completely negative, reassured. - Urinalysis,Complete w/RFL Culture  2. Vaginal odor Given the Wet prep results with patient having an odor with increased discharge on vaginal exam and abstinence since last STI screen, decision to treat for BV with Tinidazole.  Usage reviewed and prescription sent to pharmacy.  - WET PREP FOR [place], YEAST, CLUE  Other orders - prednisoLONE acetate (PRED FORTE) 1 % ophthalmic suspension; Place 1 drop into both eyes 2 times daily. - tretinoin (RETIN-A) 0.05 % cream; Apply topically at bedtime. - Urine Culture - REFLEXIVE URINE CULTURE - tinidazole (TINDAMAX) 500 MG tablet; Take 2 tablets (1,000 mg total) by mouth 2 (two) times daily for 2 days.   Princess Bruins MD, 10:37 AM 05/07/2022

## 2022-05-08 LAB — URINE CULTURE
MICRO NUMBER:: 14434639
SPECIMEN QUALITY:: ADEQUATE

## 2022-05-08 LAB — URINALYSIS, COMPLETE W/RFL CULTURE
Bacteria, UA: NONE SEEN /HPF
Bilirubin Urine: NEGATIVE
Glucose, UA: NEGATIVE
Hyaline Cast: NONE SEEN /LPF
Ketones, ur: NEGATIVE
Leukocyte Esterase: NEGATIVE
Nitrites, Initial: NEGATIVE
Protein, ur: NEGATIVE
Specific Gravity, Urine: 1.025 (ref 1.001–1.035)
pH: 6 (ref 5.0–8.0)

## 2022-05-08 LAB — CULTURE INDICATED

## 2022-05-09 NOTE — Progress Notes (Signed)
[name] D.[place] Phone: [phone]   Assessment and Plan:     1. Chronic bilateral low back pain with right-sided sciatica 2. DDD (degenerative disc disease), lumbar 3. Neck pain 4. Somatic dysfunction of cervical region 5. Somatic dysfunction of thoracic region 6. Somatic dysfunction of lumbar region 7. Somatic dysfunction of pelvic region 8. Somatic dysfunction of rib region  -Chronic with exacerbation, subsequent sports medicine visit - Overall improvement in low back pain after epidural CSI performed on 04/26/2022 with patient still experiencing benefit from this injection - Patient's multiple musculoskeletal complaints have overall been improving with frequent OMT visits.  Patient does have a flare of upper back and neck pain at today's visit - Patient has received significant relief with OMT in the past.  Elects for repeat OMT today.  Tolerated well per note below. - Decision today to treat with OMT was based on Physical Exam   After verbal consent patient was treated with HVLA (high velocity low amplitude), ME (muscle energy), FPR (flex positional release), ST (soft tissue), PC/PD (Pelvic Compression/ Pelvic Decompression) techniques in cervical, rib, thoracic, lumbar, and pelvic areas. Patient tolerated the procedure well with improvement in symptoms.  Patient educated on potential side effects of soreness and recommended to rest, hydrate, and use Tylenol as needed for pain control.   Pertinent previous records reviewed include epidural procedure note 04/26/2022   Follow Up: 4 weeks for reevaluation.  Could consider repeat OMT   Subjective:   I, [name], am serving as a Education administrator for Doctor Glennon Mac  Chief Complaint: OMT follow up    HPI:  03/23/21 Patient with chronic back/neck pain hoping to avoid surgical intervention.   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 She feels like she is doing better. Meloxicam is helping    05/25/2021 Patient states that she's okay will hopefully be getting a CT scan soon , just here for some maintenance     06/25/2021 Patient states that she's feeling pretty good isn't in as much pain as she had been having still stiff at times needs her adjustment    07/23/2021 Patient states time for a tune up, neck and shoulder are tight giving her a little bit of a headache    08/13/2021 Patient states that she is doing the neck and shoulder are tight still getting a headache   5/16/023 Patient states she is doing pain is intermittent but over all better    10/02/2021 Patient states time for a tune up    11/01/2021 Patient states that she is a little tight this week    12/04/2021 Patient states the pain isnt as intense , has been in [place]    01/01/2022 Patient states ready for  tune and some    01/17/2022 Patient states the knee is great , hip is better , here for adjustment in the neck and shoulders   02/08/2022 Patient states she is in pain , neck shoulder and lower back    03/11/2022 Patient states that pain in her right knee is back  03/18/2022 Patient states   03/22/2022 Patient states that her back wont cooperate , when she is sitting or laying she will get pain from her knees up to buttock that feels like a burning , tylenol is not working , no MOI, she doesn't know if its coming from her knees  or from her lower back , she is able to do silver sneaker , but sitting and driving is painful    04/01/2022 Patient states she's doing had MRI     04/08/2022 Patient states will give her the phone number to [place] , here for adjustment   05/10/2022 Patient states neck is acting up       Relevant Historical Information: Hashimoto's, DDD lumbar spine, history of SVT  Additional pertinent review of systems negative.  Current Outpatient Medications  Medication Sig Dispense Refill   Cholecalciferol (VITAMIN D-3) 125 MCG (5000 UT) TABS Take 5,000 Units by mouth See admin instructions. Monday and Friday 30 tablet    dicyclomine (BENTYL) 10 MG capsule Take 1 capsule (10 mg total) by mouth 3 (three) times daily as needed for spasms (abdominal pain). 90 capsule 1   ezetimibe (ZETIA) 10 MG tablet Take 1 tablet (10 mg total) by mouth at bedtime. 90 tablet 3   fluticasone (FLONASE) 50 MCG/ACT nasal spray Place 2 sprays into both nostrils daily. 16 g 0   levothyroxine (SYNTHROID) 75 MCG tablet Take 75 mcg by mouth daily before breakfast.     losartan (COZAAR) 25 MG tablet Take 1 tablet (25 mg total) by mouth daily. 90 tablet 2   metoprolol succinate (TOPROL-XL) 50 MG 24 hr tablet TAKE 1 TABLET BY MOUTH EVERY DAY WITH OR IMMEDIATELY FOLLOWING A MEAL 90 tablet 1   Multiple Vitamin (MULTIVITAMIN WITH MINERALS) TABS tablet Take 1 tablet by mouth daily.     omeprazole (PRILOSEC) 40 MG capsule Take 1 capsule (40 mg total) by mouth daily. (Patient taking differently: Take 40 mg by mouth every other day.) 30 capsule 11   ondansetron (ZOFRAN-ODT) 4 MG disintegrating tablet Take 4 mg by mouth as needed.     polyethylene glycol (MIRALAX / GLYCOLAX) packet Take 17 g by mouth daily as needed for mild constipation.      Polyvinyl Alcohol-Povidone PF (REFRESH) 1.4-0.6 % SOLN Apply 1 drop to eye 3 (three) times daily as needed (for dry eyes).     pravastatin (PRAVACHOL) 20 MG tablet Take 1 tablet (20 mg total) by mouth every evening. 30 tablet 11   prednisoLONE acetate (PRED FORTE) 1 % ophthalmic suspension Place 1 drop into both eyes 2 times daily.     Probiotic  Product (PROBIOTIC ADVANCED PO) Take 1 capsule by mouth daily.      tretinoin (RETIN-A) 0.05 % cream Apply topically at bedtime.     White Petrolatum-Mineral Oil (SYSTANE NIGHTTIME) OINT SMARTSIG:1 Inch(es) In Eye(s) Every Night     No current facility-administered medications for this visit.      Objective:     Vitals:   05/10/22 1248  BP: 130/80  Pulse: 67  SpO2: 97%  Weight: 165 lb (74.8 kg)  Height: '5\' 3"'$  (1.6 m)      Body mass index is 29.23 kg/m.    Physical Exam:     General: Well-appearing, cooperative, sitting comfortably in no acute distress.   OMT Physical Exam:   ASIS Compression Test: Positive Right Cervical: TTP paraspinal, C4-7 RRSR Rib: Bilateral elevated first rib with TTP, worse on left Thoracic: TTP paraspinal, T7-10 RRSL Lumbar:  TTP paraspinal, L1-3 RRSL Pelvis: Right anterior innominate    Electronically signed by:  [name] D.[facility] 1:09 PM 05/10/22

## 2022-05-10 ENCOUNTER — Ambulatory Visit: Payer: Medicare HMO | Admitting: Sports Medicine

## 2022-05-10 ENCOUNTER — Telehealth: Payer: Self-pay | Admitting: *Deleted

## 2022-05-10 VITALS — BP 130/80 | HR 67 | Ht 63.0 in | Wt 165.0 lb

## 2022-05-10 DIAGNOSIS — M9901 Segmental and somatic dysfunction of cervical region: Secondary | ICD-10-CM

## 2022-05-10 DIAGNOSIS — M5136 Other intervertebral disc degeneration, lumbar region: Secondary | ICD-10-CM | POA: Diagnosis not present

## 2022-05-10 DIAGNOSIS — M5441 Lumbago with sciatica, right side: Secondary | ICD-10-CM

## 2022-05-10 DIAGNOSIS — M9902 Segmental and somatic dysfunction of thoracic region: Secondary | ICD-10-CM

## 2022-05-10 DIAGNOSIS — M9905 Segmental and somatic dysfunction of pelvic region: Secondary | ICD-10-CM

## 2022-05-10 DIAGNOSIS — G8929 Other chronic pain: Secondary | ICD-10-CM

## 2022-05-10 DIAGNOSIS — M9903 Segmental and somatic dysfunction of lumbar region: Secondary | ICD-10-CM

## 2022-05-10 DIAGNOSIS — M51369 Other intervertebral disc degeneration, lumbar region without mention of lumbar back pain or lower extremity pain: Secondary | ICD-10-CM

## 2022-05-10 DIAGNOSIS — M9908 Segmental and somatic dysfunction of rib cage: Secondary | ICD-10-CM

## 2022-05-10 DIAGNOSIS — M542 Cervicalgia: Secondary | ICD-10-CM | POA: Diagnosis not present

## 2022-05-10 NOTE — Telephone Encounter (Signed)
Princess Bruins, MD sent to [facility] Triage Caller: Unspecified (Today,  8:34 AM) Send Tinidazole 2 tab PO BID x 2 days.  If no improvement, go to Urgent Care or bring patient in next week.

## 2022-05-10 NOTE — Telephone Encounter (Signed)
Pt complains of vaginal odor. Requesting any recommendations to get rid of odor. Last OV 05/07/2022, Routing to Provider for advice.

## 2022-05-10 NOTE — Telephone Encounter (Signed)
Called pt and relayed message she would like to come in to see Provider. Appointment scheduled

## 2022-05-15 ENCOUNTER — Ambulatory Visit: Payer: Medicare HMO | Admitting: Obstetrics & Gynecology

## 2022-05-20 ENCOUNTER — Encounter: Payer: Self-pay | Admitting: Sports Medicine

## 2022-05-20 NOTE — Progress Notes (Unsigned)
[name] D.[place] Phone: [phone]   Assessment and Plan:     There are no diagnoses linked to this encounter.  *** - Patient has received significant relief with OMT in the past.  Elects for repeat OMT today.  Tolerated well per note below. - Decision today to treat with OMT was based on Physical Exam   After verbal consent patient was treated with HVLA (high velocity low amplitude), ME (muscle energy), FPR (flex positional release), ST (soft tissue), PC/PD (Pelvic Compression/ Pelvic Decompression) techniques in cervical, rib, thoracic, lumbar, and pelvic areas. Patient tolerated the procedure well with improvement in symptoms.  Patient educated on potential side effects of soreness and recommended to rest, hydrate, and use Tylenol as needed for pain control.   Pertinent previous records reviewed include ***   Follow Up: ***     Subjective:   I, Haadi Santellan, am serving as a Education administrator for Doctor Glennon Mac  Chief Complaint: OMT follow up    HPI:  03/23/21 Patient with chronic back/neck pain hoping to avoid surgical intervention.   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 She feels like she is doing better. Meloxicam is helping    05/25/2021 Patient states that she's okay will hopefully be getting a CT scan soon , just here for some maintenance     06/25/2021 Patient states that she's feeling pretty good isn't in as much pain as she had been having still stiff at times needs her adjustment    07/23/2021 Patient states time  for a tune up, neck and shoulder are tight giving her a little bit of a headache    08/13/2021 Patient states that she is doing the neck and shoulder are tight still getting a headache   5/16/023 Patient states she is doing pain is intermittent but over all better    10/02/2021 Patient states time for a tune up    11/01/2021 Patient states that she is a little tight this week    12/04/2021 Patient states the pain isnt as intense , has been in [place]    01/01/2022 Patient states ready for  tune and some    01/17/2022 Patient states the knee is great , hip is better , here for adjustment in the neck and shoulders   02/08/2022 Patient states she is in pain , neck shoulder and lower back    03/11/2022 Patient states that pain in her right knee is back    03/18/2022 Patient states   03/22/2022 Patient states that her back wont cooperate , when she is sitting or laying she will get pain from her knees up to buttock that feels like a burning , tylenol is not working , no MOI, she doesn't know if its coming from her knees  or from her lower back , she is able to do silver sneaker , but sitting and driving is painful    04/01/2022 Patient states she's doing had MRI    04/08/2022 Patient states will give her the phone number to [place] , here for adjustment  05/10/2022 Patient states neck is acting up    05/21/2022 Patient states     Relevant Historical Information: Hashimoto's, DDD lumbar spine, history of SVT  Additional pertinent review of systems negative.  Current Outpatient Medications  Medication Sig Dispense Refill   Cholecalciferol (VITAMIN D-3) 125 MCG (5000 UT) TABS Take 5,000 Units by mouth See admin instructions. Monday and Friday 30 tablet    dicyclomine (BENTYL) 10 MG capsule Take 1 capsule (10 mg total) by mouth 3 (three) times daily as needed for spasms (abdominal pain). 90 capsule 1   ezetimibe (ZETIA) 10 MG tablet Take 1 tablet (10 mg total) by mouth at  bedtime. 90 tablet 3   fluticasone (FLONASE) 50 MCG/ACT nasal spray Place 2 sprays into both nostrils daily. 16 g 0   levothyroxine (SYNTHROID) 75 MCG tablet Take 75 mcg by mouth daily before breakfast.     losartan (COZAAR) 25 MG tablet Take 1 tablet (25 mg total) by mouth daily. 90 tablet 2   metoprolol succinate (TOPROL-XL) 50 MG 24 hr tablet TAKE 1 TABLET BY MOUTH EVERY DAY WITH OR IMMEDIATELY FOLLOWING A MEAL 90 tablet 1   Multiple Vitamin (MULTIVITAMIN WITH MINERALS) TABS tablet Take 1 tablet by mouth daily.     omeprazole (PRILOSEC) 40 MG capsule Take 1 capsule (40 mg total) by mouth daily. (Patient taking differently: Take 40 mg by mouth every other day.) 30 capsule 11   ondansetron (ZOFRAN-ODT) 4 MG disintegrating tablet Take 4 mg by mouth as needed.     polyethylene glycol (MIRALAX / GLYCOLAX) packet Take 17 g by mouth daily as needed for mild constipation.      Polyvinyl Alcohol-Povidone PF (REFRESH) 1.4-0.6 % SOLN Apply 1 drop to eye 3 (three) times daily as needed (for dry eyes).     pravastatin (PRAVACHOL) 20 MG tablet Take 1 tablet (20 mg total) by mouth every evening. 30 tablet 11   prednisoLONE acetate (PRED FORTE) 1 % ophthalmic suspension Place 1 drop into both eyes 2 times daily.     Probiotic Product (PROBIOTIC ADVANCED PO) Take 1 capsule by mouth daily.      tretinoin (RETIN-A) 0.05 % cream Apply topically at bedtime.     White Petrolatum-Mineral Oil (SYSTANE NIGHTTIME) OINT SMARTSIG:1 Inch(es) In Eye(s) Every Night     No current facility-administered medications for this visit.      Objective:     There were no vitals filed for this visit.    There is no height or weight on file to calculate BMI.    Physical Exam:     General: Well-appearing, cooperative, sitting comfortably in no acute distress.   OMT Physical Exam:  ASIS Compression Test: Positive Right Cervical: TTP paraspinal, *** Rib: Bilateral elevated first rib with TTP Thoracic: TTP  paraspinal,*** Lumbar: TTP paraspinal,*** Pelvis: Right anterior innominate  Electronically signed by:  [name] D.[facility] 11:54 AM 05/20/22

## 2022-05-21 ENCOUNTER — Ambulatory Visit
Admission: RE | Admit: 2022-05-21 | Discharge: 2022-05-21 | Disposition: A | Discharge status: Home / Self Care | Payer: Medicare HMO | Source: Ambulatory Visit | Attending: Internal Medicine | Admitting: Internal Medicine

## 2022-05-21 ENCOUNTER — Ambulatory Visit: Payer: Medicare HMO | Admitting: Sports Medicine

## 2022-05-21 ENCOUNTER — Ambulatory Visit (INDEPENDENT_AMBULATORY_CARE_PROVIDER_SITE_OTHER): Payer: Medicare HMO

## 2022-05-21 VITALS — BP 120/82 | HR 56 | Ht 63.0 in | Wt 165.0 lb

## 2022-05-21 VITALS — BP 128/73 | HR 63 | Temp 98.5°F | Resp 16

## 2022-05-21 DIAGNOSIS — M25551 Pain in right hip: Secondary | ICD-10-CM | POA: Diagnosis not present

## 2022-05-21 DIAGNOSIS — K120 Recurrent oral aphthae: Secondary | ICD-10-CM

## 2022-05-21 DIAGNOSIS — K1379 Other lesions of oral mucosa: Secondary | ICD-10-CM | POA: Diagnosis not present

## 2022-05-21 LAB — POCT RAPID STREP A (OFFICE): Rapid Strep A Screen: NEGATIVE

## 2022-05-21 MED ORDER — TRIAMCINOLONE ACETONIDE 0.1 % MT PSTE: 1.0000 | PASTE | Freq: Two times a day (BID) | OROMUCOSAL | 12 refills | Status: DC

## 2022-05-21 MED ORDER — CETIRIZINE HCL 1 MG/ML PO SOLN: 5.0000 mg | Freq: Every day | ORAL | 0 refills | Status: DC

## 2022-05-21 NOTE — ED Triage Notes (Signed)
Pt c/o soreness to roof of mouth, "throat feels tight" sx started yesterday-NAD-steady gait

## 2022-05-21 NOTE — ED Provider Notes (Signed)
[facility]  Note:  This document was prepared using Systems analyst and may include unintentional dictation errors.  MRN: [id number] DOB: May 02, 1950  Subjective:   [name] is a 72 y.o. female presenting for 1 day history of acute onset oral pain, throat pain over the roof of her mouth, tightness in her throat.  No hives, chest tightness, difficulty breathing, white plaques on her tongue.  No current facility-administered medications for this encounter.  Current Outpatient Medications:    Cholecalciferol (VITAMIN D-3) 125 MCG (5000 UT) TABS, Take 5,000 Units by mouth See admin instructions. Monday and Friday, Disp: 30 tablet, Rfl:    dicyclomine (BENTYL) 10 MG capsule, Take 1 capsule (10 mg total) by mouth 3 (three) times daily as needed for spasms (abdominal pain)., Disp: 90 capsule, Rfl: 1   ezetimibe (ZETIA) 10 MG tablet, Take 1 tablet (10 mg total) by mouth at bedtime., Disp: 90 tablet, Rfl: 3   fluticasone (FLONASE) 50 MCG/ACT nasal spray, Place 2 sprays into both nostrils daily., Disp: 16 g, Rfl: 0   levothyroxine (SYNTHROID) 75 MCG tablet, Take 75 mcg by mouth daily before breakfast., Disp: , Rfl:    losartan (COZAAR) 25 MG tablet, Take 1 tablet (25 mg total) by mouth daily., Disp: 90 tablet, Rfl: 2   metoprolol succinate (TOPROL-XL) 50 MG 24 hr tablet, TAKE 1 TABLET BY MOUTH EVERY DAY WITH OR IMMEDIATELY FOLLOWING A MEAL, Disp: 90 tablet, Rfl: 1   Multiple Vitamin (MULTIVITAMIN WITH MINERALS) TABS tablet, Take 1 tablet by mouth daily., Disp: , Rfl:    omeprazole (PRILOSEC) 40 MG capsule, Take 1 capsule (40 mg total) by mouth daily. (Patient taking differently: Take 40 mg by mouth every other day.), Disp: 30 capsule, Rfl: 11   ondansetron (ZOFRAN-ODT) 4 MG disintegrating tablet, Take 4 mg by mouth as needed., Disp: , Rfl:    polyethylene glycol (MIRALAX / GLYCOLAX) packet, Take 17 g by mouth daily as needed for mild  constipation. , Disp: , Rfl:    Polyvinyl Alcohol-Povidone PF (REFRESH) 1.4-0.6 % SOLN, Apply 1 drop to eye 3 (three) times daily as needed (for dry eyes)., Disp: , Rfl:    pravastatin (PRAVACHOL) 20 MG tablet, Take 1 tablet (20 mg total) by mouth every evening., Disp: 30 tablet, Rfl: 11   prednisoLONE acetate (PRED FORTE) 1 % ophthalmic suspension, Place 1 drop into both eyes 2 times daily., Disp: , Rfl:    Probiotic Product (PROBIOTIC ADVANCED PO), Take 1 capsule by mouth daily. , Disp: , Rfl:    tretinoin (RETIN-A) 0.05 % cream, Apply topically at bedtime., Disp: , Rfl:    White Petrolatum-Mineral Oil (SYSTANE NIGHTTIME) OINT, SMARTSIG:1 Inch(es) In Eye(s) Every Night, Disp: , Rfl:    Allergies  Allergen Reactions   Atorvastatin Other (See Comments)    Muscle aches  Other Reaction(s): muscle pain/soreness  Other reaction(s): muscle pain/soreness    Muscle aches    Muscle pain   Diphenhydramine Palpitations    Other reaction(s): tachycardia   Epinephrine Palpitations   Praluent [Alirocumab]     runny nose, sinus congestion, myalgias, UTI, weight gain   Repatha [Evolocumab]     Joint pain    Past Medical History:  Diagnosis Date   Arthritis    Neck   CIN I (cervical intraepithelial neoplasia I) 4970   Complication of anesthesia    delayed SVT:  can occur from a few hours to 3 days after the general anesthesia or conscious sedation  as happened with her colonoscopy.    Dysplasia of cervix, low grade (CIN 1)    LEEP cervical conization September 2017 margins negative CIN-1   GERD (gastroesophageal reflux disease)    Hashimoto's thyroiditis    History of carpal tunnel syndrome    Left   History of colon polyps 2010   Benign   Hyperlipidemia    Hypothyroidism    Hashimoto's per pt    Osteopenia 10/2017   T score -1.9 FRAX 8.8% / 0.7%.  Stable from prior DEXA.   SVT (supraventricular tachycardia)    Wears glasses      Past Surgical History:  Procedure Laterality Date    BREAST SURGERY  1976   CYST REMOVED   CARDIAC ELECTROPHYSIOLOGY STUDY AND ABLATION  2001   CESAREAN SECTION  1987   COLONOSCOPY     COLONOSCOPY WITH PROPOFOL N/A 02/12/2019   Procedure: COLONOSCOPY WITH PROPOFOL;  Surgeon: Wonda Horner, MD;  Location: [facility];  Service: Endoscopy;  Laterality: N/A;   LEEP  2017   left thumb surgery and Carpal Tunnel Release  9/14   lower lid surgery   09/2018   dry eye from Hashimoto's   POLYPECTOMY  02/12/2019   Procedure: POLYPECTOMY;  Surgeon: Wonda Horner, MD;  Location: [facility];  Service: Endoscopy;;    Family History  Problem Relation Age of Onset   Hypertension Mother    Heart disease Mother    Mitral valve prolapse Mother    Glaucoma Mother    Arthritis Mother    Von Willebrand disease Mother    Stroke Mother    Rheum arthritis Mother    Neuropathy Mother    Hypertension Father    Heart disease Father    Colon polyps Father    Glaucoma Father    Arthritis Father    Stroke Father    Rheum arthritis Father    Pulmonary embolism Father    Arthritis Sister    Stroke Brother        TIA   Hypertension Brother    Mitral valve prolapse Brother    Glaucoma Brother    Arthritis Brother     Social History   Tobacco Use   Smoking status: Never   Smokeless tobacco: Never  Vaping Use   Vaping Use: Never used  Substance Use Topics   Alcohol use: Not Currently   Drug use: No    ROS   Objective:   Vitals: BP 128/73 (BP Location: Left Arm)   Pulse 63   Temp 98.5 F (36.9 C) (Oral)   Resp 16   LMP 11/26/2000 Comment: not SA  SpO2 98%   Physical Exam Constitutional:      General: She is not in acute distress.    Appearance: Normal appearance. She is well-developed. She is not ill-appearing, toxic-appearing or diaphoretic.  HENT:     Head: Normocephalic and atraumatic.     Nose: Nose normal.     Mouth/Throat:     Mouth: Mucous membranes are moist.     Pharynx: No pharyngeal swelling, oropharyngeal  exudate, posterior oropharyngeal erythema or uvula swelling.     Tonsils: No tonsillar exudate or tonsillar abscesses. 0 on the right. 0 on the left.   Eyes:     General: No scleral icterus.       Right eye: No discharge.        Left eye: No discharge.     Extraocular Movements: Extraocular movements intact.  Cardiovascular:  Rate and Rhythm: Normal rate.  Pulmonary:     Effort: Pulmonary effort is normal.  Skin:    General: Skin is warm and dry.  Neurological:     General: No focal deficit present.     Mental Status: She is alert and oriented to person, place, and time.  Psychiatric:        Mood and Affect: Mood normal.        Behavior: Behavior normal.     Results for orders placed or performed during the hospital encounter of 05/21/22 (from the past 24 hour(s))  POCT rapid strep A     Status: None   Collection Time: 05/21/22  2:47 PM  Result Value Ref Range   Rapid Strep A Screen Negative Negative    Assessment and Plan :   PDMP not reviewed this encounter.  1. Aphthous ulcer of mouth   2. Oral pain     Airway is patent, low suspicion for anaphylaxis, allergic reaction.  Recommended managing the aphthous ulcer with triamcinolone paste.  Also recommended general supportive care for her throat symptoms.  Strep culture pending. Counseled patient on potential for adverse effects with medications prescribed/recommended today, ER and return-to-clinic precautions discussed, patient verbalized understanding.    [name], [place] 05/21/22 1510

## 2022-05-21 NOTE — Patient Instructions (Addendum)
Good to see you  Hip HEP  - Start meloxicam 15 mg daily x2 weeks.  If still having pain after 2 weeks, complete 3rd-week of meloxicam. May use remaining meloxicam as needed once daily for pain control.  Do not to use additional NSAIDs while taking meloxicam.  May use Tylenol [phone] mg 2 to 3 times a day for breakthrough pain. Follow up Friday for hip injection

## 2022-05-23 ENCOUNTER — Other Ambulatory Visit: Payer: Self-pay | Admitting: Internal Medicine

## 2022-05-23 NOTE — Progress Notes (Deleted)
[name] D.[place] [name] Phone: [phone]   Assessment and Plan:     There are no diagnoses linked to this encounter.  ***   Pertinent previous records reviewed include ***   Follow Up: ***     Subjective:   I, [name], am serving as a Education administrator for [name]  Chief Complaint: OMT follow up    HPI:  03/23/21 Patient with chronic back/neck pain hoping to avoid surgical intervention.   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 She feels like she is doing better. Meloxicam is helping    05/25/2021 Patient states that she's okay will hopefully be getting a CT scan soon , just here for some maintenance     06/25/2021 Patient states that she's feeling pretty good isn't in as much pain as she had been having still stiff at times needs her adjustment    07/23/2021 Patient states time for a tune up, neck and shoulder are tight giving her a little bit of a headache    08/13/2021 Patient states that she is doing the neck and shoulder are tight still getting a headache   5/16/023 Patient states she is doing pain is intermittent but over all better    10/02/2021 Patient states time for a tune up    11/01/2021 Patient states that she is a little tight this week    12/04/2021 Patient states the pain isnt as intense , has been in [place]    01/01/2022 Patient states ready for  tune and some    01/17/2022 Patient states the knee is great , hip is better , here for adjustment in the neck and shoulders    02/08/2022 Patient states she is in pain , neck shoulder and lower back    03/11/2022 Patient states that pain in her right knee is back    03/18/2022 Patient states right side pain    03/22/2022 Patient states that her back wont cooperate , when she is sitting or laying she will get pain from her knees up to buttock that feels like a burning , tylenol is not working , no MOI, she doesn't know if its coming from her knees  or from her lower back , she is able to do silver sneaker , but sitting and driving is painful    04/01/2022 Patient states she's doing had MRI    04/08/2022 Patient states will give her the phone number to [place] , here for adjustment    05/10/2022 Patient states neck is acting up    05/21/2022 Patient states right sided body pain   05/24/2022 Patient states     Relevant Historical Information: Hashimoto's, DDD lumbar spine, history of SVT  Additional pertinent review of systems negative.   Current Outpatient Medications:    cetirizine HCl (ZYRTEC) 1 MG/ML solution, Take 5 mLs (5 mg total) by mouth daily., Disp: 300 mL, Rfl: 0   Cholecalciferol (VITAMIN D-3) 125 MCG (5000 UT) TABS, Take 5,000 Units by mouth See admin instructions. Monday and Friday, Disp: 30 tablet, Rfl:  dicyclomine (BENTYL) 10 MG capsule, Take 1 capsule (10 mg total) by mouth 3 (three) times daily as needed for spasms (abdominal pain)., Disp: 90 capsule, Rfl: 1   ezetimibe (ZETIA) 10 MG tablet, Take 1 tablet (10 mg total) by mouth at bedtime., Disp: 90 tablet, Rfl: 3   fluticasone (FLONASE) 50 MCG/ACT nasal spray, Place 2 sprays into both nostrils daily., Disp: 16 g, Rfl: 0   levothyroxine (SYNTHROID) 75 MCG tablet, Take 75 mcg by mouth daily before breakfast., Disp: , Rfl:    losartan (COZAAR) 25 MG tablet, Take 1 tablet (25 mg total) by mouth daily., Disp: 90 tablet, Rfl: 2   metoprolol succinate (TOPROL-XL) 50 MG 24 hr tablet, TAKE 1 TABLET BY MOUTH EVERY DAY WITH OR IMMEDIATELY FOLLOWING  A MEAL, Disp: 90 tablet, Rfl: 1   Multiple Vitamin (MULTIVITAMIN WITH MINERALS) TABS tablet, Take 1 tablet by mouth daily., Disp: , Rfl:    omeprazole (PRILOSEC) 40 MG capsule, Take 1 capsule (40 mg total) by mouth daily. (Patient taking differently: Take 40 mg by mouth every other day.), Disp: 30 capsule, Rfl: 11   ondansetron (ZOFRAN-ODT) 4 MG disintegrating tablet, Take 4 mg by mouth as needed., Disp: , Rfl:    polyethylene glycol (MIRALAX / GLYCOLAX) packet, Take 17 g by mouth daily as needed for mild constipation. , Disp: , Rfl:    Polyvinyl Alcohol-Povidone PF (REFRESH) 1.4-0.6 % SOLN, Apply 1 drop to eye 3 (three) times daily as needed (for dry eyes)., Disp: , Rfl:    pravastatin (PRAVACHOL) 20 MG tablet, Take 1 tablet (20 mg total) by mouth every evening., Disp: 30 tablet, Rfl: 11   prednisoLONE acetate (PRED FORTE) 1 % ophthalmic suspension, Place 1 drop into both eyes 2 times daily., Disp: , Rfl:    Probiotic Product (PROBIOTIC ADVANCED PO), Take 1 capsule by mouth daily. , Disp: , Rfl:    tretinoin (RETIN-A) 0.05 % cream, Apply topically at bedtime., Disp: , Rfl:    triamcinolone (KENALOG) 0.1 % paste, Use as directed 1 Application in the mouth or throat 2 (two) times daily., Disp: 5 g, Rfl: 12   White Petrolatum-Mineral Oil (SYSTANE NIGHTTIME) OINT, SMARTSIG:1 Inch(es) In Eye(s) Every Night, Disp: , Rfl:    Objective:     There were no vitals filed for this visit.    There is no height or weight on file to calculate BMI.    Physical Exam:    ***   Electronically signed by:  [name] D.[facility] 7:37 AM 05/23/22

## 2022-05-24 ENCOUNTER — Ambulatory Visit: Payer: Medicare HMO | Admitting: Sports Medicine

## 2022-05-24 LAB — CULTURE, GROUP A STREP (THRC)

## 2022-05-27 ENCOUNTER — Other Ambulatory Visit (HOSPITAL_COMMUNITY): Payer: Medicare HMO

## 2022-05-28 ENCOUNTER — Inpatient Hospital Stay: Admit: 2022-05-28 | Discharge: 2022-05-28 | Payer: PRIVATE HEALTH INSURANCE | Primary: Internal Medicine

## 2022-05-28 ENCOUNTER — Encounter: Admit: 2022-05-28 | Payer: PRIVATE HEALTH INSURANCE | Attending: Internal Medicine | Primary: Family Medicine

## 2022-05-28 ENCOUNTER — Ambulatory Visit: Admit: 2022-05-28 | Payer: PRIVATE HEALTH INSURANCE | Attending: Rheumatology | Primary: Family Medicine

## 2022-05-28 DIAGNOSIS — M1990 Unspecified osteoarthritis, unspecified site: Secondary | ICD-10-CM

## 2022-05-28 DIAGNOSIS — M549 Dorsalgia, unspecified: Secondary | ICD-10-CM

## 2022-05-28 DIAGNOSIS — R768 Other specified abnormal immunological findings in serum: Secondary | ICD-10-CM

## 2022-05-28 DIAGNOSIS — E785 Hyperlipidemia, unspecified: Secondary | ICD-10-CM

## 2022-05-28 DIAGNOSIS — E063 Autoimmune thyroiditis: Secondary | ICD-10-CM

## 2022-05-28 DIAGNOSIS — E079 Disorder of thyroid, unspecified: Secondary | ICD-10-CM

## 2022-05-28 LAB — SEDIMENTATION RATE (ESR): BKR SEDIMENTATION RATE, ERYTHROCYTE: 1 mm/hr (ref 0–20)

## 2022-05-28 LAB — C-REACTIVE PROTEIN     (CRP): BKR C-REACTIVE PROTEIN, HIGH SENSITIVITY: <0.2 mg/L

## 2022-05-28 MED ORDER — LEVOTHYROXINE 75 MCG TABLET
75 MCG | Freq: Every day | ORAL | Status: AC
Start: 2022-05-28 — End: ?

## 2022-05-28 NOTE — Patient Instructions
Please do X-ray of sacroiliac joints and do blood work (will check for inflammation).You have no signs of rheumatoid arthritis or lupus. Low positive ANA test is due to Hashimoto's. Your back pains are related to degenerative joint disease.

## 2022-05-28 NOTE — Progress Notes
I discussed Sara Wilson's case with Dr. Christ Kick and agree with the history, examination, assessment and plans.  Please see his note for details.We are consulted for evaluation of a positive ANA of 1: 40 and 1:80, joint pains have been present for last 5 years and include bilateral knees, hips shoulder, midthoracic and lower lumbar spine many of these symptoms are worst in the evening or with movement without any associated swelling she takes occasional NSAIDs.  Lumbar back pain is present both in the morning as well as evening associated with morning stiffness that lasts more than an hour, it does not wake her up at night, she takes NSAIDs without consistent relief.  She has a prior history of Hashimoto and thyroid eye disease.  No history of parotid enlargement, oral ulcerations, skin rashes, photosensitivity, serositis, heartburn, Raynaud's phenomena, chest pain, shortness of breath or abdominal pain.  No prior history of any thrombotic episode.  On examination no synovitis across upper and lower extremity joints. Hip joint internal rotation is painful on right side along with trochanteric bursa tenderness.   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  Heart and lung examination were normal no organomegaly on abdominal examination.  No active skin rashes.A/P discussed that the low titer ANA with negative double-stranded DNA and ENA is not clinically  significant discussed various causes of false-positive ANA is including prior history of Hashimoto, strong family history of autoimmunity as well as positive ANA is with advancing age.  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  Most recent lumbar spine x-ray per report does not show any sacroiliitis, show some pubic symphysis OA.For completeness sake we will request ESR, CRP and X ray pelvis to r/o sacroiliitis.Rest per Dr. Salome Spotted.

## 2022-05-29 NOTE — Progress Notes
[facility] [place] [address]UEAVW: [phone]: [phone] Dept Fax: [phone]/09/2022  Lateefah Younger [name] is a 72 y.o. female who is referred by Provider Not In System, for consultation with rheumatology. 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 Sx started about 5 years ago and mainly lower back, neck, shoulders, hips (R>L), previously knees (had 3 steroid injections to R knee over years, last injection about 6-8 weeks ago by Sports medicine). Injection helps for 6 weeks or so; pain is not severe and does not interrupt activities. Stiffness in the morning for about 1 hour, lessens with movements; comes back with prolonged immobility (recently R hip bothers more). Does stretching, aerobics, a little bit of weights at the gym -- 4-5/week. Lower back stiffness/pain has been more longstanding, probably 15 years. She wants to make sure she has no RA as her mother had it. She had low titer ANA 1:40 to 1:80, saw Rheumatologist last March who did additional tests which were negative (below) and reassured her.ROS pos for dry eyes (seen by Ophthalmology; dry mouth (does not wake up to drink water) off and on; tears on the palate; had hair loss 5 years ago (when she had hyperthyroidism) but now hair is good. Occasional swallowing difficulties (sees ENT, was told phlegm in the back of the throat or GERD, on PPI qOD). Review of Systems (negative except as noted; positive findings bold)Constitutional: weight changes, fatigue, malaise, fever, chills, sweats Skin: rashes, photosensitivity, hives, easy bruisability, alopecia, scalp tenderness, skin nodules or psoriasis Eyes:  pain, redness, itching, visual blurring, dryness, foreign body sensationENT: tinnitus, hearing loss, sinus congestion, loss of smell, dry nose, bloody nose, jaw claudication, oral ulcers, loss of taste, dry mouth, hoarsenessCardiac: chest pain, palpitations, irregular heartbeat, exertional dyspneaVascular: Raynaud?s, chilblains, frostbite, venous stasis, thrombosisPulmonary: shortness of breath, cough, wheeze, hemoptysis, chest wall pain, pleuritic chest pain, current tobacco use GI: difficulty swallowing, nausea, vomiting, GERD, ulcers, constipation, diarrhea, change in bowel habits, abdominal pain, liver diseaseGU: dysuria, blood in urine, nocturia, infections, kidney stonesEndocrine/Reproductive: cold intolerance, heat intolerance, miscarriageMusculoskeletal: morning stiffness, neck pain, back pain, joint pain, joint swelling, muscle aching or tenderness, muscle weaknessNeurological: numbness, tingling, headaches, fainting, dizziness, imbalance, memory loss, seizure, strokeHeme/Lymph: swollen or tender glands, anemiaPsychiatric:  anxiety, irritability, depression, sleep disturbance PMH: Hashimoto's thyroiditisThyroid eye disease (last 5-6 years)OASVT s/p ablationHLDHTNMedications:Current Outpatient Medications Medication Sig ? ezetimibe Take 1 tablet (10 mg total) by mouth at bedtime. ? losartan Take 1 tablet (25 mg total) by mouth. ? meloxicam PLEASE SEE ATTACHED FOR DETAILED DIRECTIONS ? metoprolol succinate XL Take 2 tablets (50 mg total) by mouth. ? omega-3 acid ethyl esters Take 1 capsule (1 g total) by mouth daily. ? omeprazole Take 1 capsule (40 mg total) by mouth every other day. ? pravastatin 1 tablet No current facility-administered medications for this visit. levothyroxine 75 mcg dailyAllergies: Epinephrine Family History:  Mother had RA and sarcoidosis. Brother has metabolic disorder without clear nameSister has thyroid disease, unclear what type2 sisters with psoriasis. Past Medical History: Wynell Younger [name] has a past medical history of Disease of thyroid gland, Hashimoto's disease, and Hyperlipidemia. Social History: No smoking or alcohol. Retired. Was Charity fundraiser. Volunteers in the community.  Physical Exam:BP 118/71  - Pulse 74  - Temp 97.3 ?F (36.3 ?C) (No-touch scanner)  - SpO2 94% General: Patient appears well, in no acute distressHEENT:  Mild conjunctival erythema R eye, ptosis of lower eyelidsNeck:  No LAD.Back: full ROM in the C spine. Schober's test 3 cm. Respiratory: Clear to auscultation bilaterallyCVS: Regular rate and rhythm, S1,S2GI: soft; non-tender; non-distendedExtremities: no synovitis in UE or LE. Tenderness over R greater trochanterSkin: No rashes or lesionsPsychiatric: Mood/affect appropriate, well-kempt  Labs and Imaging:Lab Results Component Value Date  WBC 5.1 11/28/2021  HGB 13.7 11/28/2021  HCT 37.50 11/28/2021  MCV 81.0 11/28/2021  MCH 29.6 11/28/2021  MCHC 36.5 (H) 11/28/2021  PLT 149 (L) 11/28/2021  MPV 10.5 11/28/2021  NEUTROPHILS 56.8 11/28/2021  LYMPHOCYTES 35.2 11/28/2021  MONOCYTES 7.2 11/28/2021  EOSINOPHILS 0.6 11/28/2021  Lab Results Component Value Date  NA 141 11/28/2021  K 3.6 11/28/2021  CL 106 11/28/2021  CO2 23 11/28/2021  GLU 95 11/28/2021  BUN 12 11/28/2021  CREATININE 0.85 11/28/2021  CALCIUM 10.4 (H) 11/28/2021  ALBUMIN 4.7 11/28/2021  PROT 7.2 11/28/2021  BILITOT 0.5 11/28/2021  ALKPHOS 91 11/28/2021  ALT 21 11/28/2021  AST 21 11/28/2021  GLOB 2.5 11/28/2021  No results found for: HSCRP, SEDRATE3/2023: ANA 1:80 speckled+ATPO 90Negative ENA, Negative RF/CCP. APLA negative1/30/2024:DG hip unilateral with pelvisCOMPARISON: ?[place] abdomen pelvis 09/21/2018 FINDINGS: Mildly decreased bone mineralization. The bilateral sacroiliac and bilateral femoroacetabular joint spaces are maintained. Minimal pubic symphysis joint space narrowing and subchondral sclerosis. No acute fracture or dislocation. Vascular phleboliths overlie the pelvis. IMPRESSION: Minimal pubic symphysis osteoarthritis. No acute fracture. 04/26/22 LUMBAR EPIDURAL INJECTION: An interlaminar approach was performed on the right at L5-S1. The overlying skin was cleansed and anesthetized. A 3.5 inch 20 gauge epidural needle was advanced using loss-of-resistance technique. DIAGNOSTIC EPIDURAL INJECTION: Injection of Isovue-M 200 shows a good epidural pattern with spread above and below the level of needle placement, primarily on the right. No vascular opacification is seen. THERAPEUTIC EPIDURAL INJECTION: 80 mg of?Depo-Medrol mixed with 3 mL of 1% lidocaine were instilled. The procedure was well-tolerated, and the patient was discharged thirty minutes following the injection in good condition. COMPLICATIONS: None immediate. IMPRESSION: Technically successful interlaminar epidural injection on the right at L5-S1.  03/26/22 MRI lumbar spine IMPRESSION: 1. Multilevel lumbar spondylosis, slightly progressed at L3-4 and L4-5. 2. Mild canal stenosis at L3-4 and L4-5. 3. Severe right and moderate left foraminal stenosis at L5-S1. 4. Chronic calcified 7 mm lesion within the cauda equina at the L1-2 level is stable and has been present dating to at least 2008. 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   Assessment and Plan:Shawanna Younger [name] is a 72 y.o. 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 She had normal inflammatory markers previously and is able to maintain activity despite pain; in fact, she is able to exercise in the gym 4-5 times a week and feels that knee pains could be due to overuse injury. We do not have access to actual imaging (only reports) but appears that OA in the knee is mild. Despite some inflammatory features in her pain (eg, stiffness in the morning or after inactivity, only up to 30-60 min), there is low suspicion for spondyloarthritis. Weakly positive ANA is likely due to thyroid disease, and is overall common, up to 15-20% of population of this age; there is no signs of connective tissue disease. We will repeat CRP/ESR and obtain XR of SIJ to complete evaluation. Provided reassurance to the  patient -- no suspicion for rheumatoid arthritis or lupus. Modify activity to avoid frequent squats, but encouraged to continue quadriceps strengthening exercises for OA. Tylenol for pain control. She knows to avoid NSAIDs. Follow up with her Sports medicine specialist in [place]. No further follow up in Rheumatology clinic required, but will be happy to see again if new symptoms arise. Patient was seen and d/w Dr. Myer Peer. Christ Kick, MDRheumatology fellowAddendum: CRP/ESR normal. SIJ with mild sclerosis, nonspecific -- likely degenerative.In addition, borderline hypercalcemia with h/o elevated PTH -- c/w primary hyperparathyroidism. Will clarify with the patient if treatment/monitoring was discussed for this.

## 2022-06-03 ENCOUNTER — Other Ambulatory Visit: Payer: Self-pay

## 2022-06-03 ENCOUNTER — Ambulatory Visit (HOSPITAL_COMMUNITY)
Admission: RE | Admit: 2022-06-03 | Discharge: 2022-06-03 | Disposition: A | Discharge status: Home / Self Care | Payer: Medicare HMO | Source: Ambulatory Visit | Attending: Internal Medicine | Admitting: Internal Medicine

## 2022-06-03 ENCOUNTER — Encounter: Payer: Self-pay | Admitting: Internal Medicine

## 2022-06-03 DIAGNOSIS — K259 Gastric ulcer, unspecified as acute or chronic, without hemorrhage or perforation: Secondary | ICD-10-CM | POA: Diagnosis present

## 2022-06-03 DIAGNOSIS — K219 Gastro-esophageal reflux disease without esophagitis: Secondary | ICD-10-CM | POA: Diagnosis present

## 2022-06-03 DIAGNOSIS — R1011 Right upper quadrant pain: Secondary | ICD-10-CM | POA: Diagnosis present

## 2022-06-03 DIAGNOSIS — K5900 Constipation, unspecified: Secondary | ICD-10-CM

## 2022-06-03 DIAGNOSIS — D135 Benign neoplasm of extrahepatic bile ducts: Secondary | ICD-10-CM | POA: Insufficient documentation

## 2022-06-06 NOTE — Progress Notes (Signed)
[name] D.[facility] Phone: [phone]   Assessment and Plan:     1. Right hip pain 2. Muscle strain of right gluteal region, initial encounter  -Chronic with exacerbation, subsequent visit - Recurrence of right hip pain that essentially had no improvement with 2-week course of meloxicam 15 mg daily - Based on HPI and physical exam, I believe most likely cause of patient's discomfort is gluteal tendon strain at attachment greater trochanter.  Differential includes intra-articular pathology versus lumbar pathology - Patient elected for gluteal tendon CSI.  Tolerated well per note below - Recommend discontinuing meloxicam at this time - Continue HEP for hip  Procedure: Ultrasound Guided Glute Insertion Injection/Needling.   Side: Right Diagnosis: Strain of right gluteal musculature [place] Indication:  - accuracy is paramount for diagnosis - to ensure therapeutic efficacy or procedural success - to reduce procedural risk  After explaining the procedure, viable alternatives, risks, and answering any questions, consent was given verbally. The site was cleaned with chlorhexidine prep. An ultrasound transducer was placed over the greater trochanter.    The glute medius was identified.  There was fluid surrounding the tendons.  The tendons were thin with hypoechoic stranding through tendon fibers. With ultrasound guidance and sterile technique the tendon was injected with 2 mL lidocaine 1% and 1 mL Kenalog 40 mg/milliliter.  This was well tolerated.  The needle was removed and dressing placed and post injection instructions were given including  a discussion of likely return of pain today after the anesthetic wears off (with the possibility of worsened pain).    Pt was advised to call or return to clinic if these symptoms worsen or fail to improve as anticipated.   Pertinent previous records reviewed include none    Follow Up: 2 to 3 weeks for reevaluation.  Could consider right hip MRI versus intra-articular right hip CSI based on presentation   Subjective:   I, [name], am serving as a Education administrator for Doctor Glennon Mac  Chief Complaint: OMT follow up    HPI:  03/23/21 Patient with chronic back/neck pain hoping to avoid surgical intervention.   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 She feels like she is doing better. Meloxicam is helping    05/25/2021 Patient states that she's okay will hopefully be getting a CT scan soon , just here for some maintenance     06/25/2021 Patient states that she's feeling pretty good isn't in as much pain as she had been having still stiff at times needs her adjustment    07/23/2021 Patient states time for a tune up, neck and shoulder are tight giving her a little bit of a headache    08/13/2021 Patient states that she is doing the neck and shoulder are tight still getting a headache   5/16/023 Patient states she is doing pain is intermittent but over all better    10/02/2021 Patient states time for a tune up    11/01/2021 Patient states that she is a little tight this week    12/04/2021 Patient states the pain  isnt as intense , has been in [place]    01/01/2022 Patient states ready for  tune and some    01/17/2022 Patient states the knee is great , hip is better , here for adjustment in the neck and shoulders   02/08/2022 Patient states she is in pain , neck shoulder and lower back    03/11/2022 Patient states that pain in her right knee is back     03/18/2022 Patient states right side pain    03/22/2022 Patient states that her back wont cooperate , when she is sitting or laying she will get pain from her knees up to buttock that feels like a burning , tylenol is not working , no MOI, she doesn't know if its coming from her knees  or from her lower back , she is able to do silver sneaker , but sitting and driving is painful    04/01/2022 Patient states she's doing had MRI    04/08/2022 Patient states will give her the phone number to [place] , here for adjustment    05/10/2022 Patient states neck is acting up    05/21/2022 Patient states right sided body pain    06/07/2022 Patient states that right hip and sometimes knee, Wednesday was a bad day , better but still doesn't know what to do long term    Relevant Historical Information: Hashimoto's, DDD lumbar spine, history of SVT  Additional pertinent review of systems negative.   Current Outpatient Medications:    Cholecalciferol (VITAMIN D-3) 125 MCG (5000 UT) TABS, Take 5,000 Units by mouth See admin instructions. Monday and Friday, Disp: 30 tablet, Rfl:    dicyclomine (BENTYL) 10 MG capsule, TAKE 1 CAPSULE (10 MG TOTAL) BY MOUTH 3 (THREE) TIMES DAILY AS NEEDED FOR SPASMS (ABDOMINAL PAIN)., Disp: 270 capsule, Rfl: 1   ezetimibe (ZETIA) 10 MG tablet, Take 1 tablet (10 mg total) by mouth at bedtime., Disp: 90 tablet, Rfl: 3   fluticasone (FLONASE) 50 MCG/ACT nasal spray, Place 2 sprays into both nostrils daily., Disp: 16 g, Rfl: 0   levothyroxine (SYNTHROID) 75 MCG tablet, Take 75 mcg by mouth daily before breakfast., Disp: , Rfl:    losartan (COZAAR) 25 MG tablet, Take 1 tablet (25 mg total) by mouth daily., Disp: 90 tablet, Rfl: 2   metoprolol succinate (TOPROL-XL) 50 MG 24 hr tablet, TAKE 1 TABLET BY MOUTH EVERY DAY WITH OR IMMEDIATELY FOLLOWING A MEAL, Disp: 90 tablet, Rfl: 1   Multiple Vitamin (MULTIVITAMIN WITH MINERALS) TABS tablet, Take 1 tablet by mouth daily., Disp: , Rfl:     omeprazole (PRILOSEC) 40 MG capsule, Take 1 capsule (40 mg total) by mouth daily. (Patient taking differently: Take 40 mg by mouth every other day.), Disp: 30 capsule, Rfl: 11   polyethylene glycol (MIRALAX / GLYCOLAX) packet, Take 17 g by mouth daily as needed for mild constipation. , Disp: , Rfl:    Polyvinyl Alcohol-Povidone PF (REFRESH) 1.4-0.6 % SOLN, Apply 1 drop to eye 3 (three) times daily as needed (for dry eyes)., Disp: , Rfl:    pravastatin (PRAVACHOL) 20 MG tablet, Take 1 tablet (20 mg total) by mouth every evening., Disp: 30 tablet, Rfl: 11   prednisoLONE acetate (PRED FORTE) 1 % ophthalmic suspension, Place 1 drop into both eyes 2 times daily., Disp: , Rfl:    Probiotic Product (PROBIOTIC ADVANCED PO), Take 1 capsule by mouth daily. , Disp: , Rfl:    tretinoin (RETIN-A) 0.05 % cream, Apply topically at bedtime., Disp: ,  Rfl:    White Petrolatum-Mineral Oil (SYSTANE NIGHTTIME) OINT, SMARTSIG:1 Inch(es) In Eye(s) Every Night, Disp: , Rfl:    cetirizine HCl (ZYRTEC) 1 MG/ML solution, Take 5 mLs (5 mg total) by mouth daily., Disp: 300 mL, Rfl: 0   ondansetron (ZOFRAN-ODT) 4 MG disintegrating tablet, Take 4 mg by mouth as needed., Disp: , Rfl:    triamcinolone (KENALOG) 0.1 % paste, Use as directed 1 Application in the mouth or throat 2 (two) times daily., Disp: 5 g, Rfl: 12   Objective:     Vitals:   06/07/22 1306  BP: 118/82  Pulse: 82  SpO2: 97%  Weight: 166 lb (75.3 kg)  Height: 5' 3"$  (1.6 m)      Body mass index is 29.41 kg/m.    Physical Exam:    General: awake, alert, and oriented no acute distress, nontoxic Skin: no suspicious lesions or rashes Neuro:sensation intact distally with no deficits, normal muscle tone, no atrophy, strength 5/5 in all tested lower ext groups Psych: normal mood and affect, speech clear   Right hip: No deformity, swelling or wasting ROM Flexion 90, ext 30, IR 40, ER 45 TTP hip flexors, gluteal musculature, lumbar spine NTTP over the   greater trochanter,  si joint,  Negative log roll with FROM Negative FABER Positive FADIR for lateral hip pain Positive piriformis test for groin pain and lateral hip pain Positive trendelenberg Gait normal     Electronically signed by:  [name] D.[facility] 2:15 PM 06/07/22

## 2022-06-07 ENCOUNTER — Ambulatory Visit: Payer: Medicare HMO | Admitting: Sports Medicine

## 2022-06-07 ENCOUNTER — Ambulatory Visit: Payer: Self-pay

## 2022-06-07 VITALS — BP 118/82 | HR 82 | Ht 63.0 in | Wt 166.0 lb

## 2022-06-07 DIAGNOSIS — M25551 Pain in right hip: Secondary | ICD-10-CM

## 2022-06-07 DIAGNOSIS — S76011A Strain of muscle, fascia and tendon of right hip, initial encounter: Secondary | ICD-10-CM

## 2022-06-07 NOTE — Patient Instructions (Addendum)
Good to see you  2-3 week follow up

## 2022-06-18 ENCOUNTER — Encounter: Payer: Self-pay | Admitting: Nurse Practitioner

## 2022-06-18 ENCOUNTER — Ambulatory Visit: Payer: Medicare HMO | Admitting: Nurse Practitioner

## 2022-06-18 VITALS — BP 120/80

## 2022-06-18 DIAGNOSIS — N952 Postmenopausal atrophic vaginitis: Secondary | ICD-10-CM | POA: Diagnosis not present

## 2022-06-18 DIAGNOSIS — N898 Other specified noninflammatory disorders of vagina: Secondary | ICD-10-CM

## 2022-06-18 MED ORDER — ESTRADIOL 0.1 MG/GM VA CREA: 1.0000 | TOPICAL_CREAM | VAGINAL | 1 refills | Status: DC

## 2022-06-18 NOTE — Progress Notes (Signed)
   Acute Office Visit  Subjective:    Patient ID: [name], female    DOB: Dec 20, 1950, 72 y.o.   MRN: [id number]   HPI 72 y.o. presents today for persistent urinary urgency, vaginal odor and to discuss vaginal estrogen. Wet prep negative for pathogens but had many bacteria and WBCs 05/07/22 when seen for vaginal odor. Treated for BV. No change in symptoms. Saw PCP 06/10/22, negative urine culture. Denies burning with urination, frequency, hematuria, incontinence, vaginal discharge or itching.   Review of Systems  Constitutional: Negative.   Genitourinary:  Positive for urgency. Negative for dysuria, frequency, hematuria, vaginal discharge and vaginal pain.       Vaginal odor       Objective:    Physical Exam Constitutional:      Appearance: Normal appearance.  Genitourinary:    Vagina: Normal.     Cervix: Normal.     Comments: Mild atrophic changes    BP 120/80 (BP Location: Right Arm, Patient Position: Sitting, Cuff Size: Normal)   LMP 11/26/2000 Comment: not SA Wt Readings from Last 3 Encounters:  06/07/22 166 lb (75.3 kg)  05/21/22 165 lb (74.8 kg)  05/10/22 165 lb (74.8 kg)        Patient informed chaperone available to be present for breast and/or pelvic exam. Patient has requested no chaperone to be present. Patient has been advised what will be completed during breast and pelvic exam.   Assessment & Plan:   Problem List Items Addressed This Visit   None Visit Diagnoses     Atrophic vaginitis    -  Primary   Relevant Medications   estradiol (ESTRACE VAGINAL) 0.1 MG/GM vaginal cream (Start on 06/19/2022)   Vaginal odor          Plan: Normal urine culture and wet prep recently. No change in symptoms. Agreeable to try vaginal estrogen 3 times weekly at night. Educated on proper use and risk of small amount of systemic absorption. Aware may take 6-8 weeks for noticeable difference. If symptoms persist urology referral recommended and she is agreeable.      [name], 11:52 AM 06/18/2022

## 2022-06-26 NOTE — Progress Notes (Unsigned)
[name] D.[place] Phone: [phone]   Assessment and Plan:    1. Right hip pain 2. Muscle strain of right gluteal region, subsequent encounter -Chronic with exacerbation, subsequent visit - Overall improvement in right hip and gluteal pain after gluteal tendon insertion site CSI performed at previous office visit on 06/07/2022 - No further treatment necessary at this time - Continue HEP for hip  3. Right calf pain -Acute, uncomplicated, initial visit - Uncertain etiology of right calf pain.  I feel most likely causes muscular soreness after having cramps in right calf.  Patient does not appear to have strain of gastroc as single and double leg squats and resisted plantarflexion do not reproduce pain - May use Xylocaine patches on back of calf for temporary relief - May use Voltaren gel over areas of pain - May continue Tylenol as needed - Patient recently completed course of meloxicam, so I do not want to repeat an NSAID course and such quick succession.  Could be considered at follow-up visit if no improvement or worsening of symptoms  Pertinent previous records reviewed include none   Follow Up: As needed if no improvement or worsening of symptoms.  Could consider ultrasound imaging versus PT versus NSAID course   Subjective:   I, Dante Roudebush, am serving as a Education administrator for [employer]  Chief Complaint: OMT follow up    HPI:  03/23/21 Patient with chronic back/neck pain hoping to avoid surgical intervention.   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 She feels like she is doing better. Meloxicam is helping    05/25/2021 Patient states that she's okay will hopefully be getting a CT scan soon , just here for some maintenance     06/25/2021 Patient states that she's feeling pretty good isn't in as much pain as she had been having still stiff at times needs her adjustment    07/23/2021 Patient states time for a tune up, neck and shoulder are tight giving her a little bit of a headache    08/13/2021 Patient states that she is doing the neck and shoulder are tight still getting a headache   5/16/023 Patient states she is doing pain is intermittent but over all better    10/02/2021 Patient states time for a tune up    11/01/2021 Patient states that she is a little tight this week    12/04/2021 Patient states the pain isnt as intense , has been in [place]    01/01/2022 Patient states ready for  tune and some    01/17/2022 Patient states the knee is great , hip is better , here for adjustment in the neck and shoulders   02/08/2022 Patient states she is in pain , neck shoulder and lower back    03/11/2022 Patient states that pain in her right knee is back    03/18/2022 Patient states right side pain    03/22/2022 Patient states that her back wont cooperate , when she is sitting or laying she will get pain from her knees up to buttock that  feels like a burning , tylenol is not working , no MOI, she doesn't know if its coming from her knees  or from her lower back , she is able to do silver sneaker , but sitting and driving is painful    04/01/2022 Patient states she's doing had MRI    04/08/2022 Patient states will give her the phone number to [facility] , here for adjustment    05/10/2022 Patient states neck is acting up    05/21/2022 Patient states right sided body pain    06/07/2022 Patient states that right hip and sometimes knee, Wednesday was a bad day , better but still doesn't know what  to do long term    06/27/2022 Patient states hip is better, but her calf is sore and sharp pain when she sits and lays on her side     Relevant Historical Information: Hashimoto's, DDD lumbar spine, history of SVT  Additional pertinent review of systems negative.  Current Outpatient Medications  Medication Sig Dispense Refill   Cholecalciferol (VITAMIN D-3) 125 MCG (5000 UT) TABS Take 5,000 Units by mouth See admin instructions. Monday and Friday 30 tablet    dicyclomine (BENTYL) 10 MG capsule TAKE 1 CAPSULE (10 MG TOTAL) BY MOUTH 3 (THREE) TIMES DAILY AS NEEDED FOR SPASMS (ABDOMINAL PAIN). 270 capsule 1   estradiol (ESTRACE VAGINAL) 0.1 MG/GM vaginal cream Place 1 Applicatorful vaginally 3 (three) times a week. 42.5 g 1   ezetimibe (ZETIA) 10 MG tablet Take 1 tablet (10 mg total) by mouth at bedtime. (Patient not taking: Reported on 06/18/2022) 90 tablet 3   fluticasone (FLONASE) 50 MCG/ACT nasal spray Place 2 sprays into both nostrils daily. 16 g 0   levothyroxine (SYNTHROID) 75 MCG tablet Take 75 mcg by mouth daily before breakfast.     losartan (COZAAR) 25 MG tablet Take 1 tablet (25 mg total) by mouth daily. 90 tablet 2   metoprolol succinate (TOPROL-XL) 50 MG 24 hr tablet TAKE 1 TABLET BY MOUTH EVERY DAY WITH OR IMMEDIATELY FOLLOWING A MEAL 90 tablet 1   Multiple Vitamin (MULTIVITAMIN WITH MINERALS) TABS tablet Take 1 tablet by mouth daily.     omeprazole (PRILOSEC) 40 MG capsule Take 1 capsule (40 mg total) by mouth daily. (Patient taking differently: Take 40 mg by mouth every other day.) 30 capsule 11   ondansetron (ZOFRAN-ODT) 4 MG disintegrating tablet Take 4 mg by mouth as needed.     polyethylene glycol (MIRALAX / GLYCOLAX) packet Take 17 g by mouth daily as needed for mild constipation.      Polyvinyl Alcohol-Povidone PF (REFRESH) 1.4-0.6 % SOLN Apply 1 drop to eye 3 (three) times daily as needed (for dry eyes).     pravastatin (PRAVACHOL) 20 MG tablet Take 1 tablet (20 mg total)  by mouth every evening. 30 tablet 11   prednisoLONE acetate (PRED FORTE) 1 % ophthalmic suspension Place 1 drop into both eyes 2 times daily.     Probiotic Product (PROBIOTIC ADVANCED PO) Take 1 capsule by mouth daily.      tretinoin (RETIN-A) 0.05 % cream Apply topically at bedtime.     White Petrolatum-Mineral Oil (SYSTANE NIGHTTIME) OINT SMARTSIG:1 Inch(es) In Eye(s) Every Night     No current facility-administered medications for this visit.      Objective:     Vitals:   06/27/22 1336  BP: 122/82  Pulse: 70  SpO2: 98%  Weight: 166 lb (75.3 kg)  Height: '5\' 3"'$  (1.6 m)  Body mass index is 29.41 kg/m.    Physical Exam:     Gen: Appears well, nad, nontoxic and pleasant Psych: Alert and oriented, appropriate mood and affect Neuro: sensation intact, strength is 5/5 with df/pf/inv/ev, muscle tone wnl Skin: no susupicious lesions or rashes  Right leg/ankle:  No deformity, no swelling or effusion TTP gastroc globally NTTP over fibular head, lat mal, medial mal, achilles, navicular, base of 5th, ATFL, CFL, deltoid, calcaneous or midfoot Ankle ROM DF 30, PF 45, inv/ev intact Knee ROM flexion 110 degrees, extension 0 degrees Negative ant drawer, talar tilt, rotation test, squeeze test. Neg thompson No pain with resisted dorsiflexion, plantarflexion, inversion or eversion   Electronically signed by:  [name] D.[facility] 2:02 PM 06/27/22

## 2022-06-27 ENCOUNTER — Ambulatory Visit: Payer: Medicare HMO | Admitting: Sports Medicine

## 2022-06-27 VITALS — BP 122/82 | HR 70 | Ht 63.0 in | Wt 166.0 lb

## 2022-06-27 DIAGNOSIS — M79661 Pain in right lower leg: Secondary | ICD-10-CM

## 2022-06-27 DIAGNOSIS — M25551 Pain in right hip: Secondary | ICD-10-CM

## 2022-06-27 DIAGNOSIS — S76011D Strain of muscle, fascia and tendon of right hip, subsequent encounter: Secondary | ICD-10-CM | POA: Diagnosis not present

## 2022-06-27 NOTE — Patient Instructions (Addendum)
Good to see you  ?As needed follow up  ?

## 2022-07-02 ENCOUNTER — Encounter: Payer: Self-pay | Admitting: Cardiology

## 2022-07-02 MED ORDER — METOPROLOL SUCCINATE ER 50 MG PO TB24: ORAL_TABLET | ORAL | 0 refills | Status: DC

## 2022-07-03 ENCOUNTER — Other Ambulatory Visit: Payer: Self-pay

## 2022-07-03 ENCOUNTER — Emergency Department
Admission: EM | Admit: 2022-07-03 | Discharge: 2022-07-03 | Disposition: A | Discharge status: Home / Self Care | Payer: Medicare HMO | Attending: Emergency Medicine | Admitting: Emergency Medicine

## 2022-07-03 DIAGNOSIS — E039 Hypothyroidism, unspecified: Secondary | ICD-10-CM | POA: Diagnosis not present

## 2022-07-03 DIAGNOSIS — Z5321 Procedure and treatment not carried out due to patient leaving prior to being seen by health care provider: Secondary | ICD-10-CM | POA: Diagnosis not present

## 2022-07-03 DIAGNOSIS — R070 Pain in throat: Secondary | ICD-10-CM | POA: Insufficient documentation

## 2022-07-03 DIAGNOSIS — R5383 Other fatigue: Secondary | ICD-10-CM | POA: Diagnosis not present

## 2022-07-03 DIAGNOSIS — Z1152 Encounter for screening for COVID-19: Secondary | ICD-10-CM | POA: Insufficient documentation

## 2022-07-03 LAB — BASIC METABOLIC PANEL
Anion gap: 9 (ref 5–15)
BUN: 20 mg/dL (ref 8–23)
CO2: 25 mmol/L (ref 22–32)
Calcium: 10 mg/dL (ref 8.9–10.3)
Chloride: 106 mmol/L (ref 98–111)
Creatinine, Ser: 0.89 mg/dL (ref 0.44–1.00)
GFR, Estimated: >60 mL/min (ref >60)
Glucose, Bld: 112 mg/dL — ABNORMAL HIGH (ref 70–99)
Potassium: 3.6 mmol/L (ref 3.5–5.1)
Sodium: 140 mmol/L (ref 135–145)

## 2022-07-03 LAB — CBC
HCT: 39.9 % (ref 36.0–46.0)
Hemoglobin: 14.1 g/dL (ref 12.0–15.0)
MCH: 29.4 pg (ref 26.0–34.0)
MCHC: 35.3 g/dL (ref 30.0–36.0)
MCV: 83.3 fL (ref 80.0–100.0)
Platelets: 159 10*3/uL (ref 150–400)
RBC: 4.79 MIL/uL (ref 3.87–5.11)
RDW: 15.3 % (ref 11.5–15.5)
WBC: 4.7 10*3/uL (ref 4.0–10.5)
nRBC: 0 % (ref 0.0–0.2)

## 2022-07-03 LAB — URINALYSIS, ROUTINE W REFLEX MICROSCOPIC
Bilirubin Urine: NEGATIVE
Glucose, UA: NEGATIVE mg/dL
Hgb urine dipstick: NEGATIVE
Ketones, ur: NEGATIVE mg/dL
Leukocytes,Ua: NEGATIVE
Nitrite: NEGATIVE
Protein, ur: NEGATIVE mg/dL
Specific Gravity, Urine: 1.024 (ref 1.005–1.030)
pH: 5 (ref 5.0–8.0)

## 2022-07-03 LAB — TSH: TSH: 0.873 u[IU]/mL (ref 0.350–4.500)

## 2022-07-03 LAB — RESP PANEL BY RT-PCR (RSV, FLU A&B, COVID)  RVPGX2
Influenza A by PCR: NEGATIVE
Influenza B by PCR: NEGATIVE
Resp Syncytial Virus by PCR: NEGATIVE
SARS Coronavirus 2 by RT PCR: NEGATIVE

## 2022-07-03 LAB — T4, FREE: Free T4: 0.81 ng/dL (ref 0.61–1.12)

## 2022-07-03 NOTE — ED Provider Notes (Addendum)
[facility] Emergency Department Provider Note     Event Date/Time   First MD Initiated Contact with Patient 07/03/22 1757     (approximate)   History   Fatigue   HPI  [name] is a 72 y.o. female with a history of hypothyroidism, HLD, GERD, arthritis, presents to the ED [facility].  Patient presented there for evaluation of fatigue and hand tremors with onset yesterday.  She notes an episode yesterday while she was out in the yard doing some light gardening work.  She denies any fevers, nausea, diaphoresis dizziness, headache, blurred vision, or syncope.  She does endorse some mild nausea and some intermittent chills.  The only medication changes that she discontinued her pravastatin recently at the advice of her PCP.  Patient presents today noting a separate episode of hand tremors as well as some fullness to her throat, concerning for thyroiditis.  Physical Exam   Triage Vital Signs: ED Triage Vitals  Enc Vitals Group     BP 07/03/22 1510 137/81     Pulse Rate 07/03/22 1510 72     Resp 07/03/22 1510 16     Temp 07/03/22 1510 98 F (36.7 C)     Temp src --      SpO2 07/03/22 1510 99 %     Weight 07/03/22 1507 165 lb 5.5 oz (75 kg)     Height 07/03/22 1507 '5\' 3"'$  (1.6 m)     Head Circumference --      Peak Flow --      Pain Score --      Pain Loc --      Pain Edu? --      Excl. in [place]? --     Most recent vital signs: Vitals:   07/03/22 1510 07/03/22 1918  BP: 137/81 (!) 151/82  Pulse: 72 64  Resp: 16 16  Temp: 98 F (36.7 C) 98.4 F (36.9 C)  SpO2: 99% 98%    General Awake, no distress. NAD HEENT NCAT. PERRL. EOMI. No rhinorrhea. Mucous membranes are moist.  Nonpalpable thyroid without nodularity.  Some tenderness to the right and left SCM musculature. CV:  Good peripheral perfusion. RRR RESP:  Normal effort. CTA ABD:  No distention.  NEURO: Cranial nerves II to XII grossly intact.   ED Results / Procedures / Treatments    Labs (all labs ordered are listed, but only abnormal results are displayed) Labs Reviewed  BASIC METABOLIC PANEL - Abnormal; Notable for the following components:      Result Value   Glucose, Bld 112 (*)    All other components within normal limits  URINALYSIS, ROUTINE W REFLEX MICROSCOPIC - Abnormal; Notable for the following components:   Color, Urine YELLOW (*)    APPearance HAZY (*)    All other components within normal limits  RESP PANEL BY RT-PCR (RSV, FLU A&B, COVID)  RVPGX2  CBC  TSH  T4, FREE   EKG  Vent. rate 65 BPM PR interval 168 ms QRS duration 80 ms QT/QTcB 380/395 ms P-R-T axes 65 24 68 Normal sinus rhythm Normal ECG No STEMI  RADIOLOGY  I personally viewed and evaluated these images as part of my medical decision making, as well as reviewing the written report by the radiologist.  ED Provider Interpretation:   No results found.   PROCEDURES:  Critical Care performed: No  Procedures   MEDICATIONS ORDERED IN ED: Medications - No data to display   IMPRESSION / MDM /  ASSESSMENT AND PLAN / ED COURSE  I reviewed the triage vital signs and the nursing notes.                              Differential diagnosis includes, but is not limited to, acute thyroiditis, thyroid dysfunction, viral URI, dehydration, electrolyte abnormality, sepsis  Patient's presentation is most consistent with acute complicated illness / injury requiring diagnostic workup.  Patient's diagnosis is consistent with fatigue of an unclear etiology.  Patient care will be transferred to my colleague Rachelle Hora, PA-C at this time, to await viral panel test results.  Final disposition will be determined by those results.  Patient with reassuring exam and workup at the time of this disposition.  No evidence of hypothyroidism.  Patient is to follow up with her PCP as needed or otherwise directed. Patient is given ED precautions to return to the ED for any worsening or new  symptoms.   FINAL CLINICAL IMPRESSION(S) / ED DIAGNOSES   Final diagnoses:  Other fatigue     Rx / DC Orders   ED Discharge Orders     None        Note:  This document was prepared using Dragon voice recognition software and may include unintentional dictation errors.    Melvenia Needles, PA-C 07/03/22 2014    Melvenia Needles, Hershal Coria 07/03/22 2024    Blake Divine, MD 07/03/22 2306

## 2022-07-03 NOTE — ED Provider Notes (Signed)
Patient with fatigue and anterior throat pain.  Patient seen by fellow provider and had a workup.  Lab work ordered and pending.  Lab work is all resulted now, lab work within normal limits.  Negative viral panel. Physical Exam  BP (!) 151/82 (BP Location: Left Arm)   Pulse 64   Temp 98.4 F (36.9 C) (Oral)   Resp 16   Ht '5\' 3"'$  (1.6 m)   Wt 75 kg   LMP 11/26/2000 Comment: not SA  SpO2 98%   BMI 29.29 kg/m   Physical Exam Patient appears well, ambulatory no distress.  Speaks full sentences Procedures  Procedures  ED Course / MDM    Medical Decision Making Amount and/or Complexity of Data Reviewed Labs: ordered.   72 year old female with fatigue and discomfort along the throat.  Patient had significant workup consisting of blood work, EKG, viral panel swabs.  Everything within normal limits.  Patient is scheduled to follow-up with PCP outpatient.  She understands signs symptoms return to the ER for.       Renata Caprice 07/03/22 2138    Blake Divine, MD 07/03/22 [id number]

## 2022-07-03 NOTE — Discharge Instructions (Addendum)
Your exam, labs, and EKG are normal and reassuring at this time.  Continue to monitor symptoms to follow with your primary provider as discussed.

## 2022-07-03 NOTE — ED Triage Notes (Signed)
Pt to ED via POV from [facility]. Pt seen at [facility] for fatigue and tremors that started yesterday. Pt with hx hypothyroidism. Pt denies dizziness, HA or blurred vision. PCP stopped pravastatin. Pt sent over for further evaluation.

## 2022-07-05 MED ORDER — METOPROLOL SUCCINATE ER 50 MG PO TB24: ORAL_TABLET | ORAL | 0 refills | Status: DC

## 2022-07-05 NOTE — Addendum Note (Signed)
Addended by: Stanton Kidney on: 07/05/2022 09:04 AM   Modules accepted: Orders

## 2022-07-08 ENCOUNTER — Encounter: Payer: Self-pay | Admitting: Sports Medicine

## 2022-07-15 ENCOUNTER — Telehealth: Payer: Self-pay | Admitting: Cardiology

## 2022-07-15 NOTE — Telephone Encounter (Signed)
Return call to patient. Unable to determine who was trying to reach her. Advised if it was from our office they would reach out again. Patient was appreciative of return call.

## 2022-07-15 NOTE — Telephone Encounter (Signed)
Patient is returning call.  °

## 2022-07-30 ENCOUNTER — Ambulatory Visit (INDEPENDENT_AMBULATORY_CARE_PROVIDER_SITE_OTHER): Payer: Medicare HMO | Admitting: Nurse Practitioner

## 2022-07-30 ENCOUNTER — Other Ambulatory Visit (HOSPITAL_COMMUNITY)
Admission: RE | Admit: 2022-07-30 | Discharge: 2022-07-30 | Disposition: A | Discharge status: Home / Self Care | Payer: Medicare HMO | Source: Ambulatory Visit | Attending: Nurse Practitioner | Admitting: Nurse Practitioner

## 2022-07-30 VITALS — BP 124/82 | HR 74 | Ht 63.0 in | Wt 166.0 lb

## 2022-07-30 DIAGNOSIS — Z9189 Other specified personal risk factors, not elsewhere classified: Secondary | ICD-10-CM | POA: Diagnosis not present

## 2022-07-30 DIAGNOSIS — R87810 Cervical high risk human papillomavirus (HPV) DNA test positive: Secondary | ICD-10-CM | POA: Insufficient documentation

## 2022-07-30 DIAGNOSIS — M8589 Other specified disorders of bone density and structure, multiple sites: Secondary | ICD-10-CM

## 2022-07-30 DIAGNOSIS — N952 Postmenopausal atrophic vaginitis: Secondary | ICD-10-CM

## 2022-07-30 DIAGNOSIS — Z780 Asymptomatic menopausal state: Secondary | ICD-10-CM

## 2022-07-30 DIAGNOSIS — Z01419 Encounter for gynecological examination (general) (routine) without abnormal findings: Secondary | ICD-10-CM

## 2022-07-30 DIAGNOSIS — Z124 Encounter for screening for malignant neoplasm of cervix: Secondary | ICD-10-CM | POA: Insufficient documentation

## 2022-07-30 DIAGNOSIS — Z1151 Encounter for screening for human papillomavirus (HPV): Secondary | ICD-10-CM | POA: Diagnosis not present

## 2022-07-30 DIAGNOSIS — R87610 Atypical squamous cells of undetermined significance on cytologic smear of cervix (ASC-US): Secondary | ICD-10-CM

## 2022-07-30 NOTE — Progress Notes (Signed)
[name] 1951-02-14 [id number]   History:  72 y.o. G1P1001 presents for breast and pelvic exam. Postmenopausal. Started vaginal estrogen  month ago for atrophic vaginitis. See pap history below.  Hypothyroidism managed by endocrinology.   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    Contraception: post menopausal status Sexually active: No  Health maintenance Last Pap: 06/28/2021. Results were: ASCUS + HR HPV, 1-year repeat Last mammogram: 11/2021. Results were: Normal Last colonoscopy: 02/12/2019. Results were: Polyp, 10-year recall Last Dexa: 02/05/2022. Results were: T-score -2.0, FRAX 8.% / 1.7%  Past medical history, past surgical history, family history and social history were all reviewed and documented in the EPIC chart. Single. Retired. Daughter lives local.   ROS:  A ROS was performed and pertinent positives and negatives are included.  Exam:  Vitals:   07/30/22 1120  BP: 124/82  Pulse: 74  SpO2: 100%  Weight: 166 lb (75.3 kg)  Height: 5\' 3"  (1.6 m)     Body mass index is 29.41 kg/m.  General appearance:  Normal Thyroid:  Symmetrical, normal in size, without palpable masses or nodularity. Respiratory  Auscultation:  Clear without wheezing or rhonchi Cardiovascular  Auscultation:  Regular rate, without rubs, murmurs or gallops  Edema/varicosities:  Not grossly evident Abdominal  Soft,nontender, without masses, guarding or rebound.  Liver/spleen:  No organomegaly noted  Hernia:  None appreciated  Skin  Inspection:  Grossly normal   Breasts: Examined lying and sitting.   Right: Without masses, retractions, discharge or axillary adenopathy.   Left: Without  masses, retractions, discharge or axillary adenopathy. Genitourinary   Inguinal/mons:  Normal without inguinal adenopathy  External genitalia:  Normal appearing vulva with no masses, tenderness, or lesions  BUS/Urethra/Skene's glands:  Normal  Vagina:  Normal appearing with normal color and discharge, no lesions. Atrophic changes  Cervix:  Normal appearing without discharge or lesions. Reparative changes  Uterus:  Normal in size, shape and contour.  Midline and mobile, nontender  Adnexa/parametria:     Rt: Normal in size, without masses or tenderness.   Lt: Normal in size, without masses or tenderness.  Anus and perineum: Normal  Digital rectal exam: Deferred  Patient informed chaperone available to be present for breast and pelvic exam. Patient has requested no chaperone to be present. Patient has been advised what will be completed during breast and pelvic exam.   Assessment/Plan:  72 y.o. G1P1001 for breast and pelvic exam.   Encounter for breast and pelvic examination -  Education provided on SBEs, importance of preventative screenings, current guidelines, high calcium diet, regular exercise, and multivitamin daily. Labs done elsewhere.  Osteopenia of multiple sites - T-score -2.0 without elevated FRAX. Taking daily Vitamin D and does silver sneakers 4-5 days per week.   ASCUS with positive high risk HPV cervical - Plan: Cytology - PAP( ) - See HPI for pap history.   Atrophic vaginitis - Started vaginal estrogen a month ago. Doing well on this. Will reach out when refills needed.   Screening for breast cancer - Normal mammogram history.  Continue annual screenings.  Normal breast exam today.  Screening for colon cancer - 2020 colonoscopy. Will repeat at 10-year interval per GI's recommendation.   Follow up in 1 year  for breast and pelvic.       Olivia Mackie [facility], 11:28 AM 07/30/2022

## 2022-08-02 ENCOUNTER — Telehealth: Payer: Self-pay

## 2022-08-02 LAB — CYTOLOGY - PAP
Comment: NEGATIVE
Diagnosis: UNDETERMINED — AB
High risk HPV: POSITIVE — AB

## 2022-08-02 NOTE — Telephone Encounter (Signed)
Pt LVM in triage line re: pap test results.  Returned call to pt to notify her that we have not yet received results. Pt reported that she was told by someone that she would receive pap test results within 24 hours.   I advised pt that it is rare that results come back that fast. I usually like to advise pt's that wait could be 5-7 business days for results and apologized for any miscommunication/misunderstanding and advised her that we will notify her once results have been reviewed by provider.   Pt voiced understanding and appreciation for call. Will route to provider for final review and close encounter.

## 2022-08-07 ENCOUNTER — Other Ambulatory Visit: Payer: Self-pay

## 2022-08-07 ENCOUNTER — Telehealth: Payer: Self-pay | Admitting: *Deleted

## 2022-08-07 DIAGNOSIS — R011 Cardiac murmur, unspecified: Secondary | ICD-10-CM

## 2022-08-07 DIAGNOSIS — Z87410 Personal history of cervical dysplasia: Secondary | ICD-10-CM

## 2022-08-07 DIAGNOSIS — R87610 Atypical squamous cells of undetermined significance on cytologic smear of cervix (ASC-US): Secondary | ICD-10-CM

## 2022-08-07 DIAGNOSIS — I10 Essential (primary) hypertension: Secondary | ICD-10-CM

## 2022-08-07 MED ORDER — LOSARTAN POTASSIUM 25 MG PO TABS: 25.0000 mg | ORAL_TABLET | Freq: Every day | ORAL | 2 refills | Status: DC

## 2022-08-07 NOTE — Telephone Encounter (Signed)
-----   Message from Olivia Mackie, NP sent at 08/05/2022  8:39 AM EDT ----- Please let Max know her pap came back showing ASCUS + HR HPV again. Needs colposcopy. Thanks.

## 2022-08-07 NOTE — Telephone Encounter (Signed)
Spoke with patient, advised per Elmarie Shiley, NP.  Patient is familiar with colposcopy, states she has had a history of abnormal PAPs and 3 colpos. Patient states her last colpo she had a conversation with Dr. Seymour Bars and states she was advised to let Dr. Seymour Bars know when she wanted to proceed with treatment rather than continue with colpos. Patient is requesting options be reviewed with Dr. Seymour Bars, declines to schedule colpo at this time. Will await recommendations from Dr. Seymour Bars.   Dr. Seymour Bars -please review and advise.

## 2022-08-08 NOTE — Telephone Encounter (Signed)
[name], MD  You4 minutes ago (7:44 AM)    With ASCUS/HPV HR Pos.  Previous HPV 16 Pos.  Last Colp 12/2020 Mild Dysplasia.Marland KitchenMarland Kitchen I recommend a Colposcopy.  If patient prefers, we could do Colpo with LEEP at the same visit.  I would not recommend a Hysterectomy at this time. Dr Seymour Bars

## 2022-08-10 ENCOUNTER — Other Ambulatory Visit: Payer: Self-pay | Admitting: Cardiology

## 2022-08-12 NOTE — Telephone Encounter (Signed)
Left message to call Johnnathan Hagemeister, RN at [facility], [phone], option 5.  

## 2022-08-12 NOTE — Telephone Encounter (Signed)
Spoke with patient, advised per Dr. Seymour Bars.  Patient request to proceed with Colpo and LEEP. Brief explanation provided. Patient request an afternoon appt. LEEP with colpo scheduled for 08/22/22 at 1330. Advised patient to take Motrin 800 mg with food and water one hour before procedure. Patient verbalizes understanding and is agreeable.   Order placed for LEEP with colpo, business office notified.   Routing to provider for final review. Patient is agreeable to disposition. Will close encounter.

## 2022-08-19 ENCOUNTER — Ambulatory Visit: Payer: Medicare HMO | Admitting: Sports Medicine

## 2022-08-19 NOTE — Progress Notes (Deleted)
Aleen Sells [name] [address] [place] [address] Phone: [phone]   Assessment and Plan:     There are no diagnoses linked to this encounter.  ***   Pertinent previous records reviewed include ***   Follow Up: ***     Subjective:   I, Johan Creveling, am serving as a Neurosurgeon for Doctor Richardean Sale  Chief Complaint: OMT follow up    HPI:  03/23/21 Patient with chronic back/neck pain hoping to avoid surgical intervention.   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 She feels like she is doing better. Meloxicam is helping    05/25/2021 Patient states that she's okay will hopefully be getting a CT scan soon , just here for some maintenance     06/25/2021 Patient states that she's feeling pretty good isn't in as much pain as she had been having still stiff at times needs her adjustment    07/23/2021 Patient states time for a tune up, neck and shoulder are tight giving her a little bit of a headache    08/13/2021 Patient states that she is doing the neck and shoulder are tight still getting a headache   5/16/023 Patient states she is doing pain is intermittent but over all better    10/02/2021 Patient states time for a tune up    11/01/2021 Patient states that she is a little tight this week    12/04/2021 Patient states the pain isnt as intense , has been in [place]    01/01/2022 Patient states ready for  tune and some    01/17/2022 Patient states the knee is great , hip is better , here for adjustment in the neck and shoulders    02/08/2022 Patient states she is in pain , neck shoulder and lower back    03/11/2022 Patient states that pain in her right knee is back    03/18/2022 Patient states right side pain    03/22/2022 Patient states that her back wont cooperate , when she is sitting or laying she will get pain from her knees up to buttock that feels like a burning , tylenol is not working , no MOI, she doesn't know if its coming from her knees  or from her lower back , she is able to do silver sneaker , but sitting and driving is painful    91/47/8295 Patient states she's doing had MRI    04/08/2022 Patient states will give her the phone number to [facility] , here for adjustment    05/10/2022 Patient states neck is acting up    05/21/2022 Patient states right sided body pain    06/07/2022 Patient states that right hip and sometimes knee, Wednesday was a bad day , better but still doesn't know what to do long term    06/27/2022 Patient states hip is better, but her calf is sore and sharp pain when she sits and lays on her side    08/19/2022 Patient states    Relevant Historical Information: Hashimoto's, DDD lumbar spine, history of SVT  Additional pertinent review of systems negative.  Current Outpatient Medications:    Cholecalciferol (VITAMIN D-3) 125 MCG (5000 UT) TABS, Take 5,000 Units by mouth See admin instructions. Monday and Friday, Disp: 30 tablet, Rfl:    dicyclomine (BENTYL) 10 MG capsule, TAKE 1 CAPSULE (10 MG TOTAL) BY MOUTH 3 (THREE) TIMES DAILY AS NEEDED FOR SPASMS (ABDOMINAL PAIN)., Disp: 270 capsule, Rfl: 1   estradiol (ESTRACE VAGINAL) 0.1 MG/GM vaginal cream, Place 1 Applicatorful vaginally 3 (three) times a week., Disp: 42.5 g, Rfl: 1   fluticasone (FLONASE) 50 MCG/ACT nasal spray, Place 2 sprays into both nostrils daily., Disp: 16 g, Rfl: 0   levothyroxine (SYNTHROID) 75 MCG tablet, Take 75 mcg by mouth daily before breakfast., Disp: , Rfl:    losartan (COZAAR) 25 MG tablet, Take 1  tablet (25 mg total) by mouth daily., Disp: 90 tablet, Rfl: 2   metoprolol succinate (TOPROL-XL) 50 MG 24 hr tablet, TAKE 1 TABLET BY MOUTH EVERY DAY WITH OR IMMEDIATELY FOLLOWING A MEAL, Disp: 90 tablet, Rfl: 3   Multiple Vitamin (MULTIVITAMIN WITH MINERALS) TABS tablet, Take 1 tablet by mouth daily., Disp: , Rfl:    omeprazole (PRILOSEC) 40 MG capsule, Take 1 capsule (40 mg total) by mouth daily. (Patient taking differently: Take 40 mg by mouth every other day.), Disp: 30 capsule, Rfl: 11   polyethylene glycol (MIRALAX / GLYCOLAX) packet, Take 17 g by mouth daily as needed for mild constipation. , Disp: , Rfl:    Polyvinyl Alcohol-Povidone PF (REFRESH) 1.4-0.6 % SOLN, Apply 1 drop to eye 3 (three) times daily as needed (for dry eyes)., Disp: , Rfl:    pravastatin (PRAVACHOL) 20 MG tablet, Take 1 tablet (20 mg total) by mouth every evening., Disp: 30 tablet, Rfl: 11   Probiotic Product (PROBIOTIC ADVANCED PO), Take 1 capsule by mouth daily. , Disp: , Rfl:    tretinoin (RETIN-A) 0.05 % cream, Apply topically at bedtime., Disp: , Rfl:    White Petrolatum-Mineral Oil (SYSTANE NIGHTTIME) OINT, SMARTSIG:1 Inch(es) In Eye(s) Every Night, Disp: , Rfl:    Objective:     There were no vitals filed for this visit.    There is no height or weight on file to calculate BMI.    Physical Exam:    ***   Electronically signed by:  Aleen Sells [name] 7:23 AM 08/19/22

## 2022-08-22 ENCOUNTER — Encounter: Payer: Self-pay | Admitting: Obstetrics & Gynecology

## 2022-08-22 ENCOUNTER — Other Ambulatory Visit (HOSPITAL_COMMUNITY)
Admission: RE | Admit: 2022-08-22 | Discharge: 2022-08-22 | Disposition: A | Discharge status: Home / Self Care | Payer: Medicare HMO | Source: Ambulatory Visit | Attending: Obstetrics & Gynecology | Admitting: Obstetrics & Gynecology

## 2022-08-22 ENCOUNTER — Ambulatory Visit: Payer: Medicare HMO | Admitting: Obstetrics & Gynecology

## 2022-08-22 VITALS — BP 116/70 | HR 86

## 2022-08-22 DIAGNOSIS — R87610 Atypical squamous cells of undetermined significance on cytologic smear of cervix (ASC-US): Secondary | ICD-10-CM

## 2022-08-22 DIAGNOSIS — R87810 Cervical high risk human papillomavirus (HPV) DNA test positive: Secondary | ICD-10-CM | POA: Insufficient documentation

## 2022-08-22 DIAGNOSIS — Z87410 Personal history of cervical dysplasia: Secondary | ICD-10-CM

## 2022-08-22 NOTE — Progress Notes (Signed)
    [name] [birth year] [id number]        72 y.o.  G1P1L1   RP: ASCUS/HPV HR Pos for Colpo/LEEP  HPI: ASCUS/HPV HR Pos on Pap 07/30/22.  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  Patient was worried about the HPV HR positive status and was asking for a treatment.     OB History  Gravida Para Term Preterm AB Living  1 1 1     1   SAB IAB Ectopic Multiple Live Births               # Outcome Date GA Lbr Len/2nd Weight Sex Delivery Anes PTL Lv  1 Term             Past medical history,surgical history, problem list, medications, allergies, family history and social history were all reviewed and documented in the EPIC chart.   Directed ROS with pertinent positives and negatives documented in the history of present illness/assessment and plan.  Exam:  Vitals:   08/22/22 1344  BP: 116/70  Pulse: 86  SpO2: 98%   General appearance:  Normal  Colposcopy Procedure Note [name] 08/22/2022  Indications:  ASCUS/HPV HR Positive  Procedure Details  The risks and benefits of the procedure and Written informed consent obtained.  Speculum placed in vagina and excellent visualization of cervix achieved, cervix swabbed x 3 with acetic acid solution.  Findings:  Cervix colposcopy: Physical Exam Genitourinary:        Vaginal colposcopy: As above, AW/punctation close to the cervix  Vulvar colposcopy: Normal  Perirectal colposcopy: Normal  The cervix was sprayed with Hurricane before performing the cervical biopsies.  Specimens: HPV 16-18-45.  Cervical/vaginal biopsies at 3 and 8 O'Clock.  Complications:  None, good hemostasis with Silver Nitrate . Plan: Management per results   Assessment/Plan:  72 y.o. G1P1001   1. ASCUS with positive high risk HPV cervical ASCUS/HPV HR Pos on Pap 07/30/22.  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  Patient was worried about the  HPV HR positive status and was asking for a treatment.  Colposcopy findings shared with patient.  Given no appearance of any AW/punctation at the TZ of the cervix, decision not to proceed with a LEEP.  Biopsies taken at 3 and 8 O'Clock at the junction of cervix with vagina where AW/punctation was present.  Post procedure precautions.  Management per results. - Cervicovaginal ancillary only( [facility]) - Surgical pathology( [name]/ [name])  2. History of mild cervical dysplasia - Surgical pathology( [name]/ [name])  Other orders - DULoxetine (CYMBALTA) 20 MG capsule; PLEASE SEE ATTACHED FOR DETAILED DIRECTIONS (Patient not taking: Reported on 08/22/2022) - meloxicam (MOBIC) 7.5 MG tablet; PLEASE SEE ATTACHED FOR DETAILED DIRECTIONS   Jessica Del MD, 1:49 PM 08/22/2022

## 2022-08-27 LAB — CERVICOVAGINAL ANCILLARY ONLY
Comment: NEGATIVE
Comment: NEGATIVE
HPV 16: POSITIVE — AB
HPV 18 / 45: NEGATIVE

## 2022-08-27 LAB — SURGICAL PATHOLOGY

## 2022-08-29 NOTE — Progress Notes (Signed)
[name] [name] [address] [place] [address] Phone: [phone]   Assessment and Plan:     1. Chronic pain of right knee 2. Primary osteoarthritis of right knee  -Chronic with exacerbation, subsequent visit - Consistent with flare of osteoarthritis of right knee - Patient continues to have flares of knee pain that has had temporary, though incomplete relief with CSI, Zilretta injections, physical therapy, HEP.  Conservative therapy has been tried for >6 weeks.  Patient continues to have pain frequently >6/10 and pain that limits day-to-day activities.  Recommend further evaluation with MRI at this time - We will obtain prior authorization for Zilretta injection which can be performed at follow-up visit - Continue HEP and physical therapy - May continue Tylenol for day-to-day pain relief    Pertinent previous records reviewed include none   Follow Up: 3 days of MRI to review results and perform Zilretta injection if appropriate   Subjective:   I, [name], am serving as a Neurosurgeon for Doctor [employer]  Chief Complaint: OMT follow up    HPI:  03/23/21 Patient with chronic back/neck pain hoping to avoid surgical intervention.   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 She feels like she is doing better. Meloxicam is helping    05/25/2021 Patient states that she's okay will hopefully be getting a CT scan soon , just here for some maintenance     06/25/2021 Patient states that she's feeling pretty good isn't in as much pain as she had  been having still stiff at times needs her adjustment    07/23/2021 Patient states time for a tune up, neck and shoulder are tight giving her a little bit of a headache    08/13/2021 Patient states that she is doing the neck and shoulder are tight still getting a headache   5/16/023 Patient states she is doing pain is intermittent but over all better    10/02/2021 Patient states time for a tune up    11/01/2021 Patient states that she is a little tight this week    12/04/2021 Patient states the pain isnt as intense , has been in [place]    01/01/2022 Patient states ready for  tune and some    01/17/2022 Patient states the knee is great , hip is better , here for adjustment in the neck and shoulders   02/08/2022 Patient states she is in pain , neck shoulder and lower back    03/11/2022 Patient states that pain in her right knee is back    03/18/2022 Patient states right side pain    03/22/2022 Patient states that her back wont cooperate , when she is sitting or laying she will get pain from her knees up to buttock that feels like a burning , tylenol is not working , no MOI, she doesn't know if its coming from her knees  or from her lower back , she is able to do silver sneaker , but sitting and driving is painful    21/30/8657 Patient states she's doing had MRI    04/08/2022 Patient states  will give her the phone number to [facility] , here for adjustment    05/10/2022 Patient states neck is acting up    05/21/2022 Patient states right sided body pain    06/07/2022 Patient states that right hip and sometimes knee, Wednesday was a bad day , better but still doesn't know what to do long term    06/27/2022 Patient states hip is better, but her calf is sore and sharp pain when she sits and lays on her side    08/30/2022 Patient states right knee is flared thinks its time for MRI    Relevant Historical Information: Hashimoto's, DDD lumbar spine, history of SVT Additional  pertinent review of systems negative.   Current Outpatient Medications:    Cholecalciferol (VITAMIN D-3) 125 MCG (5000 UT) TABS, Take 5,000 Units by mouth See admin instructions. Monday and Friday, Disp: 30 tablet, Rfl:    DULoxetine (CYMBALTA) 20 MG capsule, , Disp: , Rfl:    estradiol (ESTRACE VAGINAL) 0.1 MG/GM vaginal cream, Place 1 Applicatorful vaginally 3 (three) times a week., Disp: 42.5 g, Rfl: 1   fluticasone (FLONASE) 50 MCG/ACT nasal spray, Place 2 sprays into both nostrils daily., Disp: 16 g, Rfl: 0   levothyroxine (SYNTHROID) 75 MCG tablet, Take 75 mcg by mouth daily before breakfast., Disp: , Rfl:    losartan (COZAAR) 25 MG tablet, Take 1 tablet (25 mg total) by mouth daily., Disp: 90 tablet, Rfl: 2   meloxicam (MOBIC) 7.5 MG tablet, PLEASE SEE ATTACHED FOR DETAILED DIRECTIONS, Disp: , Rfl:    metoprolol succinate (TOPROL-XL) 50 MG 24 hr tablet, TAKE 1 TABLET BY MOUTH EVERY DAY WITH OR IMMEDIATELY FOLLOWING A MEAL, Disp: 90 tablet, Rfl: 3   Multiple Vitamin (MULTIVITAMIN WITH MINERALS) TABS tablet, Take 1 tablet by mouth daily., Disp: , Rfl:    omeprazole (PRILOSEC) 40 MG capsule, Take 1 capsule (40 mg total) by mouth daily. (Patient taking differently: Take 40 mg by mouth every other day.), Disp: 30 capsule, Rfl: 11   polyethylene glycol (MIRALAX / GLYCOLAX) packet, Take 17 g by mouth daily as needed for mild constipation. , Disp: , Rfl:    Polyvinyl Alcohol-Povidone PF (REFRESH) 1.4-0.6 % SOLN, Apply 1 drop to eye 3 (three) times daily as needed (for dry eyes)., Disp: , Rfl:    pravastatin (PRAVACHOL) 20 MG tablet, Take 1 tablet (20 mg total) by mouth every evening., Disp: 30 tablet, Rfl: 11   Probiotic Product (PROBIOTIC ADVANCED PO), Take 1 capsule by mouth daily. , Disp: , Rfl:    tretinoin (RETIN-A) 0.05 % cream, Apply topically at bedtime., Disp: , Rfl:    White Petrolatum-Mineral Oil (SYSTANE NIGHTTIME) OINT, SMARTSIG:1 Inch(es) In Eye(s) Every Night, Disp: , Rfl:     dicyclomine (BENTYL) 10 MG capsule, TAKE 1 CAPSULE (10 MG TOTAL) BY MOUTH 3 (THREE) TIMES DAILY AS NEEDED FOR SPASMS (ABDOMINAL PAIN)., Disp: 270 capsule, Rfl: 1   Objective:     Vitals:   08/30/22 1330  Pulse: 60  SpO2: 98%  Weight: 166 lb (75.3 kg)  Height: 5\' 3"  (1.6 m)      Body mass index is 29.41 kg/m.    Physical Exam:    General:  awake, alert oriented, no acute distress nontoxic Skin: no suspicious lesions or rashes Neuro:sensation intact, no deficits, strength 5/5 with no deficits, no atrophy, normal muscle tone Psych: No signs of anxiety, depression or other mood disorder   Right knee: Mild swelling No deformity Positive fluid wave, joint milking ROM  Flex 100, Ext 5 TTP medial femoral condyle, lateral femoral condyle, medial joint line, lateral joint line NTTP over the quad tendon, patella, plica, patella tendon, tibial tuberostiy, fibular head, posterior fossa, pes anserine bursa, gerdy's tubercle,  Neg anterior and posterior drawer Neg lachman Neg sag sign Negative varus stress Negative valgus stress Negative McMurray   Gait normal    Electronically signed by:  [name] [name] 1:45 PM 08/30/22

## 2022-08-30 ENCOUNTER — Ambulatory Visit: Payer: Medicare HMO | Admitting: Sports Medicine

## 2022-08-30 VITALS — HR 60 | Ht 63.0 in | Wt 166.0 lb

## 2022-08-30 DIAGNOSIS — M25561 Pain in right knee: Secondary | ICD-10-CM | POA: Diagnosis not present

## 2022-08-30 DIAGNOSIS — M1711 Unilateral primary osteoarthritis, right knee: Secondary | ICD-10-CM

## 2022-08-30 DIAGNOSIS — G8929 Other chronic pain: Secondary | ICD-10-CM

## 2022-08-30 NOTE — Patient Instructions (Addendum)
MRI referral  Follow up 3 days after MRI to discuss results  Zilretta auth

## 2022-09-03 ENCOUNTER — Telehealth: Payer: Self-pay

## 2022-09-03 NOTE — Telephone Encounter (Signed)
Received a faxed denial letter for the zilretta stating that neither medicare nor your medicare advantage health plan covers this drug for your condition at the frequency your doctor has prescribed. Medical studies have not proven that more than one dose per lifetime of zilretta is safe and helpful for treatment of your health problem.   I can do an appeal if you would like. I would just need a letter written with any evidence that you would like them to review on why patient needs this medication. I can then add all the office notes and imaging with this letter   This may still get denied, just let me know what you would like to do.

## 2022-09-03 NOTE — Telephone Encounter (Signed)
Ran for durolane waiting for a response from insurance

## 2022-09-04 ENCOUNTER — Encounter: Payer: Self-pay | Admitting: Cardiology

## 2022-09-05 NOTE — Progress Notes (Unsigned)
Cardiology Office Note:    Date:  09/09/2022   ID:  [name], DOB 12-Jul-1950, MRN [id number]  PCP:  Gweneth Dimitri, MD   [facility] Providers Cardiologist:  Armanda Magic, MD Electrophysiologist:  Regan Lemming, MD     Referring MD: Gweneth Dimitri, MD   Chief Complaint: shortness of breath  History of Present Illness:    [name] is a very pleasant 72 y.o. female with a hx of paroxysmal SVT s/p ablation, HTN, HLD, and hypothyroidism.  She has a long history of paroxysmal SVT.  She underwent ablation for SVT at [facility] in 2001.  Since that time she has had infrequent episodes of SVT.  Many of her episodes have occurred following surgical procedures and administration of anesthesia.  Cardiac monitor 12/17/2017 showed normal sinus rhythm, sinus bradycardia, sinus tachycardia with some nonsustained atrial tachycardia up to 13 beats and occasional PVCs with PVC burden of 1%.  Heart rate ranged from 42 to 185 bpm.  She has been followed by Dr. Elberta Fortis as well.  During office visit 06/19/2020 she was having elevated blood pressure running 130s to 170s systolic.  She also had an episode of SVT 04/2020.  She reported elevated HR 142 bpm on 06/23/2020 and was instructed to take an additional dose of her metoprolol at that time.  She was seen in EP clinic on 06/30/2020 where she was recommended for possible ablation if symptoms recurred.  During visit on 09/21/2020 she had another episode of SVT after dental procedure.  She went to [facility] where HR was mildly elevated.  She received IV fluid and Toradol with resolutions of symptoms and was discharged home.  Calcium score obtained 10/31/2020 was 0 CAC.  During office visit 06/07/2021 she reported mouth dryness, fatigue, and numbness and was being worked up for possible rheumatologic condition.  She was also having brief runs of SVT.  TTE 06/15/2021 for systolic murmur showed normal BiV function with no  evidence of valve disease.  Last clinic visit was 03/27/22 with Dr. Shari Prows.  She reported holding pravastatin to see if leg cramps improved which they did not and she resumed it.  She remained active in Silver sneakers 5 times per week.  Continuing to undergo workup for diffuse joint pain.  She had previously stopped Repatha due to weight gain. No medication changes were implemented and 6 month follow-up was recommended.  Today, she is here for evaluation of increasing shortness of breath when bending and lifting.  Also reports muscular fatigue has worsened. Has noted shortness of breath with bending over and with mowing her small lawn.  No shortness of breath with Silver sneakers. Shortness of breath is not accompanied by chest pain, N/V  or diaphoresis.  Notes HR slightly elevated up to 110 bpm, but not significantly elevated. No episodes of SVT to her awareness. At times, has so much muscle tightness it is difficult to get out of bed.  Has seen 2 rheumatologists and told she does not have RA or lupus.  There has not been a clear diagnosis for her muscle fatigue. She did not experience improvement in symptoms off simvastatin or ezetimibe.  Muscle tightness improves when she is active.  She denies orthopnea, PND, edema, presyncope, syncope. Having blood work next week with PCP to monitor cholesterol since stopping ezetimibe.   Past Medical History:  Diagnosis Date   Arthritis    Neck   CIN I (cervical intraepithelial neoplasia I) 2017   Complication of anesthesia  delayed SVT:  can occur from a few hours to 3 days after the general anesthesia or conscious sedation as happened with her colonoscopy.    Dysplasia of cervix, low grade (CIN 1)    LEEP cervical conization September 2017 margins negative CIN-1   GERD (gastroesophageal reflux disease)    Hashimoto's thyroiditis    History of carpal tunnel syndrome    Left   History of colon polyps 2010   Benign   Hyperlipidemia    Hypothyroidism     Hashimoto's per pt    Osteopenia 10/2017   T score -1.9 FRAX 8.8% / 0.7%.  Stable from prior DEXA.   SVT (supraventricular tachycardia)    Wears glasses     Past Surgical History:  Procedure Laterality Date   BREAST SURGERY  1976   CYST REMOVED   CARDIAC ELECTROPHYSIOLOGY STUDY AND ABLATION  2001   CESAREAN SECTION  1987   COLONOSCOPY     COLONOSCOPY WITH PROPOFOL N/A 02/12/2019   Procedure: COLONOSCOPY WITH PROPOFOL;  Surgeon: Graylin Shiver, MD;  Location: [facility];  Service: Endoscopy;  Laterality: N/A;   LEEP  2017   left thumb surgery and Carpal Tunnel Release  9/14   lower lid surgery   09/2018   dry eye from Hashimoto's   POLYPECTOMY  02/12/2019   Procedure: POLYPECTOMY;  Surgeon: Graylin Shiver, MD;  Location: [facility];  Service: Endoscopy;;    Current Medications: Current Meds  Medication Sig   Cholecalciferol (VITAMIN D-3) 125 MCG (5000 UT) TABS Take 5,000 Units by mouth See admin instructions. Monday and Friday   estradiol (ESTRACE VAGINAL) 0.1 MG/GM vaginal cream Place 1 Applicatorful vaginally 3 (three) times a week.   fluticasone (FLONASE) 50 MCG/ACT nasal spray Place 2 sprays into both nostrils daily.   levothyroxine (SYNTHROID) 75 MCG tablet Take 75 mcg by mouth daily before breakfast.   losartan (COZAAR) 25 MG tablet Take 1 tablet (25 mg total) by mouth daily.   meloxicam (MOBIC) 7.5 MG tablet PLEASE SEE ATTACHED FOR DETAILED DIRECTIONS   metoprolol succinate (TOPROL-XL) 50 MG 24 hr tablet TAKE 1 TABLET BY MOUTH EVERY DAY WITH OR IMMEDIATELY FOLLOWING A MEAL   Multiple Vitamin (MULTIVITAMIN WITH MINERALS) TABS tablet Take 1 tablet by mouth daily.   omeprazole (PRILOSEC) 40 MG capsule Take 1 capsule (40 mg total) by mouth daily. (Patient taking differently: Take 40 mg by mouth every other day.)   polyethylene glycol (MIRALAX / GLYCOLAX) packet Take 17 g by mouth daily as needed for mild constipation.    Polyvinyl Alcohol-Povidone PF (REFRESH) 1.4-0.6 %  SOLN Apply 1 drop to eye 3 (three) times daily as needed (for dry eyes).   pravastatin (PRAVACHOL) 20 MG tablet Take 1 tablet (20 mg total) by mouth every evening.   Probiotic Product (PROBIOTIC ADVANCED PO) Take 1 capsule by mouth daily.    tretinoin (RETIN-A) 0.05 % cream Apply topically at bedtime.   White Petrolatum-Mineral Oil (SYSTANE NIGHTTIME) OINT SMARTSIG:1 Inch(es) In Eye(s) Every Night     Allergies:   Atorvastatin, Diphenhydramine, Epinephrine, Praluent [alirocumab], and Repatha [evolocumab]   Social History   Socioeconomic History   Marital status: Widowed    Spouse name: Not on file   Number of children: 1   Years of education: Not on file   Highest education level: Master's degree (e.g., MA, MS, MEng, MEd, MSW, MBA)  Occupational History   Not on file  Tobacco Use   Smoking status: Never   Smokeless tobacco:  Never  Vaping Use   Vaping Use: Never used  Substance and Sexual Activity   Alcohol use: Not Currently   Drug use: No   Sexual activity: Not Currently    Partners: Male    Birth control/protection: Post-menopausal, Abstinence    Comment: 72 YEARS OLD, NO MORE THAN 5 PARTNERS ,des neg  Other Topics Concern   Not on file  Social History Narrative   Her daughter lives at home with her    Right handed   Caffeine: coffee, soda, (about 1 cup/day)   Social Determinants of Health   Financial Resource Strain: Not on file  Food Insecurity: Not on file  Transportation Needs: Not on file  Physical Activity: Not on file  Stress: Not on file  Social Connections: Not on file     Family History: The patient's family history includes Arthritis in her brother, father, mother, and sister; Colon polyps in her father; Glaucoma in her brother, father, and mother; Heart disease in her father and mother; Hypertension in her brother, father, and mother; Mitral valve prolapse in her brother and mother; Neuropathy in her mother; Pulmonary embolism in her father; Rheum  arthritis in her father and mother; Stroke in her brother, father, and mother; Von Willebrand disease in her mother.  ROS:   Please see the history of present illness.    + shortness of breath + muscle tightness All other systems reviewed and are negative.  Labs/Other Studies Reviewed:    The following studies were reviewed today:  Echo 06/15/21 1. Left ventricular ejection fraction, by estimation, is 60 to 65%. The  left ventricle has normal function. The left ventricle has no regional  wall motion abnormalities. Left ventricular diastolic parameters were  normal.   2. Right ventricular systolic function is normal. The right ventricular  size is normal. There is normal pulmonary artery systolic pressure. The  estimated right ventricular systolic pressure is 22.9 mmHg.   3. The mitral valve is normal in structure. Trivial mitral valve  regurgitation. No evidence of mitral stenosis.   4. The aortic valve is tricuspid. Aortic valve regurgitation is trivial.  Aortic valve sclerosis is present, with no evidence of aortic valve  stenosis.   5. The inferior vena cava is normal in size with greater than 50%  respiratory variability, suggesting right atrial pressure of 3 mmHg.  CT Cardiac Scoring 10/31/20 Coronary calcium score of 0. This was 0 percentile for age-, race-, and sex-matched controls.   Mild aortic atherosclerosis.  Cardiac Monitor 01/2018 Normal sinus rhythm, sinus bradycardia and sinus tachycardia with average heart rate 73 bpm. Heart rate ranged from 42 to 185 bpm. Occasional PVCs with PVC burden 1%. Nonsustained atrial tachycardia up to 13 beats.  Recent Labs: 03/28/2022: Magnesium 2.1 07/03/2022: BUN 20; Creatinine, Ser 0.89; Hemoglobin 14.1; Platelets 159; Potassium 3.6; Sodium 140; TSH 0.873  Recent Lipid Panel    Component Value Date/Time   CHOL 189 03/28/2022 1101   TRIG 88 03/28/2022 1101   HDL 68 03/28/2022 1101   CHOLHDL 2.8 03/28/2022 1101   CHOLHDL 3.4  12/27/2014 1055   VLDL 26 12/27/2014 1055   LDLCALC 105 (H) 03/28/2022 1101     Risk Assessment/Calculations:           Physical Exam:    VS:  BP 110/76   Pulse 82   Ht 5\' 3"  (1.6 m)   Wt 165 lb 3.2 oz (74.9 kg)   LMP 11/26/2000 Comment: not SA  SpO2 99%  BMI 29.26 kg/m     Wt Readings from Last 3 Encounters:  09/09/22 165 lb 3.2 oz (74.9 kg)  08/30/22 166 lb (75.3 kg)  07/30/22 166 lb (75.3 kg)     GEN:  Well nourished, well developed in no acute distress HEENT: Normal NECK: No JVD; No carotid bruits CARDIAC: RRR, no murmurs, rubs, gallops RESPIRATORY:  Clear to auscultation without rales, wheezing or rhonchi  ABDOMEN: Soft, non-tender, non-distended MUSCULOSKELETAL:  No edema; No deformity. 2+ pedal pulses, equal bilaterally SKIN: Warm and dry NEUROLOGIC:  Alert and oriented x 3 PSYCHIATRIC:  Normal affect   EKG:  EKG is not ordered today.        Diagnoses:    1. SVT (supraventricular tachycardia)   2. Muscle fatigue   3. Primary hypertension   4. Hyperlipidemia LDL goal <100   5. Shortness of breath    Assessment and Plan:     Shortness of breath: Increased symptoms of shortness of breath with bending and mowing grass with push mower.  No worsening of symptoms when at Silver sneakers 5 days/week. No chest pain, orthopnea, PND, or edema.  Previous calcium score of 0 10/2020. We discussed getting coronary CTA for mapping of coronary artery.  She would like to monitor for worsening symptoms and keep follow-up appointment with Dr. Shari Prows in June.  Advised her to notify us if symptoms worsen prior to next appointment.  Muscle fatigue: No clear diagnosis, has seen 2 different rheumatologists. No improvement during statin holiday or since being off ezetimibe. No significant electrolyte disturbances have occurred. Lengthy discussion about potential remedies including consistent stretching, magnesium balm, and anti-inflammatory diet.  She already follows a pretty  healthy diet.  Management per PCP/rheumatology.  Paroxysmal SVT: History of ablation. No occurrences of SVT to her awareness. Continue metoprolol.   Hypertension: BP is well-controlled.  Hyperlipidemia LDL goal < 100: CT calcium score 10/2020 of 0. Stopped ezetimibe to see if muscle fatigue improved. There has been no improvement. Lengthy discussion about taking a higher potency statin like rosuvastatin in the place of simvastatin. She does not recall trying rosuvastatin in the past. Is having lipid panel with PCP next week. Encouraged her to consider trial of atorvastatin.     Disposition: Keep your June appointment with Dr. Shari Prows  Medication Adjustments/Labs and Tests Ordered: Current medicines are reviewed at length with the patient today.  Concerns regarding medicines are outlined above.  No orders of the defined types were placed in this encounter.  No orders of the defined types were placed in this encounter.   Patient Instructions  Medication Instructions:   Your physician recommends that you continue on your current medications as directed. Please refer to the Current Medication list given to you today.   *If you need a refill on your cardiac medications before your next appointment, please call your pharmacy*   Lab Work:  None ordered.  If you have labs (blood work) drawn today and your tests are completely normal, you will receive your results only by: MyChart Message (if you have MyChart) OR A paper copy in the mail If you have any lab test that is abnormal or we need to change your treatment, we will call you to review the results.   Testing/Procedures:  None ordered.   Follow-Up: At [facility], you and your health needs are our priority.  As part of our continuing mission to provide you with exceptional heart care, we have created designated Provider Care Teams.  These  Care Teams include your primary Cardiologist (physician) and Advanced Practice  Providers (APPs -  Physician Assistants and Nurse Practitioners) who all work together to provide you with the care you need, when you need it.  We recommend signing up for the patient portal called "MyChart".  Sign up information is provided on this After Visit Summary.  MyChart is used to connect with patients for Virtual Visits (Telemedicine).  Patients are able to view lab/test results, encounter notes, upcoming appointments, etc.  Non-urgent messages can be sent to your provider as well.   To learn more about what you can do with MyChart, go to [email or web address].    Your next appointment:   1 month(s)  Provider:   Dr. Laurance Flatten        Signed, Royalti Schauf, Zachary George, NP  09/09/2022 5:23 PM    [facility]

## 2022-09-08 ENCOUNTER — Ambulatory Visit (INDEPENDENT_AMBULATORY_CARE_PROVIDER_SITE_OTHER): Payer: Medicare HMO

## 2022-09-08 DIAGNOSIS — M25561 Pain in right knee: Secondary | ICD-10-CM

## 2022-09-08 DIAGNOSIS — G8929 Other chronic pain: Secondary | ICD-10-CM

## 2022-09-09 ENCOUNTER — Ambulatory Visit: Payer: Medicare HMO | Attending: Nurse Practitioner | Admitting: Nurse Practitioner

## 2022-09-09 ENCOUNTER — Encounter: Payer: Self-pay | Admitting: Nurse Practitioner

## 2022-09-09 VITALS — BP 110/76 | HR 82 | Ht 63.0 in | Wt 165.2 lb

## 2022-09-09 DIAGNOSIS — I10 Essential (primary) hypertension: Secondary | ICD-10-CM

## 2022-09-09 DIAGNOSIS — I471 Supraventricular tachycardia, unspecified: Secondary | ICD-10-CM

## 2022-09-09 DIAGNOSIS — M6289 Other specified disorders of muscle: Secondary | ICD-10-CM | POA: Diagnosis not present

## 2022-09-09 DIAGNOSIS — E785 Hyperlipidemia, unspecified: Secondary | ICD-10-CM | POA: Diagnosis not present

## 2022-09-09 DIAGNOSIS — R0602 Shortness of breath: Secondary | ICD-10-CM

## 2022-09-09 NOTE — Patient Instructions (Signed)
Medication Instructions:   Your physician recommends that you continue on your current medications as directed. Please refer to the Current Medication list given to you today.   *If you need a refill on your cardiac medications before your next appointment, please call your pharmacy*   Lab Work:  None ordered.  If you have labs (blood work) drawn today and your tests are completely normal, you will receive your results only by: MyChart Message (if you have MyChart) OR A paper copy in the mail If you have any lab test that is abnormal or we need to change your treatment, we will call you to review the results.   Testing/Procedures:  None ordered.   Follow-Up: At [facility], you and your health needs are our priority.  As part of our continuing mission to provide you with exceptional heart care, we have created designated Provider Care Teams.  These Care Teams include your primary Cardiologist (physician) and Advanced Practice Providers (APPs -  Physician Assistants and Nurse Practitioners) who all work together to provide you with the care you need, when you need it.  We recommend signing up for the patient portal called "MyChart".  Sign up information is provided on this After Visit Summary.  MyChart is used to connect with patients for Virtual Visits (Telemedicine).  Patients are able to view lab/test results, encounter notes, upcoming appointments, etc.  Non-urgent messages can be sent to your provider as well.   To learn more about what you can do with MyChart, go to [email or web address].    Your next appointment:   1 month(s)  Provider:   Dr. Laurance Flatten

## 2022-09-10 NOTE — Progress Notes (Unsigned)
[name] D.[name] [address] [place] [address] Phone: [phone]   Assessment and Plan:     There are no diagnoses linked to this encounter.  ***   Pertinent previous records reviewed include ***   Follow Up: ***     Subjective:   I, [name], am serving as a Neurosurgeon for Doctor Richardean Sale  Chief Complaint: OMT follow up    HPI:  03/23/21 Patient with chronic back/neck pain hoping to avoid surgical intervention.   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 She feels like she is doing better. Meloxicam is helping    05/25/2021 Patient states that she's okay will hopefully be getting a CT scan soon , just here for some maintenance     06/25/2021 Patient states that she's feeling pretty good isn't in as much pain as she had been having still stiff at times needs her adjustment    07/23/2021 Patient states time for a tune up, neck and shoulder are tight giving her a little bit of a headache    08/13/2021 Patient states that she is doing the neck and shoulder are tight still getting a headache   5/16/023 Patient states she is doing pain is intermittent but over all better    10/02/2021 Patient states time for a tune up    11/01/2021 Patient states that she is a little tight this week    12/04/2021 Patient states the pain isnt as intense , has been in [place]    01/01/2022 Patient states ready for  tune and some    01/17/2022 Patient states the knee is great , hip is better , here for adjustment in the neck and shoulders    02/08/2022 Patient states she is in pain , neck shoulder and lower back    03/11/2022 Patient states that pain in her right knee is back    03/18/2022 Patient states right side pain    03/22/2022 Patient states that her back wont cooperate , when she is sitting or laying she will get pain from her knees up to buttock that feels like a burning , tylenol is not working , no MOI, she doesn't know if its coming from her knees  or from her lower back , she is able to do silver sneaker , but sitting and driving is painful    [id number]/47/8295 Patient states she's doing had MRI    04/08/2022 Patient states will give her the phone number to [facility] , here for adjustment    05/10/2022 Patient states neck is acting up    05/21/2022 Patient states right sided body pain    06/07/2022 Patient states that right hip and sometimes knee, Wednesday was a bad day , better but still doesn't know what to do long term    06/27/2022 Patient states hip is better, but her calf is sore and sharp pain when she sits and lays on her side    08/30/2022 Patient states right knee is flared thinks its time for MRI   09/11/2022 Patient states    Relevant Historical Information: Hashimoto's, DDD  lumbar spine, history of SVT  Additional pertinent review of systems negative.   Current Outpatient Medications:    Cholecalciferol (VITAMIN D-3) 125 MCG (5000 UT) TABS, Take 5,000 Units by mouth See admin instructions. Monday and Friday, Disp: 30 tablet, Rfl:    estradiol (ESTRACE VAGINAL) 0.1 MG/GM vaginal cream, Place 1 Applicatorful vaginally 3 (three) times a week., Disp: 42.5 g, Rfl: 1   fluticasone (FLONASE) 50 MCG/ACT nasal spray, Place 2 sprays into both nostrils daily., Disp: 16 g, Rfl: 0   levothyroxine (SYNTHROID) 75 MCG tablet, Take 75 mcg by mouth daily before breakfast., Disp: , Rfl:    losartan (COZAAR) 25 MG tablet, Take 1 tablet (25 mg total) by mouth daily., Disp: 90 tablet, Rfl: 2   meloxicam (MOBIC) 7.5 MG  tablet, PLEASE SEE ATTACHED FOR DETAILED DIRECTIONS, Disp: , Rfl:    metoprolol succinate (TOPROL-XL) 50 MG 24 hr tablet, TAKE 1 TABLET BY MOUTH EVERY DAY WITH OR IMMEDIATELY FOLLOWING A MEAL, Disp: 90 tablet, Rfl: 3   Multiple Vitamin (MULTIVITAMIN WITH MINERALS) TABS tablet, Take 1 tablet by mouth daily., Disp: , Rfl:    omeprazole (PRILOSEC) 40 MG capsule, Take 1 capsule (40 mg total) by mouth daily. (Patient taking differently: Take 40 mg by mouth every other day.), Disp: 30 capsule, Rfl: 11   polyethylene glycol (MIRALAX / GLYCOLAX) packet, Take 17 g by mouth daily as needed for mild constipation. , Disp: , Rfl:    Polyvinyl Alcohol-Povidone PF (REFRESH) 1.4-0.6 % SOLN, Apply 1 drop to eye 3 (three) times daily as needed (for dry eyes)., Disp: , Rfl:    pravastatin (PRAVACHOL) 20 MG tablet, Take 1 tablet (20 mg total) by mouth every evening., Disp: 30 tablet, Rfl: 11   Probiotic Product (PROBIOTIC ADVANCED PO), Take 1 capsule by mouth daily. , Disp: , Rfl:    tretinoin (RETIN-A) 0.05 % cream, Apply topically at bedtime., Disp: , Rfl:    White Petrolatum-Mineral Oil (SYSTANE NIGHTTIME) OINT, SMARTSIG:1 Inch(es) In Eye(s) Every Night, Disp: , Rfl:    Objective:     There were no vitals filed for this visit.    There is no height or weight on file to calculate BMI.    Physical Exam:    ***   Electronically signed by:  [name] D.[name] 3:29 PM 09/10/22

## 2022-09-11 ENCOUNTER — Encounter: Payer: Self-pay | Admitting: Cardiology

## 2022-09-11 ENCOUNTER — Other Ambulatory Visit: Payer: Self-pay

## 2022-09-11 ENCOUNTER — Ambulatory Visit: Payer: Medicare HMO | Admitting: Sports Medicine

## 2022-09-11 ENCOUNTER — Other Ambulatory Visit: Payer: Self-pay | Admitting: Sports Medicine

## 2022-09-11 VITALS — Ht 63.0 in | Wt 166.0 lb

## 2022-09-11 DIAGNOSIS — S83281A Other tear of lateral meniscus, current injury, right knee, initial encounter: Secondary | ICD-10-CM | POA: Diagnosis not present

## 2022-09-11 DIAGNOSIS — G8929 Other chronic pain: Secondary | ICD-10-CM

## 2022-09-11 DIAGNOSIS — M25561 Pain in right knee: Secondary | ICD-10-CM

## 2022-09-11 DIAGNOSIS — M1711 Unilateral primary osteoarthritis, right knee: Secondary | ICD-10-CM | POA: Diagnosis not present

## 2022-09-11 MED ORDER — ZILRETTA 32 MG IX SRER: 32.0000 mg | Freq: Once | INTRA_ARTICULAR | 0 refills | Status: DC

## 2022-09-11 NOTE — Progress Notes (Signed)
Zilretta was sent to pharmacy

## 2022-09-11 NOTE — Progress Notes (Signed)
Received a fax from [facility] had to send the medication to specialty pharmacy [facility]

## 2022-09-12 MED ORDER — ZILRETTA 32 MG IX SRER: 32.0000 mg | Freq: Once | INTRA_ARTICULAR | 0 refills | Status: DC

## 2022-09-12 NOTE — Addendum Note (Signed)
Addended by: Evon Slack on: 09/12/2022 07:45 AM   Modules accepted: Orders

## 2022-09-18 ENCOUNTER — Telehealth: Payer: Self-pay | Admitting: Sports Medicine

## 2022-09-18 NOTE — Telephone Encounter (Signed)
Voicemail left after hours from [facility] asking for a call back in regards to this patient's prescription.  (I am assuming it is the Zilretta that was sent to them?)  Please advise.  Call back # [phone]

## 2022-09-18 NOTE — Progress Notes (Deleted)
Aleen Sells D.[name] [address] [place] [address] Phone: [phone]   Assessment and Plan:     There are no diagnoses linked to this encounter.  ***   Pertinent previous records reviewed include ***   Follow Up: ***     Subjective:   I, [name], am serving as a Neurosurgeon for Doctor Richardean Sale  Chief Complaint: OMT follow up    HPI:  03/23/21 Patient with chronic back/neck pain hoping to avoid surgical intervention.   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 She feels like she is doing better. Meloxicam is helping    05/25/2021 Patient states that she's okay will hopefully be getting a CT scan soon , just here for some maintenance     06/25/2021 Patient states that she's feeling pretty good isn't in as much pain as she had been having still stiff at times needs her adjustment    07/23/2021 Patient states time for a tune up, neck and shoulder are tight giving her a little bit of a headache    08/13/2021 Patient states that she is doing the neck and shoulder are tight still getting a headache   5/16/023 Patient states she is doing pain is intermittent but over all better    10/02/2021 Patient states time for a tune up    11/01/2021 Patient states that she is a little tight this week    12/04/2021 Patient states the pain isnt as intense , has been in [place]    01/01/2022 Patient states ready for  tune and some    01/17/2022 Patient states the knee is great , hip is better , here for adjustment in the neck and shoulders    02/08/2022 Patient states she is in pain , neck shoulder and lower back    03/11/2022 Patient states that pain in her right knee is back    03/18/2022 Patient states right side pain    03/22/2022 Patient states that her back wont cooperate , when she is sitting or laying she will get pain from her knees up to buttock that feels like a burning , tylenol is not working , no MOI, she doesn't know if its coming from her knees  or from her lower back , she is able to do silver sneaker , but sitting and driving is painful    91/47/8295 Patient states she's doing had MRI    04/08/2022 Patient states will give her the phone number to [facility] , here for adjustment    05/10/2022 Patient states neck is acting up    05/21/2022 Patient states right sided body pain    06/07/2022 Patient states that right hip and sometimes knee, Wednesday was a bad day , better but still doesn't know what to do long term    06/27/2022 Patient states hip is better, but her calf is sore and sharp pain when she sits and lays on her side    08/30/2022 Patient states right knee is flared thinks its time for MRI   09/11/2022 Patient states wants to go over MRI results , she  has pain when she sleeps and pain with flexion    09/25/2022 Patient states    Relevant Historical Information: Hashimoto's, DDD lumbar spine, history of SVT  Additional pertinent review of systems negative.   Current Outpatient Medications:    Cholecalciferol (VITAMIN D-3) 125 MCG (5000 UT) TABS, Take 5,000 Units by mouth See admin instructions. Monday and Friday, Disp: 30 tablet, Rfl:    estradiol (ESTRACE VAGINAL) 0.1 MG/GM vaginal cream, Place 1 Applicatorful vaginally 3 (three) times a week., Disp: 42.5 g, Rfl: 1   fluticasone (FLONASE) 50 MCG/ACT nasal spray, Place 2 sprays into both nostrils daily., Disp: 16 g, Rfl: 0   levothyroxine (SYNTHROID) 75 MCG tablet, Take 75 mcg by mouth daily before breakfast., Disp: , Rfl:    losartan (COZAAR)  25 MG tablet, Take 1 tablet (25 mg total) by mouth daily., Disp: 90 tablet, Rfl: 2   meloxicam (MOBIC) 7.5 MG tablet, PLEASE SEE ATTACHED FOR DETAILED DIRECTIONS, Disp: , Rfl:    metoprolol succinate (TOPROL-XL) 50 MG 24 hr tablet, TAKE 1 TABLET BY MOUTH EVERY DAY WITH OR IMMEDIATELY FOLLOWING A MEAL, Disp: 90 tablet, Rfl: 3   Multiple Vitamin (MULTIVITAMIN WITH MINERALS) TABS tablet, Take 1 tablet by mouth daily., Disp: , Rfl:    omeprazole (PRILOSEC) 40 MG capsule, Take 1 capsule (40 mg total) by mouth daily. (Patient taking differently: Take 40 mg by mouth every other day.), Disp: 30 capsule, Rfl: 11   polyethylene glycol (MIRALAX / GLYCOLAX) packet, Take 17 g by mouth daily as needed for mild constipation. , Disp: , Rfl:    Polyvinyl Alcohol-Povidone PF (REFRESH) 1.4-0.6 % SOLN, Apply 1 drop to eye 3 (three) times daily as needed (for dry eyes)., Disp: , Rfl:    pravastatin (PRAVACHOL) 20 MG tablet, Take 1 tablet (20 mg total) by mouth every evening., Disp: 30 tablet, Rfl: 11   Probiotic Product (PROBIOTIC ADVANCED PO), Take 1 capsule by mouth daily. , Disp: , Rfl:    tretinoin (RETIN-A) 0.05 % cream, Apply topically at bedtime., Disp: , Rfl:    White Petrolatum-Mineral Oil (SYSTANE NIGHTTIME) OINT, SMARTSIG:1 Inch(es) In Eye(s) Every Night, Disp: , Rfl:    Objective:     There were no vitals filed for this visit.    There is no height or weight on file to calculate BMI.    Physical Exam:    ***   Electronically signed by:  Aleen Sells D.[name] 7:36 AM 09/18/22

## 2022-09-18 NOTE — Telephone Encounter (Signed)
Left voicemail for pharmacy to call me back in regard to the zilretta

## 2022-09-20 ENCOUNTER — Telehealth: Payer: Self-pay | Admitting: *Deleted

## 2022-09-20 DIAGNOSIS — Z79899 Other long term (current) drug therapy: Secondary | ICD-10-CM

## 2022-09-20 DIAGNOSIS — E785 Hyperlipidemia, unspecified: Secondary | ICD-10-CM

## 2022-09-20 MED ORDER — PRAVASTATIN SODIUM 40 MG PO TABS: 40.0000 mg | ORAL_TABLET | Freq: Every evening | ORAL | 2 refills | Status: DC

## 2022-09-20 NOTE — Telephone Encounter (Signed)
-----   Message from Meriam Sprague, MD sent at 09/20/2022  1:26 PM EDT ----- Cholesterol is above goal with LDL 123. Is she willing to bump the pravastatin to 40mg  daily?

## 2022-09-20 NOTE — Telephone Encounter (Addendum)
Patient called to follow up to see if we have received these injections? She was under the impression that she would be getting it at her visit? I told her that I have not seen them and would check on the status of this.  Please advise.

## 2022-09-20 NOTE — Telephone Encounter (Signed)
The patient has been notified of the result and verbalized understanding.  All questions (if any) were answered.  Pt agreed to increase her pravastatin to 40 mg po daily.  She states she will double up on her current supply and call the pharmacy when she is due for her next fill.  Note placed about this in pharmacy comments.  Scheduled the pt for repeat lipids in 3 months for 12/20/22.  She is aware to come fasting to this lab appt.   Confirmed the pharmacy of choice with the pt.   Pt verbalized understanding and agrees with this plan.

## 2022-09-23 NOTE — Telephone Encounter (Signed)
Called [facility] they needed the ICD10 code. I gave them a code they will call patient to get the approval to ship and will call us back when this is done.

## 2022-09-25 ENCOUNTER — Ambulatory Visit: Payer: Medicare HMO | Admitting: Sports Medicine

## 2022-09-25 ENCOUNTER — Telehealth: Payer: Self-pay | Admitting: Sports Medicine

## 2022-09-25 NOTE — Telephone Encounter (Signed)
[facility] called about Zilretta. Home delivery was delayed so they are sending it Fedex Priority for delivery to [place] 6/6 AM.

## 2022-09-26 ENCOUNTER — Ambulatory Visit: Payer: Medicare HMO | Admitting: Sports Medicine

## 2022-09-26 NOTE — Progress Notes (Signed)
[name] D.[name] [address] [place] [address] Phone: [phone]   Assessment and Plan:     1. Chronic pain of right knee 2. Primary osteoarthritis of right knee  -Chronic with exacerbation, subsequent visit - Patient presents today for Zilretta injection procedure only visit.  Tolerated well per note below - Patient has MRI which shows lateral meniscal tear of right knee which is consistent with patient's symptoms.  We will attempt to continue with conservative therapy including HEP, physical therapy, Zilretta versus CSI  Procedure: Knee Joint Injection Side: Right Indication: Flare of osteoarthritis and meniscal tear  Risks explained and consent was given verbally. The site was cleaned with alcohol prep. A needle was introduced with an anterio-lateral approach. Injection given using Zilretta 32 mg. This was well tolerated and resulted in symptomatic relief.  Needle was removed, hemostasis achieved, and post injection instructions were explained.   Pt was advised to call or return to clinic if these symptoms worsen or fail to improve as anticipated.   Pertinent previous records reviewed include none   Follow Up: As needed   Subjective:   I, Jerene Canny, am serving as a Neurosurgeon for Doctor Richardean Sale  Chief Complaint: OMT follow up    HPI:  03/23/21 Patient with chronic back/neck pain hoping to avoid surgical intervention.   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 She feels like she is doing better. Meloxicam is helping     05/25/2021 Patient states that she's okay will hopefully be getting a CT scan soon , just here for some maintenance     06/25/2021 Patient states that she's feeling pretty good isn't in as much pain as she had been having still stiff at times needs her adjustment    07/23/2021 Patient states time for a tune up, neck and shoulder are tight giving her a little bit of a headache    08/13/2021 Patient states that she is doing the neck and shoulder are tight still getting a headache   5/16/023 Patient states she is doing pain is intermittent but over all better    10/02/2021 Patient states time for a tune up    11/01/2021 Patient states that she is a little tight this week    12/04/2021 Patient states the pain isnt as intense , has been in [place]    01/01/2022 Patient states ready for  tune and some    01/17/2022 Patient states the knee is great , hip is better , here for adjustment in the neck and shoulders   02/08/2022 Patient states she is in pain , neck shoulder and lower back    03/11/2022 Patient states that pain in her right knee is back    03/18/2022 Patient states right side pain    03/22/2022 Patient states that her back wont cooperate , when she is sitting or laying she will get pain from her knees up to buttock that feels like a burning , tylenol is not working , no MOI, she doesn't know if its coming from her knees  or from her lower back , she  is able to do silver sneaker , but sitting and driving is painful    40/98/1191 Patient states she's doing had MRI    04/08/2022 Patient states will give her the phone number to [facility] , here for adjustment    05/10/2022 Patient states neck is acting up    05/21/2022 Patient states right sided body pain    06/07/2022 Patient states that right hip and sometimes knee, Wednesday was a bad day , better but still doesn't know what to do long term    06/27/2022 Patient states hip is better, but her calf is sore and sharp pain when  she sits and lays on her side    08/30/2022 Patient states right knee is flared thinks its time for MRI   09/11/2022 Patient states wants to go over MRI results , she has pain when she sleeps and pain with flexion   09/27/2022 Patient states     Relevant Historical Information: Hashimoto's, DDD lumbar spine, history of SVT  Additional pertinent review of systems negative.   Current Outpatient Medications:    Cholecalciferol (VITAMIN D-3) 125 MCG (5000 UT) TABS, Take 5,000 Units by mouth See admin instructions. Monday and Friday, Disp: 30 tablet, Rfl:    estradiol (ESTRACE VAGINAL) 0.1 MG/GM vaginal cream, Place 1 Applicatorful vaginally 3 (three) times a week., Disp: 42.5 g, Rfl: 1   fluticasone (FLONASE) 50 MCG/ACT nasal spray, Place 2 sprays into both nostrils daily., Disp: 16 g, Rfl: 0   levothyroxine (SYNTHROID) 75 MCG tablet, Take 75 mcg by mouth daily before breakfast., Disp: , Rfl:    losartan (COZAAR) 25 MG tablet, Take 1 tablet (25 mg total) by mouth daily., Disp: 90 tablet, Rfl: 2   meloxicam (MOBIC) 7.5 MG tablet, PLEASE SEE ATTACHED FOR DETAILED DIRECTIONS, Disp: , Rfl:    metoprolol succinate (TOPROL-XL) 50 MG 24 hr tablet, TAKE 1 TABLET BY MOUTH EVERY DAY WITH OR IMMEDIATELY FOLLOWING A MEAL, Disp: 90 tablet, Rfl: 3   Multiple Vitamin (MULTIVITAMIN WITH MINERALS) TABS tablet, Take 1 tablet by mouth daily., Disp: , Rfl:    omeprazole (PRILOSEC) 40 MG capsule, Take 1 capsule (40 mg total) by mouth daily. (Patient taking differently: Take 40 mg by mouth every other day.), Disp: 30 capsule, Rfl: 11   polyethylene glycol (MIRALAX / GLYCOLAX) packet, Take 17 g by mouth daily as needed for mild constipation. , Disp: , Rfl:    Polyvinyl Alcohol-Povidone PF (REFRESH) 1.4-0.6 % SOLN, Apply 1 drop to eye 3 (three) times daily as needed (for dry eyes)., Disp: , Rfl:    pravastatin (PRAVACHOL) 40 MG tablet, Take 1 tablet (40 mg total) by mouth every evening., Disp: 90 tablet, Rfl: 2    Probiotic Product (PROBIOTIC ADVANCED PO), Take 1 capsule by mouth daily. , Disp: , Rfl:    tretinoin (RETIN-A) 0.05 % cream, Apply topically at bedtime., Disp: , Rfl:    White Petrolatum-Mineral Oil (SYSTANE NIGHTTIME) OINT, SMARTSIG:1 Inch(es) In Eye(s) Every Night, Disp: , Rfl:        Electronically signed by:  [name] D.[name] 11:03 AM 09/27/22

## 2022-09-27 ENCOUNTER — Ambulatory Visit: Payer: Medicare HMO | Admitting: Sports Medicine

## 2022-09-27 DIAGNOSIS — G8929 Other chronic pain: Secondary | ICD-10-CM | POA: Diagnosis not present

## 2022-09-27 DIAGNOSIS — M1711 Unilateral primary osteoarthritis, right knee: Secondary | ICD-10-CM

## 2022-09-27 DIAGNOSIS — M25561 Pain in right knee: Secondary | ICD-10-CM

## 2022-09-27 MED ORDER — TRIAMCINOLONE ACETONIDE 32 MG IX SRER
32.0000 mg | Freq: Once | INTRA_ARTICULAR | Status: AC
  Administered 2022-09-27: 32 mg via INTRA_ARTICULAR

## 2022-09-30 IMAGING — CT CT ABD-PELV W/ CM
2 of 5 series · 16 of 46 positions shown, 18 images · IV contrast (APPLIED)
Comparison: CT abdomen dated 10/15/2019

CLINICAL DATA: Abdominal pain.

EXAM:
CT ABDOMEN AND PELVIS WITH CONTRAST
TECHNIQUE: Multidetector CT imaging of the abdomen and pelvis was performed
using the standard protocol following bolus administration of
intravenous contrast.

[Series 2: routine abd/pel with · axial · 0.93mm/px · z∈[-1021,-646]mm · 13 of 85 slices shown, 15 images]
[im 5/85  soft-tissue]
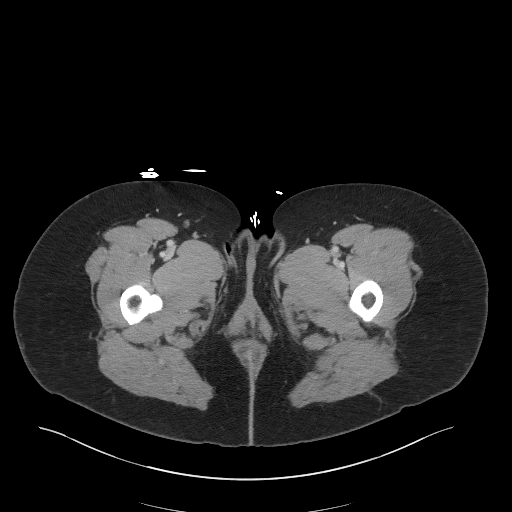
[im 5/85  bone]
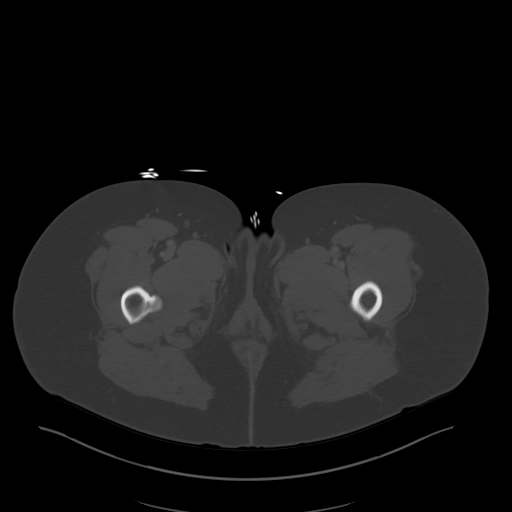
[im 10/85  soft-tissue]
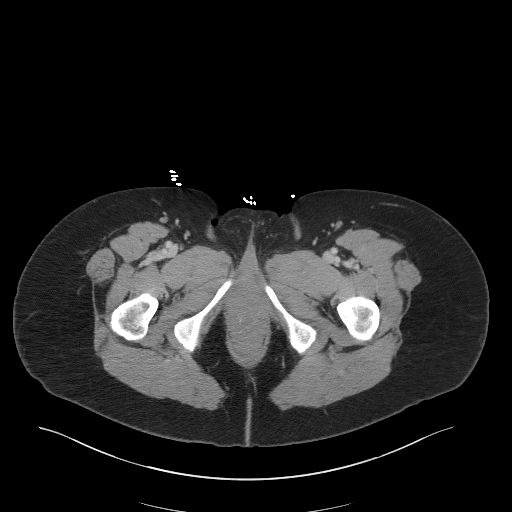
[im 19/85  soft-tissue]
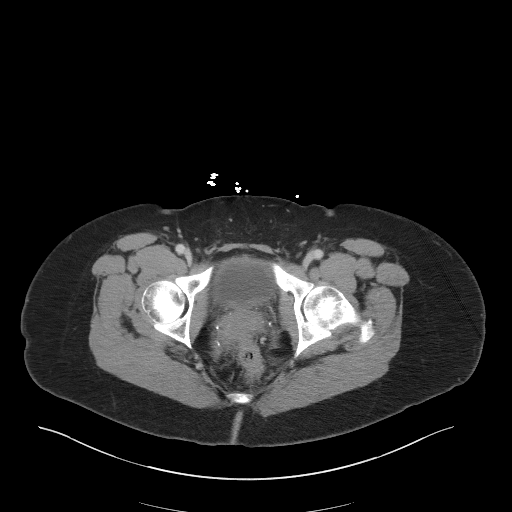
[im 24/85  soft-tissue]
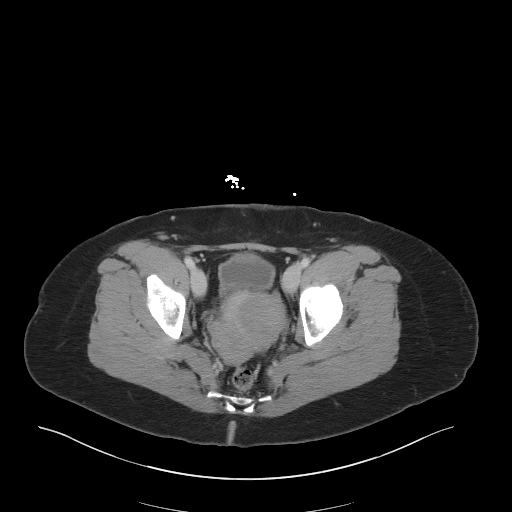
[im 29/85  soft-tissue]
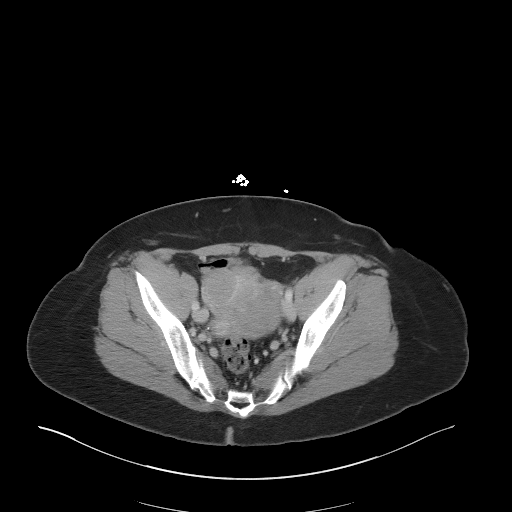
[im 38/85  soft-tissue]
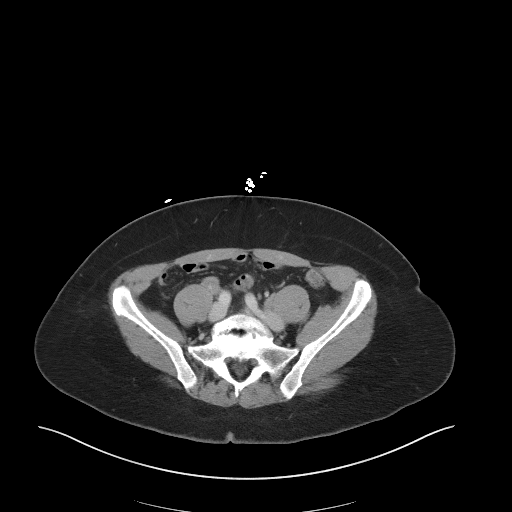
[im 43/85  soft-tissue]
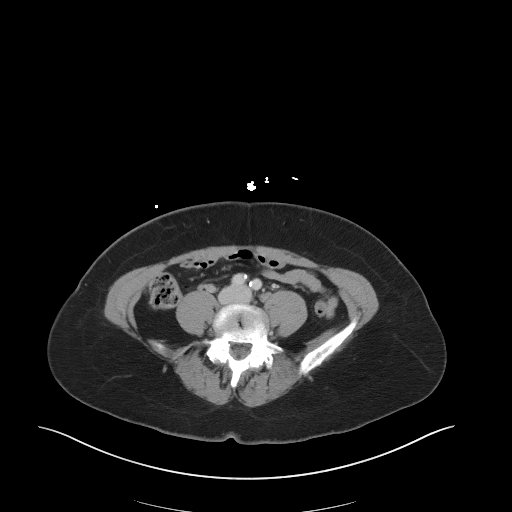
[im 47/85  soft-tissue]
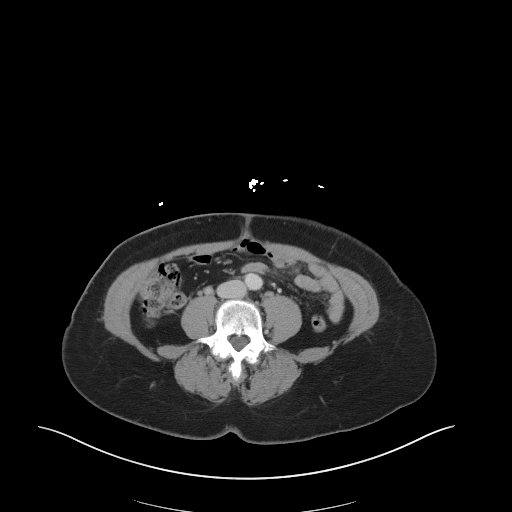
[im 57/85  soft-tissue]
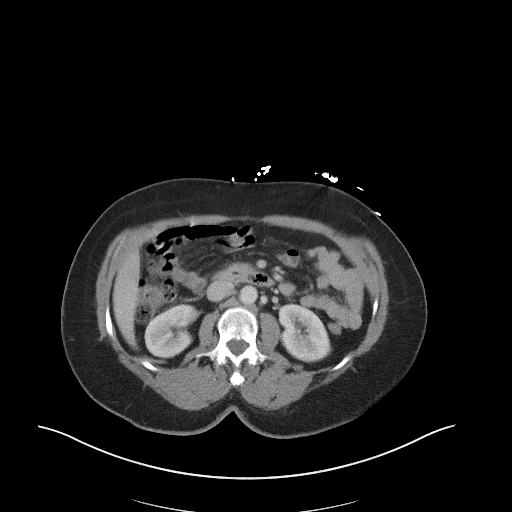
[im 57/85  bone]
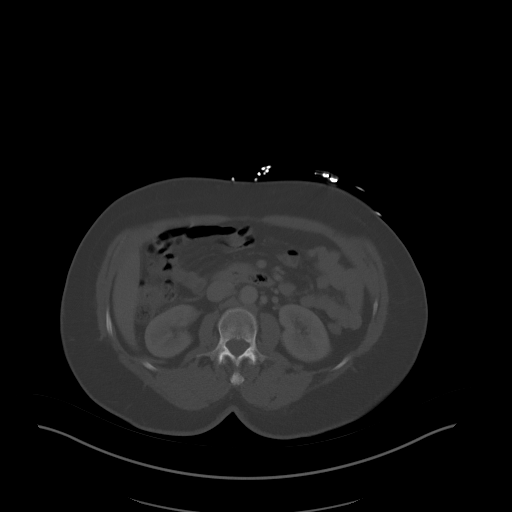
[im 61/85  soft-tissue]
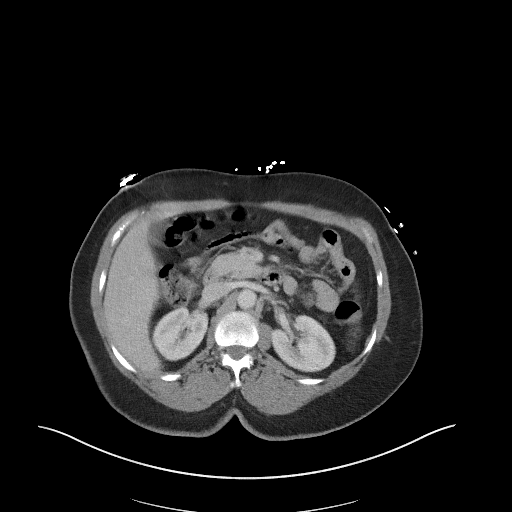
[im 66/85  soft-tissue]
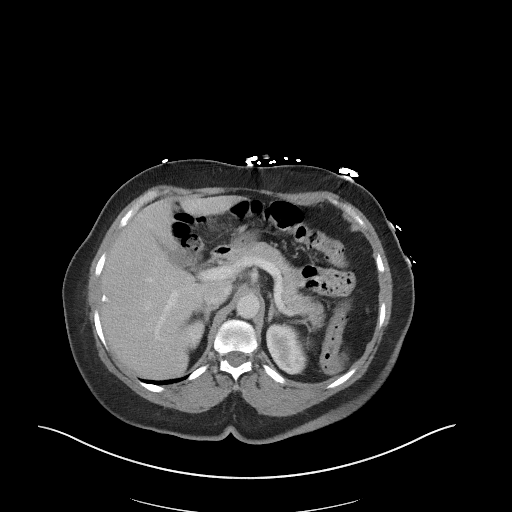
[im 75/85  soft-tissue]
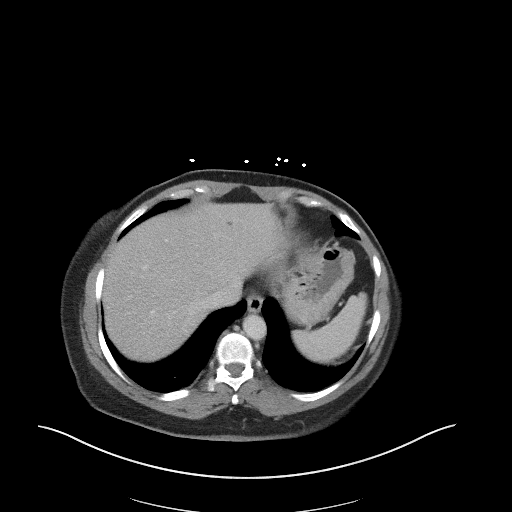
[im 80/85  soft-tissue]
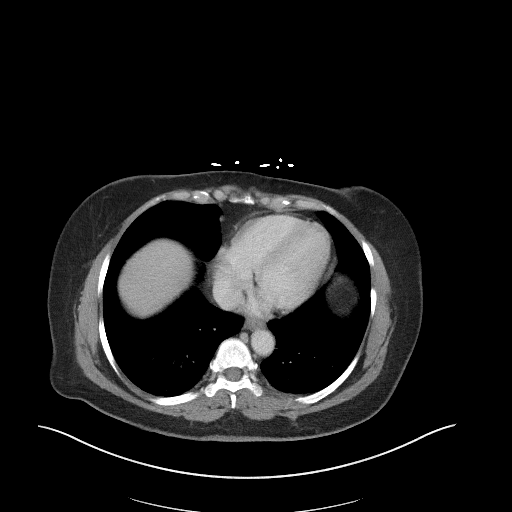

[Series 5: coronal st · coronal · 0.75mm/px · 3 of 88 slices shown]
[im 30/88  soft-tissue]
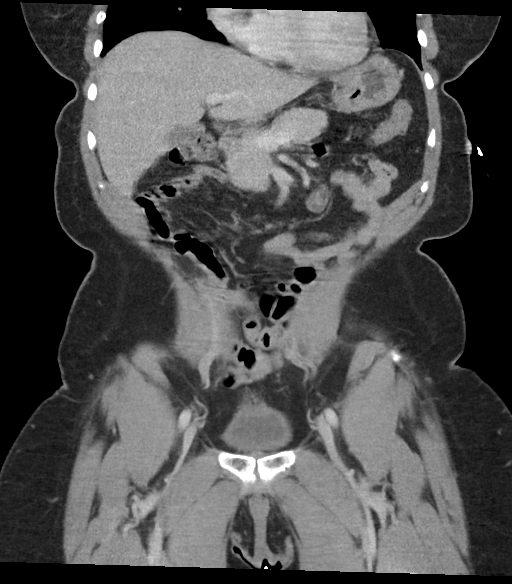
[im 39/88  soft-tissue]
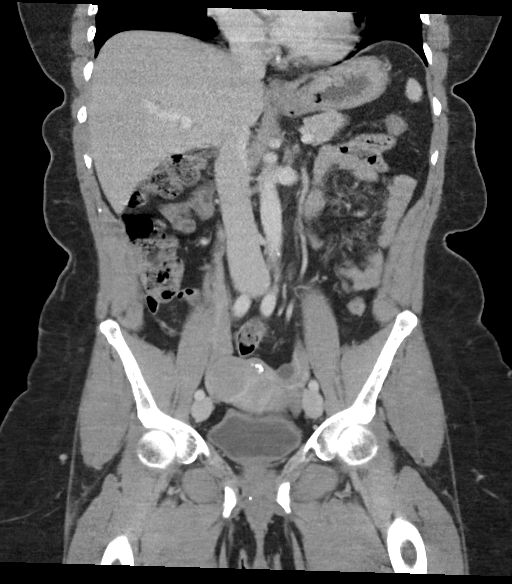
[im 49/88  soft-tissue]
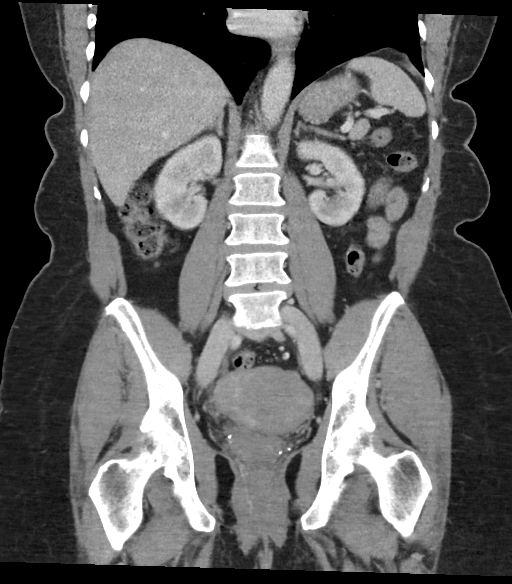

[16 of 46 positions shown; findings below may reference images not displayed]

RADIATION DOSE REDUCTION: This exam was performed according to the
departmental dose-optimization program which includes automated
exposure control, adjustment of the mA and/or kV according to
patient size and/or use of iterative reconstruction technique.

CONTRAST:  100mL OMNIPAQUE IOHEXOL 300 MG/ML  SOLN
FINDINGS: Lower chest: No acute abnormality.

Hepatobiliary: Stable small hepatic cysts. No new or suspicious
findings within the liver. Gallbladder appears normal. No bile duct
dilatation seen.

Pancreas: Unremarkable. No pancreatic ductal dilatation or
surrounding inflammatory changes.

Spleen: Normal in size without focal abnormality.

Adrenals/Urinary Tract: Adrenal glands appear normal. Kidneys are
unremarkable without mass, stone or hydronephrosis. No perinephric
fluid. No ureteral or bladder calculi are identified. Bladder
appears normal, partially decompressed.

Stomach/Bowel: No dilated large or small bowel loops. No evidence of
bowel wall inflammation. Appendix is not convincingly seen but there
are no inflammatory changes about the cecum to suggest acute
appendicitis. Stomach is unremarkable.

Vascular/Lymphatic: Aortic atherosclerosis. No abdominal aortic
aneurysm. No acute-appearing vascular abnormality. No enlarged lymph
nodes seen in the abdomen or pelvis.

Reproductive: Leiomyomatous uterus. No adnexal mass or free fluid
seen.

Other: No free fluid or abscess collection seen. No free
intraperitoneal air.

Musculoskeletal: Degenerative spondylosis within the lower lumbar
spine, at least moderate in degree at the lumbosacral junction with
associated disc desiccation and articular surface sclerosis. No
acute-appearing osseous abnormality.
IMPRESSION: 1. No acute findings within the abdomen or pelvis. No bowel
obstruction or evidence of bowel wall inflammation. No evidence of
acute solid organ abnormality. No renal or ureteral calculi.
2. Leiomyomatous uterus.
3. Degenerative spondylosis within the lower lumbar spine, at least
moderate in degree at the lumbosacral junction.

Aortic Atherosclerosis (M698B-[id number]).

## 2022-09-30 IMAGING — CR DG CHEST 2V
2 series · 2 of 2 positions shown · non-contrast
Comparison: Chest radiographs 02/19/2021 and earlier.

CLINICAL DATA: 70-year-old female with right lower chest or upper
quadrant pain for 2 weeks. Weakness and palpitations this morning.

EXAM:
CHEST - 2 VIEW

[chest pa]
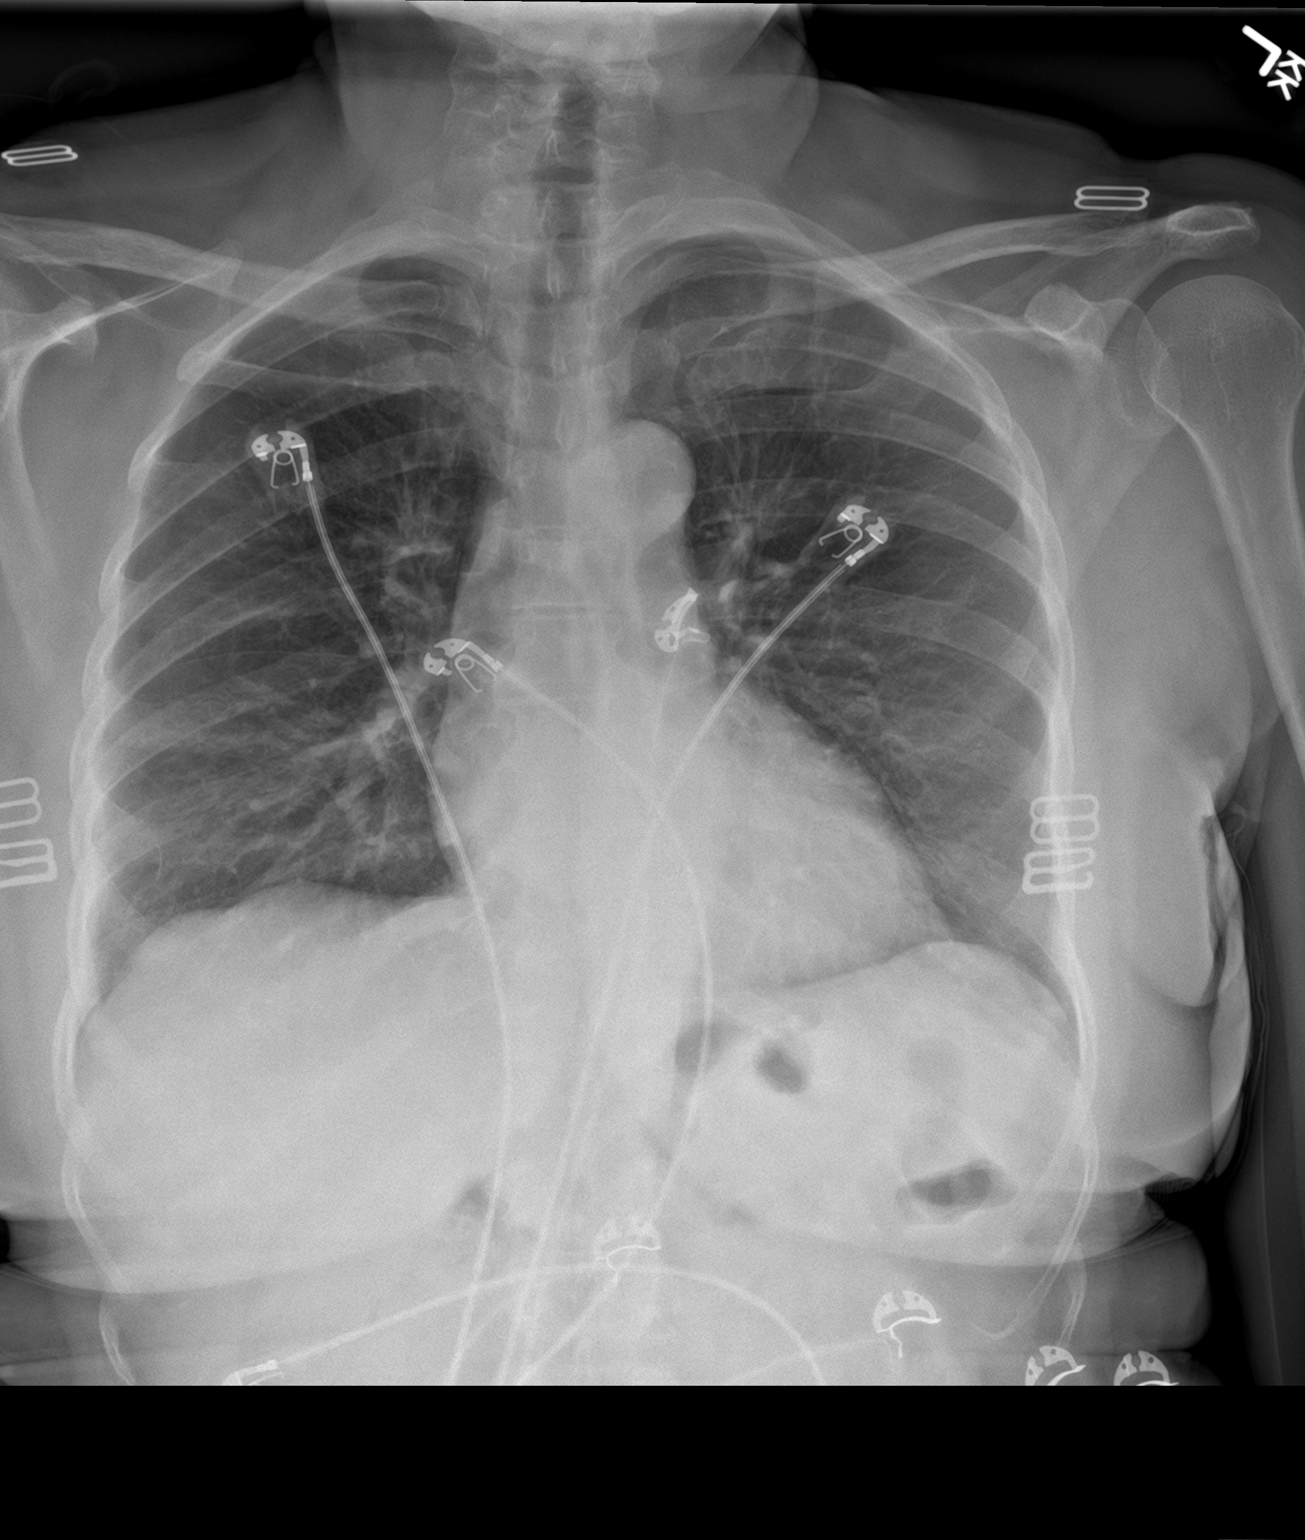

[chest lat]
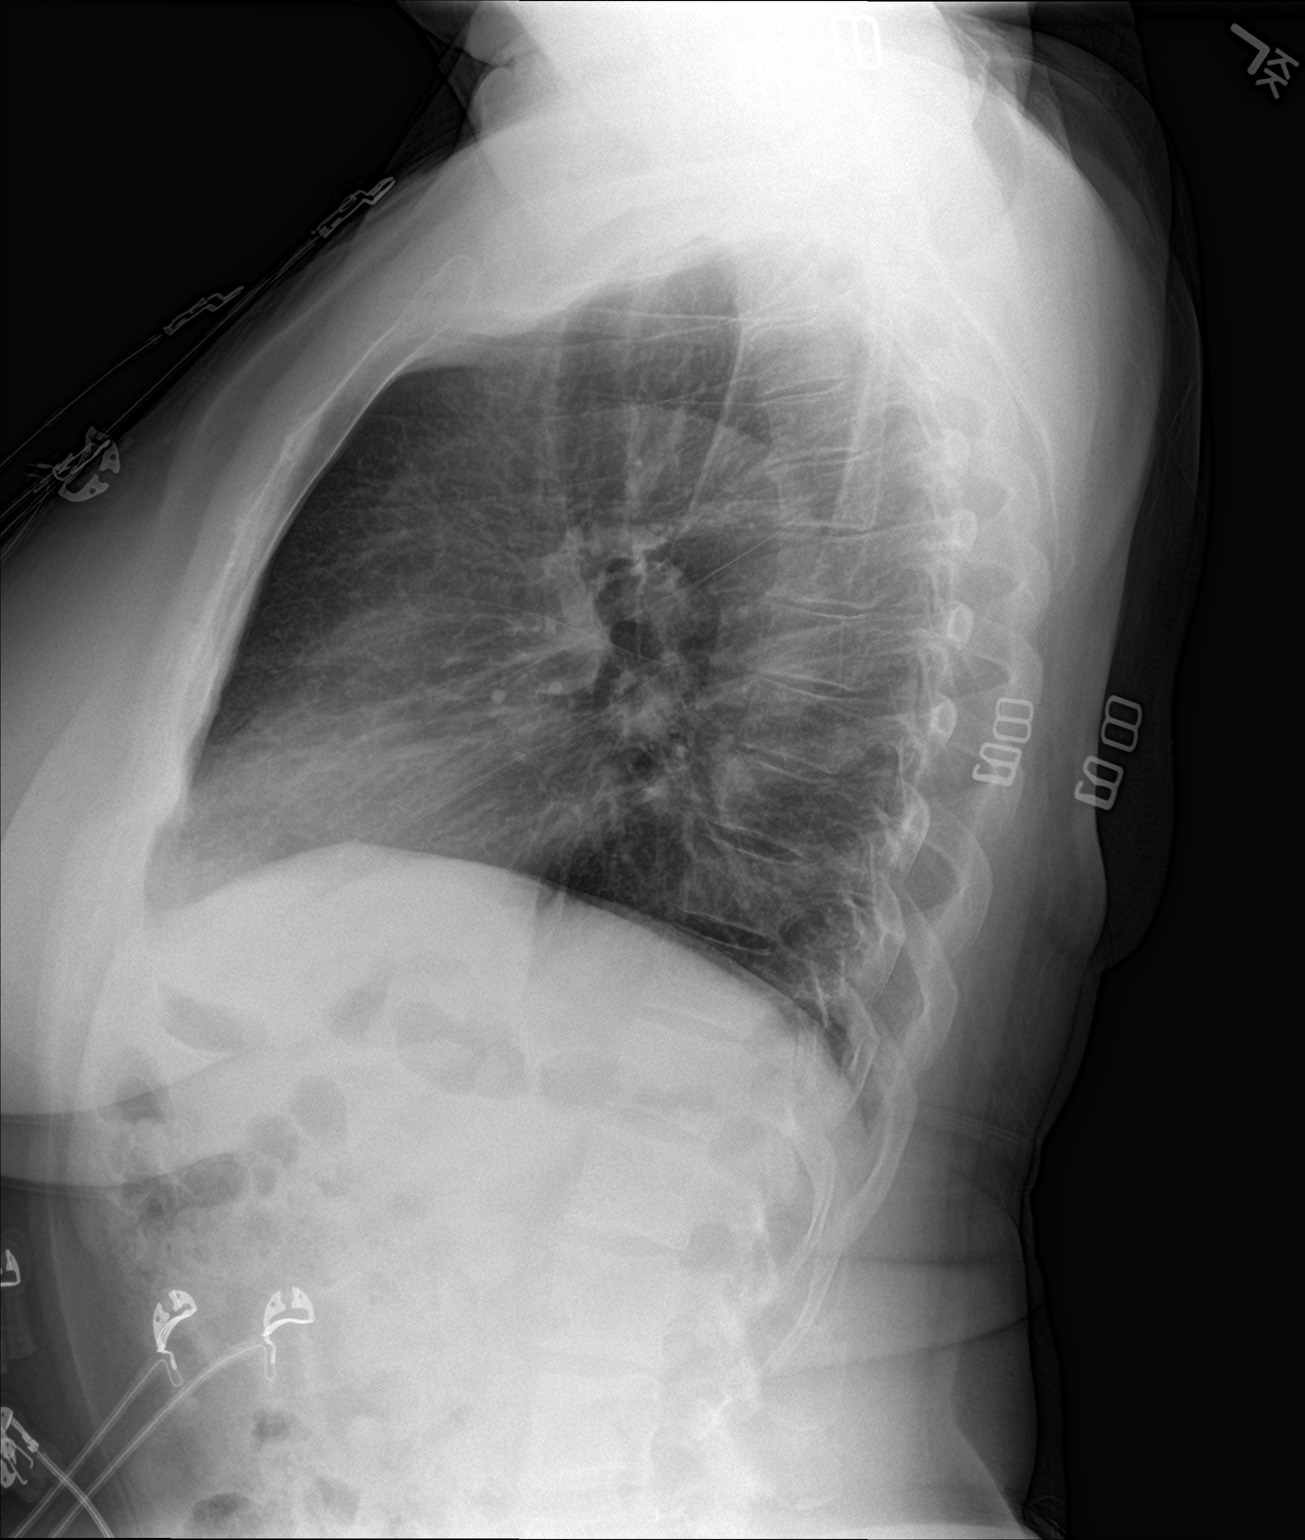

[2 of 2 positions shown; findings below may reference images not displayed]

FINDINGS: Lung volumes and mediastinal contours remain normal. Visualized
tracheal air column is within normal limits. Both lungs appear
stable and clear. No pneumothorax or pleural effusion. No
pneumoperitoneum. Negative visible bowel gas. No acute osseous
abnormality identified.
IMPRESSION: Stable, negative.  No acute cardiopulmonary abnormality.

## 2022-10-01 ENCOUNTER — Other Ambulatory Visit (HOSPITAL_COMMUNITY): Payer: Self-pay

## 2022-10-01 ENCOUNTER — Encounter: Payer: Self-pay | Admitting: Cardiology

## 2022-10-01 MED ORDER — NEXLIZET 180-10 MG PO TABS: 1.0000 | ORAL_TABLET | Freq: Every day | ORAL | 11 refills | Status: DC

## 2022-10-01 NOTE — Telephone Encounter (Signed)
Who is prescribing the Nexletol BID? Patient does not currently have an active script on file.

## 2022-10-01 NOTE — Telephone Encounter (Signed)
Per test claim she can get it without a PA  That was for a 90 day supply.

## 2022-10-08 NOTE — Progress Notes (Unsigned)
Cardiology Office Note:    Date:  10/15/2022   ID:  [name], DOB Oct 25, 1950, MRN [id number]  PCP:  Gweneth Dimitri, MD   [facility]  Cardiologist:  Armanda Magic, MD  Advanced Practice Provider:  No care team member to display Electrophysiologist:  Will Jorja Loa, MD    Referring MD: Gweneth Dimitri, MD    History of Present Illness:    [name] is a 72 y.o. female with a hx of HTN, HLD, paroxysmal SVT s/p ablation, and hypothyroidism who was previously followed by Dr. Mayford Knife who presents to for CV follow-up.  The patient has a long history of paroxysmal SVT. Underwent ablation for SVT at [facility] in 2001.  Since then she has had infrequent episodes of SVT.  Many of her episodes have occurred following surgical procedures after administration of anesthesia.  She has had an echocardiogram on 02/02/13 which showed normal left ventricular function with ejection fraction 55-60% and no significant abnormalities. Cardiac monitor 12/17/2017 showed normal sinus rhythm, sinus bradycardia, sinus tachycardia with some nonsustained atrial tachycardia up to 13 beats and occasional PVCs with PVC burden of 1%.  Heart rate ranged from 42 to 185 bpm. Has been followed by Dr. Elberta Fortis with last visit in 2020 where she was doing well and was continued on her metoprolol.  During visit on 06/19/20, the patient was having elevated blood pressures running 130s-170s. Also had episode of SVT in 04/2020. Following our visit on 06/23/20, she called the on-call line due to HR 142. She was instructed to take additional doses of her metop at that time. She was seen in EP clinic on 06/30/20 where she was recommended for possible ablation if symptoms recurred.  During visit on 09/21/20, the patient had another episode of SVT after a dental procedure.She went to [facility] where HR were mildly elevated at that time. Received IVF and toradol with resolution of  symptoms and was discharged home. Ca score was also obtained on 10/31/20 which was 0.   During visit on 06/07/2021, the patient was suffering with episodes of mouth dryness, fatigue and numbness and was being worked up for possible rheumatologic condition. She was also having brief runs of SVT at that time. TTE 06/15/21 for systolic murmur showed normal BiV function with no evidence of valve disease  Was last seen in clinic on 09/2022 by Dr. Elberta Fortis where she was doing well without significant CV symptoms.  Today, the patient overall feels okay. Continues to have myalgias but states she is tolerating the medications okay. Otherwise, she is doing well from a CV standpoint. No chest pain, SOB, orthopnea, PND, or significant palpitations. Remains active with no exertional symptoms. Continues to go to [employer] without issue.   Past Medical History:  Diagnosis Date   Arthritis    Neck   CIN I (cervical intraepithelial neoplasia I) 2017   Complication of anesthesia    delayed SVT:  can occur from a few hours to 3 days after the general anesthesia or conscious sedation as happened with her colonoscopy.    Dysplasia of cervix, low grade (CIN 1)    LEEP cervical conization September 2017 margins negative CIN-1   GERD (gastroesophageal reflux disease)    Hashimoto's thyroiditis    History of carpal tunnel syndrome    Left   History of colon polyps 2010   Benign   Hyperlipidemia    Hypothyroidism    Hashimoto's per pt    Osteopenia 10/2017  T score -1.9 FRAX 8.8% / 0.7%.  Stable from prior DEXA.   SVT (supraventricular tachycardia)    Wears glasses     Past Surgical History:  Procedure Laterality Date   BREAST SURGERY  1976   CYST REMOVED   CARDIAC ELECTROPHYSIOLOGY STUDY AND ABLATION  2001   CESAREAN SECTION  1987   COLONOSCOPY     COLONOSCOPY WITH PROPOFOL N/A 02/12/2019   Procedure: COLONOSCOPY WITH PROPOFOL;  Surgeon: Graylin Shiver, MD;  Location: [facility]COPY;  Service:  Endoscopy;  Laterality: N/A;   LEEP  2017   left thumb surgery and Carpal Tunnel Release  9/14   lower lid surgery   09/2018   dry eye from Hashimoto's   POLYPECTOMY  02/12/2019   Procedure: POLYPECTOMY;  Surgeon: Graylin Shiver, MD;  Location: [facility] ENDOSCOPY;  Service: Endoscopy;;    Current Medications: Current Meds  Medication Sig   Bempedoic Acid-Ezetimibe (NEXLIZET) 180-10 MG TABS Take 1 tablet by mouth daily.   Cholecalciferol (VITAMIN D-3) 125 MCG (5000 UT) TABS Take 5,000 Units by mouth See admin instructions. Monday and Friday   fluticasone (FLONASE) 50 MCG/ACT nasal spray Place 2 sprays into both nostrils daily.   levothyroxine (SYNTHROID) 75 MCG tablet Take 75 mcg by mouth daily before breakfast.   losartan (COZAAR) 25 MG tablet Take 1 tablet (25 mg total) by mouth daily.   metoprolol succinate (TOPROL-XL) 50 MG 24 hr tablet TAKE 1 TABLET BY MOUTH EVERY DAY WITH OR IMMEDIATELY FOLLOWING A MEAL   Multiple Vitamin (MULTIVITAMIN WITH MINERALS) TABS tablet Take 1 tablet by mouth daily.   omeprazole (PRILOSEC) 40 MG capsule Take 1 capsule (40 mg total) by mouth daily. (Patient taking differently: Take 40 mg by mouth every other day.)   polyethylene glycol (MIRALAX / GLYCOLAX) packet Take 17 g by mouth daily as needed for mild constipation.    Polyvinyl Alcohol-Povidone PF (REFRESH) 1.4-0.6 % SOLN Apply 1 drop to eye 3 (three) times daily as needed (for dry eyes).   pravastatin (PRAVACHOL) 40 MG tablet Take 1 tablet (40 mg total) by mouth every evening.   Probiotic Product (PROBIOTIC ADVANCED PO) Take 1 capsule by mouth daily.    tretinoin (RETIN-A) 0.05 % cream Apply topically at bedtime.   White Petrolatum-Mineral Oil (SYSTANE NIGHTTIME) OINT SMARTSIG:1 Inch(es) In Eye(s) Every Night     Allergies:   Atorvastatin, Diphenhydramine, Epinephrine, Praluent [alirocumab], and Repatha [evolocumab]   Social History   Socioeconomic History   Marital status: Widowed    Spouse name: Not  on file   Number of children: 1   Years of education: Not on file   Highest education level: Master's degree (e.g., MA, MS, MEng, MEd, MSW, MBA)  Occupational History   Not on file  Tobacco Use   Smoking status: Never   Smokeless tobacco: Never  Vaping Use   Vaping Use: Never used  Substance and Sexual Activity   Alcohol use: Not Currently   Drug use: No   Sexual activity: Not Currently    Partners: Male    Birth control/protection: Post-menopausal, Abstinence    Comment: 72 YEARS OLD, NO MORE THAN 5 PARTNERS ,des neg  Other Topics Concern   Not on file  Social History Narrative   Her daughter lives at home with her    Right handed   Caffeine: coffee, soda, (about 1 cup/day)   Social Determinants of Health   Financial Resource Strain: Not on file  Food Insecurity: Not on file  Transportation  Needs: Not on file  Physical Activity: Not on file  Stress: Not on file  Social Connections: Not on file     Family History: The patient's family history includes Arthritis in her brother, father, mother, and sister; Colon polyps in her father; Glaucoma in her brother, father, and mother; Heart disease in her father and mother; Hypertension in her brother, father, and mother; Mitral valve prolapse in her brother and mother; Neuropathy in her mother; Pulmonary embolism in her father; Rheum arthritis in her father and mother; Stroke in her brother, father, and mother; Von Willebrand disease in her mother.  ROS:   Please see the history of present illness.      EKGs/Labs/Other Studies Reviewed:    The following studies were reviewed today: Cardiac Studies & Procedures       ECHOCARDIOGRAM  ECHOCARDIOGRAM COMPLETE 06/15/2021  Narrative ECHOCARDIOGRAM REPORT    Patient Name:   BABYGIRL TRAGER Date of Exam: 06/15/2021 Medical Rec #:  [id number]              Height:       63.0 in Accession #:    [id number]             Weight:       161.4 lb Date of Birth:  [birth year]               BSA:          1.765 m Patient Age:    70 years               BP:           140/80 mmHg Patient Gender: F                      HR:           60 bpm. Exam Location:  [address]  Procedure: 2D Echo  Indications:    Murmur  History:        Patient has prior history of Echocardiogram examinations, most recent 02/02/2013. Risk Factors:Dyslipidemia.  Sonographer:    Devonne Doughty Referring Phys: [id number] Britzy Graul E Brennley Curtice  IMPRESSIONS   1. Left ventricular ejection fraction, by estimation, is 60 to 65%. The left ventricle has normal function. The left ventricle has no regional wall motion abnormalities. Left ventricular diastolic parameters were normal. 2. Right ventricular systolic function is normal. The right ventricular size is normal. There is normal pulmonary artery systolic pressure. The estimated right ventricular systolic pressure is 22.9 mmHg. 3. The mitral valve is normal in structure. Trivial mitral valve regurgitation. No evidence of mitral stenosis. 4. The aortic valve is tricuspid. Aortic valve regurgitation is trivial. Aortic valve sclerosis is present, with no evidence of aortic valve stenosis. 5. The inferior vena cava is normal in size with greater than 50% respiratory variability, suggesting right atrial pressure of 3 mmHg.  FINDINGS Left Ventricle: Left ventricular ejection fraction, by estimation, is 60 to 65%. The left ventricle has normal function. The left ventricle has no regional wall motion abnormalities. The left ventricular internal cavity size was normal in size. There is no left ventricular hypertrophy. Left ventricular diastolic parameters were normal.  Right Ventricle: The right ventricular size is normal. No increase in right ventricular wall thickness. Right ventricular systolic function is normal. There is normal pulmonary artery systolic pressure. The tricuspid regurgitant velocity is 2.23 m/s, and with an assumed right atrial pressure of 3 mmHg,  the estimated right ventricular systolic pressure is  22.9 mmHg.  Left Atrium: Left atrial size was normal in size.  Right Atrium: Right atrial size was normal in size.  Pericardium: There is no evidence of pericardial effusion.  Mitral Valve: The mitral valve is normal in structure. Trivial mitral valve regurgitation. No evidence of mitral valve stenosis.  Tricuspid Valve: The tricuspid valve is normal in structure. Tricuspid valve regurgitation is trivial.  Aortic Valve: The aortic valve is tricuspid. Aortic valve regurgitation is trivial. Aortic valve sclerosis is present, with no evidence of aortic valve stenosis. Aortic valve mean gradient measures 7.0 mmHg. Aortic valve peak gradient measures 12.7 mmHg. Aortic valve area, by VTI measures 2.02 cm.  Pulmonic Valve: The pulmonic valve was not well visualized. Pulmonic valve regurgitation is not visualized.  Aorta: The aortic root and ascending aorta are structurally normal, with no evidence of dilitation.  Venous: The inferior vena cava is normal in size with greater than 50% respiratory variability, suggesting right atrial pressure of 3 mmHg.  IAS/Shunts: No atrial level shunt detected by color flow Doppler.   LEFT VENTRICLE PLAX 2D LVIDd:         3.60 cm   Diastology LVIDs:         2.50 cm   LV e' medial:    9.57 cm/s LV PW:         0.90 cm   LV E/e' medial:  9.5 LV IVS:        0.90 cm   LV e' lateral:   11.10 cm/s LVOT diam:     2.00 cm   LV E/e' lateral: 8.2 LV SV:         83 LV SV Index:   47 LVOT Area:     3.14 cm   RIGHT VENTRICLE             IVC RV Basal diam:  3.00 cm     IVC diam: 1.90 cm RV Mid diam:    3.00 cm RV S prime:     15.80 cm/s TAPSE (M-mode): 2.1 cm  LEFT ATRIUM             Index        RIGHT ATRIUM           Index LA diam:        3.00 cm 1.70 cm/m   RA Area:     14.60 cm LA Vol (A2C):   32.9 ml 18.64 ml/m  RA Volume:   31.60 ml  17.90 ml/m LA Vol (A4C):   38.3 ml 21.70 ml/m LA Biplane Vol:  37.1 ml 21.02 ml/m AORTIC VALVE AV Area (Vmax):    2.00 cm AV Area (Vmean):   1.96 cm AV Area (VTI):     2.02 cm AV Vmax:           178.00 cm/s AV Vmean:          118.000 cm/s AV VTI:            0.411 m AV Peak Grad:      12.7 mmHg AV Mean Grad:      7.0 mmHg LVOT Vmax:         113.50 cm/s LVOT Vmean:        73.650 cm/s LVOT VTI:          0.264 m LVOT/AV VTI ratio: 0.64  AORTA Ao Root diam: 2.90 cm Ao Asc diam:  3.30 cm  MITRAL VALVE  TRICUSPID VALVE MV Area (PHT): 3.27 cm    TR Peak grad:   19.9 mmHg MV Decel Time: 232 msec    TR Vmax:        223.00 cm/s MV E velocity: 90.70 cm/s MV A velocity: 71.80 cm/s  SHUNTS MV E/A ratio:  1.26        Systemic VTI:  0.26 m Systemic Diam: 2.00 cm  Epifanio Lesches MD Electronically signed by Epifanio Lesches MD Signature Date/Time: 06/15/2021/9:14:44 PM    Final    MONITORS  CARDIAC EVENT MONITOR 01/26/2018  Narrative  Normal sinus rhythm, sinus bradycardia and sinus tachycardia with average heart rate 73 bpm. Heart rate ranged from 42 to 185 bpm.  Occasional PVCs with PVC burden 1%.  Nonsustained atrial tachycardia up to 13 beats.   CT SCANS  CT CARDIAC SCORING (SELF PAY ONLY) 10/31/2020  Addendum 10/31/2020  9:27 AM ADDENDUM REPORT: 10/31/2020 09:25  CLINICAL DATA:  Cardiovascular Disease Risk stratification  EXAM: Coronary Calcium Score  TECHNIQUE: A gated, non-contrast computed tomography scan of the heart was performed using 3mm slice thickness. Axial images were analyzed on a dedicated workstation. Calcium scoring of the coronary arteries was performed using the Agatston method.  FINDINGS: Coronary Calcium Score:  Left main: 0  Left anterior descending artery: 0  Left circumflex artery: 0  Right coronary artery: 0  Total: 0  Percentile: 0  Pericardium: Normal.  Ascending Aorta: Normal caliber.  Mild aortic atherosclerosis.  Non-cardiac: See separate report from  [facility].  IMPRESSION: Coronary calcium score of 0. This was 0 percentile for age-, race-, and sex-matched controls.  Mild aortic atherosclerosis.  RECOMMENDATIONS: Coronary artery calcium (CAC) score is a strong predictor of incident coronary heart disease (CHD) and provides predictive information beyond traditional risk factors. CAC scoring is reasonable to use in the decision to withhold, postpone, or initiate statin therapy in intermediate-risk or selected borderline-risk asymptomatic adults (age 19-75 years and LDL-C >=70 to <190 mg/dL) who do not have diabetes or established atherosclerotic cardiovascular disease (ASCVD).* In intermediate-risk (10-year ASCVD risk >=7.5% to <20%) adults or selected borderline-risk (10-year ASCVD risk >=5% to <7.5%) adults in whom a CAC score is measured for the purpose of making a treatment decision the following recommendations have been made:  If CAC=0, it is reasonable to withhold statin therapy and reassess in 5 to 10 years, as long as higher risk conditions are absent (diabetes mellitus, family history of premature CHD in first degree relatives (males <55 years; females <65 years), cigarette smoking, or LDL >=190 mg/dL).  If CAC is 1 to 99, it is reasonable to initiate statin therapy for patients >=80 years of age.  If CAC is >=100 or >=75th percentile, it is reasonable to initiate statin therapy at any age.  Cardiology referral should be considered for patients with CAC scores >=400 or >=75th percentile.  *2018 AHA/ACC/AACVPR/AAPA/ABC/ACPM/ADA/AGS/APhA/ASPC/NLA/PCNA Guideline on the Management of Blood Cholesterol: A Report of the American College of Cardiology/American Heart Association Task Force on Clinical Practice Guidelines. J Am Coll Cardiol. 2019;73(24):3168-3209.  Olga Millers, MD   Electronically Signed By: Olga Millers M.D. On: 10/31/2020 09:25  Narrative EXAM: OVER-READ INTERPRETATION  CT  CHEST  The following report is an over-read performed by radiologist Dr. Trudie Reed of [facility] on 10/31/2020. This over-read does not include interpretation of cardiac or coronary anatomy or pathology. The coronary calcium score interpretation by the cardiologist is attached.  COMPARISON:  Chest CTA 01/17/2013.  FINDINGS: Atherosclerotic calcifications in the  thoracic aorta. A few scattered tiny 2-3 mm pulmonary nodules are noted in the lungs bilaterally, stable compared to prior study from 2014, considered definitively benign. Within the visualized portions of the thorax there are no other larger more suspicious appearing pulmonary nodules or masses, there is no acute consolidative airspace disease, no pleural effusions, no pneumothorax and no lymphadenopathy. Visualized portions of the upper abdomen are unremarkable. There are no aggressive appearing lytic or blastic lesions noted in the visualized portions of the skeleton.  IMPRESSION: 1.  Aortic Atherosclerosis (ICD10-I70.0).  Electronically Signed: By: Trudie Reed M.D. On: 10/31/2020 08:58            EKG: No new tracing today  Recent Labs: 03/28/2022: Magnesium 2.1 07/03/2022: BUN 20; Creatinine, Ser 0.89; Hemoglobin 14.1; Platelets 159; Potassium 3.6; Sodium 140; TSH 0.873  Recent Lipid Panel    Component Value Date/Time   CHOL 189 03/28/2022 1101   TRIG 88 03/28/2022 1101   HDL 68 03/28/2022 1101   CHOLHDL 2.8 03/28/2022 1101   CHOLHDL 3.4 12/27/2014 1055   VLDL 26 12/27/2014 1055   LDLCALC 105 (H) 03/28/2022 1101     Physical Exam:    VS:  BP 124/80   Pulse 85   Ht 5\' 3"  (1.6 m)   Wt 158 lb 9.6 oz (71.9 kg)   LMP 11/26/2000 Comment: not SA  SpO2 98%   BMI 28.09 kg/m     Wt Readings from Last 3 Encounters:  10/15/22 158 lb 9.6 oz (71.9 kg)  10/11/22 159 lb (72.1 kg)  09/11/22 166 lb (75.3 kg)    GEN:  Well nourished, well developed in no acute distress.  Comfortable HEENT: Normal NECK: No JVD; No carotid bruits CARDIAC: RRR, 1/6 systolic murmur RESPIRATORY:  Clear to auscultation without rales, wheezing or rhonchi  ABDOMEN: Soft, non-tender, non-distended MUSCULOSKELETAL:  No edema; No deformity  SKIN: Warm and dry NEUROLOGIC:  Alert and oriented x 3 PSYCHIATRIC:  Normal affect   ASSESSMENT:    1. SVT (supraventricular tachycardia)   2. Essential hypertension   3. Hyperlipidemia, unspecified hyperlipidemia type   4. Primary hypertension   5. Statin myopathy      PLAN:    In order of problems listed above:  #Paroxysmal SVT s/p Ablation: Has long history of SVT s/p ablation at [facility] in 2001. TTE in 2014 with normal LVEF, no significant valvular disease. Had several recurrences mainly after surgical procedures and administration of anesthesia. Had recurrence of SVT in 04/2020 and 06/2020. Possible plan for repeat ablation if symptoms continue to recur. -Follow-up with Dr. Elberta Fortis as scheduled -Continue metop 50mg  XL -Discussed importance of maintaining hydration and avoiding excessive caffeine  #HTN: Blood pressure well controlled on current regimen and at goal <130/90. -Continue metop 50mg  XL as above -Continue losartan 25mg  daily if SBP>140  #HLD: #Statin Intolerance: Unable to tolerate several statins due to myalgias and repatha stopped due to weight gain.  Now doing well on pravastatin 20mg  daily and zetia. Ca score 10/2020 0.  -Continue bempedoic acid-zetia 180-10mg  daily -Continue pravstatin 20mg  daily -Ca score 0 -Check lipids -Goal LDL<100   Medication Adjustments/Labs and Tests Ordered: Current medicines are reviewed at length with the patient today.  Concerns regarding medicines are outlined above.  No orders of the defined types were placed in this encounter.  No orders of the defined types were placed in this encounter.    Patient Instructions  Medication Instructions:   Your physician recommends that  you continue on your  current medications as directed. Please refer to the Current Medication list given to you today.  *If you need a refill on your cardiac medications before your next appointment, please call your pharmacy*     Follow-Up: At [facility], you and your health needs are our priority.  As part of our continuing mission to provide you with exceptional heart care, we have created designated Provider Care Teams.  These Care Teams include your primary Cardiologist (physician) and Advanced Practice Providers (APPs -  Physician Assistants and Nurse Practitioners) who all work together to provide you with the care you need, when you need it.  We recommend signing up for the patient portal called "MyChart".  Sign up information is provided on this After Visit Summary.  MyChart is used to connect with patients for Virtual Visits (Telemedicine).  Patients are able to view lab/test results, encounter notes, upcoming appointments, etc.  Non-urgent messages can be sent to your provider as well.   To learn more about what you can do with MyChart, go to [email or web address].    Your next appointment:   6 month(s)  Provider:   With whomever you research and decide to take over your cardiology care.     Signed, Meriam Sprague, MD  10/15/2022 1:32 PM    [facility]

## 2022-10-09 NOTE — Progress Notes (Signed)
  Electrophysiology Office Note:   Date:  10/11/2022  ID:  [name], DOB 12-18-1950, MRN [id number]  Primary Cardiologist: Armanda Magic, MD Electrophysiologist: Regan Lemming, MD      History of Present Illness:   [name] is a 72 y.o. female with h/o SVT, hyperlipidemia, hypertension.  She is post ablation at [facility] in 2001.  Seen today for routine electrophysiology followup.  Since last being seen in our clinic the patient reports doing well.  She has had minimal episodes of SVT over the last few years.  She continues to be quite active.  She usually has SVT episodes after getting anesthesia.  she denies chest pain, palpitations, dyspnea, PND, orthopnea, nausea, vomiting, dizziness, syncope, edema, weight gain, or early satiety.   Review of systems complete and found to be negative unless listed in HPI.   Studies Reviewed:    EKG is not ordered today. EKG from 07/03/22 reviewed which showed sinus rhythm    Risk Assessment/Calculations:      Physical Exam:   VS:  BP (!) 128/90   Pulse 60   Ht 5\' 3"  (1.6 m)   Wt 159 lb (72.1 kg)   LMP 11/26/2000 Comment: not SA  SpO2 98%   BMI 28.17 kg/m    Wt Readings from Last 3 Encounters:  10/11/22 159 lb (72.1 kg)  09/11/22 166 lb (75.3 kg)  09/09/22 165 lb 3.2 oz (74.9 kg)     GEN: Well nourished, well developed in no acute distress NECK: No JVD; No carotid bruits CARDIAC: Regular rate and rhythm, no murmurs, rubs, gallops RESPIRATORY:  Clear to auscultation without rales, wheezing or rhonchi  ABDOMEN: Soft, non-tender, non-distended EXTREMITIES:  No edema; No deformity   ASSESSMENT AND PLAN:    1.  SVT: Status post ablation at [facility] in 2001.  Currently on Toprol-XL.  She has had minimal episodes of SVT.  She did have 1 episode that she relates to dehydration.  Aside from that, no acute complaints.  2.  Hypertension: Currently well-controlled  3.  Hyperlipidemia: Continue plan per primary  cardiology  Follow up with EP APP in 12 months  Signed, Dola Lunsford Jorja Loa, MD

## 2022-10-11 ENCOUNTER — Ambulatory Visit: Payer: Medicare HMO | Attending: Cardiology | Admitting: Cardiology

## 2022-10-11 ENCOUNTER — Encounter: Payer: Self-pay | Admitting: Cardiology

## 2022-10-11 VITALS — BP 128/90 | HR 60 | Ht 63.0 in | Wt 159.0 lb

## 2022-10-11 DIAGNOSIS — I10 Essential (primary) hypertension: Secondary | ICD-10-CM | POA: Diagnosis not present

## 2022-10-11 DIAGNOSIS — I471 Supraventricular tachycardia, unspecified: Secondary | ICD-10-CM | POA: Diagnosis not present

## 2022-10-11 DIAGNOSIS — E785 Hyperlipidemia, unspecified: Secondary | ICD-10-CM

## 2022-10-11 NOTE — Patient Instructions (Signed)
Medication Instructions:  ?Your physician recommends that you continue on your current medications as directed. Please refer to the Current Medication list given to you today. ? ?*If you need a refill on your cardiac medications before your next appointment, please call your pharmacy* ? ? ?Lab Work: ?None ordered ? ? ?Testing/Procedures: ?None ordered ? ? ?Follow-Up: ?At [facility], you and your health needs are our priority.  As part of our continuing mission to provide you with exceptional heart care, we have created designated Provider Care Teams.  These Care Teams include your primary Cardiologist (physician) and Advanced Practice Providers (APPs -  Physician Assistants and Nurse Practitioners) who all work together to provide you with the care you need, when you need it. ? ?Your next appointment:   ?1 year(s) ? ?The format for your next appointment:   ?In Person ? ?Provider:   ?You will see one of the following Advanced Practice Providers on your designated Care Team:   ?Renee Ursuy, PA-C ?Michael "Andy" Tillery, PA-C ? ? ? ? ?Thank you for choosing [facility]!! ? ? ?Ciera Beckum, RN ?[phone] ? ?

## 2022-10-15 ENCOUNTER — Ambulatory Visit: Payer: Medicare HMO | Attending: Cardiology | Admitting: Cardiology

## 2022-10-15 ENCOUNTER — Encounter: Payer: Self-pay | Admitting: Cardiology

## 2022-10-15 VITALS — BP 124/80 | HR 85 | Ht 63.0 in | Wt 158.6 lb

## 2022-10-15 DIAGNOSIS — E785 Hyperlipidemia, unspecified: Secondary | ICD-10-CM | POA: Diagnosis not present

## 2022-10-15 DIAGNOSIS — I471 Supraventricular tachycardia, unspecified: Secondary | ICD-10-CM | POA: Diagnosis not present

## 2022-10-15 DIAGNOSIS — G720 Drug-induced myopathy: Secondary | ICD-10-CM | POA: Diagnosis not present

## 2022-10-15 DIAGNOSIS — I10 Essential (primary) hypertension: Secondary | ICD-10-CM

## 2022-10-15 DIAGNOSIS — T466X5D Adverse effect of antihyperlipidemic and antiarteriosclerotic drugs, subsequent encounter: Secondary | ICD-10-CM

## 2022-10-15 NOTE — Patient Instructions (Signed)
Medication Instructions:   Your physician recommends that you continue on your current medications as directed. Please refer to the Current Medication list given to you today.  *If you need a refill on your cardiac medications before your next appointment, please call your pharmacy*     Follow-Up: At [facility], you and your health needs are our priority.  As part of our continuing mission to provide you with exceptional heart care, we have created designated Provider Care Teams.  These Care Teams include your primary Cardiologist (physician) and Advanced Practice Providers (APPs -  Physician Assistants and Nurse Practitioners) who all work together to provide you with the care you need, when you need it.  We recommend signing up for the patient portal called "MyChart".  Sign up information is provided on this After Visit Summary.  MyChart is used to connect with patients for Virtual Visits (Telemedicine).  Patients are able to view lab/test results, encounter notes, upcoming appointments, etc.  Non-urgent messages can be sent to your provider as well.   To learn more about what you can do with MyChart, go to [email or web address].    Your next appointment:   6 month(s)  Provider:   With whomever you research and decide to take over your cardiology care.

## 2022-10-21 ENCOUNTER — Telehealth: Payer: Self-pay

## 2022-10-21 NOTE — Telephone Encounter (Signed)
The fax that is asking for a refill on the zilretta needs to be ordered and sent to [facility] through [facility] I cannot fax that form.

## 2022-10-21 NOTE — Telephone Encounter (Signed)
Refilled not necessary at this time

## 2022-11-01 ENCOUNTER — Other Ambulatory Visit: Payer: Self-pay

## 2022-11-01 DIAGNOSIS — R935 Abnormal findings on diagnostic imaging of other abdominal regions, including retroperitoneum: Secondary | ICD-10-CM

## 2022-11-01 DIAGNOSIS — D135 Benign neoplasm of extrahepatic bile ducts: Secondary | ICD-10-CM

## 2022-11-01 DIAGNOSIS — K769 Liver disease, unspecified: Secondary | ICD-10-CM

## 2022-11-01 DIAGNOSIS — R1011 Right upper quadrant pain: Secondary | ICD-10-CM

## 2022-11-05 ENCOUNTER — Encounter: Payer: Self-pay | Admitting: Cardiology

## 2022-11-05 DIAGNOSIS — E785 Hyperlipidemia, unspecified: Secondary | ICD-10-CM

## 2022-11-05 DIAGNOSIS — Z79899 Other long term (current) drug therapy: Secondary | ICD-10-CM

## 2022-11-05 MED ORDER — PRAVASTATIN SODIUM 40 MG PO TABS
40.0000 mg | ORAL_TABLET | Freq: Every day | ORAL | 3 refills | Status: AC
Start: 2022-11-05 — End: ?

## 2022-11-05 NOTE — Telephone Encounter (Signed)
Pt is requesting a refill on pravastatin. Pt has pravastatin 40 mg tablets on her medication list, but at LOV, Dr. Shari Prows stated for pt to continue Pravastatin 20 mg. Please clarify pt's dosage.

## 2022-11-05 NOTE — Telephone Encounter (Signed)
OK to refill pravastatin 40 mg po daily.  This was increased at last lipid check (result) on 09/20/22) by Dr. Shari Prows.   Thanks!

## 2022-11-06 ENCOUNTER — Encounter: Payer: Self-pay | Admitting: Internal Medicine

## 2022-11-12 ENCOUNTER — Encounter: Payer: Self-pay | Admitting: Sports Medicine

## 2022-11-21 ENCOUNTER — Other Ambulatory Visit: Payer: Self-pay

## 2022-11-21 ENCOUNTER — Ambulatory Visit: Payer: Medicare HMO | Admitting: Family Medicine

## 2022-11-21 ENCOUNTER — Encounter: Payer: Self-pay | Admitting: Family Medicine

## 2022-11-21 VITALS — BP 122/80 | HR 62 | Ht 63.0 in | Wt 153.7 lb

## 2022-11-21 DIAGNOSIS — M79651 Pain in right thigh: Secondary | ICD-10-CM | POA: Diagnosis not present

## 2022-11-21 NOTE — Progress Notes (Signed)
   I, Stevenson Clinch, CMA acting as a scribe for Clementeen Graham, MD.  [name] is a 72 y.o. female who presents to [facility] at [facility] today for R thigh pain. Pt was previously seen by Dr. Jean Rosenthal on 09/27/22 for R knee Zilretta injection.  Today, pt c/o experiencing a "cramping" pain in her R thigh on the posterior aspect of the thigh and then a bruise appearing over the area. Pt locates pain to anterior and posterior aspects of the thigh. Bruising present on anterior aspect of the thigh. Has been taking it easy over the past week, not even going for workouts. Does not recall any injury. Now having more pain in the knee. Maternal family hx of bleeding disorder, Von Willebrand disease; notes that her mother's symptoms started with random bruising.    Pertinent review of systems: No fevers or chills  Relevant historical information: Family history of von Willebrand's disease No personal history of heavy bleeding.  Exam:  BP 122/80   Pulse 62   Ht 5\' 3"  (1.6 m)   Wt 153 lb 11.2 oz (69.7 kg)   LMP 11/26/2000 Comment: not SA  SpO2 96%   BMI 27.23 kg/m  General: Well Developed, well nourished, and in no acute distress.   MSK: Right thigh bruising anterior thigh distal quad present otherwise normal. Nontender normal motion and strength.    Lab and Radiology Results  Diagnostic Limited MSK Ultrasound of: Right distal thigh Hypoechoic fluid tracking within the subcutaneous tissue superficial to distal quad muscle.  It measures about 1 x 2 cm or less.  Nontender. Impression: Hematoma/seroma right distal thigh      Assessment and Plan: 72 y.o. female with contusion or hematoma/seroma right distal thigh.  The underlying etiology is unclear.  She was not doing very much when she had her symptom exacerbation.  She was doing a quad stretch and had cramping sensation in the back of the thigh hamstring area.  Visibly she has a bruise and on ultrasound there  is a seroma or hematoma. Plan for bit of watchful waiting and compression.  Voltaren gel could be helpful as well.  Recheck if not improving.  Of note her PCP office yesterday did a bunch of lab work to assess for [employer] disease.  This was at [facility] so I do not have the results.  I advised her to send the results to me and Dr. Jean Rosenthal.   PDMP not reviewed this encounter. Orders Placed This Encounter  Procedures   [place] LIMITED JOINT SPACE STRUCTURES LOW RIGHT(NO LINKED CHARGES)    Order Specific Question:   Reason for Exam (SYMPTOM  OR DIAGNOSIS REQUIRED)    Answer:   right thigh pain    Order Specific Question:   Preferred imaging location?    Answer:   [facility]   No orders of the defined types were placed in this encounter.    Discussed warning signs or symptoms. Please see discharge instructions. Patient expresses understanding.   The above documentation has been reviewed and is accurate and complete Clementeen Graham, M.D.

## 2022-11-21 NOTE — Patient Instructions (Signed)
Thank you for coming in today.   Thigh compression will help.   Let Dr Jean Rosenthal and me know what your labs show at [facility].   I recommend you obtained a compression sleeve to help with your joint problems. There are many options on the market however I recommend obtaining a thigh Body Helix compression sleeve.  You can find information (including how to appropriate measure yourself for sizing) can be found at [email or web address] [email or web address].ch.  Many of these products are health savings account (HSA) eligible.   You can use the compression sleeve at any time throughout the day but is most important to use while being active as well as for 2 hours post-activity.   It is appropriate to ice following activity with the compression sleeve in place.   Please use Voltaren gel (Generic Diclofenac Gel) up to 4x daily for pain as needed.  This is available over-the-counter as both the name brand Voltaren gel and the generic diclofenac gel.   Seroma A seroma is a collection of fluid on the body that looks like swelling or a mass. Seromas form where tissue has been injured or cut. Seromas vary in size. Some are small and painless. Others may grow large and cause pain or discomfort. Many seromas go away on their own as the fluid is naturally absorbed by the body, and some seromas need to be drained. What are the causes? Seromas form because tissue has been damaged or removed. This tissue damage may happen during surgery or because of an injury or trauma. When tissue is damaged or removed, empty space is created. The body's defense system (immune system) causes fluid to enter the empty space and form a seroma. What are the signs or symptoms? Symptoms of this condition include: Swelling at the site of a surgical incision or an injury. Drainage of clear fluid at the surgery or injury site. Discomfort or pain. How is this diagnosed? This condition is diagnosed based on: Your symptoms. Your medical history. A physical exam.  During the exam, your health care provider will press on the seroma. You may also have tests, such as: Blood tests. An ultrasound, a CT scan, or other imaging tests. How is this treated? Some seromas go away on their own. Your health care provider may watch the seroma to make sure it does not cause any problems. Seromas that do not go away on their own may be treated. Treatment may include: Using a needle to drain the fluid from the seroma (needle aspiration). Putting in a small, thin tube (catheter) to drain the fluid. Putting on a bandage (dressing), such as an elastic bandage or binder. Taking antibiotics, if the seroma gets infected. In rare cases, surgery may be done to remove the seroma and repair the area. Follow these instructions at home:  If you were prescribed antibiotics, take them as told by your health care provider. Do not stop using the antibiotic even if you start to feel better. Take over-the-counter and prescription medicines only as told by your health care provider. Return to your normal activities as told by your health care provider. Ask your health care provider what activities are safe for you. Check your seroma every day for signs of infection. Check for: Redness or pain. More swelling. More fluid. Warmth. Pus or a bad smell. Keep all follow-up visits. Your health care provider needs to check that your seroma is healing. Contact a health care provider if: You have a fever. You have redness or pain at  the site of your seroma. Your seroma is more swollen or is getting bigger. You have more fluid coming from your seroma. Your seroma feels warm to the touch. You have pus or a bad smell coming from your seroma. Get help right away if: You have a fever along with severe pain, redness at the site, or chills. You feel confused or have trouble staying awake. You feel like your heart is beating very fast. You feel short of breath or are breathing fast. You have  cool, clammy, or sweaty skin. Summary Seromas can form because of injury or surgery on tissue. Seromas can cause swelling, drainage of clear fluid, and discomfort or pain at the surgery or injury site. Some seromas go away on their own. Other seromas may need to be drained. Check your seroma every day for signs of infection. Signs include redness, pain, more swelling, more fluid, warmth, pus, or a bad smell. This information is not intended to replace advice given to you by your health care provider. Make sure you discuss any questions you have with your health care provider. Document Revised: 06/25/2021 Document Reviewed: 06/25/2021 Elsevier Patient Education  2024 [employer].

## 2022-11-28 ENCOUNTER — Other Ambulatory Visit: Payer: Medicare HMO

## 2022-11-28 ENCOUNTER — Ambulatory Visit (HOSPITAL_BASED_OUTPATIENT_CLINIC_OR_DEPARTMENT_OTHER)
Admission: RE | Admit: 2022-11-28 | Discharge: 2022-11-28 | Disposition: A | Discharge status: Home / Self Care | Payer: Medicare HMO | Source: Ambulatory Visit | Attending: Internal Medicine | Admitting: Internal Medicine

## 2022-11-28 DIAGNOSIS — K769 Liver disease, unspecified: Secondary | ICD-10-CM | POA: Insufficient documentation

## 2022-11-28 DIAGNOSIS — K7689 Other specified diseases of liver: Secondary | ICD-10-CM | POA: Diagnosis not present

## 2022-11-28 DIAGNOSIS — R1011 Right upper quadrant pain: Secondary | ICD-10-CM | POA: Diagnosis present

## 2022-11-28 DIAGNOSIS — R935 Abnormal findings on diagnostic imaging of other abdominal regions, including retroperitoneum: Secondary | ICD-10-CM | POA: Insufficient documentation

## 2022-11-28 DIAGNOSIS — D135 Benign neoplasm of extrahepatic bile ducts: Secondary | ICD-10-CM | POA: Diagnosis present

## 2022-11-28 MED ORDER — GADOBUTROL 1 MMOL/ML IV SOLN
7.0000 mL | Freq: Once | INTRAVENOUS | Status: AC | PRN
  Administered 2022-11-28: 7 mL via INTRAVENOUS
  Filled 2022-11-28: qty 7.5

## 2022-11-29 ENCOUNTER — Encounter: Payer: Self-pay | Admitting: Internal Medicine

## 2022-12-02 ENCOUNTER — Ambulatory Visit: Payer: Medicare HMO

## 2022-12-05 ENCOUNTER — Other Ambulatory Visit: Payer: Medicare HMO

## 2022-12-06 NOTE — Progress Notes (Unsigned)
[name] [address] [place] [address] Phone: [phone]   Assessment and Plan:     There are no diagnoses linked to this encounter.  ***   Pertinent previous records reviewed include ***   Follow Up: ***     Subjective:   I, [name], am serving as a Neurosurgeon for Doctor Richardean Sale  Chief Complaint: OMT follow up    HPI:  03/23/21 Patient with chronic back/neck pain hoping to avoid surgical intervention.   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 She feels like she is doing better. Meloxicam is helping    05/25/2021 Patient states that she's okay will hopefully be getting a CT scan soon , just here for some maintenance     06/25/2021 Patient states that she's feeling pretty good isn't in as much pain as she had been having still stiff at times needs her adjustment    07/23/2021 Patient states time for a tune up, neck and shoulder are tight giving her a little bit of a headache    08/13/2021 Patient states that she is doing the neck and shoulder are tight still getting a headache   5/16/023 Patient states she is doing pain is intermittent but over all better    10/02/2021 Patient states time for a tune up    11/01/2021 Patient states that she is a little tight this week    12/04/2021 Patient states the pain isnt as intense , has been in [place]    01/01/2022 Patient states ready for  tune and some    01/17/2022 Patient states the knee is great , hip is better , here for adjustment in the neck and shoulders    02/08/2022 Patient states she is in pain , neck shoulder and lower back    03/11/2022 Patient states that pain in her right knee is back    03/18/2022 Patient states right side pain    03/22/2022 Patient states that her back wont cooperate , when she is sitting or laying she will get pain from her knees up to buttock that feels like a burning , tylenol is not working , no MOI, she doesn't know if its coming from her knees  or from her lower back , she is able to do silver sneaker , but sitting and driving is painful    91/47/8295 Patient states she's doing had MRI    04/08/2022 Patient states will give her the phone number to [facility] , here for adjustment    05/10/2022 Patient states neck is acting up    05/21/2022 Patient states right sided body pain    06/07/2022 Patient states that right hip and sometimes knee, Wednesday was a bad day , better but still doesn't know what to do long term    06/27/2022 Patient states hip is better, but her calf is sore and sharp pain when she sits and lays on her side    08/30/2022 Patient states right knee is flared thinks its time for MRI   09/11/2022 Patient states wants to go over MRI results , she  has pain when she sleeps and pain with flexion    09/27/2022 Patient states   11/21/2022 [name] is a 72 y.o. female who presents to [facility] at [facility] today for R thigh pain. Pt was previously seen by Dr. Jean Rosenthal on 09/27/22 for R knee Zilretta injection.   Today, pt c/o experiencing a "cramping" pain in her R thigh on the posterior aspect of the thigh and then a bruise appearing over the area. Pt locates pain to anterior and posterior aspects of the thigh. Bruising present on anterior aspect of the thigh. Has been taking it easy over the past week, not even going for workouts. Does not recall any injury. Now having more pain in the knee. Maternal family hx of bleeding disorder, Von Willebrand disease; notes that her  mother's symptoms started with random bruising.      Pertinent review of systems: No fevers or chills   Relevant historical information: Family history of von Willebrand's disease No personal history of heavy bleeding.   Exam:  BP 122/80   Pulse 62   Ht 5\' 3"  (1.6 m)   Wt 153 lb 11.2 oz (69.7 kg)   LMP 11/26/2000 Comment: not SA  SpO2 96%   BMI 27.23 kg/m  General: Well Developed, well nourished, and in no acute distress.    MSK: Right thigh bruising anterior thigh distal quad present otherwise normal. Nontender normal motion and strength.       Lab and Radiology Results   Diagnostic Limited MSK Ultrasound of: Right distal thigh Hypoechoic fluid tracking within the subcutaneous tissue superficial to distal quad muscle.  It measures about 1 x 2 cm or less.  Nontender. Impression: Hematoma/seroma right distal thigh  12/10/2022 Patient states    Relevant Historical Information: Hashimoto's, DDD lumbar spine, history of SVT  Additional pertinent review of systems negative.   Current Outpatient Medications:    Bempedoic Acid-Ezetimibe (NEXLIZET) 180-10 MG TABS, Take 1 tablet by mouth daily., Disp: 30 tablet, Rfl: 11   Cholecalciferol (VITAMIN D-3) 125 MCG (5000 UT) TABS, Take 5,000 Units by mouth See admin instructions. Monday and Friday, Disp: 30 tablet, Rfl:    fluticasone (FLONASE) 50 MCG/ACT nasal spray, Place 2 sprays into both nostrils daily., Disp: 16 g, Rfl: 0   levothyroxine (SYNTHROID) 75 MCG tablet, Take 75 mcg by mouth daily before breakfast., Disp: , Rfl:    losartan (COZAAR) 25 MG tablet, Take 1 tablet (25 mg total) by mouth daily., Disp: 90 tablet, Rfl: 2   metoprolol succinate (TOPROL-XL) 50 MG 24 hr tablet, TAKE 1 TABLET BY MOUTH EVERY DAY WITH OR IMMEDIATELY FOLLOWING A MEAL, Disp: 90 tablet, Rfl: 3   Multiple Vitamin (MULTIVITAMIN WITH MINERALS) TABS tablet, Take 1 tablet by mouth daily., Disp: , Rfl:    omeprazole (PRILOSEC) 40 MG capsule, Take 1 capsule  (40 mg total) by mouth daily. (Patient taking differently: Take 40 mg by mouth every other day.), Disp: 30 capsule, Rfl: 11   polyethylene glycol (MIRALAX / GLYCOLAX) packet, Take 17 g by mouth daily as needed for mild constipation. , Disp: , Rfl:    Polyvinyl Alcohol-Povidone PF (REFRESH) 1.4-0.6 % SOLN, Apply 1 drop to eye 3 (three) times daily as needed (for dry eyes)., Disp: , Rfl:    pravastatin (PRAVACHOL) 40 MG tablet, Take 1 tablet (40 mg total) by mouth daily., Disp: 90 tablet, Rfl: 3   Probiotic Product (PROBIOTIC ADVANCED PO), Take 1 capsule by mouth daily. , Disp: , Rfl:  tretinoin (RETIN-A) 0.05 % cream, Apply topically at bedtime., Disp: , Rfl:    White Petrolatum-Mineral Oil (SYSTANE NIGHTTIME) OINT, SMARTSIG:1 Inch(es) In Eye(s) Every Night, Disp: , Rfl:    Objective:     There were no vitals filed for this visit.    There is no height or weight on file to calculate BMI.    Physical Exam:    ***   Electronically signed by:  [name] 7:17 AM 12/06/22

## 2022-12-10 ENCOUNTER — Ambulatory Visit: Payer: Medicare HMO | Admitting: Sports Medicine

## 2022-12-10 VITALS — BP 102/70 | HR 81 | Ht 63.0 in | Wt 150.0 lb

## 2022-12-10 DIAGNOSIS — M79651 Pain in right thigh: Secondary | ICD-10-CM

## 2022-12-10 DIAGNOSIS — G8929 Other chronic pain: Secondary | ICD-10-CM | POA: Diagnosis not present

## 2022-12-10 DIAGNOSIS — M25561 Pain in right knee: Secondary | ICD-10-CM

## 2022-12-10 DIAGNOSIS — M1711 Unilateral primary osteoarthritis, right knee: Secondary | ICD-10-CM | POA: Diagnosis not present

## 2022-12-10 MED ORDER — MELOXICAM 15 MG PO TABS: 15.0000 mg | ORAL_TABLET | Freq: Every day | ORAL | 0 refills | Status: AC

## 2022-12-10 NOTE — Patient Instructions (Addendum)
Good to see you   - Start meloxicam 15 mg daily x2 weeks.  If still having pain after 2 weeks, complete 3rd-week of meloxicam. May use remaining meloxicam as needed once daily for pain control.  Do not to use additional NSAIDs while taking meloxicam.  May use Tylenol [id number] mg 2 to 3 times a day for breakthrough pain.  Follow up in 3 weeks

## 2022-12-20 ENCOUNTER — Ambulatory Visit: Payer: Medicare HMO | Attending: Cardiology

## 2022-12-20 DIAGNOSIS — E785 Hyperlipidemia, unspecified: Secondary | ICD-10-CM

## 2022-12-20 DIAGNOSIS — Z79899 Other long term (current) drug therapy: Secondary | ICD-10-CM

## 2022-12-20 LAB — LIPID PANEL
Chol/HDL Ratio: 2.4 ratio (ref 0.0–4.4)
Cholesterol, Total: 114 mg/dL (ref 100–199)
HDL: 48 mg/dL (ref >39)
LDL Chol Calc (NIH): 48 mg/dL (ref 0–99)
Triglycerides: 93 mg/dL (ref 0–149)
VLDL Cholesterol Cal: 18 mg/dL (ref 5–40)

## 2022-12-28 ENCOUNTER — Emergency Department
Admission: EM | Admit: 2022-12-28 | Discharge: 2022-12-28 | Disposition: A | Discharge status: Home / Self Care | Payer: Medicare HMO | Attending: Emergency Medicine | Admitting: Emergency Medicine

## 2022-12-28 DIAGNOSIS — L509 Urticaria, unspecified: Secondary | ICD-10-CM

## 2022-12-28 DIAGNOSIS — R21 Rash and other nonspecific skin eruption: Secondary | ICD-10-CM | POA: Diagnosis present

## 2022-12-28 DIAGNOSIS — T7840XA Allergy, unspecified, initial encounter: Secondary | ICD-10-CM | POA: Insufficient documentation

## 2022-12-28 DIAGNOSIS — L500 Allergic urticaria: Secondary | ICD-10-CM | POA: Insufficient documentation

## 2022-12-28 DIAGNOSIS — T63441A Toxic effect of venom of bees, accidental (unintentional), initial encounter: Secondary | ICD-10-CM | POA: Diagnosis not present

## 2022-12-28 MED ORDER — HYDROXYZINE HCL 25 MG PO TABS
25.0000 mg | ORAL_TABLET | Freq: Once | ORAL | Status: AC
  Administered 2022-12-28: 25 mg via ORAL
  Filled 2022-12-28: qty 1

## 2022-12-28 MED ORDER — FAMOTIDINE IN NACL 20-0.9 MG/50ML-% IV SOLN
20.0000 mg | Freq: Once | INTRAVENOUS | Status: AC
  Administered 2022-12-28: 20 mg via INTRAVENOUS
  Filled 2022-12-28: qty 50

## 2022-12-28 MED ORDER — DILTIAZEM HCL 25 MG/5ML IV SOLN
INTRAVENOUS | Status: AC
  Filled 2022-12-28: qty 5

## 2022-12-28 MED ORDER — EPINEPHRINE 0.3 MG/0.3ML IJ SOAJ: 0.3000 mg | INTRAMUSCULAR | 0 refills | Status: AC | PRN

## 2022-12-28 MED ORDER — LORATADINE 10 MG PO TABS
10.0000 mg | ORAL_TABLET | Freq: Once | ORAL | Status: AC
  Administered 2022-12-28: 10 mg via ORAL
  Filled 2022-12-28: qty 1

## 2022-12-28 MED ORDER — PREDNISONE 20 MG PO TABS: 40.0000 mg | ORAL_TABLET | Freq: Every day | ORAL | 0 refills | Status: AC

## 2022-12-28 MED ORDER — LORATADINE 10 MG PO TABS: 10.0000 mg | ORAL_TABLET | Freq: Every day | ORAL | 0 refills | Status: AC

## 2022-12-28 MED ORDER — DILTIAZEM HCL 25 MG/5ML IV SOLN
10.0000 mg | Freq: Once | INTRAVENOUS | Status: AC
  Administered 2022-12-28: 10 mg via INTRAVENOUS
  Filled 2022-12-28 (×2): qty 5

## 2022-12-28 MED ORDER — CAMPHOR-MENTHOL 0.5-0.5 % EX LOTN
TOPICAL_LOTION | CUTANEOUS | Status: DC | PRN
  Filled 2022-12-28: qty 222

## 2022-12-28 MED ORDER — FAMOTIDINE 20 MG PO TABS: 20.0000 mg | ORAL_TABLET | Freq: Two times a day (BID) | ORAL | 0 refills | Status: AC

## 2022-12-28 MED ORDER — METHYLPREDNISOLONE SODIUM SUCC 125 MG IJ SOLR
125.0000 mg | Freq: Once | INTRAMUSCULAR | Status: AC
  Administered 2022-12-28: 125 mg via INTRAVENOUS
  Filled 2022-12-28: qty 2

## 2022-12-28 MED ORDER — HYDROXYZINE HCL 25 MG PO TABS: ORAL_TABLET | ORAL | 0 refills | Status: AC

## 2022-12-28 NOTE — ED Notes (Signed)
Went over d/c paperwork at this time with patient. Pt had no questions, comments or concerns after review and verbally understood them.

## 2022-12-28 NOTE — Discharge Instructions (Addendum)
You have been seen in the Emergency Department (ED) today for an allergic reaction.  You have been stable throughout your stay in the Emergency Department.  Please take your medications as prescribed and follow up with your doctor as indicated.    Please keep your Epi-Pen with you at all times and use it if experience shortness of breath or difficulty breathing or if you believe you are having a severe allergic reaction.  If you use the Epi-Pen, though, please call 911 afterwards or go immediately to your nearest Emergency Department.  Return to the Emergency Department (ED) if you experience any worsening or new symptoms that concern you.

## 2022-12-28 NOTE — ED Provider Notes (Signed)
[facility] Provider Note    Event Date/Time   First MD Initiated Contact with Patient 12/28/22 1723     (approximate)   History   Urticaria (Patient was sweeping her porch and was stung by a bee or wasp - presents with generalized urticaria, no airway compromise; Per patient cannot take Benadryl because she has a history of SVT)   HPI  [name] is a 72 y.o. female here with diffuse rash and itching.  The patient was outside, cleaning, when she was stung by several bees.  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  She states that she immediately broke out in urticaria of her legs where she was stung and then this urticaria spread throughout her body.  She has had diffuse, severe itching since then.  She has had no shortness of breath.  No tongue swelling.  Of note, she has a history of SVT and has reacted with SVT to Benadryl in the past.  She is also reacted poorly to epinephrine.     Physical Exam   Triage Vital Signs: ED Triage Vitals  Encounter Vitals Group     BP      Systolic BP Percentile      Diastolic BP Percentile      Pulse      Resp      Temp      Temp src      SpO2      Weight      Height      Head Circumference      Peak Flow      Pain Score      Pain Loc      Pain Education      Exclude from Growth Chart     Most recent vital signs: Vitals:   12/28/22 2000 12/28/22 2030  BP: 118/71 117/80  Pulse: 70 65  Resp: 13 16  Temp:  97.6 F (36.4 C)  SpO2: 98% 97%     General: Awake, no distress.  CV:  Good peripheral perfusion.  Slight tachycardia. Resp:  Normal work of breathing.  Lungs clear to auscultation bilaterally.  No wheezes. Abd:  No distention.  No tenderness. Other:  Diffuse, severe urticaria coalescing throughout her trunk, neck, and bilateral extremities.  No lip or tongue swelling.  Oropharynx is widely patent without edema.  Phonation is normal.   ED Results / Procedures / Treatments    Labs (all labs ordered are listed, but only abnormal results are displayed) Labs Reviewed - No data to display   EKG    RADIOLOGY    I also independently reviewed and agree with radiologist interpretations.   PROCEDURES:  Critical Care performed: No  .1-3 Lead EKG Interpretation  Performed by: Shaune Pollack, MD Authorized by: Shaune Pollack, MD     Interpretation: non-specific     ECG rate:  90-110   ECG rate assessment: tachycardic     Rhythm: sinus tachycardia     Ectopy: none     Conduction: normal   Comments:     Indication: allergic reaction      MEDICATIONS ORDERED IN ED: Medications  camphor-menthol (SARNA) lotion (has no administration in time range)  methylPREDNISolone sodium succinate (SOLU-MEDROL) 125 mg/2 mL injection 125 mg (125 mg Intravenous Given 12/28/22 1734)  famotidine (PEPCID) IVPB 20 mg premix (0 mg Intravenous Stopped 12/28/22 1921)  loratadine (CLARITIN) tablet 10 mg (10 mg Oral Given 12/28/22 1734)  hydrOXYzine (ATARAX) tablet  25 mg (25 mg Oral Given 12/28/22 1734)  diltiazem (CARDIZEM) injection 10 mg ( Intravenous Canceled Entry 12/28/22 1921)  hydrOXYzine (ATARAX) tablet 25 mg (25 mg Oral Given 12/28/22 1752)  famotidine (PEPCID) IVPB 20 mg premix (0 mg Intravenous Stopped 12/28/22 2117)     IMPRESSION / MDM / ASSESSMENT AND PLAN / ED COURSE  I reviewed the triage vital signs and the nursing notes.                              Differential diagnosis includes, but is not limited to, allergic reaction/urticaria, anaphylaxis, viral urticaria  Patient's presentation is most consistent with acute presentation with potential threat to life or bodily function.  The patient is on the cardiac monitor to evaluate for evidence of arrhythmia and/or significant heart rate changes  72 year old female here with diffuse body urticaria and itching in the setting of bee sting.  Patient unfortunately unable to take Benadryl or EpiPen due to history of  arrhythmias.  Patient was given Atarax, steroids, Pepcid, and Zyrtec.  Patient was also given a dose of diltiazem as she has a history of recurrent SVT and tachycardia and had a heart rate in the 110s, though I suspect this was more so due to irritation with the itching.  She does not appear to be in shock.  She has no second system involvement or signs of anaphylaxis.  This was also given in the event that patient would need epinephrine to combat any arrhythmias.  Patient monitored for several hours with resolution of her urticaria and she feels markedly improved.  Will discharge home with antihistamines and good return precautions.    FINAL CLINICAL IMPRESSION(S) / ED DIAGNOSES   Final diagnoses:  Allergic reaction, initial encounter  Urticaria     Rx / DC Orders   ED Discharge Orders          Ordered    famotidine (PEPCID) 20 MG tablet  2 times daily        12/28/22 2124    hydrOXYzine (ATARAX) 25 MG tablet        12/28/22 2124    loratadine (CLARITIN) 10 MG tablet  Daily        12/28/22 2124    predniSONE (DELTASONE) 20 MG tablet  Daily        12/28/22 2124    EPINEPHrine 0.3 mg/0.3 mL IJ SOAJ injection  As needed        12/28/22 2124             Note:  This document was prepared using Dragon voice recognition software and may include unintentional dictation errors.   Shaune Pollack, MD 12/28/22 2125

## 2022-12-30 NOTE — Group Note (Deleted)

## 2022-12-31 ENCOUNTER — Ambulatory Visit: Payer: Medicare HMO | Admitting: Sports Medicine

## 2023-01-06 ENCOUNTER — Telehealth: Payer: Self-pay | Admitting: Cardiology

## 2023-01-06 NOTE — Telephone Encounter (Signed)
-----   Message from Nurse Corky Crafts sent at 12/24/2022  7:34 AM EDT ----- Regarding: needs to establish with new gen cards This pt needs to get a call back to establish with a new [facility] Cards in place of Dr. Shari Prows.  She has some results that need to be reviewed by whomever you decide for her to go with as her new Cardiologist.  Can you please call her back and schedule her with a new [facility] and let myself or triage know who you go with so that we can send her results to that Provider and covering to advise on?  Thanks, [employer]

## 2023-01-06 NOTE — Telephone Encounter (Signed)
LVM 3x to schedule pt with a new cardiologist. No call back to schedule. FYI - will send letter to pt.

## 2023-01-22 ENCOUNTER — Telehealth: Payer: Self-pay | Admitting: Cardiology

## 2023-01-22 DIAGNOSIS — Z79899 Other long term (current) drug therapy: Secondary | ICD-10-CM

## 2023-01-22 DIAGNOSIS — E785 Hyperlipidemia, unspecified: Secondary | ICD-10-CM

## 2023-01-22 MED ORDER — PRAVASTATIN SODIUM 40 MG PO TABS: 40.0000 mg | ORAL_TABLET | Freq: Every day | ORAL | 2 refills | Status: DC

## 2023-01-22 NOTE — Telephone Encounter (Signed)
Pt's medication was sent to pt's pharmacy as requested. Confirmation received.  °

## 2023-01-22 NOTE — Telephone Encounter (Signed)
*  STAT* If patient is at the pharmacy, call can be transferred to refill team.   1. Which medications need to be refilled? (please list name of each medication and dose if known) pravastatin (PRAVACHOL) 40 MG tablet    4. Which pharmacy/location (including street and city if local pharmacy) is medication to be sent to?   [id number] - [place], [place] - [address] Phone: [phone]  Fax: [phone]     5. Do they need a 30 day or 90 day supply? 90     Pt states she is completely out

## 2023-02-07 ENCOUNTER — Telehealth: Payer: Self-pay | Admitting: Cardiology

## 2023-02-07 MED ORDER — NEXLIZET 180-10 MG PO TABS: 1.0000 | ORAL_TABLET | Freq: Every day | ORAL | 2 refills | Status: AC

## 2023-02-07 NOTE — Telephone Encounter (Signed)
Pt's medication was sent to pt's pharmacy as requested. Confirmation received.  °

## 2023-02-07 NOTE — Telephone Encounter (Signed)
*  STAT* If patient is at the pharmacy, call can be transferred to refill team.   1. Which medications need to be refilled? (please list name of each medication and dose if known)   Bempedoic Acid-Ezetimibe (NEXLIZET) 180-10 MG TABS   2. Would you like to learn more about the convenience, safety, & potential cost savings by using the [facility]?   3. Are you open to using the [facility] (Type [facility]. ).  4. Which pharmacy/location (including street and city if local pharmacy) is medication to be sent to?  [id number] - [place], [place] - [address]   5. Do they need a 30 day or 90 day supply?   90 day  Patient stated she is completely out of this medication.

## 2023-02-13 ENCOUNTER — Inpatient Hospital Stay: Admit: 2023-02-13 | Discharge: 2023-02-13 | Payer: PRIVATE HEALTH INSURANCE | Primary: Internal Medicine

## 2023-02-13 ENCOUNTER — Encounter: Admit: 2023-02-13 | Payer: PRIVATE HEALTH INSURANCE | Attending: Orthopedic Surgery | Primary: Family Medicine

## 2023-02-13 DIAGNOSIS — M25561 Pain in right knee: Principal | ICD-10-CM

## 2023-02-13 DIAGNOSIS — G8929 Other chronic pain: Secondary | ICD-10-CM

## 2023-02-13 DIAGNOSIS — M1711 Unilateral primary osteoarthritis, right knee: Secondary | ICD-10-CM

## 2023-02-13 MED ORDER — EPINEPHRINE 0.3 MG/0.3 ML INJECTION, AUTO-INJECTOR
0.3 | Freq: Once | INTRAMUSCULAR | Status: AC | PRN
Start: 2023-02-13 — End: ?

## 2023-02-13 NOTE — Patient Instructions
 ORTHOPAEDICS & REHABILITATIONLee Freddie Breech, M.D.Associate Professor of Orthopaedic SurgeryChief, Division of Adult ReconstructionHip & Knee Arthritis, Total Joint Replacement, & Revision Surgery Amy Haydon-Ryan, PA-CMeghan Apex, PA-C Donato Schultz, [email or web address]: [phone]  	Fax: [phone]	Dr. Renelda Loma Surgical Booking Assistant:  Dorene Grebe : [phone] (HIPS)(Please allow 3 business days to receive a call before calling the Surgical Scheduler)[facility] Diagnostic Radiology Scheduling:	 [phone]. Rubin's Clinical Office Locations:Tuesday AM: [employer], [address], [address] Floor, [place], [place] [address]NGEX[facility]WU AM & PM: [place], [place]: [address] [place], [place]  [address] Mailing Address: [address], [place], [place] [address]ZDG Patient - Focused Information about Orthopaedic Surgery Procedures:[email or web address]     PLEASE sign up for Epic MyChart if you have not already done so. This is a critical step that allows [place] to communicate with you and send electronic prescriptions. You will also be able to view notes, lab / test results, and send secure messages to your clinical care team. You can complete the sign-up process in 3 three easy steps:1) Visit the [facility] MyChart website at [email or web address] and click on the ?Sign Up Now? button. 2) At the next screen, select ?Request Access Code? and complete the enrollment screens.3) Complete the [employer] identify verification process.For COVID TESTING AND VACCINE INFORMATION:[phone])

## 2023-02-17 NOTE — Progress Notes
 Review of Systems Musculoskeletal:       New patient here for right knee pain. She reports that it started to be progressively worse in Jun e of this year when she received Zilretta injection to the right knee. She said it worked well, and she flexed her knee back that day and she reports she had extreme pain and developed a hematoma to her thigh. She reports that when the pain became worse r her. She reports that she had just received a gel injection on the 21st of this month from [facility], and she reports that she had a reaction to the injection, she reports that she had itching and burning throughout her body with lightheadedness. She takes meloxicam on a rare occasion and will utilize heat and ice.  All other systems reviewed and are negative.

## 2023-02-17 NOTE — Progress Notes
 ORTHOPAEDICS & REHABILITATION Division of Adult ReconstructionHip & Knee ArthritisTotal Joint Replacement & Revision [email or web address] Name:	Sara Teska SalazarMR Number:	[id number] Date of Birth:	18-Mar-1952Date of Visit:	02/13/2023  Chief Complaint:  Right Knee OAPatient's Primary Physician:Dr. Juline Patch for Evaluation WU:JWJX referral Patient Evaluated with:Louse Atadja MD PGY3HPI:Ms. Younger Sara Wilson is a 72 y.o. year old female who comes in today for evaluation of her right knee OA pain after initially being treated locally by Dr. Collene Schlichter at [facility]. She reports that the onset of her right knee pain was 3 years ago, but it started to become progressively worse in June of this year when she received a Zilretta injection in her right knee. She said it worked well, and she flexed her knee back that day and she reports she had extreme pain and developed a hematoma to her thigh. She reports that when the pain became worse  her. and she reports that she had a euflexxa injection in the right knee on 02/10/2023, and she had a reaction to and experienced itching and burning throughout her body with lightheadedness, dizziness, and nausea. She states that the Zilretta injections gave her the most relief.  She has had 2 of those injections.She takes Meloxicam for pain relief, uses heat and ice rarely.  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  She was living in [place] but now lives locally.The patient presents for initial evaluation and management in my office today.Review of Systems:The patient's intake sheet was reviewed at the time of the evaluation today.In addition, I have reviewed the ROS recorded by the medical assistant.  Risk Assessment:Ortho Surgery Risk  Current as of 3 hours ago    2 0 - 4 Points: Low Risk 5 - 100 Points: High Risk   Last Change:         Points Metrics 1 Age:  72   Patients older than or equal to 70 will receive 1 point.  Current as of 3 hours ago 1 Platelet:  149 x1000/?L   Patients with platelets less than 150 or greater than 450 receive 1 point.  Current as of 3 hours ago   Past Medical History:Past Medical History: Diagnosis Date  Disease of thyroid gland   Hashimoto's  Hashimoto's disease   Hyperlipidemia   Past Surgical History:Past Surgical History: Procedure Laterality Date  BREAST SURGERY    cysts  CARDIAC ELECTROPHYSIOLOGY MAPPING AND ABLATION    CARPAL TUNNEL RELEASE Right 09 2014  CESAREAN SECTION    Medications:Current Outpatient Medications Medication Sig  EPINEPHrine Inject 0.3 mg into the muscle once as needed.  levothyroxine Take 1 tablet (75 mcg total) by mouth daily.  losartan Take 1 tablet (25 mg total) by mouth.  meloxicam PLEASE SEE ATTACHED FOR DETAILED DIRECTIONS  metoprolol succinate XL Take 2 tablets (50 mg total) by mouth.  ezetimibe Take 1 tablet (10 mg total) by mouth at bedtime.  omega-3 acid ethyl esters Take 1 capsule (1 g total) by mouth daily.  omeprazole Take 1 capsule (40 mg total) by mouth every other day.  pravastatin 1 tablet No current facility-administered medications for this visit. Allergies:Allergies Allergen Reactions  Epinephrine Palpitations Social History:Social History Socioeconomic History  Marital status: Widowed Tobacco Use  Smoking status: Never Vaping Use  Vaping Use: Never used  Occupation: Occupational History  Not on file   Physical Exam:There were no vitals taken for this visit.There is no height or weight on file to calculate BMI. -  General: Alone for visit, Uses no assistive devices  -  Constitutional:  Well-developed, well-nourished, healthy-appearing, and in no acute distress.    - Neurologic: A&O x 3; NAD. Gait noted below.     - Psychologic: Demonstrates appropriate interactions, mood, and affect.    - Head: NCAT, symmetric face.    - Neck: Midline; AROM is full without pain.   - Skin: overlying the LE is without evidence of rash, erythema, or trophic changes.    - Lymph:  No generalized lower extremity lymphedema.   - CV:  lower extremities are warm and well perfused.  - Musculoskeletal / Extremity:Gait:  Minimal antalgic limp upon arising that improves with a few stepsSensation: Grossly normal to LT.Motor: 5/5 Strength in all groups tested.Vascular: 2+ pulses, no gross ulceration of skin visible.HIP EXAM RIGHT LEFT Incision(s)  Skin Not Inspected Skin Not Inspected Tenderness None None Flexion Contracture None None Flexion (Seated) 90-120 90-120 Abduction 60 60 Adduction Midline Midline External Rotation 80 80 Internal Rotation 30 30 FABER Negative Negative FADIR Negative Negative Straight Leg Raise Negative Negative KNEE EXAM RIGHT LEFT Incision   Skin Not Inspected Skin Not inspected Effusion None None Alignment Neutral Neutral Range of Motion (Degrees) 0-130 0-130 Gross AP Stability Normal Normal Varus / Valgus Stability at 0 Normal Normal Varus / Valgus Stability at 30 Normal Normal McMurray's Test Normal Normal Lachman's Test Normal Normal Flexion Pinch Negative Negative Joint Line Tenderness None None Patello-Femoral Crepitus Minimal Minimal Patello-Femoral Tendeness Minimal Minimal Patella Tracking Normal Normal The remainder of the lower extremity is otherwise unremarkable.Imaging:  XR Pelvis 02/13/2023:XR Bilateral Knees 02/13/2023:Formal Radiology Reading follows below:XR KNEE BILATERAL AP LATERAL AXIAL AND TUNNEL 02/13/2023: Indication: pain Prior: No previous studies are available for comparison Findings/impression:No acute fracture or dislocation.Mild to moderate degenerative changes involving bilateral knee joints, right more than left.Trace joint effusions. Reported and signed by: Antonieta Loveless, MD  [facility] XR PELVIS 1 OR 2 VIEWS 02/13/2023: Indication: pain Prior: No previous studies are available for comparison Findings:There is no acute fracture or dislocation.Degenerative changes involving the visualized lower lumbar spine and sacroiliac joints.Mild degenerative changes involving the hip joints.  Impression:No acute fracture or dislocation.Degenerative changes. Reported and signed by: Antonieta Loveless, MD  [facility] MRI Right Knee 09/08/2022:CLINICAL DATA:  Right knee pain for 6 months. No known injury or prior relevant surgery. EXAM: MRI OF THE RIGHT KNEE WITHOUT CONTRAST TECHNIQUE: Multiplanar, multisequence MR imaging of the knee was performed. No intravenous contrast was administered. COMPARISON:  Radiographs 01/01/2022. FINDINGS: MENISCI Medial meniscus: Appears mildly degenerated without evidence of tear or displaced meniscal fragment. The meniscal root is intact. Lateral meniscus: Diffusely degenerated. Oblique tear of the posterior horn extends to the inferior articular surface, best seen on the sagittal images. No displaced meniscal fragments are seen. LIGAMENTS Cruciates: The anterior and posterior cruciate ligaments are intact. Collaterals: The medial and lateral collateral ligament complexes are intact. CARTILAGE Patellofemoral: Mild patellofemoral degenerative changes with chondral thinning and surface irregularity. There is minimal subchondral cyst formation centrally in the trochlea. Medial: Mild chondral thinning and surface irregularity. No focal chondral defect. Lateral: Mild chondral thinning and surface irregularity. No focal chondral defect. MISCELLANEOUS Joint: No significant joint effusion. Popliteal Fossa: The popliteus muscle and tendon are intact. Small amount of fluid within the popliteus hiatus. Tiny Baker's cyst. Extensor Mechanism: The visualized quadriceps and patellar tendons are intact. Bones:  No acute or significant extra-articular osseous findings. Other: No other significant periarticular soft tissue findings. IMPRESSION: 1. Oblique tear of the posterior horn of the  lateral meniscus. The medial meniscus, cruciate and collateral ligaments are intact. 2. Mild tricompartmental degenerative chondrosis. No acute osseous findings. RECENT LAB TESTING:  Lab Results Component Value Date  WBC 5.1 11/28/2021  HGB 13.7 11/28/2021  HCT 37.50 11/28/2021  MCV 81.0 11/28/2021  PLT 149 (L) 11/28/2021 Lab Results Component Value Date  CREATININE 0.85 11/28/2021  BUN 12 11/28/2021  NA 141 11/28/2021  K 3.6 11/28/2021  CL 106 11/28/2021  CO2 23 11/28/2021 No results found for: INR, PROTIMECBC:Lab Results Component Value Date  WBC 5.1 11/28/2021  RBC 4.63 11/28/2021  HGB 13.7 11/28/2021  HCT 37.50 11/28/2021  MCV 81.0 11/28/2021  MCH 29.6 11/28/2021  MCHC 36.5 (H) 11/28/2021  PLT 149 (L) 11/28/2021  MPV 10.5 11/28/2021 CMP:Lab Results Component Value Date  GLU 95 11/28/2021  BUN 12 11/28/2021  CREATININE 0.85 11/28/2021  NA 141 11/28/2021  K 3.6 11/28/2021  CL 106 11/28/2021  CO2 23 11/28/2021  ALBUMIN 4.7 11/28/2021  PROT 7.2 11/28/2021  BILITOT 0.5 11/28/2021  ALKPHOS 91 11/28/2021  ALT 21 11/28/2021  GLOB 2.5 11/28/2021  CALCIUM 10.4 (H) 11/28/2021 ESR & CRP (Last 3 Values):  Component Value Date/Time  SEDRATE 1 05/28/2022 1115 HEMOGLOBIN A1C:No results found for: HGBA1CDiagnosis Coding:  ICD-10-CM  1. Chronic pain of right knee  M25.561 XR Knee Bilateral AP Lateral Axial and Tunnel ([facility])  G89.29 XR Pelvis 1 or 2 Views   Med Prior Auth For Ortho  2. Primary osteoarthritis of right knee  M17.11 Med Prior Auth For Ortho  Assessment / Narrative:The history, examination, and radiographic findings were reviewed in detail with the patient today. With respect to the left knee, there is mild to moderate arthritis noted.With respect to the right knee,there is mild to moderate arthritis noted, R>L.After discussion with the patient,in the short term, I recommend  that the patient  continue with periodic CSI in the right knee for pain relief every 6 months in [facility] with the PA staff.The patient can  keep walking, swimming and biking, and  can use topical creams, and occasional anti-inflammatories as needed for pain relief. She should utilize  fall prevention.There may be a role for a R TKA in the future,but there is no rush toward surgery today based on the patient's symptoms and imaging.Further Workup / Advanced Imaging Plan: None today. Treatment / Intervention Plan:None today.Follow-Up Plan:The patient will continue with Zilretta injections every 6 months with the PA staff and will return PRN for further evaluation of her right knee pain and surgical discussion if and when her condition worsens.Thank you for the kind courtesy of this patient's Dorris Fetch, M.D., Pola Corn, FAOABoard Certified Diplomate of the American Board of Orthopaedic SurgeryAssociate Professor of Orthopaedic Surgery, [employer] of Medicine	Chief, Tourist information centre manager Adult Reconstruction (Total Joint) Control and instrumentation engineer Surgeon, [facility]	Chief, [facility] Total Joint Replacement ProgramYale Orth[facility] and [facility]	[address]	[place]	[facility], [place]  [address] 	[phone] 		Main Office & Appointments	[phone]		Fax Line	[phone])	Assistant Direct LineBilling & Compliance: On the day of this patient's encounter, a total of 30 minutes was personally spent by me.  This time included preparation with record and imaging review, obtaining and reviewing patient history, performing a medically appropriate examination, counseling the patient, family, and/or caregiver, ordering medications, tests, or procedures, referring to and communicating with other healthcare professionals, and documenting all of the above clinical information into the medical record. This does not include any resident/fellow teaching time, or any time spent performing a procedural service.  Virtual Scribe Attestation(s):Scribed for Arlana Lindau, MD by Clemencia Course, medical scribe October 28, 2024The documentation recorded by the scribe accurately reflects the services I personally performed and the decisions made by me. I reviewed and confirmed all material entered and/or pre-charted by the scribe.cc:  Patient Care Team:McNeill, Toniann Fail, MD as PCP - General

## 2023-02-25 ENCOUNTER — Encounter: Admit: 2023-02-25 | Payer: PRIVATE HEALTH INSURANCE | Attending: Obstetrics and Gynecology | Primary: Family Medicine

## 2023-02-26 ENCOUNTER — Encounter: Admit: 2023-02-26 | Payer: PRIVATE HEALTH INSURANCE | Attending: Obstetrics and Gynecology | Primary: Family Medicine

## 2023-02-26 ENCOUNTER — Encounter: Admit: 2023-02-26 | Payer: PRIVATE HEALTH INSURANCE | Attending: Obstetrics and Gynecology | Primary: Internal Medicine

## 2023-02-26 VITALS — BP 147/86 | HR 74 | Temp 97.30000°F | Ht 62.0 in | Wt 157.0 lb

## 2023-02-26 DIAGNOSIS — E063 Autoimmune thyroiditis: Secondary | ICD-10-CM

## 2023-02-26 DIAGNOSIS — I471 SVT (supraventricular tachycardia) (HC Code): Secondary | ICD-10-CM

## 2023-02-26 DIAGNOSIS — H5789 Other specified disorders of eye and adnexa: Secondary | ICD-10-CM

## 2023-02-26 DIAGNOSIS — Z1231 Encounter for screening mammogram for malignant neoplasm of breast: Secondary | ICD-10-CM

## 2023-02-26 DIAGNOSIS — M1990 Unspecified osteoarthritis, unspecified site: Secondary | ICD-10-CM

## 2023-02-26 DIAGNOSIS — E785 Hyperlipidemia, unspecified: Secondary | ICD-10-CM

## 2023-02-26 DIAGNOSIS — E079 Disorder of thyroid, unspecified: Secondary | ICD-10-CM

## 2023-02-26 DIAGNOSIS — H269 Unspecified cataract: Secondary | ICD-10-CM

## 2023-02-26 DIAGNOSIS — Z01419 Encounter for gynecological examination (general) (routine) without abnormal findings: Secondary | ICD-10-CM

## 2023-02-26 MED ORDER — MELOXICAM 15 MG TABLET
15 | ORAL | Status: AC
Start: 2023-02-26 — End: ?

## 2023-02-26 MED ORDER — POLYETHYLENE GLYCOL 3350 17 GRAM ORAL POWDER PACKET
17 | Freq: Every day | ORAL | Status: AC
Start: 2023-02-26 — End: ?

## 2023-02-26 MED ORDER — DOCOSAHEXAENOIC ACID ORAL
Freq: Every day | ORAL | Status: AC
Start: 2023-02-26 — End: 2023-02-26

## 2023-02-26 MED ORDER — TRETINOIN 0.05 % TOPICAL CREAM
0.05 | Status: AC
Start: 2023-02-26 — End: ?

## 2023-02-26 MED ORDER — CARBOXYMETHYLCELLULOSE SODIUM 0.5 % EYE DROPS IN A DROPPERETTE
0.5 | OPHTHALMIC | Status: AC
Start: 2023-02-26 — End: ?

## 2023-02-26 MED ORDER — LACTOBACILLUS RHAMNOSUS GG 10 BILLION CELL CAPSULE
10 | Freq: Every day | ORAL | Status: AC
Start: 2023-02-26 — End: ?

## 2023-02-26 MED ORDER — CHOLECALCIFEROL (VITAMIN D3) 125 MCG (5,000 UNIT) TABLET
125 | Freq: Every day | ORAL | Status: AC
Start: 2023-02-26 — End: ?

## 2023-02-26 MED ORDER — IBUPROFEN 600 MG TABLET
600 | ORAL | Status: AC | PRN
Start: 2023-02-26 — End: ?

## 2023-02-26 MED ORDER — NEXLIZET 180 MG-10 MG TABLET
180-10 | Status: AC
Start: 2023-02-26 — End: ?

## 2023-02-26 MED ORDER — PEDIATRIC MULTIVITAMIN CHEWABLE TABLET
Freq: Every day | ORAL | Status: AC
Start: 2023-02-26 — End: 2023-02-26

## 2023-02-26 MED ORDER — PRAVASTATIN 40 MG TABLET
40 | Status: AC
Start: 2023-02-26 — End: ?

## 2023-02-26 MED ORDER — METOPROLOL SUCCINATE ER 50 MG TABLET,EXTENDED RELEASE 24 HR
50 | Freq: Every day | ORAL | Status: AC
Start: 2023-02-26 — End: ?

## 2023-03-03 ENCOUNTER — Encounter: Admit: 2023-03-03 | Payer: PRIVATE HEALTH INSURANCE | Attending: Orthopedic Surgery | Primary: Internal Medicine

## 2023-03-03 ENCOUNTER — Telehealth: Admit: 2023-03-03 | Payer: PRIVATE HEALTH INSURANCE | Attending: Orthopedic Surgery | Primary: Internal Medicine

## 2023-03-03 NOTE — Telephone Encounter
 [facility] MESSAGETime of call:   9:00 AMCaller:   Denijah Younger SalazarCaller's relationship to patient:  Horticulturist, commercial from [employer], hospital, agency, etc.):  n/a   Reason for call:   R knee swelling after going up the stairs on 03/01/23. Went to [facility]. No imaging done. Has appt on 03/07/23 for injections in her knee and wants to know what to do as far as that appointment goes. Please call patient. Thank you. Best telephone number for callback:   [phone] [facility] Scheduler

## 2023-03-04 NOTE — Telephone Encounter
 Relayed nsg message to patient.

## 2023-03-06 NOTE — Progress Notes
 [facility] OF MEDICINE						Department of Obstetrics, Gynecology, Reproductive MedicineCenter for [facility]  Moreen Younger Sara Wilson is a 72 y.o. G9P1001 female who presents for annual exam, repeat pap test and mammogram referral for next year. Pt has a history of abnormal pap and HR HPV. 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 She had a pap 07/30/22, in [place]. It was ASCUS, positive HR HPV. Pt was concerned about the HR HPV, wanted treatment. She had a planned colpo/LEEP 08/22/22, but LEEP was not done based on the appearance of her cervix during colpo. The patient has no complaints today. Past Medical History: Diagnosis Date  Cataracts, bilateral   Disease of thyroid gland   Hashimoto's  Hashimoto's disease   Hyperlipidemia   Osteoarthritis 2010  approximate date  SVT (supraventricular tachycardia) (HC Code)   Thyroid eye disease 2019 Past Surgical History: Procedure Laterality Date  BREAST SURGERY    cysts  CARDIAC ELECTROPHYSIOLOGY MAPPING AND ABLATION    CARPAL TUNNEL RELEASE Right 12/21/2012  CATARACT EXTRACTION W/  INTRAOCULAR LENS IMPLANT Bilateral 2022  CESAREAN SECTION    lower lid lift  2022 Hysterectomy: noOvaries intact: yesOB History   Gravida 1  Para 1  Term 1  Preterm    AB    Living 1   SAB    IAB    Ectopic    Molar    Multiple    Live Births      Menopause: yes 15 years agoHRT use: noSexually active: noPartner(s): widowedHistory of STIs: noLast pap: 07/30/2022, ASCUS, pos HR HPV, had colpo 5/2/2024History of abnormal Pap smear: yesLast mammogram: 8/2024Social History Socioeconomic History  Marital status: Widowed Tobacco Use  Smoking status: Never Vaping Use  Vaping Use: Never used Substance and Sexual Activity  Alcohol use: Never  Drug use: Never  Sexual activity: Not Currently Family History Problem Relation Age of Onset  Heart failure Mother   Stroke Mother 81  Hypertension Mother   Transient ischemic attack Father   Sarcoidosis Sister   Metabolic syndrome Brother   Asthma Maternal Grandfather  Patient Active Problem List Diagnosis  Acquired hypothyroidism  Age-related nuclear cataract of both eyes  Hashimoto's thyroiditis  History of paroxysmal supraventricular tachycardia  Hyperlipidemia  Laryngopharyngeal reflux  Osteopenia  Seasonal allergic rhinitis due to pollen  Thyroid eye disease  Statin myopathy Current Outpatient Medications Medication Sig Dispense Refill  carboxymethylcellulose (REFRESH PLUS) 0.5 % ophthalmic solution in dropperette Apply 1 drop to eye.    cholecalciferol, vitamin D3, 125 mcg (5,000 unit) tablet Take 1 tablet (5,000 Units total) by mouth daily.    EPINEPHrine (EPI-PEN) 0.3 mg/0.3 mL auto-injector Inject 0.3 mg into the muscle once as needed.    ibuprofen (ADVIL,MOTRIN) 600 mg tablet Take 1 tablet (600 mg total) by mouth q 6 min prn.    lactobacillus rhamnosus, GG, (CULTURELLE) 10 billion cell capsule Take 1 capsule by mouth daily.    levothyroxine (SYNTHROID, LEVOTHROID) 75 MCG tablet Take 1 tablet (75 mcg total) by mouth daily.    losartan (COZAAR) 25 mg tablet Take 1 tablet (25 mg total) by mouth.    meloxicam (MOBIC) 15 mg tablet Take 1 tablet (15 mg total) by mouth.    metoprolol succinate XL (TOPROL-XL) 50 mg 24 hr tablet Take 1 tablet (50 mg total) by mouth daily.    NEXLIZET 180-10 mg Tab     polyethylene glycol (MIRALAX) 17 gram packet Take 1 packet (17 g total)  by mouth daily.    pravastatin (PRAVACHOL) 40 mg tablet     tretinoin (RETIN-A) 0.05 % cream APPLY TO AFFECTED AREA EVERY DAY AT BEDTIME   No current facility-administered medications for this visit.  Allergies Allergen Reactions  Epinephrine Palpitations Review of SystemsGeneral: Denies unintentional weight loss or gain. HEENT: Denies changes in vision and hearing.Respiratory: Denies SOB and cough.?Cardio:  Denies palpitations and chest pain. ?GI: Denies abdominal pain, nausea, vomiting and diarrhea.?GU: Denies dysuria and urinary frequency.?Musculo: Endorses statin myalgia and arthritis.Skin: Denies rash and pruritus.Neuro: Denies headache and syncope.?Psych: Denies recent changes in mood. Denies anxiety and depression. Physical Exam:BP (!) 147/86  - Pulse 74  - Temp 97.3 ?F (36.3 ?C)  - Ht 5' 2 (1.575 m)  - Wt 71.2 kg  - SpO2 99%  - BMI 28.72 kg/m? Body mass index is 28.72 kg/m?Marland KitchenGeneral: calm, cooperative, in NADThyroid: no thyromegaly or nodules noted Heart: RRR without murmurLungs: CTABBreast: soft, normal appearance, symmetrical, non-tender, no masses, no skin changes and no lymphadenopathy bilaterally Abdomen: soft, non-tender, no masses Pelvic: normal female genitalia, no lesions or erythemaVagina: pink, rugae, no lesionsCervix: multiparous, pink, smooth, without lesions, negative CMT,  pap collected Uterus: firm, NT, normal size, shape and consistency Adnexa: firm, NT, no masses or enlargement noted Rectal: deferredThis exam was completed by [place] Muta [facility] student under my direct supervision.Assessment: 1. Well woman exam with routine gynecological examRoutine annual gyn exam performed with pap. Pap performed recent history of abnormal pap, positive HR HPV, colpo- Cytology gyn cases     [facility]); Future2. Screening mammogram, encounter forMammogram ordered, patient to schedule. - Mammography Screening Tomo Bilateral; Future Plan: Recommend annual mammogram. Recommend DEXA scan indicated age 76 or older based on risk profile. She was encouraged to discuss with her PCP. Recommend colonoscopy q 10 years starting at age 29 unless high risk. She was encouraged to discuss with her PCP. Blood pressure elevated in office, encouraged to follow up with PCP.Recommend routine follow-up with PCP Breast self awareness reviewedRecommend healthy diet and regular weight bearing exerciseReturn to care for annual in one year or as needed. Billie White WHNP student/Lyra Alaimo Silt, CNM

## 2023-03-07 ENCOUNTER — Encounter: Admit: 2023-03-07 | Payer: PRIVATE HEALTH INSURANCE | Attending: Orthopedic | Primary: Internal Medicine

## 2023-03-07 DIAGNOSIS — H269 Unspecified cataract: Secondary | ICD-10-CM

## 2023-03-07 DIAGNOSIS — M1711 Unilateral primary osteoarthritis, right knee: Secondary | ICD-10-CM

## 2023-03-07 DIAGNOSIS — M1712 Unilateral primary osteoarthritis, left knee: Secondary | ICD-10-CM

## 2023-03-07 DIAGNOSIS — E785 Hyperlipidemia, unspecified: Secondary | ICD-10-CM

## 2023-03-07 DIAGNOSIS — I471 SVT (supraventricular tachycardia) (HC Code): Secondary | ICD-10-CM

## 2023-03-07 DIAGNOSIS — H5789 Other specified disorders of eye and adnexa: Secondary | ICD-10-CM

## 2023-03-07 DIAGNOSIS — E063 Autoimmune thyroiditis: Secondary | ICD-10-CM

## 2023-03-07 DIAGNOSIS — M1990 Unspecified osteoarthritis, unspecified site: Secondary | ICD-10-CM

## 2023-03-07 DIAGNOSIS — E079 Disorder of thyroid, unspecified: Secondary | ICD-10-CM

## 2023-03-07 NOTE — Progress Notes
 Review of Systems Constitutional: Negative.  Musculoskeletal:       FU R kneeWalking up stairs Saturday evening and really sharp pain in kneeWoke up Sunday pain with swelling more pain and problems weight bearing. TO urgent care and they were concerned damage to need and recommended MRI. Pain goes up to 10/10. Rest helps with the pain.

## 2023-03-07 NOTE — Progress Notes
 [facility]TH[facility]ICS & [facility] Division of Adult ReconstructionHip & Knee ArthritisTotal Joint Replacement & Revision [email or web address] Name:	Sara Bendorf SalazarMR Number:	[id number] Date of Birth:	Feb 04, 1952Date of Visit:	03/07/2023  Sara Wilson is currently 72 y.o.She was last seen on 02/13/2023, Sara Wilson MDShe is status post:No Patient Care Coordination Note on file.   Review of Systems:I have reviewed the ROS, recorded by  the medical assistant at today's visit  Interval History:  72 year old female returns scheduled for right knee viscosupplementation.  Patient states she is unable to have viscosupplementation due to a previous adverse reaction. She has had excellent response with prior Zilretta injections, and would rather reschedule to receive that if approved.Of note: In the interval time patient did go to a walk-in clinic in [facility] for evaluation of increased right knee pain.  She was given a Medrol Dosepak (which she has decided not to take), Robaxin, and was told to rest, ice.  Additionally it was suggested that she may want to consider getting an MRI of the right knee to further evaluate possible meniscal tear.02/13/2023 new patient evaluation note reviewed.Patient Examined With:Unaccompanied Physical Exam: 72 year old female walking without ambulation assistive device.  Wide-based gait shifting weight right to left.  Independent sit/stand/transfer.  Sits comfortably with hips and knees flexed to 90?[name]  Patient's bilateral knees shows skin unremarkable, no palpable or visible effusion, no indication of infection/trauma.Patient has right knee with straight leg raise intact and overall seated range of motion is 0-100 degrees.  There is medial joint line tenderness/lateral joint line tenderness.  There is retropatellar crepitus.  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 Primary osteoarthritis of right knee  M17.11 Med Prior Auth For Ortho  2. Primary osteoarthritis of left knee  M17.12   Assessment: 72 year old female with known right greater than left knee tricompartmental osteoarthritis presented for scheduled injection, however has had previous adverse reaction to viscosupplementation and would rather avoid.  Alternatively, we will request authorization for right knee Zilretta injection she has been helpful in the past.We will hold off on MRI suggested by walk-in clinic as she clearly has right knee osteoarthritis as predominant issue.Further Workup / Advanced Imaging Plan: None at this time Treatment / Intervention Plan:General conservative/ nonoperative care for weight-bearing osteoarthritis includes:- Modify activities below threshold of discomfort & avoid particular triggers of pain when possible. -Choose non impact exercises like stationary bike, water exercising, and chair based routines.-During flare-ups utilize resting, elevation, cryotherapy/heating pad, and compression if helpful.-Utilize cane or walker to unload painful joints and for better stability/safety when needed.-Proper body weight, leg strength maintenance, and supportive shoes for standing/ambulating.-OTC meds like Tylenol for discomfort relief and NSAIDS for inflammation (if able).-Topical preparations such as Diclofenac gel and Lidocaine patches and creams as helpful. Authorization is requested for right knee Zilretta injection.  She is not interested in left knee injections at this timeFollow-Up Plan:3-4 weeks, [facility] office preferred for right knee Zilretta injection if authorized.Patient is advised to call the day before appointments to check on authorization status.Patient is advised to call with any questions/concernsAmy [facility] [id number] [place], [id number] 	[phone] 		Main Office & Appointments	[phone]		Fax Line	[phone])	Assistant Direct LineModal Dictationcc:  Patient Care Team:Aloi, Julien Nordmann, MD as PCP - General (Endocrinology, Diabetes & Metabolism)

## 2023-03-07 NOTE — Other
 Pt informed of pap results , ascus , pos hpv . Recommendation colposcopy , pt familiar with the procedure . Premed with motrin recommended .

## 2023-03-17 ENCOUNTER — Telehealth: Admit: 2023-03-17 | Payer: PRIVATE HEALTH INSURANCE | Attending: Orthopedic Surgery | Primary: Internal Medicine

## 2023-03-17 NOTE — Telephone Encounter
 Copied from CRM #[id number]. Topic: [facility] General CRM - General Inquiry>> Mar 17, 2023  4:42 PM Seniya Stoffers P wrote:Sara Wilson, ROMANOSKI called regarding patient called in wants injection prior to 12/18- Unsure if I can move sooner due to the authorization unsure of the dates that were provided   please advise

## 2023-03-18 ENCOUNTER — Encounter: Admit: 2023-03-18 | Payer: PRIVATE HEALTH INSURANCE | Attending: Orthopedic Surgery | Primary: Internal Medicine

## 2023-03-21 NOTE — Telephone Encounter
 Pt confirmed appt 12/18 and added to wait list

## 2023-03-24 ENCOUNTER — Encounter: Admit: 2023-03-24 | Payer: PRIVATE HEALTH INSURANCE | Attending: Rheumatology | Primary: Internal Medicine

## 2023-03-24 ENCOUNTER — Encounter: Admit: 2023-03-24 | Payer: PRIVATE HEALTH INSURANCE | Attending: Internal Medicine | Primary: Internal Medicine

## 2023-03-24 ENCOUNTER — Inpatient Hospital Stay: Admit: 2023-03-24 | Discharge: 2023-03-24 | Payer: PRIVATE HEALTH INSURANCE | Primary: Internal Medicine

## 2023-03-24 DIAGNOSIS — M2550 Pain in unspecified joint: Secondary | ICD-10-CM

## 2023-03-24 LAB — COMPREHENSIVE METABOLIC PANEL
BKR A/G RATIO: 2.1 (ref 1.0–2.2)
BKR ALANINE AMINOTRANSFERASE (ALT): 20 U/L (ref 10–35)
BKR ALBUMIN: 4.8 g/dL (ref 3.6–5.1)
BKR ALKALINE PHOSPHATASE: 85 U/L (ref 9–122)
BKR ANION GAP: 9 (ref 7–17)
BKR ASPARTATE AMINOTRANSFERASE (AST): 24 U/L (ref 10–35)
BKR AST/ALT RATIO: 1.2
BKR BILIRUBIN TOTAL: 0.4 mg/dL (ref <=1.2)
BKR BLOOD UREA NITROGEN: 21 mg/dL (ref 8–23)
BKR BUN / CREAT RATIO: 25 — ABNORMAL HIGH (ref 8.0–23.0)
BKR CALCIUM: 10.5 mg/dL — ABNORMAL HIGH (ref 8.8–10.2)
BKR CHLORIDE: 105 mmol/L (ref 98–107)
BKR CO2: 28 mmol/L (ref 20–30)
BKR CREATININE: 0.84 mg/dL (ref 0.40–1.30)
BKR EGFR, CREATININE (CKD-EPI 2021): >60 mL/min/{1.73_m2} (ref >=60)
BKR GLOBULIN: 2.3 g/dL (ref 2.0–3.9)
BKR GLUCOSE: 83 mg/dL (ref 70–100)
BKR POTASSIUM: 4.2 mmol/L (ref 3.3–5.3)
BKR PROTEIN TOTAL: 7.1 g/dL (ref 5.9–8.3)
BKR SODIUM: 142 mmol/L (ref 136–144)

## 2023-03-24 LAB — CBC WITH AUTO DIFFERENTIAL
BKR WAM ABSOLUTE IMMATURE GRANULOCYTES.: 0.02 x 1000/ÂµL (ref 0.00–0.30)
BKR WAM ABSOLUTE LYMPHOCYTE COUNT.: 2.35 x 1000/ÂµL (ref 0.60–3.70)
BKR WAM ABSOLUTE NRBC (2 DEC): 0 x 1000/ÂµL (ref 0.00–1.00)
BKR WAM ANC (ABSOLUTE NEUTROPHIL COUNT): 2.67 x 1000/ÂµL (ref 2.00–7.60)
BKR WAM BASOPHIL ABSOLUTE COUNT.: 0.01 x 1000/ÂµL (ref 0.00–1.00)
BKR WAM BASOPHILS: 0.2 % (ref 0.0–1.4)
BKR WAM EOSINOPHIL ABSOLUTE COUNT.: 0.05 x 1000/ÂµL (ref 0.00–1.00)
BKR WAM EOSINOPHILS: 0.9 % (ref 0.0–5.0)
BKR WAM HEMATOCRIT (2 DEC): 38.6 % (ref 35.00–45.00)
BKR WAM HEMOGLOBIN: 13.3 g/dL (ref 11.7–15.5)
BKR WAM IMMATURE GRANULOCYTES: 0.4 % (ref 0.0–1.0)
BKR WAM LYMPHOCYTES: 43.1 % (ref 17.0–50.0)
BKR WAM MCH (PG): 29.1 pg (ref 27.0–33.0)
BKR WAM MCHC: 34.5 g/dL (ref 31.0–36.0)
BKR WAM MCV: 84.5 fL (ref 80.0–100.0)
BKR WAM MONOCYTE ABSOLUTE COUNT.: 0.35 x 1000/ÂµL (ref 0.00–1.00)
BKR WAM MONOCYTES: 6.4 % (ref 4.0–12.0)
BKR WAM MPV: 11.2 fL (ref 8.0–12.0)
BKR WAM NEUTROPHILS: 49 % (ref 39.0–72.0)
BKR WAM NUCLEATED RED BLOOD CELLS: 0 % (ref 0.0–1.0)
BKR WAM PLATELETS: 188 x1000/ÂµL (ref 150–420)
BKR WAM RDW-CV: 14.2 % (ref 11.0–15.0)
BKR WAM RED BLOOD CELL COUNT.: 4.57 M/ÂµL (ref 4.00–6.00)
BKR WAM WHITE BLOOD CELL COUNT: 5.5 x1000/ÂµL (ref 4.0–11.0)

## 2023-03-24 LAB — C-REACTIVE PROTEIN     (CRP): BKR C-REACTIVE PROTEIN, HIGH SENSITIVITY: 0.2 mg/L

## 2023-03-24 LAB — PTH, INTACT WITHOUT CALCIUM: BKR PARATHYROID HORMONE INTACT: 72.6 pg/mL — ABNORMAL HIGH (ref 15.0–65.0)

## 2023-03-24 LAB — SEDIMENTATION RATE (ESR): BKR SEDIMENTATION RATE, ERYTHROCYTE: <1 mm/h (ref 0–20)

## 2023-03-24 NOTE — Progress Notes
 Spoke with pt over phone, she reports 2 weeks of increasing joint pain and back pain -- affecting neck, shoulders, lower back, hips mostly. Occasionally knees (known OA R>L) and hands -- sometimes hands feel swollen in the morning. No other joint swelling. Stiffness 30-60 min. Can lift arms above head in the morning. Occasional radiation of pain from low back to thighs when driving for a long time. No numbness or weakness. Symptoms are worse in the morning and in the end of the day, and occasional during the day. Tried tylenol 500 mg daily; and occasional meloxicam or ibuprofen although states she should not be on NSAIDs. No other symptoms, no weight loss, fever/chills, malaise, recent infections, recent exposure to sick people. A: degenerative disc disease and OA worsening, ddx with PMR, RA. No symptoms to suggest ANA related disease (prior titer was low 1:40 with neg ENA). Plan: Increase tylenol to 1000 mg three times a day, discussed not to take more than that. She will continue lidocaine patches to the back. Labs: CRP/ESR, CBC/CMP, RF, CCP repeat ANA at [facility] lab. 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 Overbook for Jan 7 at 12.30 with me. Cancel appt with Dr. Myer Peer in March.

## 2023-03-25 LAB — VITAMIN D, 25-HYDROXY: BKR VITAMIN D 25-HYDROXY TOTAL: 37 ng/mL

## 2023-03-25 LAB — RHEUMATOID FACTOR: BKR RHEUMATOID FACTOR: <10 [IU]/mL (ref <14.0)

## 2023-03-26 LAB — CYCLIC CITRUL PEPTIDE ANTIBODY, IGG: BKR CCP AB, IGG: 1.1 {ELISA'U} (ref <7.0)

## 2023-03-26 LAB — ANA IFA SCREEN W/REFL TO TITER AND PATTERN, IFA: BKR ANTI-NUCLEAR ANTIBODY (ANA): <1:80 {titer}

## 2023-03-27 LAB — VITAMIN D 1,25 (RENAL DISEASE OR HYPERCALCEMIA)(MINMETABLAB)(YH): VITAMIN D 1,25-DIHYDROXY: 139 pg/mL — ABNORMAL HIGH (ref 26–95)

## 2023-04-02 ENCOUNTER — Encounter: Admit: 2023-04-02 | Payer: PRIVATE HEALTH INSURANCE | Primary: Internal Medicine

## 2023-04-02 ENCOUNTER — Telehealth: Admit: 2023-04-02 | Payer: PRIVATE HEALTH INSURANCE | Attending: Orthopedic | Primary: Internal Medicine

## 2023-04-02 NOTE — Telephone Encounter
 Copied from CRM [id number]. Topic: General Message - [facility]>> Apr 02, 2023  4:59 PM Sara Wilson wrote:Patient called and was very upset and requested a call back from a nurse regarding her zilretta medication that was not approved by her insurance. Patient stated she reached out to her insurance company and was informed that a authorization was not sent in. Patient would like to know if there is another injection medication she can get that she is not allergic to. Patient states she can not get gel injection. Patient can be reached at [phone]. Thank you.

## 2023-04-03 ENCOUNTER — Encounter: Admit: 2023-04-03 | Payer: PRIVATE HEALTH INSURANCE | Attending: Orthopedic | Primary: Internal Medicine

## 2023-04-03 DIAGNOSIS — M1711 Unilateral primary osteoarthritis, right knee: Secondary | ICD-10-CM

## 2023-04-07 ENCOUNTER — Encounter: Admit: 2023-04-07 | Payer: PRIVATE HEALTH INSURANCE | Primary: Internal Medicine

## 2023-04-07 ENCOUNTER — Ambulatory Visit: Admit: 2023-04-07 | Payer: PRIVATE HEALTH INSURANCE | Primary: Internal Medicine

## 2023-04-08 NOTE — Telephone Encounter
 Copied from CRM [id number]. Topic: [facility] General CRM - General Inquiry>> Apr 08, 2023  9:46 AM Janine Limbo P wrote:YOUNGER Loletha Carrow called regarding called in states she has a injection scheduled for 12/18- and its not yet been approved. Patient states she has been dealing with this issue for months now and would like to speak to office management in regards to this issue .[name] I did provide patient the number to patient relations as well per patient request .. Please return call to patient in regards to injection and the nxt steps since  its not covered and its scheduled for tomorrow

## 2023-04-09 ENCOUNTER — Encounter: Admit: 2023-04-09 | Payer: PRIVATE HEALTH INSURANCE | Attending: Obstetrics and Gynecology | Primary: Internal Medicine

## 2023-04-09 ENCOUNTER — Other Ambulatory Visit: Payer: Self-pay | Admitting: Sports Medicine

## 2023-04-09 ENCOUNTER — Encounter: Admit: 2023-04-09 | Payer: PRIVATE HEALTH INSURANCE | Attending: Orthopedic | Primary: Internal Medicine

## 2023-04-09 VITALS — BP 112/77 | HR 68 | Temp 97.80000°F | Wt 160.0 lb

## 2023-04-09 DIAGNOSIS — R87610 Atypical squamous cells of undetermined significance on cytologic smear of cervix (ASC-US): Secondary | ICD-10-CM

## 2023-04-13 NOTE — Procedures
 Colposcopy Procedure NoteMaxine Wilson Sara Wilson is here for colposcopy.History: Past Medical History: Diagnosis Date  Cataracts, bilateral   Disease of thyroid gland   Hashimoto's  Hashimoto's disease   Hyperlipidemia   Osteoarthritis 2010  approximate date  SVT (supraventricular tachycardia) (HC Code)   Thyroid eye disease 2019 Past Surgical History: Procedure Laterality Date  BREAST SURGERY    cysts  CARDIAC ELECTROPHYSIOLOGY MAPPING AND ABLATION    CARPAL TUNNEL RELEASE Right 12/21/2012  CATARACT EXTRACTION W/  INTRAOCULAR LENS IMPLANT Bilateral 2022  CESAREAN SECTION    lower lid lift  2022 No LMP recorded. Patient is postmenopausal.OB History   Gravida 1  Para 1  Term 1  Preterm    AB    Living 1   SAB    IAB    Ectopic    Molar    Multiple    Live Births      BP 112/77  - Pulse 68  - Temp 97.8 ?F (36.6 ?C)  - Wt 72.6 kg  - SpO2 100%  - BMI 28.34 kg/m? No results found for: HIV1X2OBGyn ExamColposcopyPerformed by: Domenic Moras, MDAuthorized by: Domenic Moras, MD  Prior Cervical Cancer Screening Date of last cervical cancer screening:  11/6/2024Cytology results:  ASC-USHigh Risk HPV Results:  PositiveHPV Genotyping Results:  HPV16 positiveSexual History Sexually Active: sexually active  Birth Control Method:  No contraception at this timeHistory of STIs:  HPVHPV vaccine history:  Vaccine series incompleteLMP Reviewed: No  Pregnancy Test Performed:  Not indicatedProcedural risks discussed:  Bleeding, infection and painWritten consent obtained:  YesProcedure Time:  04/13/2023 10:36 AMTeam Members Present:  Misty Stanley VernerisTime Out initiated after patient positioned & prior to beginning of procedure: Yes  Name of Patient  & MRUN # or DOB stated and matches ID band or previously confirmed med record number: Yes  Proceduralist states or confirms procedure to be performed:: Yes  Procedural consent is used to verify procedure performed  & matches patient identifiers:: Yes  Procedure DetailsThe vulva and vagina were examined with grossly normal findings: external female genitalia normal  Bivalve speculum was placed in the vagina: yes  Applied to cervix: acetic acid  Magnification used:  7.5xLight used:  White light and Green filterCervix visualization:  Cervix fully visualizedSquamocolumnar junction:  Not fully visualized (comment)Acetowhite changes: No  Lesion 1 DetailsLesion 1 Visualization:  Fully visualized (right upper vaginal sidewall)Low Grade Features: erythematous patch at right upper vaginal side wall. Unable to visually determine atrophy vs other pathology.High Grade Features:  Thick/dense and Coarse punctuationImpression & FindingsImpression: low grade cervical dysplasia  Findings suspicious for cancer: None noted  Additional Procedure DetailsPhotographs Taken: Photos taken  Repeat pap smear done: No  Biopsy(s): yes  Type of Biopsy:  TargetedLocation:  Right upper vaginal side wallEndocervical Curettage:  YesSpecimens labeled and sent to pathology: yes  Hemostatis method:  PressurePatient tolerance of procedure:  Patient tolerated the procedure well with no immediate complicationsPost procedure complications:  NonePost-procedure sign out and specimen review completed: Yes  Post Procedure PlanPost-procedure plan:  Will base further treatment on pathology findings and Return to discuss pathology resultsResults/Follow up:  2 weeks  Chaperone present: chaperone presentEmployee chaperone name: Lonna Cobb, MD

## 2023-04-15 ENCOUNTER — Telehealth: Admit: 2023-04-15 | Payer: PRIVATE HEALTH INSURANCE | Attending: Orthopedic Surgery | Primary: Internal Medicine

## 2023-04-15 NOTE — Telephone Encounter
 NON-URGENTReconCaller: Tawny Kemerer: [phone] Paradise of: Dr. Nedra Hai RubinDOB: [birth year]I Need Tresa Endo From Dr. Gita Kudo Office To Call Me To Schedule My Injection Appt.CID: [id number]

## 2023-04-15 NOTE — Telephone Encounter
 NON-URGENT__Caller: Burman Freestone [facility]: [phone]: Sara Wilson of: She Saw A Pa , Saw Dr Donnie Coffin In Oct _DOB: 2052/05/27Please Call Me To Discuss This Patient. This Needs To Be Escalated. The Patient Has Left Messages That Have Not Been Answered.CID: [id number]

## 2023-04-15 NOTE — Telephone Encounter
 I left an VM for the patient to call back to schedule an injection with Dr. Donnie Coffin. The patient is allergic to an gel injection  and she will need to receive an Zilretta injection. Per conversation Collie Siad with [facility], the patient should scheduled with Dr. Donnie Coffin only for this injections. Also when we book this appointment we will also need to keep an eye out if the injection is available or not, due to previous experience of the injections not being available at the time of the appointment.

## 2023-04-17 NOTE — Telephone Encounter
 I contacted the patient, She is now scheduled for 05/08/2023. I have contacted management to see who to contact to make sure we receive the injection for this patient.

## 2023-04-18 ENCOUNTER — Telehealth: Payer: Self-pay

## 2023-04-18 NOTE — Telephone Encounter (Signed)
Received a call from [facility] in regard to patient and getting the Zilretta scheduled for delivery.   Called them back and have scheduled the delivery for 04/24/22 [facility]. This will need a signature once received. This is for medication Zilretta 32mg  patient will have a 6 dollar copay.   Once we receive medication can we get patient scheduled.

## 2023-04-25 ENCOUNTER — Encounter: Admit: 2023-04-25 | Payer: PRIVATE HEALTH INSURANCE | Attending: Orthopedic | Primary: Internal Medicine

## 2023-04-25 DIAGNOSIS — M1711 Unilateral primary osteoarthritis, right knee: Secondary | ICD-10-CM

## 2023-04-25 MED ORDER — ZILRETTA 32 MG INTRA-ARTICULAR SUSPENSION,EXTENDED RELEASE
32 | Freq: Once | INTRA_ARTICULAR | 1 refills | Status: AC
Start: 2023-04-25 — End: ?

## 2023-04-25 NOTE — Telephone Encounter (Signed)
 Zilretta received. Do benefits need to be re-run for the first of the year?

## 2023-04-28 NOTE — Telephone Encounter (Signed)
 noted

## 2023-04-28 NOTE — Telephone Encounter (Signed)
 Spoke to patient. She moved to [place]. She said that this was sent to [place] in error and should have gone to the ortho office she is seeing in [place].

## 2023-04-29 ENCOUNTER — Encounter: Admit: 2023-04-29 | Payer: PRIVATE HEALTH INSURANCE | Attending: Internal Medicine | Primary: Internal Medicine

## 2023-04-29 ENCOUNTER — Ambulatory Visit: Admit: 2023-04-29 | Payer: PRIVATE HEALTH INSURANCE | Attending: Internal Medicine | Primary: Internal Medicine

## 2023-04-29 VITALS — BP 110/77 | HR 76 | Temp 98.00000°F | Resp 18 | Wt 160.0 lb

## 2023-04-29 DIAGNOSIS — E063 Autoimmune thyroiditis: Secondary | ICD-10-CM

## 2023-04-29 DIAGNOSIS — E785 Hyperlipidemia, unspecified: Secondary | ICD-10-CM

## 2023-04-29 DIAGNOSIS — E079 Disorder of thyroid, unspecified: Secondary | ICD-10-CM

## 2023-04-29 DIAGNOSIS — M542 Cervicalgia: Secondary | ICD-10-CM

## 2023-04-29 DIAGNOSIS — H5789 Other specified disorders of eye and adnexa: Secondary | ICD-10-CM

## 2023-04-29 DIAGNOSIS — M2550 Pain in unspecified joint: Secondary | ICD-10-CM

## 2023-04-29 DIAGNOSIS — M1990 Unspecified osteoarthritis, unspecified site: Secondary | ICD-10-CM

## 2023-04-29 DIAGNOSIS — H269 Unspecified cataract: Secondary | ICD-10-CM

## 2023-04-29 DIAGNOSIS — I471 SVT (supraventricular tachycardia) (HC Code): Secondary | ICD-10-CM

## 2023-04-29 NOTE — Telephone Encounter (Signed)
 Alex from third party pharmacy called to arrange pickup/return of this med ( phone [phone]). Advised we do have the med but would need return label and packaging, he will check with pharmacy and call back.

## 2023-04-30 ENCOUNTER — Telehealth: Admit: 2023-04-30 | Payer: PRIVATE HEALTH INSURANCE | Attending: Orthopedic | Primary: Internal Medicine

## 2023-04-30 ENCOUNTER — Encounter: Admit: 2023-04-30 | Payer: PRIVATE HEALTH INSURANCE | Attending: Obstetrics and Gynecology | Primary: Internal Medicine

## 2023-04-30 VITALS — Ht 63.0 in | Wt 160.0 lb

## 2023-04-30 DIAGNOSIS — R87619 Unspecified abnormal cytological findings in specimens from cervix uteri: Secondary | ICD-10-CM

## 2023-04-30 DIAGNOSIS — I471 SVT (supraventricular tachycardia) (HC Code): Secondary | ICD-10-CM

## 2023-04-30 DIAGNOSIS — M1990 Unspecified osteoarthritis, unspecified site: Secondary | ICD-10-CM

## 2023-04-30 DIAGNOSIS — E063 Autoimmune thyroiditis: Secondary | ICD-10-CM

## 2023-04-30 DIAGNOSIS — E785 Hyperlipidemia, unspecified: Secondary | ICD-10-CM

## 2023-04-30 DIAGNOSIS — H269 Unspecified cataract: Secondary | ICD-10-CM

## 2023-04-30 DIAGNOSIS — E079 Disorder of thyroid, unspecified: Secondary | ICD-10-CM

## 2023-04-30 DIAGNOSIS — N890 Mild vaginal dysplasia: Secondary | ICD-10-CM

## 2023-04-30 DIAGNOSIS — H5789 Other specified disorders of eye and adnexa: Secondary | ICD-10-CM

## 2023-04-30 NOTE — Progress Notes
 VIDEO TELEHEALTH VISIT: This clinician is part of the telehealth program and is conducting this visit in a currently approved location. For this visit the clinician and patient were present via interactive audio & video telecommunications system that permits real-time communications, via the [employer].Patient's use of the telehealth platform followed consent and acknowledges agreement to permit telehealth for this visit. State patient is located in: [place] clinician is appropriately licensed in the above state to provide care for this visit. Other individuals present during the telehealth encounter and their role/relation: noneBecause this visit was completed over video, a hands-on physical exam was not performed. 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    ExamOBGyn ExamColposcopy Results	 1.  ENDOCERVIX, CURETTAGE:       - BENIGN SQUAMOUS EPITHELIUM      - NO ENDOCERVICAL TISSUE IS IDENTIFIED 2.  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

## 2023-04-30 NOTE — Telephone Encounter
-----   Message from Judith Blonder sent at 04/30/2023  1:52 PM EST -----Regarding: RE: ZilrettaHi Scottlyn Mchaney- The process would be the patient can self-pay for the injection. We contact the cost estimator line at [phone] for an estimate of cost. The patient then signs a wavier stating they are willing to pay the above listed cost. I don't find many insurances that approve Zilretta for more than one injection or even at all lately.----- Message -----From: Charlette Caffey, PASent: 04/30/2023  11:39 AM ESTTo: Sherrye Payor, RNSubject: RE: Arletta Bale                                 Thank you for the update.I requested the Zilretta 3 times. Still not clear if denial was reversed.She can not have Visco Gel injections due to previous reaction. There must be a process to have patient pay for the Zilretta out-of -pocket if she chooses. Have not gotten any clear direction on this process.AHR----- Message -----From: Sherrye Payor, RNSent: 04/30/2023  11:18 AM ESTTo: Charlette Caffey, PASubject: RE: Zilretta                                 The ortho department/patient relations has been speaking to the patient about options and the patient is scheduled to see Dr. Donnie Coffin.----- Message -----From: Charlette Caffey, PASent: 04/09/2023   3:59 PM ESTTo: Joni Fears, RN; Sherrye Payor, RN; #Subject: FW: Zilretta                                 Will forward to Melton Krebs to keep them in the loop too.No idea how this works.Lafonda Mosses- Does she make an appointment for injection?How does the office dispense/ bill the medication?I don't think we can send a med order to her personal pharmacy b/c it requires refrigeration.AHR----- Message -----From: Susann Givens: 04/09/2023   2:42 PM ESTTo: Charlette Caffey, PASubject: FW: Zilretta                                 Hi Anaeli Cornwall,If the patient wants to pay out of pocket for the injection according to pre-estimations it would be 25$ out of pocket----- Message -----From: Posey Rea LSent: 04/09/2023  12:49 PM ESTTo: Dorene Grebe DejesusSubject: FW: Zilretta                                 This is the response from Pre-Est team----- Message -----From: Kendal Hymen: 04/09/2023  12:31 PM ESTTo: Barkley Bruns DoucetteSubject: RE: Daylene Katayama afternoon,The cost would be $25.00----- Message -----From: Posey Rea LSent: 04/08/2023  11:56 AM ESTTo: Pola Corn; [facility] Preestimation .Subject: Zilretta                                     Good Morning, Can someone please provide a pre-estimated cost for CPT J3304 for Zilretta injection. Thank you,Stephanie

## 2023-04-30 NOTE — Progress Notes
 Sara Wilson Sara Wilson [id number]: [phone]: [phone] Dept Fax: [phone]    Sara Wilson is a 73 y.o. female who is presenting for a follow up of neck pain and right knee pain. Dx: Osteoarthritis, spine DJD. Previously positive ANA in low titer (negative in Dec 2024; neg ENA, RF, CCP). 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 Sx started about 5 years ago and mainly lower back, neck, shoulders, hips (R>L), previously knees (had 3 steroid injections to R knee over years, last injection about 6-8 weeks ago by Sports medicine). Injection helps for 6 weeks or so; pain is not severe and does not interrupt activities. Stiffness in the morning for about 1 hour, lessens with movements; comes back with prolonged immobility (recently R hip bothers more). Does stretching, aerobics, a little bit of weights at the gym -- 4-5/week. Lower back stiffness/pain has been more longstanding, probably 15 years. She wants to make sure she has no RA as her mother had it. She had low titer ANA 1:40 to 1:80, saw Rheumatologist last March who did additional tests which were negative (below) and reassured her.Initial evaluation 05/2022: widespread joint pain and back pain for many years, with prior imaging c/w degenerative joint disease in spine and mild OA in R knee. She had normal inflammatory markers previously and is able to maintain activity despite pain; in fact, she is able to exercise in the gym 4-5 times a week and feels that knee pains could be due to overuse injury. We do not have access to actual imaging (only reports) but appears that OA in the knee is mild. Despite some inflammatory features in her pain (eg, stiffness in the morning or after inactivity, only up to 30-60 min), there is low suspicion for spondyloarthritis. Weakly positive ANA is likely due to thyroid disease, and is overall common, up to 15-20% of population of this age; there is no signs of connective tissue disease. We repeated CRP/ESR which were normal and XR of SIJ showed mild  sclerosis of bilateral SI joints, nonspecific. Provided reassurance to the patient -- no suspicion for rheumatoid arthritis or lupus. Modify activity to avoid frequent squats, but encouraged to continue quadriceps strengthening exercises for OA. Tylenol for pain control. She knows to avoid NSAIDs. Follow up with her Sports medicine specialist in [place]. NOW, 04/29/23:Pt now presents due to increasing pain in neck, lower back, R shoulder, R knee (see telephone note 03/24/23) for about 2 months, mostly with activity but with some morning stiffness about 30 min, but no awakening from pain and no nocturnal pain. No sciatica, no bowel or bladder issues. We repeat labs in Dec 2024 -- normal ESR/CRP, negative ANA, RF, CCP. She increased tylenol to 1000 mg tid which helped a bit. Tried lidocaine patches without much effect. Has not done PT for many years.She exercises at the gym regularly ([employer]).  She already follows with Orthopedics (last seen 03/07/23) and is planned for R knee CSI with Dr. Donnie Coffin on 05/08/23. She had adverse reaction to Euflexxa injection described in Orthopedics note on 02/13/23. MRI of the R knee in May 2024 showed (report only) Oblique tear of the posterior horn of the lateral meniscus and mild tricompartmental dgenerative chondrosis. XR of the knees in Oct 2024 with mild to moderate OA R>L.Exam today without synovitis  in UE, mild swelling and tenderness over lateral lower aspect of the knee, mild tenderness over b/l trapezius and upper C spine (with full ROM), and mild R rotator cuff tendinopathy with positive Hawking test but full ROM. We referred patient for cervical spine and R shoulder X-rays, and physical therapy -- for OA/DJD. Review of Systems (negative except as noted; positive findings bold)Constitutional: weight changes, fatigue, malaise, fever, chills, sweats Skin: rashes, photosensitivity, hives, easy bruisability, alopecia, scalp tenderness, skin nodules or psoriasis Eyes:  pain, redness, itching, visual blurring, dryness, foreign body sensationENT: tinnitus, hearing loss, sinus congestion, loss of smell, dry nose, bloody nose, jaw claudication, oral ulcers, loss of taste, dry mouth, hoarsenessCardiac: chest pain, palpitations, irregular heartbeat, exertional dyspneaVascular: Raynaud?s, chilblains, frostbite, venous stasis, thrombosisPulmonary: shortness of breath, cough, wheeze, hemoptysis, chest wall pain, pleuritic chest pain, current tobacco use GI: difficulty swallowing, nausea, vomiting, GERD, ulcers, constipation, diarrhea, change in bowel habits, abdominal pain, liver diseaseGU: dysuria, blood in urine, nocturia, infections, kidney stonesEndocrine/Reproductive: cold intolerance, heat intolerance, miscarriageMusculoskeletal: morning stiffness, neck pain, back pain, joint pain (R shoulder on movements; R knee), joint swelling, muscle aching or tenderness, muscle weaknessNeurological: numbness, tingling, headaches, fainting, dizziness, imbalance, memory loss, seizure, strokeHeme/Lymph: swollen or tender glands, anemiaPsychiatric:  anxiety, irritability, depression, sleep disturbance    PMH: Hashimoto's thyroiditisThyroid eye disease (last 5-6 years)OASVT s/p ablationHLDHTN Medications:Current Outpatient Medications on File Prior to Visit Medication Sig Dispense Refill  carboxymethylcellulose (REFRESH PLUS) 0.5 % ophthalmic solution in dropperette Apply 1 drop to eye.    cholecalciferol, vitamin D3, 125 mcg (5,000 unit) tablet Take 1 tablet (5,000 Units total) by mouth daily. EPINEPHrine (EPI-PEN) 0.3 mg/0.3 mL auto-injector Inject 0.3 mg into the muscle once as needed.    ibuprofen (ADVIL,MOTRIN) 600 mg tablet Take 1 tablet (600 mg total) by mouth q 6 min prn.    lactobacillus rhamnosus, GG, (CULTURELLE) 10 billion cell capsule Take 1 capsule by mouth daily.    levothyroxine (SYNTHROID, LEVOTHROID) 75 MCG tablet Take 1 tablet (75 mcg total) by mouth daily.    losartan (COZAAR) 25 mg tablet Take 1 tablet (25 mg total) by mouth.    meloxicam (MOBIC) 15 mg tablet Take 1 tablet (15 mg total) by mouth.    methocarbamoL (ROBAXIN) 750 mg tablet Take 1 tablet (750 mg total) by mouth 3 (three) times daily as needed for muscle spasms. 30 tablet 0  methylPREDNISolone (MEDROL DOSEPACK) 4 mg tablet follow package directions (Patient not taking: Reported on 03/07/2023) 21 tablet 0  metoprolol succinate XL (TOPROL-XL) 50 mg 24 hr tablet Take 1 tablet (50 mg total) by mouth daily.    NEXLIZET 180-10 mg Tab     polyethylene glycol (MIRALAX) 17 gram packet Take 1 packet (17 g total) by mouth daily.    pravastatin (PRAVACHOL) 40 mg tablet     tretinoin (RETIN-A) 0.05 % cream APPLY TO AFFECTED AREA EVERY DAY AT BEDTIME   No current facility-administered medications on file prior to visit.  Allergies: Epinephrine  Family History:  Mother had RA and sarcoidosis. Brother has metabolic disorder without clear nameSister has thyroid disease, unclear what type2 sisters with psoriasis.  Past Medical History: Sara Wilson has a past medical history of Disease of thyroid gland, Hashimoto's disease, and Hyperlipidemia. Social History: No smoking or alcohol. Retired. Was Charity fundraiser. Volunteers in the community.     Physical Exam:BP 110/77 (Site: l a)  - Pulse 76  - Temp 98 ?F (36.7 ?C) (No-touch scanner)  - Resp 18  - Wt  72.6 kg  - SpO2 100%  - BMI 28.34 kg/m? General: Patient appears well, in no acute distressBack: full ROM in the C spine. Preserved lumbar lordosis. Mild tenderness in upper C spine and b/l trapezius. SLR negative. Respiratory: Clear to auscultation bilaterallyCVS: Regular rate and rhythm, S1,S2 nl.GI: deferred.Extremities: no synovitis in UE or LE except for mild swelling and tenderness over lower anterolateral R knee, full ROM. R shoulder with mild lat tenderness, full ROM, positive Hawking test.Neuro: reflexes 1+ in UE; 0 in knees, 1+Achilles, toes downgoing; muscle tone normal, gait normal. Skin: No rashes or lesionsPsychiatric: Mood/affect appropriate, well-kempt      Labs and Imaging:Lab Results Component Value Date  WBC 5.5 03/24/2023  HGB 13.3 03/24/2023  HCT 38.60 03/24/2023  MCV 84.5 03/24/2023  MCH 29.1 03/24/2023  MCHC 34.5 03/24/2023  PLT 188 03/24/2023  MPV 11.2 03/24/2023  NEUTROPHILS 49.0 03/24/2023  LYMPHOCYTES 43.1 03/24/2023  MONOCYTES 6.4 03/24/2023  EOSINOPHILS 0.9 03/24/2023  Lab Results Component Value Date  NA 142 03/24/2023  K 4.2 03/24/2023  CL 105 03/24/2023  CO2 28 03/24/2023  GLU 83 03/24/2023  BUN 21 03/24/2023  CREATININE 0.84 03/24/2023  CALCIUM 10.5 (H) 03/24/2023  ALBUMIN 4.8 03/24/2023  PROT 7.1 03/24/2023  BILITOT 0.4 03/24/2023  ALKPHOS 85 03/24/2023  ALT 20 03/24/2023  AST 24 03/24/2023  GLOB 2.3 03/24/2023  Lab Results Component Value Date  HSCRP 0.2 03/24/2023  HSCRP <0.2 05/28/2022  SEDRATE <1 03/24/2023  SEDRATE 1 05/28/2022  Latest Reference Range & Units 03/24/23 14:12 Rheumatoid Factor <14.0 IU/mL <10.0 ANA <1:80 (Negative)  <1:80 CCP Ab, IgG <7.0 EliA U/mL 1.1 06/2021: ANA 1:80 speckled+ATPO 90Negative ENA, Negative RF/CCP. APLA negative IMAGING:02/13/23 XR knees blFindings/impression:No acute fracture or dislocation.Mild to moderate degenerative changes involving bilateral knee joints, right more than left.Trace joint effusions10/24/24 XR pelvisThere is no acute fracture or dislocation.Degenerative changes involving the visualized lower lumbar spine and sacroiliac joints.Mild degenerative changes involving the hip joints5/19/24 MRI R knee w/woIMPRESSION: 1. Oblique tear of the posterior horn of the lateral meniscus. The medial meniscus, cruciate and collateral ligaments are intact. 2. Mild tricompartmental degenerative chondrosis. No acute osseous findings. 05/21/2022:DG hip unilateral with pelvisCOMPARISON:  [place] abdomen pelvis 09/21/2018 FINDINGS: Mildly decreased bone mineralization. The bilateral sacroiliac and bilateral femoroacetabular joint spaces are maintained. Minimal pubic symphysis joint space narrowing and subchondral sclerosis. No acute fracture or dislocation. Vascular phleboliths overlie the pelvis. IMPRESSION: Minimal pubic symphysis osteoarthritis. No acute fracture.  04/26/22 LUMBAR EPIDURAL INJECTION: An interlaminar approach was performed on the right at L5-S1. The overlying skin was cleansed and anesthetized. A 3.5 inch 20 gauge epidural needle was advanced using loss-of-resistance technique. DIAGNOSTIC EPIDURAL INJECTION: Injection of Isovue-M 200 shows a good epidural pattern with spread above and below the level of needle placement, primarily on the right. No vascular opacification is seen. THERAPEUTIC EPIDURAL INJECTION: 80 mg of Depo-Medrol mixed with 3 mL of 1% lidocaine were instilled. The procedure was well-tolerated, and the patient was discharged thirty minutes following the injection in good condition. COMPLICATIONS: None immediate. IMPRESSION: Technically successful interlaminar epidural injection on the right at L5-S1.   03/26/22 MRI lumbar spine IMPRESSION: 1. Multilevel lumbar spondylosis, slightly progressed at L3-4 and L4-5. 2. Mild canal stenosis at L3-4 and L4-5. 3. Severe right and moderate left foraminal stenosis at L5-S1. 4. Chronic calcified 7 mm lesion within the cauda equina at the L1-2 level is stable and has been present dating to at least 2008. 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  Assessment and Plan:Sara Wilson is a 73  y.o. 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 She had normal inflammatory markers previously and is able to maintain activity and exercise at the gym; there is no nocturnal pain. She had previous low titer ANA which is negative on most recent testing. CRP and ESR are normal. No anemia.  I suspect pain is related to a combination of spinal DJD with myofascial pain in the neck, mild R rotator cuff tendinopathy, DJD of the lumbar spine, and R lateral knee pain is chronic and related to moderate OA and chronic lateral meniscus tear. There is no signs of systemic connective tissue disease. Given chronicity of pain, imaging of the neck is indicated and we will start with neck XR, and consider C spine MRI. She is seeing a spine specialist in February. Will also obtain XR of the R shoulder. Recommended PT for the neck, R shoulder, and R knee pain -- referral to [facility] PT. F/u with Orthopedics Dr. Donnie Coffin 05/08/23.In addition, mild chronic hypercalcemia with elevated PTH -- c/w primary hyperparathyroidism. We discussed the condition and monitoring. PTH was high back in 2018. She has Endocrinology appt 07/16/23 (new patient) for thyroid disease and we will defer further work up and treatment of primary hyperparathyroidism. 25OH vit D normal and 1,25OH vit D is elevated c/w PTH effect. I will ask the patient to stop vit D supplement (she takes 5000 units daily per med list). DEXA in 01/2022 in Care everywhere but I could not see the report -- she will need wrist included in the next study. Patient was seen and d/w Dr. Andree Coss. Rheumatology f/u PRN if new issues arise.  Christ Kick, MDRheumatology fellow

## 2023-05-01 NOTE — Telephone Encounter (Signed)
 Per Trinna Post, we should receive a delivery of a Fedex box/label for return 05/01/2023. He will schedule [employer] to pickup on 05/05/2023.

## 2023-05-02 ENCOUNTER — Encounter: Admit: 2023-05-02 | Payer: PRIVATE HEALTH INSURANCE | Primary: Internal Medicine

## 2023-05-05 ENCOUNTER — Telehealth: Admit: 2023-05-05 | Payer: PRIVATE HEALTH INSURANCE | Attending: Orthopedic Surgery | Primary: Internal Medicine

## 2023-05-05 ENCOUNTER — Encounter: Admit: 2023-05-05 | Payer: PRIVATE HEALTH INSURANCE | Attending: Orthopedic | Primary: Internal Medicine

## 2023-05-05 MED ORDER — ZILRETTA 32 MG INTRA-ARTICULAR SUSPENSION,EXTENDED RELEASE
32 | Freq: Once | INTRA_ARTICULAR | 1 refills | Status: AC
Start: 2023-05-05 — End: ?

## 2023-05-05 NOTE — Telephone Encounter
 Copied from CRM #[id number]. Topic: [facility] General CRM - General Inquiry>> May 05, 2023  2:38 PM Lareina Espino U wrote:Selene from [facility] called to ask for a verbal prescription of Zelretta 32mg  vial, quantity of 1 from Dr. Renelda Loma clinical team.  [facility], but her prescription needs to go to [facility] [phone].

## 2023-05-06 ENCOUNTER — Encounter: Admit: 2023-05-06 | Payer: PRIVATE HEALTH INSURANCE | Attending: Orthopedic Surgery | Primary: Internal Medicine

## 2023-05-06 ENCOUNTER — Telehealth: Admit: 2023-05-06 | Payer: PRIVATE HEALTH INSURANCE | Primary: Internal Medicine

## 2023-05-06 ENCOUNTER — Telehealth: Admit: 2023-05-06 | Payer: PRIVATE HEALTH INSURANCE | Attending: Orthopedic | Primary: Internal Medicine

## 2023-05-06 NOTE — Telephone Encounter
 Copied from CRM #[id number]. Topic: Nursing Triage Note - [facility]>> May 06, 2023  9:56 AM Konrad Dolores wrote:[facility]:  NURSE TRIAGE MESSAGETime of call:   9:56 AMCaller:   Bufford Spikes, MAXINEReceived call from Marisue Ivan, with [facility]. She states that they received an Order for Zilretta for the Patient. She is asking for which Knee. Marisue Ivan can be Contacted at [phone].Jonelle Sidle, [facility] Center Nurse Advisor

## 2023-05-06 NOTE — Telephone Encounter
 Right knee per notes in the chart. Thanks-Kainon Varady

## 2023-05-06 NOTE — Telephone Encounter
 Received [facility] from patient regarding injection appointment. Patient is scheduled with Dr. Donnie Coffin on 1/16

## 2023-05-06 NOTE — Telephone Encounter
 Returned call to [facility]. On hold for 25 minutes. Call transferred twice. Spoke with pharmacist who was requesting additional information including prescribers license number and details of supervising physician. Confirmed this injection is for the right knee M17.11.

## 2023-05-07 ENCOUNTER — Telehealth: Admit: 2023-05-07 | Payer: PRIVATE HEALTH INSURANCE | Attending: Orthopedic Surgery | Primary: Internal Medicine

## 2023-05-07 NOTE — Telephone Encounter
 Sara Wilson was delivered on 03/06/24 to the [place] office. Med was placed in POD 1/2 med room refrigerator.

## 2023-05-08 ENCOUNTER — Encounter: Admit: 2023-05-08 | Payer: PRIVATE HEALTH INSURANCE | Attending: Orthopedic Surgery | Primary: Internal Medicine

## 2023-05-08 DIAGNOSIS — M1711 Unilateral primary osteoarthritis, right knee: Secondary | ICD-10-CM

## 2023-05-08 DIAGNOSIS — E785 Hyperlipidemia, unspecified: Secondary | ICD-10-CM

## 2023-05-08 DIAGNOSIS — H5789 Other specified disorders of eye and adnexa: Secondary | ICD-10-CM

## 2023-05-08 DIAGNOSIS — M1990 Unspecified osteoarthritis, unspecified site: Secondary | ICD-10-CM

## 2023-05-08 DIAGNOSIS — H269 Unspecified cataract: Secondary | ICD-10-CM

## 2023-05-08 DIAGNOSIS — I471 SVT (supraventricular tachycardia) (HC Code): Secondary | ICD-10-CM

## 2023-05-08 DIAGNOSIS — E079 Disorder of thyroid, unspecified: Secondary | ICD-10-CM

## 2023-05-08 DIAGNOSIS — E063 Autoimmune thyroiditis: Secondary | ICD-10-CM

## 2023-05-08 NOTE — Patient Instructions
 ORTHOPAEDICS & REHABILITATIONLee Freddie Breech, M.D.Associate Professor of Orthopaedic SurgeryChief, Division of Adult ReconstructionHip & Knee Arthritis, Total Joint Replacement, & Revision Surgery Amy Haydon-Ryan, PA-CMeghan Apex, PA-C Donato Schultz, [email or web address]: [phone]  	Fax: [phone]	Dr. Renelda Loma Surgical Booking Assistant:  Dorene Grebe : [phone] (HIPS)(Please allow 3 business days to receive a call before calling the Surgical Scheduler)[facility] Diagnostic Radiology Scheduling:	 [phone]. Rubin's Clinical Office Locations:Tuesday AM: [employer], [address], [address] Floor, [place], [place] [address]NGEX[facility]WU AM & PM: [place], [place]: [address] [place], [place]  [address] Mailing Address: [address], [place], [place] [address]ZDG Patient - Focused Information about Orthopaedic Surgery Procedures:[email or web address]     PLEASE sign up for Epic MyChart if you have not already done so. This is a critical step that allows [place] to communicate with you and send electronic prescriptions. You will also be able to view notes, lab / test results, and send secure messages to your clinical care team. You can complete the sign-up process in 3 three easy steps:1) Visit the [facility] MyChart website at [email or web address] and click on the ?Sign Up Now? button. 2) At the next screen, select ?Request Access Code? and complete the enrollment screens.3) Complete the [employer] identify verification process.For COVID TESTING AND VACCINE INFORMATION:[phone])

## 2023-05-09 ENCOUNTER — Encounter: Admit: 2023-05-09 | Payer: PRIVATE HEALTH INSURANCE | Attending: Rheumatology | Primary: Internal Medicine

## 2023-05-09 MED ORDER — TRIAMCINOLONE ACETONIDE ER 32 MG INTRA-ARTICULAR SUSPENSION EXT.RELEAS
32 | Freq: Once | INTRA_ARTICULAR | Status: CP | PRN
Start: 2023-05-09 — End: ?
  Administered 2023-05-08: 19:00:00 32 mg via INTRA_ARTICULAR

## 2023-05-09 NOTE — Progress Notes
 Review of Systems Musculoskeletal:       Follow up for right knee. She is in office today for a zilretta injection. Pain level 4/10 . takes tylenol as needed  All other systems reviewed and are negative.

## 2023-05-09 NOTE — Progress Notes
 ORTHOPAEDICS & REHABILITATION Division of Adult ReconstructionHip & Knee ArthritisTotal Joint Replacement & Revision [email or web address] Name:	Sara Vanderjagt SalazarMR Number:	[id number] Date of Birth:	1952-10-15Date of Visit:	05/08/2023  Sara Wilson is currently 73 y.o.She was last seen on 02/13/2023, Dr. Donnie Coffin, 03/07/2023, Charlette Caffey, PA-C Review of Systems:I have reviewed the ROS, recorded by  the medical assistant at today's visit   Interval History: Sara Wilson is a 73 y.o. female with a past history of right knee OA. She returns today for a planned Zilretta injection in the right knee.Patient Examined With:[name] MD PMR PGY-1Physical Exam: AAOX3, alone for the visit today. Able to stand and walk in the exam room with an alternating stride and no assistive device. The right knee has no sign effusion or erythema this momentThe limb is intact to LT and Motor function gross exam distally.  Imaging:  No imaging today.Formal Radiology Reading follows below:XR Knee Bilateral AP Lateral Axial and Tunnel ([facility]) 02/13/2023:Indication: pain Prior: No previous studies are available for comparison Findings/impression: No acute fracture or dislocation. Mild to moderate degenerative changes involving bilateral knee joints, right more than left. Trace joint effusions. Reported and signed by: Antonieta Loveless, MD  [facility] Radiology and Biomedical Imaging XR Pelvis 1 or 2 Views 02/13/2023:Indication: pain Prior: No previous studies are available for comparison Findings: There is no acute fracture or dislocation. Degenerative changes involving the visualized lower lumbar spine and sacroiliac joints. Mild degenerative changes involving the hip joints. Impression: No acute fracture or dislocation. Degenerative changes. Reported and signed by: Antonieta Loveless, MD  [facility] Radiology and Biomedical Imaging Diagnosis Coding:   ICD-10-CM  1. Primary osteoarthritis of right knee  M17.11 Large Joint Injection  Assessment: With respect to the right knee, mild to moderate arthritis is noted on imaging from 01/2023.After discussion with the patient, the patient will continue to monitor her right knee and can return for a repeat injection as needed.Further Workup / Advanced Imaging Plan: None today. Treatment / Intervention Plan:Large Joint Injection on 05/08/2023 2:00 PMIndications: painDetails: 22 G needle, lateral approachMedications: 32 mg triamcinolone acetonide 32 mgOutcome: tolerated well, no immediate complicationsFollow-Up Plan:The patient will continue to monitor her right knee and will return PRN.Arlana Lindau, M.D., Pola Corn, FAOABoard Certified Diplomate of the American Board of Orthopaedic SurgeryAssociate Professor of Orthopaedic Surgery, [facility] of Medicine	Chief, Tourist information centre manager Adult Reconstruction (Total Joint) ServiceAttending Orthopaedic Surgeon, [facility]	Chief, [facility] Total Joint Replacement ProgramYale Orthopaedics and Rehabilitation	[address]	[place]	[facility] [place]  [address] 	[phone] 		Main Office & Appointments	[phone]		Fax Line	[phone]) [phone])	Assistant Direct LineBilling & Compliance: On the day of this patient's encounter, a total of 15 minutes was personally spent by me.  This time included preparation with record and imaging review, obtaining and reviewing patient history, performing a medically appropriate examination, counseling the patient, family, and/or caregiver, ordering medications, tests, or procedures, referring to and communicating with other healthcare professionals, and documenting all of the above clinical information into the medical record. This does not include any resident/fellow teaching time, or any time spent performing a procedural service. Virtual Scribe Attestation(s):Scribed for Arlana Lindau, MD by Clemencia Course, medical scribe January 17, 2025The documentation recorded by the scribe accurately reflects the services I personally performed and the decisions made by me. I reviewed and confirmed all material entered and/or pre-charted by the scribe.cc:  Patient Care Team:Aloi, Julien Nordmann, MD as PCP - General (Endocrinology, Diabetes & Metabolism)

## 2023-05-14 ENCOUNTER — Inpatient Hospital Stay: Admit: 2023-05-14 | Discharge: 2023-05-14 | Payer: PRIVATE HEALTH INSURANCE | Primary: Internal Medicine

## 2023-05-14 DIAGNOSIS — M47812 Spondylosis without myelopathy or radiculopathy, cervical region: Secondary | ICD-10-CM

## 2023-05-14 DIAGNOSIS — M25511 Pain in right shoulder: Secondary | ICD-10-CM

## 2023-05-14 DIAGNOSIS — M2550 Pain in unspecified joint: Secondary | ICD-10-CM

## 2023-05-15 NOTE — Telephone Encounter
 Referral faxed

## 2023-05-28 ENCOUNTER — Telehealth: Admit: 2023-05-28 | Payer: PRIVATE HEALTH INSURANCE | Attending: Rheumatology | Primary: Internal Medicine

## 2023-05-28 NOTE — Progress Notes
 Attending StatementMaxine Wilson [name] is a 73 yo woman with h/o Hashimoto thyroiditis, weakly +ANA, primary hyperparathyroidism, osteoarthritis, widespread musculoskeletal pain, who is referred for evaluation by rheumatology.  Her history is well described in the medical record and accompanying note. She returns today with increased neck and lower back pain, R shoulder and R knee pain. She reports short-lived AM stiffness <30 min, and no nighttime unusual stiffness or pain. ROS otherwise is as noted. I have reviewed the history, have seen and personally have examined the patient during a shared visit with the rheumatology fellow. On exam, she appears well, in NAD, VSS. No acute rashes, ocular inflammation, adenopathy are noted. Mild tenderness of the posterior neck and trapezius muscle. Lungs are clear. Cardiac exam is regular. Abdomen is soft, NT. Lumbar spine shows no focal spinal tenderness.. Extremities show mild tenderness at the R deltoid, R knee, w/o synovitis, effusions, restricted ROM in peripheral joints. Distal digits are well perfused. I have confirmed the rest of the physical findings as described. We have reviewed the pertinent studies in EPIC as outlined in the the note from today. In addition, we have discussed the plan of care and medical decision making. I agree with the assessment and recommendations as outlined by Dr. Salome Spotted. The patient was counseled on the clinical issues and management plan, and care was coordinated as needed. She has additional appointments scheduled with orthopedics and and endocrinology. We do not plan specific follow up with rheumatology, although we would be happy to see her again upon request.Bernice Mcauliffe Andree Coss, MD [facility] RHEUMATOLOGY [address] [place], [place] [address] Phone: [phone] Fax: [phone] [email or web address]

## 2023-05-28 NOTE — Telephone Encounter
 Answering service message received: Sara Wilson from [employer] called to advise that they received referral and would like to updaye that they got started and she is doing well so far. If there are any questions please call [phone].

## 2023-06-02 ENCOUNTER — Encounter: Admit: 2023-06-02 | Payer: PRIVATE HEALTH INSURANCE | Attending: Cardiovascular Disease | Primary: Internal Medicine

## 2023-06-02 ENCOUNTER — Ambulatory Visit: Admit: 2023-06-02 | Payer: PRIVATE HEALTH INSURANCE | Attending: Cardiovascular Disease | Primary: Internal Medicine

## 2023-06-02 VITALS — BP 130/60 | HR 60 | Ht 63.0 in | Wt 162.8 lb

## 2023-06-02 DIAGNOSIS — I471 SVT (supraventricular tachycardia) (HC Code): Secondary | ICD-10-CM

## 2023-06-02 DIAGNOSIS — E063 Autoimmune thyroiditis: Secondary | ICD-10-CM

## 2023-06-02 DIAGNOSIS — E079 Disorder of thyroid, unspecified: Secondary | ICD-10-CM

## 2023-06-02 DIAGNOSIS — E785 Hyperlipidemia, unspecified: Secondary | ICD-10-CM

## 2023-06-02 DIAGNOSIS — I10 Essential (primary) hypertension: Secondary | ICD-10-CM

## 2023-06-02 DIAGNOSIS — E782 Mixed hyperlipidemia: Secondary | ICD-10-CM

## 2023-06-02 DIAGNOSIS — H5789 Other specified disorders of eye and adnexa: Secondary | ICD-10-CM

## 2023-06-02 DIAGNOSIS — M1990 Unspecified osteoarthritis, unspecified site: Secondary | ICD-10-CM

## 2023-06-02 DIAGNOSIS — R011 Cardiac murmur, unspecified: Secondary | ICD-10-CM

## 2023-06-02 DIAGNOSIS — H269 Unspecified cataract: Secondary | ICD-10-CM

## 2023-06-02 NOTE — Progress Notes
 [facility] Heart and VascularOUTPATIENT CONSULTATION NOTEConsult Information:Consultation requested by: Emerson Monte, MDReason for referral : pSVTPresentation History:Sara Wilson is a 73 y.o. female with past medical history significant for:1. H/o SVT s/p ablation 20022. HLD3. Hashimotos disease - dx'd around [address]81. OA5. HTNwho is referred to establish cardiac care.  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 Underwent ablation for SVT at [facility] in 2002 (Dr Joesph July). Since then she has had infrequent episodes of SVT. Many of her episodes have occurred following surgical procedures after administration of anesthesia. She has had an echocardiogram on 02/02/13 which showed normal left ventricular function with ejection fraction 55-60% and no significant abnormalities. Cardiac monitor 12/17/2017 showed normal sinus rhythm, sinus bradycardia, sinus tachycardia with some nonsustained atrial tachycardia up to 13 beats and occasional PVCs with PVC burden of 1%. Heart rate ranged from 42 to 185 bpm. Has been followed by Dr. Elberta Fortis with last visit in 2020 where she was doing well and was continued on her metoprolol.During visit on 06/19/20, the patient was having elevated blood pressures running 130s-170s. Also had episode of SVT in 04/2020. Following our visit on 06/23/20, she called the on-call line due to HR 142. She was instructed to take additional doses of her metop at that time. She was seen in EP clinic on 06/30/20 where she was recommended for possible ablation if symptoms recurred.During visit on 09/21/20, the patient had another episode of SVT after a dental procedure.She went to [facility] where HR were mildly elevated at that time. Received IVF and toradol with resolution of symptoms and was discharged home. Ca score was also obtained on 10/31/20 which was 0. During visit on 06/07/2021, the patient was suffering with episodes of mouth dryness, fatigue and numbness and was being worked up for possible rheumatologic condition. She was also having brief runs of SVT at that time. TTE 06/15/21 for systolic murmur showed normal BiV function with no evidence of valve diseaseWas last seen in clinic on 09/2022 by Dr. Elberta Fortis where she was doing well without significant CV symptoms.She reports that when she gets dehydrated, her mouth feels dry and then she gets fatigued and then she will feel pulsations in her neck and she knows that this is leading to SVT.  When she is in SVT she feels LH, SOB, fatigued and anxious.  She reports that in the past year she has only had one episode of SVT - lasts 15-30 min.  She reports that when she gets the SVT it typically resolves with getting IVF.  She reports that vagal maneuvers sometimes will help.   She reports that she will also have a sensation of her heart beating fast and fluttery which lasts for 5-10 secs and then resolves.  This occurs about 1x per month.  These sx are different than the sx she gets when she is in SVT.  She has been having this off and on for the past several years.  This is not too bothersome to her.   She does have an Apple Watch but she does not use it routinelyShe reports that she has never had chest discomfort or SOB.  She reports that she is not as active as she was in [place] because of knee issues.  She was exercising 3-4x per week with Silver Sneakers.  She just got a knee injection 2-3 weeks ago and she started going back to the  gym -- aerobics, balance, light wts, dancing.  She reports that the main functional limitation is knee pain -- this is controlled with the knee injection.  No LH or dizziness.  No syncope or presyncope.  No LE edema.  Monitors bps at  home -- typicalls 110s/60s HR 60sOf note: she has had myalgias on lipitor.  She also did not tolerate repatha in the past.  She is tolerating nexlizet and pravastatinShx: no tob.  Very etoh, currently living with her sister but she is looking for her own home.  4 sisters live in [place] and a 2 daughters live in [place], retied nurseFhx: mother - CABG/MVR in 40s, stroke died age 1, HTN; Father - PE, HTN, glaucoma; 5 siblings - Oldest Brother - CAD, Sister - sarcoidosisPast Cardiac ZO:[id number] Artery Disease:noneValvular Heart Disease:noneArrhythmia:pSVTPacemaker/ICD UJ:[id number] Past Medical Hx:PMH PSH  She   has a past medical history of Cataracts, bilateral, Disease of thyroid gland, Hashimoto's disease, Hyperlipidemia, Osteoarthritis (2010), SVT (supraventricular tachycardia) (HC Code), and Thyroid eye disease (2019). She  has a past surgical history that includes Carpal tunnel release (Right, 12/21/2012); Cardiac electrophysiology mapping and ablation; Cesarean section; Breast surgery; Cataract extraction w/  intraocular lens implant (Bilateral, 2022); and lower lid lift (2022). Social History Family History She  reports that she has never smoked. She does not have any smokeless tobacco history on file. She reports that she does not drink alcohol and does not use drugs. Family History Problem Relation Age of Onset  Heart failure Mother   Stroke Mother 50  Hypertension Mother   Transient ischemic attack Father   Sarcoidosis Sister   Metabolic syndrome Brother   Asthma Maternal Grandfather   Medications Current Outpatient Medications Medication Sig Dispense Refill  carboxymethylcellulose (REFRESH PLUS) 0.5 % ophthalmic solution in dropperette Apply 1 drop to eye.    EPINEPHrine (EPI-PEN) 0.3 mg/0.3 mL auto-injector Inject 0.3 mg into the muscle once as needed.    lactobacillus rhamnosus, GG, (CULTURELLE) 10 billion cell capsule Take 1 capsule by mouth daily.    levothyroxine (SYNTHROID, LEVOTHROID) 75 MCG tablet Take 1 tablet (75 mcg total) by mouth daily.    losartan (COZAAR) 25 mg tablet Take 1 tablet (25 mg total) by mouth.    metoprolol succinate XL (TOPROL-XL) 50 mg 24 hr tablet Take 1 tablet (50 mg total) by mouth daily.    NEXLIZET 180-10 mg Tab     polyethylene glycol (MIRALAX) 17 gram packet Take 1 packet (17 g total) by mouth daily.    pravastatin (PRAVACHOL) 40 mg tablet     tretinoin (RETIN-A) 0.05 % cream APPLY TO AFFECTED AREA EVERY DAY AT BEDTIME   No current facility-administered medications for this visit.  Allergies She is allergic to lipitor [atorvastatin], synvisc [hylan g-f 20], and epinephrine. Review of Systems:All 14 point review of systems are reviewed and are negative except for the HPI. Physical Exam:Vitals:Vitals:  06/02/23 1344 BP: 130/60 Site: Right Arm Position: Sitting Cuff Size: Medium Pulse: 60 SpO2: 99% Weight: 73.8 kg Height: 5' 3 (1.6 m)  Exam:Physical ExamConstitutional:     Appearance: Normal appearance. HENT:    Head: Normocephalic and atraumatic. Neck:    Vascular: No carotid bruit or JVD. Cardiovascular:    Rate and Rhythm: Normal rate and regular rhythm.    Pulses: Normal pulses.    Heart sounds: Murmur (1-2 out 6 SM at LUSB/LLSB) heard. Pulmonary:    Effort: Pulmonary effort is normal.    Breath sounds: Normal breath sounds. No wheezing, rhonchi or rales.  Abdominal:    General: Abdomen is flat. Bowel sounds are normal.    Palpations: Abdomen is soft. Musculoskeletal:    Right lower leg: No edema.    Left lower leg: No edema. Skin:   General: Skin is warm and dry. Neurological:    General: No focal deficit present.    Mental Status: She is alert and oriented to person, place, and time. Mental status is at baseline. Review of Labs/Diagnostics:LABSLipid Panel:12/20/22: TC 114 TG 93 HDL 48 LDL 48Total Cholesterol No results found for: CHOL LDL No results found for: LDL HDL No results found for: HDL Triglycerides No results found for: TRIG  Complete Metabolic Panel:Sodium Sodium (mmol/L) Date Value 03/24/2023 142 11/28/2021 141  Potasium Potassium (mmol/L) Date Value 03/24/2023 4.2 11/28/2021 3.6  Chloride Chloride (mmol/L) Date Value 03/24/2023 105 11/28/2021 106  Bicarb CO2 (mmol/L) Date Value 03/24/2023 28 11/28/2021 23  BUN BUN (mg/dL) Date Value 54/12/8117 21 11/28/2021 12  Creatinine Creatinine (mg/dL) Date Value 14/78/2956 0.84 11/28/2021 0.85  Glucose Glucose (mg/dL) Date Value 21/30/8657 83 11/28/2021 95  Calcium No results found for: CALC Uric Acid No results found for: URICACID AST Aspartate Aminotransferase (AST) (U/L) Date Value 03/24/2023 24 11/28/2021 21  ALT Alanine Aminotransferase (ALT) (U/L) Date Value 03/24/2023 20 11/28/2021 21  TSH Thyroid Stimulating Hormone (?IU/mL) Date Value 11/28/2021 0.733  HgA1C No results found for: HGBA1C BNP No results found for: BNPPRO  Complete Blood Count:WBC WBC (x1000/?L) Date Value 03/24/2023 5.5 11/28/2021 5.1  Hemoglobin Hemoglobin (g/dL) Date Value 84/69/6295 13.3 11/28/2021 13.7  Hematocrit Hematocrit (%) Date Value 03/24/2023 38.60 11/28/2021 37.50  Platelets Platelets (x1000/?L) Date Value 03/24/2023 188 11/28/2021 149 (L)   PT/INRNo results found for: INR, PROTIMEPast Cardiac Data:EKG: 2/10/25ECHO: TTE 2/23IMPRESSIONS  1. Left ventricular ejection fraction, by estimation, is 60 to 65%. The left ventricle has normal function. The left ventricle has no regional wall motion abnormalities. Left ventricular diastolic parameters were normal.  2. Right ventricular systolic function is normal. The right ventricular size is normal. There is normal pulmonary artery systolic pressure. The estimated right ventricular systolic pressure is 22.9 mmHg.  3. The mitral valve is normal in structure. Trivial mitral valve regurgitation. No evidence of mitral stenosis.  4. The aortic valve is tricuspid. Aortic valve regurgitation is trivial. Aortic valve sclerosis is present, with no evidence of aortic valve stenosis.  5. The inferior vena cava is normal in size with greater than 50% respiratory variability, suggesting right atrial pressure of 3 mmHg. STRESS TESTS:CARDIAC CATHS:HOLTER MONITOR:8/14SINUS RHYTHM WITH RARE PREMATURE ATRIAL COMPLEXES AND PREMATURE VENTRICULAR COMPLEXES. EVENT MONITOR:8/19? Normal sinus rhythm, sinus bradycardia and sinus tachycardia with average heart rate 73 bpm. Heart rate ranged from 42 to 185 bpm. ? Occasional PVCs with PVC burden 1%. ? Nonsustained atrial tachycardia up to 13 beats. CAROTID DOPPLER:No results found for this or any previous visit.CORONARY CALCIUM SCORE:7/22Coronary Calcium Score: Left main: 0 Left anterior descending artery: 0 Left circumflex artery: 0 Right coronary artery: 0 Total: 0 OTHER DIAGNOSTIC IMAGING:N/aAssessment/Impression:1. pSVT -- pt has a long standing history of SVT (since her 30s).  She did have SVT  ablation in 2002.  Since that time her sx are fairly well controlled but she does still get rare episodes of SVT typically triggered by dehydration.  She will note this about 1x per year.  For now will cont with metoprolol to help prevent recurrence.  I have advised her that if she has recurrent sx to get an ECG with her Apple Watch.  If  the sx start to become more frequent, will then refer back to EP for evaluation for possible repeat ablation.  She is in agreement with the plan--Cont metoprolol--Reviewed vagal maneuvers with her2. HTN: bp is well controlled  Cont current meds3. HLD: did not tolerate lipitor or repatha in the past.  Tolerating bempedoic acid-zeita and pravastatin with excellent lipid control.  Will cont current meds4. Murmur - will get baseline echo to assess (had aortic sclerosis on prior echos)Recommendations:Cont current medsTTERTC 6 months or sooner if new issues arise.  If doing well at that time will then see on annual basisRebecca Caytlyn Evers, MDAssistant ProfessorInterventional CardiologyYale School of Medicine2/01/2024

## 2023-06-05 ENCOUNTER — Inpatient Hospital Stay: Admit: 2023-06-05 | Discharge: 2023-06-05 | Payer: PRIVATE HEALTH INSURANCE | Primary: Internal Medicine

## 2023-06-05 DIAGNOSIS — R011 Cardiac murmur, unspecified: Principal | ICD-10-CM

## 2023-06-06 ENCOUNTER — Telehealth: Admit: 2023-06-06 | Payer: PRIVATE HEALTH INSURANCE | Attending: Cardiovascular Disease | Primary: Internal Medicine

## 2023-06-06 NOTE — Other
 Please let her know that her echo looks good.  She has mild MR and TR which likely account for the murmur on exam.  She also has a hyperdynamic LV which can contribute to the murmur.  Please make sure she is staying well hydrated

## 2023-06-06 NOTE — Telephone Encounter
 Spoke with PTMessage givenShe verbalized understanding----- Message from Cecilie Kicks, MD sent at 06/06/2023 10:53 AM EST -----Please let her know that her echo looks good.  She has mild MR and TR which likely account for the murmur on exam.  She also has a hyperdynamic LV which can contribute to the murmur.  Please make sure she is staying well hydrated

## 2023-06-12 ENCOUNTER — Encounter: Admit: 2023-06-12 | Payer: PRIVATE HEALTH INSURANCE | Attending: Adult Health | Primary: Internal Medicine

## 2023-06-12 ENCOUNTER — Inpatient Hospital Stay: Admit: 2023-06-12 | Discharge: 2023-06-12 | Payer: PRIVATE HEALTH INSURANCE | Primary: Internal Medicine

## 2023-06-12 DIAGNOSIS — M542 Cervicalgia: Secondary | ICD-10-CM

## 2023-06-12 DIAGNOSIS — M47812 Spondylosis without myelopathy or radiculopathy, cervical region: Secondary | ICD-10-CM

## 2023-06-12 NOTE — Progress Notes
 [facility] Phone: [phone], Fax: [phone], [address] [place], [place] [address], [place], [place] Name: Sara Younger SalazarDate of Birth: 04-Oct-1952Date of Visit: 2/20/2025History of present illness:Sara Wilson is a 73 y.o. female with a PMH significant for HTN, CTS of right wrist, hearing loss, hypothyroidism, laryngopharyngeal reflux, PSVT, HLD, and osteopenia. Patient presents today for evaluation of neck pain. She reports chronic neck pain with radiation to B/L shoulders without inciting event or trauma. She denies radiation to either arms or associated paresthesias. Pain is increased with neck ROM and prolonged sitting. Pain is improved with rest, heat, ice, and gentle stretching. She is RHD. No assistive devices for ambulation. She is retired, previously was a Psychiatric nurse. No tobacco or drug use. She reports axial low back pain with PT without radicular symptoms. Interventions to date:Meds - Tylenol PRN, Motrin PRN; some reliefPT - Currently enrolled at [facility] in [place] for the past month with ongoing HEP; some reliefCSI - Cervical ESI 5-6 years ago; no reliefLumbar ESI in the past; some reliefAlternative treatments - Chiropractor and acupuncture weekly for the past 3-4 months; transient reliefIce, heat; some reliefOther providers:Dr. Donnie Coffin - Ortho Joint (Knee)Dr. Scandrett - CardiologyDr. Andree Coss - RheumatologyDenies any reg flag symptoms at this time. No bladder or bowel dysfunction, no saddle anesthesia. No balance issues or falls. No fine motor dysfunction. Denies previous spine surgery. No other complaints.Past Medical History:She  has a past medical history of Cataracts, bilateral, Disease of thyroid gland, Hashimoto's disease, Hyperlipidemia, Osteoarthritis (2010), SVT (supraventricular tachycardia) (HC Code), and Thyroid eye disease (2019). Past Surgical History: She  has a past surgical history that includes Carpal tunnel release (Right, 12/21/2012); Cardiac electrophysiology mapping and ablation; Cesarean section; Breast surgery; Cataract extraction w/  intraocular lens implant (Bilateral, 2022); and lower lid lift (2022).Medications:Current Outpatient Medications Medication Sig Dispense Refill  carboxymethylcellulose (REFRESH PLUS) 0.5 % ophthalmic solution in dropperette Apply 1 drop to eye.    EPINEPHrine (EPI-PEN) 0.3 mg/0.3 mL auto-injector Inject 0.3 mg into the muscle once as needed.    lactobacillus rhamnosus, GG, (CULTURELLE) 10 billion cell capsule Take 1 capsule by mouth daily.    levothyroxine (SYNTHROID, LEVOTHROID) 75 MCG tablet Take 1 tablet (75 mcg total) by mouth daily.    losartan (COZAAR) 25 mg tablet Take 1 tablet (25 mg total) by mouth.    metoprolol succinate XL (TOPROL-XL) 50 mg 24 hr tablet Take 1 tablet (50 mg total) by mouth daily.    NEXLIZET 180-10 mg Tab     polyethylene glycol (MIRALAX) 17 gram packet Take 1 packet (17 g total) by mouth daily.    pravastatin (PRAVACHOL) 40 mg tablet     tretinoin (RETIN-A) 0.05 % cream APPLY TO AFFECTED AREA EVERY DAY AT BEDTIME   No current facility-administered medications for this visit.  Allergies: She is allergic to lipitor [atorvastatin], synvisc [hylan g-f 20], and epinephrine.Family History: family history includes Asthma in her maternal grandfather; Heart failure in her mother; Hypertension in her mother; Metabolic syndrome in her brother; Sarcoidosis in her sister; Stroke (age of onset: 60) in her mother; Transient ischemic attack in her father.Social history: She  reports no history of drug use.She  reports no history of alcohol use.She  reports that she has never smoked. She does not have any smokeless tobacco history on file.Review of Systems: I have reviewed the review of systems with the patient, as noted by the clinical support staff.  This complete review of symptoms helps to identify any  additional risk in surgical consideration.Physical Examination: There were no vitals taken for this visit.General Appearance - Well developed, well nourishedMood/Affect - NormalApparent Distress - NoneSkin - Normal temperature, turgor, color, and moisture with no echhymosis, cyanosis, clubbing, or hyper/hypopigmented lesions Cervical spine - No tenderness to palpation over cervical spine- Tender to palpation B/L trapezius- No previous incisions- No restriction with range of motion - flexion, extension, rotation- Some pain with range of motion- flexion, extension, rotation Thoracolumbar Spine- No tenderness to palpation over spine or buttocks- No previous incisions- No restriction in thoracolumbar flexion, extension, rotation, or lateral bending- No pain in back or legs with flexion or extension- No elevation of shoulders or pelvis ExtremitiesUpper: - No pain with shoulder motion - No edema, swelling, varicosities, lymphadenopathy Lower:- Negative straight leg raise- No edema, swelling, varicosities, lymphadenopathy Neurologic -Gait:       Non-antalgic gait       No difficulty with heel-to-toe tandem gait      Stands unassisted with appropriate sagittal and coronal balance.       Does not require assistive device for ambulation Rectal Tone/Perineal Sensation Deferred d/t lack of bowel/bladder complaints   R L  C5 (deltoids) 5 /5 5 /5 C5 (biceps) 5 /5 5 /5  C6 (wrist ext) 5 /5 5 /5 C7 (triceps)  5 /5 5 /5 C8 (finger flexors)  5 /5 5 /5    R L L2 (Hip flexors) 5 /5 5 /5 L3 (quad/Knee ext) 5 /5 5 /5 L4 (Tibialis ant) 5 /5 5 /5  L5(EHL) 5 /5 5 /5 S1 (Gastroc) 5 /5 5 /5  Sensation:  R L C5 (Lateral arm) + + C6 (Thumb) + + C7 (Long finger) + + C8 (Small finger) + + T1 (Medial elbow) + +    R L L2 (Medial thigh) + + L3 (Medial knee) + + L4 (Medial ankle) + + L5 (1st web space) + + S1 (Lateral heel) + +  Reflexes:  R L C5 (Biceps) + + C6 (Brachioradialis) + + L4 (Patellar) + + S1 (Achilles) - -                Negative Hoffman?s signs. No evidence of long tract signs               No clonusImaging:  XR Cervical Spine AP and Lateral (05/14/2023)FINDINGS: Vertebral body height appears maintained.There is a small right accessory rib at the C7. Linear metallic densities projecting at the level of T1 and T2 only seen on the lateral view are likely overlying the patient.Straightened cervical lordosis. There is minimal anterolisthesis of C4 on C5. Variable degrees of disc space narrowing, end plate sclerosis and osteophytosis is present throughout the cervical spine, notably at C5-6. Variable degrees of uncovertebral and facet arthrosis is present  throughout the cervical spine. 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 Reported and signed by: Ricci Barker, MD  Assessment/Plan:  Hero Mccathern is a 73 y.o. female with a PMH significant for HTN, CTS of right wrist, hearing loss, hypothyroidism, laryngopharyngeal reflux, PSVT, HLD, and osteopenia. Patient presents today for evaluation of neck pain in the setting of moderate multilevel cervical spondylosis and DDD. Given the patient is experiencing ongoing symptoms despite PT, HEP, chiropractor, acupuncture, and medications as above, I have ordered MRI Cervical Spine for further assessment of anatomy. Patient declines referral to Pain Medicine at this time and will continue with other conservative measures in the interim. Red  flag symptoms, including bladder/bowel incontinence, saddle anesthesia, weakness in arms/legs, worsening/new balance issues, fall, and head strike, reviewed with patient in room and advised to proceed to the ED should these develop in the interim. Interventions: MRI Cervical SpineFollow up: after MRI for reviewConservative: advised the use of non-pharmacological pain modalities including heat, ice, massage, position change, activity modification and mental health exercises including deep breathing, relaxation and distraction.Discussion was had with the patient regarding diagnosis and differential diagnosis. Risk and benefits of all treatment options were discussed with the patient. All questioned were answered and patient verbalized understanding of plan.  Electronically signed by: Madi Bonfiglio DNP, APRN AGNP-BC

## 2023-06-17 ENCOUNTER — Encounter: Admit: 2023-06-17 | Payer: PRIVATE HEALTH INSURANCE | Attending: Adult Health | Primary: Internal Medicine

## 2023-06-18 ENCOUNTER — Encounter: Admit: 2023-06-18 | Payer: PRIVATE HEALTH INSURANCE | Attending: Adult Health | Primary: Internal Medicine

## 2023-06-24 ENCOUNTER — Inpatient Hospital Stay: Admit: 2023-06-24 | Discharge: 2023-06-24 | Payer: PRIVATE HEALTH INSURANCE | Primary: Internal Medicine

## 2023-06-24 ENCOUNTER — Encounter: Admit: 2023-06-24 | Payer: PRIVATE HEALTH INSURANCE | Attending: Adult Health | Primary: Internal Medicine

## 2023-06-25 ENCOUNTER — Ambulatory Visit: Admit: 2023-06-25 | Payer: PRIVATE HEALTH INSURANCE | Attending: Adult Health | Primary: Internal Medicine

## 2023-06-25 ENCOUNTER — Encounter: Admit: 2023-06-25 | Payer: PRIVATE HEALTH INSURANCE | Attending: Adult Health | Primary: Internal Medicine

## 2023-06-25 ENCOUNTER — Encounter: Admit: 2023-06-25 | Payer: PRIVATE HEALTH INSURANCE | Primary: Internal Medicine

## 2023-06-25 VITALS — BP 131/77 | HR 78

## 2023-06-25 DIAGNOSIS — H5789 Other specified disorders of eye and adnexa: Secondary | ICD-10-CM

## 2023-06-25 DIAGNOSIS — E079 Disorder of thyroid, unspecified: Secondary | ICD-10-CM

## 2023-06-25 DIAGNOSIS — M1990 Unspecified osteoarthritis, unspecified site: Secondary | ICD-10-CM

## 2023-06-25 DIAGNOSIS — E785 Hyperlipidemia, unspecified: Secondary | ICD-10-CM

## 2023-06-25 DIAGNOSIS — I471 SVT (supraventricular tachycardia) (HC Code): Secondary | ICD-10-CM

## 2023-06-25 DIAGNOSIS — M47812 Spondylosis without myelopathy or radiculopathy, cervical region: Secondary | ICD-10-CM

## 2023-06-25 DIAGNOSIS — E063 Autoimmune thyroiditis: Secondary | ICD-10-CM

## 2023-06-25 DIAGNOSIS — H269 Unspecified cataract: Secondary | ICD-10-CM

## 2023-06-25 NOTE — Progress Notes
 Review of Systems Musculoskeletal:  Positive for neck pain.      4/10 Rolly Pancake, RN, June 25, 2023

## 2023-06-25 NOTE — Progress Notes
 [facility] Phone: [phone], Fax: [phone], [address] [place], [place] [address], [place], [place] Name: Sara Younger SalazarDate of Birth: 30-Mar-1952Date of Visit: 3/5/2025History of present illness:Sara Wilson is a 73 y.o. female with a PMH significant for HTN, CTS of right wrist, hearing loss, hypothyroidism, laryngopharyngeal reflux, PSVT, HLD, and osteopenia. Patient presents today for re-evaluation of neck pain. Last visit (06/12/2023):She reports chronic neck pain with radiation to B/L shoulders without inciting event or trauma. She denies radiation to either arms or associated paresthesias. Pain is increased with neck ROM and prolonged sitting. Pain is improved with rest, heat, ice, and gentle stretching. She is RHD. No assistive devices for ambulation. She is retired, previously was a Psychiatric nurse. No tobacco or drug use. She reports axial low back pain with PT without radicular symptoms. Given the patient is experiencing ongoing symptoms despite PT, HEP, chiropractor, acupuncture, and medications as above, I have ordered MRI Cervical Spine for further assessment of anatomy. Patient declines referral to Pain Medicine at this time and will continue with other conservative measures in the interim. Red flag symptoms, including bladder/bowel incontinence, saddle anesthesia, weakness in arms/legs, worsening/new balance issues, fall, and head strike, reviewed with patient in room and advised to proceed to the ED should these develop in the interim. She reports no significant change since last evaluation. She continues to experience axial neck pain with radiation to B/L trapezius muscles. No assistive devices for ambulation. Interventions to date:Meds - Tylenol PRN, Motrin PRN; some reliefPT - Currently enrolled at [facility] in [place] since January 2025 with ongoing HEP; some reliefCSI - Cervical ESI 5-6 years ago; no reliefLumbar ESI in the past; some reliefAlternative treatments - Chiropractor and acupuncture weekly for the past 3-4 months; transient reliefIce, heat; some reliefOther providers:Dr. Donnie Coffin - Ortho Joint (Knee)Dr. Scandrett - CardiologyDr. Andree Coss - RheumatologyDenies any reg flag symptoms at this time. No bladder or bowel dysfunction, no saddle anesthesia. No balance issues or falls. No fine motor dysfunction. Denies previous spine surgery. No other complaints.Past Medical History:She  has a past medical history of Cataracts, bilateral, Disease of thyroid gland, Hashimoto's disease, Hyperlipidemia, Osteoarthritis (2010), SVT (supraventricular tachycardia) (HC Code), and Thyroid eye disease (2019). Past Surgical History: She  has a past surgical history that includes Carpal tunnel release (Right, 12/21/2012); Cardiac electrophysiology mapping and ablation; Cesarean section; Breast surgery; Cataract extraction w/  intraocular lens implant (Bilateral, 2022); and lower lid lift (2022).Medications:Current Outpatient Medications Medication Sig Dispense Refill  amoxicillin-clavulanate (AUGMENTIN) 875-125 mg per tablet Take 1 tablet by mouth every 12 (twelve) hours for 10 days. 20 tablet 0  carboxymethylcellulose (REFRESH PLUS) 0.5 % ophthalmic solution in dropperette Apply 1 drop to eye.    EPINEPHrine (EPI-PEN) 0.3 mg/0.3 mL auto-injector Inject 0.3 mg into the muscle once as needed.    erythromycin (ROMYCIN) 5 mg/gram (0.5 %) ophthalmic ointment APPLY SMALL AMOUNT TO SURGICAL SITE THREE TIMES PER DAY    lactobacillus rhamnosus, GG, (CULTURELLE) 10 billion cell capsule Take 1 capsule by mouth daily.    levothyroxine (SYNTHROID, LEVOTHROID) 75 MCG tablet Take 1 tablet (75 mcg total) by mouth daily.    losartan (COZAAR) 25 mg tablet Take 1 tablet (25 mg total) by mouth.    metoprolol succinate XL (TOPROL-XL) 50 mg 24 hr tablet Take 1 tablet (50 mg total) by mouth daily. NEXLIZET 180-10 mg Tab     polyethylene glycol (MIRALAX) 17 gram packet Take 1 packet (17 g total) by mouth daily.  pravastatin (PRAVACHOL) 40 mg tablet     promethazine-dextromethorphan (PROMETHAZINE-DM) 6.25-15 mg/5 mL syrup Take 5 mLs by mouth every 4 (four) hours as needed for cough. 150 mL 0  tretinoin (RETIN-A) 0.05 % cream APPLY TO AFFECTED AREA EVERY DAY AT BEDTIME    VEVYE 0.1 % Drop PLACE 1 DROP IN EACH EYE TWICE DAILY AS DIRECTED   No current facility-administered medications for this visit.  Allergies: She is allergic to diphenhydramine, lidocaine, alirocumab, evolocumab, lipitor [atorvastatin], synvisc [hylan g-f 20], benadryl allergy decongestant, and epinephrine.Family History: family history includes Asthma in her maternal grandfather; Heart failure in her mother; Hypertension in her mother; Metabolic syndrome in her brother; Sarcoidosis in her sister; Stroke (age of onset: 41) in her mother; Transient ischemic attack in her father.Social history: She  reports no history of drug use.She  reports no history of alcohol use.She  reports that she has never smoked. She does not have any smokeless tobacco history on file.Review of Systems: I have reviewed the review of systems with the patient, as noted by the clinical support staff.  This complete review of symptoms helps to identify any additional risk in surgical consideration.Physical Examination: BP 131/77  - Pulse 78 General Appearance - Well developed, well nourishedMood/Affect - NormalApparent Distress - NoneSkin - Normal temperature, turgor, color, and moisture with no echhymosis, cyanosis, clubbing, or hyper/hypopigmented lesions Cervical spine - No tenderness to palpation over cervical spine- Tender to palpation B/L trapezius- No previous incisions- No restriction with range of motion - flexion, extension, rotation- Some pain with range of motion- flexion, extension, rotation ExtremitiesUpper: - No pain with shoulder motion - No edema, swelling, varicosities, lymphadenopathy Neurologic -Gait:       Non-antalgic gait       Stands unassisted with appropriate sagittal and coronal balance.       Does not require assistive device for ambulation Rectal Tone/Perineal Sensation Deferred d/t lack of bowel/bladder complaints   R L  C5 (deltoids) 5 /5 5 /5 C5 (biceps) 5 /5 5 /5  C6 (wrist ext) 5 /5 5 /5 C7 (triceps)  5 /5 5 /5 C8 (finger flexors)  5 /5 5 /5   Sensation:  R L C5 (Lateral arm) + + C6 (Thumb) + + C7 (Long finger) + + C8 (Small finger) + + T1 (Medial elbow) + +   Reflexes:  R L C5 (Biceps) + + C6 (Brachioradialis) + +               Negative Hoffman?s signs. No evidence of long tract signsImaging:  MRI Cervical Spine wo IV Contrast, OSH (06/17/2023)XR Cervical Spine AP and Lateral (05/14/2023)FINDINGS: Vertebral body height appears maintained.There is a small right accessory rib at the C7. Linear metallic densities projecting at the level of T1 and T2 only seen on the lateral view are likely overlying the patient.Straightened cervical lordosis. There is minimal anterolisthesis of C4 on C5. Variable degrees of disc space narrowing, end plate sclerosis and osteophytosis is present throughout the cervical spine, notably at C5-6. Variable degrees of uncovertebral and facet arthrosis is present  throughout the cervical spine. 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 Reported and signed by: Ricci Barker, MD  Assessment/Plan:  Sara Wilson is a 73 y.o. female with a PMH significant for HTN, CTS of right wrist, hearing loss, hypothyroidism, laryngopharyngeal reflux, PSVT, HLD, and osteopenia. Patient presents today for re-evaluation of neck pain in the setting of MRI Cervical Spine February  2025 revealing up to mild canal stenosis, multilevel DD, facet arthropathy, and C5-6 severe B/L neural foraminal stenosis. Given axial symptoms and lack of canal stenosis, I have recommended non-surgical measures. The patient has no interest in surgery at this time or in the future. I have referred the patient to Pain Medicine for consideration of injections to assist with pain. Patient to follow-up in a PRN fashion in the future. Interventions: Referral Pain MedicineFollow up: PRNConservative: advised the use of non-pharmacological pain modalities including heat, ice, massage, position change, activity modification and mental health exercises including deep breathing, relaxation and distraction.Discussion was had with the patient regarding diagnosis and differential diagnosis. Risk and benefits of all treatment options were discussed with the patient. All questioned were answered and patient verbalized understanding of plan.  Electronically signed by: Merin Borjon DNP, APRN AGNP-BC

## 2023-06-30 ENCOUNTER — Ambulatory Visit: Admit: 2023-06-30 | Payer: PRIVATE HEALTH INSURANCE | Attending: Rheumatology | Primary: Internal Medicine

## 2023-07-04 ENCOUNTER — Encounter: Admit: 2023-07-04 | Payer: PRIVATE HEALTH INSURANCE | Attending: Rheumatology | Primary: Internal Medicine

## 2023-07-10 ENCOUNTER — Inpatient Hospital Stay: Admit: 2023-07-10 | Discharge: 2023-07-10 | Payer: PRIVATE HEALTH INSURANCE | Primary: Internal Medicine

## 2023-07-10 DIAGNOSIS — M79661 Pain in right lower leg: Secondary | ICD-10-CM

## 2023-07-16 ENCOUNTER — Ambulatory Visit
Admit: 2023-07-16 | Payer: Medicare (Managed Care) | Attending: Endocrinology, Diabetes & Metabolism | Primary: Internal Medicine

## 2023-07-16 ENCOUNTER — Encounter
Admit: 2023-07-16 | Payer: PRIVATE HEALTH INSURANCE | Attending: Endocrinology, Diabetes & Metabolism | Primary: Internal Medicine

## 2023-07-16 VITALS — BP 130/80 | HR 81 | Ht 63.0 in | Wt 165.0 lb

## 2023-07-16 DIAGNOSIS — M1990 Unspecified osteoarthritis, unspecified site: Secondary | ICD-10-CM

## 2023-07-16 DIAGNOSIS — H5789 Other specified disorders of eye and adnexa: Secondary | ICD-10-CM

## 2023-07-16 DIAGNOSIS — I471 SVT (supraventricular tachycardia) (HC Code): Secondary | ICD-10-CM

## 2023-07-16 DIAGNOSIS — E213 Hyperparathyroidism, unspecified: Secondary | ICD-10-CM

## 2023-07-16 DIAGNOSIS — E079 Disorder of thyroid, unspecified: Secondary | ICD-10-CM

## 2023-07-16 DIAGNOSIS — E785 Hyperlipidemia, unspecified: Secondary | ICD-10-CM

## 2023-07-16 DIAGNOSIS — E063 Autoimmune thyroiditis: Secondary | ICD-10-CM

## 2023-07-16 DIAGNOSIS — H269 Unspecified cataract: Secondary | ICD-10-CM

## 2023-07-25 ENCOUNTER — Ambulatory Visit: Admit: 2023-07-25 | Payer: PRIVATE HEALTH INSURANCE | Attending: Adult Health | Primary: Internal Medicine

## 2023-07-25 ENCOUNTER — Encounter: Admit: 2023-07-25 | Payer: PRIVATE HEALTH INSURANCE | Attending: Adult Health | Primary: Internal Medicine

## 2023-07-25 VITALS — BP 128/80 | HR 67 | Ht 63.0 in | Wt 165.0 lb

## 2023-07-25 DIAGNOSIS — M7918 Myalgia, other site: Principal | ICD-10-CM

## 2023-07-25 DIAGNOSIS — M47812 Spondylosis without myelopathy or radiculopathy, cervical region: Secondary | ICD-10-CM

## 2023-07-25 DIAGNOSIS — I471 SVT (supraventricular tachycardia) (HC Code): Secondary | ICD-10-CM

## 2023-07-25 DIAGNOSIS — T7840XA Allergy, unspecified, initial encounter: Secondary | ICD-10-CM

## 2023-07-25 DIAGNOSIS — H5789 Other specified disorders of eye and adnexa: Secondary | ICD-10-CM

## 2023-07-25 DIAGNOSIS — M1990 Unspecified osteoarthritis, unspecified site: Secondary | ICD-10-CM

## 2023-07-25 DIAGNOSIS — H269 Unspecified cataract: Secondary | ICD-10-CM

## 2023-07-25 DIAGNOSIS — E063 Autoimmune thyroiditis: Secondary | ICD-10-CM

## 2023-07-25 DIAGNOSIS — E079 Disorder of thyroid, unspecified: Secondary | ICD-10-CM

## 2023-07-25 DIAGNOSIS — I10 Essential (primary) hypertension: Secondary | ICD-10-CM

## 2023-07-25 DIAGNOSIS — E785 Hyperlipidemia, unspecified: Secondary | ICD-10-CM

## 2023-07-25 DIAGNOSIS — K219 Gastro-esophageal reflux disease without esophagitis: Secondary | ICD-10-CM

## 2023-07-25 NOTE — Other
 PAIN MEDICINE CONSULT NOTEDATE OF SERVICE: 4/4/2025ATTENDING PHYSICIAN: Dr. Anders Simmonds REFERRING PHYSICIAN: Vista Deck, APRN PRIMARY PHYSICIAN: Dr. Paralee Cancel [facility]: Chief Complaint Patient presents with  New Patient   Pain: none HPI:Sara Wilson is a 73 y.o. female PMH HTN, CTS of right wrist, hearing loss, hypothyroidism, GERD, PSVT, HLD, and osteopenia referred for neck pain The patient presents with neck and shoulder pain.She experiences significant neck and shoulder pain, accompanied by tightness, which can be severe enough to cause headaches and a sensation of being off balance. The pain is described as a flare-up of a chronic issue, with the current episode persisting for an unspecified duration.For pain management, she occasionally uses Meloxicam. She employs non-pharmacological treatments such as heat, cold therapy, chiropractic care, and acupuncture sessions, which she finds beneficial.During the review of symptoms, she reports tenderness in the middle of the spine and significant right shoulder pain, especially when lifting her arm, described as a sharp pain. She experiences tightness when turning her neck and in the back when returning to a neutral position. No pain in the front of the neck.   Sara Wilson rates the pain as the following:Worst pain 8 Average pain 5 Best pain 4 Pain level that allows daily activities 9 Location Neck  Radiation Non radiating Quality sharp Frequency persistent Aggravating Factors sitting  , standing  , lying down  , twisting, lifting, bending over, exercise, sitting to standing, and climbing Relieving Factors massage, touch, heating pad, and ice pack Past Medical History: Past Surgical History: Past Medical History: Diagnosis Date  Allergy   Cataracts, bilateral   Disease of thyroid gland   Hashimoto's  GERD (gastroesophageal reflux disease)   Hashimoto's disease Hyperlipidemia   Hypertension   Osteoarthritis 2010  approximate date  SVT (supraventricular tachycardia) (HC Code)   Thyroid eye disease 2019  Past Surgical History: Procedure Laterality Date  BREAST SURGERY    cysts  CARDIAC ELECTROPHYSIOLOGY MAPPING AND ABLATION    CARPAL TUNNEL RELEASE Right 12/21/2012  CATARACT EXTRACTION W/  INTRAOCULAR LENS IMPLANT Bilateral 2022  CESAREAN SECTION    COSMETIC SURGERY  09/23  Eyes for TED  EYE SURGERY  09/23  Bilateral Lower Lid Surgery  lower lid lift  2022  Family History: Social History: Family History Problem Relation Age of Onset  Heart failure Mother   Stroke Mother 79  Hypertension Mother   Arthritis Mother   Asthma Mother   Hearing loss Mother   Heart disease Mother       Mitral Valve  Transient ischemic attack Father   Colon cancer Father       Polyps, Temp Ostomy  COPD Father   High cholesterol Father   Stroke Father       TIA  Sarcoidosis Sister   Metabolic syndrome Brother   Hypertension Brother   Asthma Maternal Grandfather   Depression Daughter   Learning disabilities Daughter   Mental health disorder Daughter   Vision loss Paternal Uncle   Social History Socioeconomic History  Marital status: Widowed Tobacco Use  Smoking status: Never  Smokeless tobacco: Never Vaping Use  Vaping status: Never Used Substance and Sexual Activity  Alcohol use: Never  Drug use: Never  Sexual activity: Not Currently   Partners: Male   Birth control/protection: Abstinence Social Drivers of Health  Received from [employer], [facility]  Social Network  Received from [employer], [facility]  HITS  Current Medications: Current Outpatient Medications:   carboxymethylcellulose (REFRESH PLUS) 0.5 % ophthalmic solution in dropperette,  Apply 1 drop to eye., Disp: , Rfl:   EPINEPHrine (EPI-PEN) 0.3 mg/0.3 mL auto-injector, Inject 0.3 mg into the muscle once as needed., Disp: , Rfl:   erythromycin (ROMYCIN) 5 mg/gram (0.5 %) ophthalmic ointment, APPLY SMALL AMOUNT TO SURGICAL SITE THREE TIMES PER DAY, Disp: , Rfl:   lactobacillus rhamnosus, GG, (CULTURELLE) 10 billion cell capsule, Take 1 capsule by mouth daily., Disp: , Rfl:   levothyroxine (SYNTHROID, LEVOTHROID) 75 MCG tablet, Take 1 tablet (75 mcg total) by mouth daily., Disp: , Rfl:   losartan (COZAAR) 25 mg tablet, Take 1 tablet (25 mg total) by mouth., Disp: , Rfl:   metoprolol succinate XL (TOPROL-XL) 50 mg 24 hr tablet, Take 1 tablet (50 mg total) by mouth daily., Disp: , Rfl:   NEXLIZET 180-10 mg Tab, , Disp: , Rfl:   polyethylene glycol (MIRALAX) 17 gram packet, Take 1 packet (17 g total) by mouth daily., Disp: , Rfl:   pravastatin (PRAVACHOL) 40 mg tablet, , Disp: , Rfl:   promethazine-dextromethorphan (PROMETHAZINE-DM) 6.25-15 mg/5 mL syrup, Take 5 mLs by mouth every 4 (four) hours as needed for cough., Disp: 150 mL, Rfl: 0  tretinoin (RETIN-A) 0.05 % cream, APPLY TO AFFECTED AREA EVERY DAY AT BEDTIME, Disp: , Rfl:   VEVYE 0.1 % Drop, PLACE 1 DROP IN EACH EYE TWICE DAILY AS DIRECTED, Disp: , Rfl:  INTERVENTIONS:1. Injections    Acupuncture 2. Alternative and Physical Therapy    Chiropractor 3. Medications      Meloxicam 4. Surgery - n/a Sara Wilson describes her symptoms of pain as follows:  OPIOID RISK TOOL Female Family History of Substance Abuse       Alcohol       Illegal Drugs       Prescription Drugs  Personal History of Substance Abuse       Alcohol       Illegal Drugs       Prescription Drugs  Age Between 16-45 years  History of Preadolescent Sexual Abuse  Psychological Disease        ADHD, OCD, bipolar, schizophrenia       Depression  TOTAL 0 Low Risk: <=3She reports that in general she :does not have difficulty making and keeping friendsis not a lonerdoes not trust other peopledoes not normally get angry easilyis not normally an impulsive sort of persondoes not emotionally depend on othersis not a perfectionistis not normally a worrier and reports the pain results in frustrated and irritable.Obstructive Sleep ApneaHas the patient been diagnosed with OSA or treated for sleep disorder?: NoIf Prescribed, is the patients CPAP/BiPAP available?: NoAre the results of a sleep study available?: NoDo you snore loudly?: NoDo you often feel tired , fatigued, or sleepy during the daytime?: NoHas anyone observed you stop breathing during sleep?: NoDo you have or are you being treated for high blood pressure?: NoIncreased Body Mass Index of >40kg/m: No Activities of Daily LivingUse the bathroom: yesEat a meal: yesDress yourself: yesBrush your teeth: yesGroom yourself: yesWalk > 1 block: yesUse the telephone: yesShop for all needs: yesPrepare meals: yesDo all housekeeping: yesDrive a car: yesTake your meds correctly: yesHandle your finances: yesReview of SystemsSee addendum.Review of Systems: I have reviewed the review of systems with the patient, as noted by the clinical support staff.  This complete review of symptoms helps to identify any additional risk in surgical consideration.  Laboratory Studies:CBC: Coags: WBC Date Value Ref Range Status 03/24/2023 5.5 4.0 - 11.0 x1000/?L Final Hematocrit Date/Time Value Ref Range Status 03/24/2023  1412 38.60 35.00 - 45.00 % Final Platelets Date/Time Value Ref Range Status 03/24/2023 1412 188 150 - 420 x1000/?L Final  No results found for: INR, PTT BUN/Cr: GFR: BUN Date/Time Value Ref Range Status 03/24/2023 1412 21 8 - 23 mg/dL Final BUN/Creatinine Ratio Date/Time Value Ref Range Status 03/24/2023 1412 25.0 (H) 8.0 - 23.0 Final Creatinine Date/Time Value Ref Range Status 03/24/2023 1412 0.84 0.40 - 1.30 mg/dL Final eGFR (Creatinine) Date/Time Value Ref Range Status 03/24/2023 1412 >60 >=60 mL/min/1.37m2 Final   Comment:   [facility] utilizes CKD-EPI Creatinine 2021 to report eGFR. Values < 60 mL/min/1.73 m2 may indicate CKD if present for more than three months AND creatinine is at steady state. The eGFR provides a rough estimate of kidney function. For further guidance, please refer to the CKD: Adult Ambulatory Care Signature pathway.  LFTs: Inflammation: Alanine Aminotransferase (ALT) Date Value Ref Range Status 03/24/2023 20 10 - 35 U/L Final   Comment:   Calcium dobesilate can cause artificially low ALT results at therapeutic concentrations Aspartate Aminotransferase (AST) Date Value Ref Range Status 03/24/2023 24 10 - 35 U/L Final Total Bilirubin Date Value Ref Range Status 03/24/2023 0.4 <=1.2 mg/dL Final Alkaline Phosphatase Date Value Ref Range Status 03/24/2023 85 9 - 122 U/L Final  Sedimentation Rate (ESR) Date Value Ref Range Status 03/24/2023 <1 0 - 20 mm/hr Final CRP, High Sensitivity Date/Time Value Ref Range Status 03/24/2023 1412 0.2 See comment mg/L Final   Comment:   hs-CRP Risk According to AHA/CDC Guidelines (mg/L): <1.0 Lower relative cardiovascular risk. 1.0-3.0 Average relative cardiovascular risk. 3.1-10.0 Higher relative cardiovascular risk. Consider retesting in 1 to 2 weeks to exclude a benign transient elevation in the baseline CRP value secondary to infection or inflammation. >10.0 Persistent elevation, upon retesting, may be associated with infection and inflammation.  Physical ExamPulmonary:    Effort: Pulmonary effort is normal. Skin:   General: Skin is warm and dry. Neurological:    Mental Status: She is alert and oriented to person, place, and time. Vitals:  07/25/23 1103 BP: 128/80 Pulse: 67      BACK:Cervical Spine: There is Mild pain in the bilateral cervical spine to palpation There is Mild pain to palpation over the bilateral trapezius muscle(s) Neck extension/flexion  is not restricted . There is Mild pain noted when neck is in extension.Negative spurlings in the bilateral cervical neck.NEUROLOGICAL Mental Status: Awake, alert, and oriented.  Interactive with examiner.  Cognitive processing appropriate for age.[place] Strength: Deltoids Biceps Triceps Hand Intrinsics Right 5/5 5/5 5/5 5/5 Left 5/5 5/5 5/5 5/5 Gait      Normal gait      Patient does not have assistive device (none)      Diagnosis1. Myofascial pain  Inject Trigger Point Sing/Multi 3+Muscle [phone])  Imaging notable for:MRI SCAN Cervical Spine (06/17/23):FINDINGS: The visualized posterior fossa is within normal limits. The spinal cord is normal in caliber. There is no abnormal cord signal. There is normal cervical alignment. The vertebral body heights are maintained. The intervertebral disc spaces are dessicated with height loss. At C2-C3, no central canal stenosis or left neuroforaminal narrowing. Right uncovertebral joint osteophyte causes mild right neuroforaminal narrowing. At C3-C4, central disc protrusion causes mild central canal stenosis. Bilateral facet arthritis and uncovertebral joint osteophytes causes moderate right and severe left neuroforaminal narrowing. At C4-C5, central disc protrusion causes mild central canal stenosis. Bilateral facet arthritis causes mild to moderate right and mild left neuroforaminal narrowing. At C5-C6, posterior disc osteophyte complex causes mild central canal stenosis  there is bilateral uncovertebral joint osteophytes and facet arthritis causing severe bilateral neuroforaminal narrowing. At C6-C7, posterior disc osteophyte complex causes mild central canal stenosis. Bilateral facet arthritis and uncovertebral joint osteophytes causes moderate bilateral neuroforaminal narrowing. At C7-T1, mild left neuroforaminal narrowing secondary to facet arthritis without central canal stenosis or right neuroforaminal narrowing.. The paravertebral soft tissues are unremarkable Assessment:72 y.o. female  with PMH significant for  HTN, CTS of right wrist, hearing loss, hypothyroidism, GERD, PSVT, HLD, and osteopenia who presents with neck painPlan:1. Interventions:- Would recommend a trial of cervical trigger point injections  to treat this patient's chronic pain complaint.2. Medications:- Long term opioid therapy is not ideal for chronic non-malignant pain due to concerns with tolerance, side effects, dependence, addiction, overdose, and hyperalgesia.- Reviewed the CTPMP today 3. Conservative Measures:- Physical therapy: continue home exercises - Could try alternative therapies such as massage and acupuncture (may be an option in the future)- There is  potential benefit of low impact aerobic exercise such as swimming, recumbant bike, elliptical machine.- Pt would benefit from working on developing positive psychological coping mechanisms to deal with chronic pain exacerbations such as meditation, biofeedback, or deep breathing exercises and avoiding maladaptive behavioral patterns.  4. Follow-up:  for procedure I provided a concise overview of the ambient note generation solution. Chasitie Sara Wilson or their legally authorized representative verbally consented to a temporary audio recording of their visit to assist with completing the visit documentation using an AI-powered solution. This note was reviewed for accuracy by Bebe Shaggy, APRN who performed the clinical service. Electronically Signed by Bebe Shaggy, APRN, April 4, 2025I provided a concise overview of the ambient note generation solution. Sara Wilson or their legally authorized representative verbally consented to a temporary audio recording of their visit to assist with completing the visit documentation using an AI-powered solution. This note was reviewed for accuracy by Bebe Shaggy, APRN who performed the clinical service.

## 2023-07-25 NOTE — Progress Notes
 Review of Systems Constitutional:  Positive for diaphoresis and fatigue. HENT:  Positive for congestion.  Eyes:  Positive for redness. Gastrointestinal:  Positive for abdominal pain and constipation. Musculoskeletal:  Positive for arthralgias and myalgias. Negative for back pain, neck pain and neck stiffness. Neurological:  Positive for headaches. Negative for weakness and numbness. Psychiatric/Behavioral:  Positive for sleep disturbance.  All other systems reviewed and are negative.

## 2023-07-30 ENCOUNTER — Encounter
Admit: 2023-07-30 | Payer: PRIVATE HEALTH INSURANCE | Attending: Student in an Organized Health Care Education/Training Program | Primary: Internal Medicine

## 2023-07-30 ENCOUNTER — Encounter: Admit: 2023-07-30 | Payer: PRIVATE HEALTH INSURANCE | Attending: Orthopedic Surgery | Primary: Internal Medicine

## 2023-07-30 DIAGNOSIS — M79604 Pain in right leg: Secondary | ICD-10-CM

## 2023-08-01 ENCOUNTER — Telehealth: Admit: 2023-08-01 | Payer: PRIVATE HEALTH INSURANCE | Attending: Orthopedic Surgery | Primary: Internal Medicine

## 2023-08-01 NOTE — Telephone Encounter
 Patient states that pain started behind the knee about two week ago. Sharp pain in the front of the knee.And pain radiating down the leg. She noticed intermittent popping noise when ambulating. Denies swelling, warmth or redness. Had zilretta injection about 2 months ago. She mentions another provider ordered an ultrasound recently which was negative for DVT. Takes tylenol. Alternating ice and heat. Is elevating and resting. She needs 4/18 reschedule to another provider and wants to be seen sooner. Advised patient to continue conservative treatments. She asked if she can go to [facility] and questioned if she would be turned away because she is an established patient. Recommended patient try a different ortho urgent care.

## 2023-08-01 NOTE — Telephone Encounter
 Copied from CRM #[id number]. Topic: Appointment Scheduling>> Aug 01, 2023  4:06 PM Oriel Ojo S wrote:Hello, this pt called to inform you that she is in great pain in her right knee behind the knee, right outer aspect of knee with some radiation to lower thigh and down into the right outer calf. She is requesting a call back from a RN and a sooner apt. She states that she does not want to book with Amy. She spoke with a supervisor in the past detailing her reasoning behind not booking with [facility]. I informed her of the next available for Zana Hesselbach and DR. Rubin. She states that these apts are too far in date and she needs something soon. Please advise on scheduling?This pt can be reached at [phone]. Thank you

## 2023-08-03 NOTE — Telephone Encounter
 Copied from CRM #[id number]. Topic: Scheduling - Schedule Appointment>> Aug 03, 2023 11:29 AM Dallis Dues E wrote:Pt has been scheduled for an appt on 4/22 at 4 pm w/Meghan GwaraThank youPod:Orthopaedics

## 2023-08-04 ENCOUNTER — Telehealth: Admit: 2023-08-04 | Payer: PRIVATE HEALTH INSURANCE | Primary: Internal Medicine

## 2023-08-04 NOTE — Telephone Encounter
 Antibiotics, infection or illness within past two weeks? YesVaccines within past two weeks including flu or COVID or COVID Booster? No	Completed COVID vaccine? No Date of completion: na		Patient should bring vaccination card to appointment if not performed within [facility]. If patient is not vaccinated or partially vaccinated, please make sure a COVID test order is place by a provider.	Notify patient no vaccines should be received between pre-procedure call and procedure date: confirmedSurgery or major dental work within past six weeks? NoPast reaction/allergy to contrast dye? No	Reaction:	Requires pre-treatment per protocol: No	* Severe Reaction to contrast media (ie shellfish, topical iodine, peanuts) may require pre medication. Contact providerTaking blood thinners (ask about Plavix, Aspirin or any anticoagulant)? Yes 	Drug:	Anticipated last dose:	Prescribing MD:	Date Cardiac Letter Sent:	Cervical (except TPI), interlaminar or caudal ESI need to hold ASA x 7 days 	and NSAIDS x 24 hours.Cardiac defibrillator or pacemaker? No If yes, cannot perform [facility] at [facility]. Do you have an allergy to chlorhexidine? No Any chance of pregnancy? NoTransportation to/from appointment? Yes	All procedures (except bedside procedures) require transportation.	If patient does not have transportation and requires it, need to reschedule.Patient to contact office with any further questions or concerns prior to this appointment.Patient advised to contact our office should there be any changes to health (illness, antibiotics, etc.) prior to this procedure. Confirmed appointment date and time.

## 2023-08-04 NOTE — Telephone Encounter
 Making sure patient is still eligible for parocedure

## 2023-08-05 ENCOUNTER — Encounter
Admit: 2023-08-05 | Payer: PRIVATE HEALTH INSURANCE | Attending: Vascular and Interventional Radiology | Primary: Internal Medicine

## 2023-08-07 ENCOUNTER — Telehealth: Admit: 2023-08-07 | Payer: PRIVATE HEALTH INSURANCE | Primary: Internal Medicine

## 2023-08-07 NOTE — Telephone Encounter
 Called patient and left message that patient can still proceed with injection

## 2023-08-08 ENCOUNTER — Encounter: Admit: 2023-08-08 | Payer: PRIVATE HEALTH INSURANCE | Attending: Orthopedic | Primary: Internal Medicine

## 2023-08-08 DIAGNOSIS — M25561 Pain in right knee: Secondary | ICD-10-CM

## 2023-08-11 ENCOUNTER — Ambulatory Visit: Admit: 2023-08-11 | Payer: PRIVATE HEALTH INSURANCE | Attending: Anesthesiology | Primary: Internal Medicine

## 2023-08-11 ENCOUNTER — Encounter: Admit: 2023-08-11 | Payer: PRIVATE HEALTH INSURANCE | Attending: Anesthesiology | Primary: Internal Medicine

## 2023-08-11 VITALS — BP 120/75 | HR 71 | Ht 63.0 in | Wt 168.0 lb

## 2023-08-11 DIAGNOSIS — M7918 Myalgia, other site: Secondary | ICD-10-CM

## 2023-08-11 DIAGNOSIS — E063 Autoimmune thyroiditis: Secondary | ICD-10-CM

## 2023-08-11 DIAGNOSIS — I10 Essential (primary) hypertension: Secondary | ICD-10-CM

## 2023-08-11 DIAGNOSIS — T7840XA Allergy, unspecified, initial encounter: Secondary | ICD-10-CM

## 2023-08-11 DIAGNOSIS — E079 Disorder of thyroid, unspecified: Secondary | ICD-10-CM

## 2023-08-11 DIAGNOSIS — K219 Gastro-esophageal reflux disease without esophagitis: Secondary | ICD-10-CM

## 2023-08-11 DIAGNOSIS — H5789 Other specified disorders of eye and adnexa: Secondary | ICD-10-CM

## 2023-08-11 DIAGNOSIS — M1990 Unspecified osteoarthritis, unspecified site: Secondary | ICD-10-CM

## 2023-08-11 DIAGNOSIS — E785 Hyperlipidemia, unspecified: Secondary | ICD-10-CM

## 2023-08-11 DIAGNOSIS — H269 Unspecified cataract: Secondary | ICD-10-CM

## 2023-08-11 DIAGNOSIS — I471 SVT (supraventricular tachycardia) (HC Code): Secondary | ICD-10-CM

## 2023-08-11 MED ORDER — BUPIVACAINE (PF) 0.25 % (2.5 MG/ML) INJECTION SOLUTION
0.25 | Freq: Once | INTRAMUSCULAR | Status: CP
Start: 2023-08-11 — End: ?
  Administered 2023-08-11: 11:00:00 0.25 mL via INTRAMUSCULAR

## 2023-08-11 MED ORDER — RIFAXIMIN 200 MG TABLET
200 | Freq: Three times a day (TID) | ORAL | Status: AC
Start: 2023-08-11 — End: ?

## 2023-08-11 NOTE — Patient Instructions
 Procedure Discharge InstructionsYou had  Trigger point injections.Do not do any strenuous activity today.You may resume your normal diet.You may return to work on: TodayDo not shower or use any warm-hot water over the area covered by the procedure for 8-12 hours. No bath or hot tubs for 48 hours after the procedure.On occasion you can experience an increase in pain for up to 72 hours following the procedure. We recommend that you put ice on the area for the first 24 hours if needed ( 10-15 minutes at a time, 3 times a day, morning, afternoon, evening). After 24 hours you may use moist heat or ice if needed. Call the [facility] physiatry team if you have any additional questions or concerns. Please monitor the site for bleeding, signs of infections, increased swelling.Please report any new or increasing numbness in your arms or legs.

## 2023-08-11 NOTE — Procedures
 ICD-10-CM  1. Myofascial pain  M79.18   PRE-PROCEDURE CHECKLIST:Pre-Procedure Pain score: 4Location of Pain: Neck, Shoulder (bilateral)Antibiotics (within last two weeks): Yes (for bacteria in colon per pt)Recent Illness (including viral or bacterial infections): NoBleeding Disorders: NoBlood Thinners: NoSurgery Within the Last 6 Weeks: NoVaccines Within 2 Weeks: NoneEaten today: YesIllegal Drugs: NoPregnant: NoDiabetes: NoMEDICATION REVIEW:Current Outpatient Medications:   carboxymethylcellulose, Apply 1 drop to eye.  EPINEPHrine , 0.3 mg, Intramuscular, Once PRN  erythromycin, APPLY SMALL AMOUNT TO SURGICAL SITE THREE TIMES PER DAY  lactobacillus rhamnosus (GG), 1 capsule, Oral, Daily  levothyroxine , 75 mcg, Oral, Daily  losartan, Take 1 tablet (25 mg total) by mouth.  metoprolol  succinate XL, 50 mg, Oral, Daily  Nexlizet ,   polyethylene glycol, 17 g, Oral, Daily  pravastatin ,   promethazine-dextromethorphan, 5 mL, Oral, Q4H PRN  tretinoin , APPLY TO AFFECTED AREA EVERY DAY AT BEDTIME  Vevye, PLACE 1 DROP IN EACH EYE TWICE DAILY AS DIRECTED  rifAXIMin , 550 mg, Oral, TIDCurrent Facility-Administered Medications:   bupivacaine  PF 0.25%ALLERGIESDiphenhydramine, Lidocaine, Alirocumab, Epinephrine , Evolocumab, Lidocaine-epinephrine , Lidocaine-epinephrine  (pf), Lipitor [atorvastatin], Synvisc [hylan g-f 20], Benadryl allergy decongestant, and Venom-honey beeOf note, patient is not allergic to local anesthetic only to the epinephrine  that causes her palpitations.  She recently had a trigger finger injection with steroid at an orthopedist office (outside hospital) with no issues.PHYSICAL EXAM:Vitals:  08/11/23 1047 BP: 120/75 Pulse: 71 PREOPERATIVE DIAGNOSIS: Myofascial painPOSTOPERATIVE DIAGNOSIS: SameANESTHESIA: LocalINDICATIONS: Patient with a history of bilateral myofascial pain in the cervical paraspinal, trapezius, rhomboid muscle distributionPROCEDURE: bilateral Trigger Point Injection in the cervical paraspinal, trapezius, rhomboid Muscle Group(s)Written informed consent was obtained after the risks and benefits of the procedure were discussed with the patient and all questions were answered. Pre-procedure blood pressure and heart rate were measured and recorded.    The patient was placed in the sitting position. Trigger points were identified and marked during the physical exam. These injection sites were prepped with chlorhexidine. Using sterile technique, a 27 gauge 1.5 inch needle was introduced into each trigger point (10 total) with reproduction of the patient's pain. Muscles injected included the bilateral cervical paraspinal, trapezius, rhomboid muscles. A total of 10 ml 0.25% bupivacaine  was injected in a fan shaped fashion, divided equally into each of the trigger points. Aspirations were negative for blood, CSF and air prior to injection at all sites. The needle was removed, skin cleansed and a sterile bandage was applied where needed.  The patient tolerated the procedure well and no complications were encountered. Following the procedure the patient's vital signs were stable. The patient was discharged home in good condition with post-procedural instructions.Post-procedure Pain Score:  BetterPlan: - Return to clinic 4 weeks.

## 2023-08-11 NOTE — Progress Notes
 Review of Systems Musculoskeletal:  Positive for neck pain (radiating to bilateral shoulders). Reviewed post-procedure education with patient. Also provided written education on AVS. Patient verbalized understanding and will contact our office with any further questions.

## 2023-08-12 ENCOUNTER — Encounter: Admit: 2023-08-12 | Payer: PRIVATE HEALTH INSURANCE | Attending: Emergency Medicine | Primary: Internal Medicine

## 2023-08-18 ENCOUNTER — Other Ambulatory Visit: Payer: Self-pay

## 2023-08-18 DIAGNOSIS — R011 Cardiac murmur, unspecified: Secondary | ICD-10-CM

## 2023-08-18 DIAGNOSIS — I10 Essential (primary) hypertension: Secondary | ICD-10-CM

## 2023-08-18 MED ORDER — LOSARTAN POTASSIUM 25 MG PO TABS: 25.0000 mg | ORAL_TABLET | Freq: Every day | ORAL | 0 refills | Status: DC

## 2023-08-26 ENCOUNTER — Encounter
Admit: 2023-08-26 | Payer: PRIVATE HEALTH INSURANCE | Attending: Emergency Medicine | Primary: Student in an Organized Health Care Education/Training Program

## 2023-08-26 DIAGNOSIS — M1711 Unilateral primary osteoarthritis, right knee: Secondary | ICD-10-CM

## 2023-08-26 MED ORDER — DICLOFENAC 1 % TOPICAL GEL
1 | Freq: Four times a day (QID) | TOPICAL | 3 refills | 30.00000 days | Status: AC
Start: 2023-08-26 — End: ?

## 2023-08-26 NOTE — Progress Notes
 ORTHOPAEDICS & REHABILITATION Division of Adult ReconstructionHip & Knee ArthritisTotal Joint Replacement & Revision [email or web address] Name:	Sara Kapner SalazarMR Number:	[id number] Date of Birth:	07-09-1952Date of Visit:	08/26/2023  Sara Wilson is currently 73 y.o.She was last seen on 05/08/23 by Dr. Kendra Pavy   Review of Systems:I have reviewed the ROS, recorded by  the medical assistant at today's visit  Interval History: Sara Wilson presents today for follow up of her right knee. At her last visit, she received a right knee Zirletta injection. She reports that this provided relief for approximately 4 months, but has started to wear off over the last few weeks.  Her pain is localized to the lateral aspect of the right knee and radiates down anterior lower leg. She also notes some pain in the calf.  Of note, a different provider had ordered an ultrasound of the lower extremity on 06/2023 which demonstrated a nonocclusive superficial thrombus in the lesser saphenous vein. She states that she had a follow up ultrasound (showed no evidence of DVT, + Baker's cyst) and has been referred to vascular for ongoing management of this.  No reported lower extremity swelling, shortness of breath, chest pain, or other concerning symptoms. No new traumas/falls/injuries, instability, numbness/tingling, swelling, rashes, erythema or bruising, overlying skin changes to the injection site, fevers or chills, or other concerns reported today. Patient Examined With:Unaccompanied Physical Exam: Ambulating with a steady gait with alternating stride, without assistive device. Skin intact over right knee without abrasions, lesions, or erythema. No effusions noted. Calf NTTP.  No edema noted. SILT and motor function intact distally.  Right knee ROM is from 0 to 120 of flexion with slight valgus alignment. No significant laxity noted with Lachman's, valgus or varus stress at 0 and 30 degrees. + lateral joint line tenderness to palpation. Imaging:  XRs reviewed from 02/13/2023 demonstrating mild-to-moderate tricompartmental osteoarthritis of bilateral knees, R>L, predominantly in the lateral tibiofemoral compartment of the right knee correlating with the patient's symptoms.Formal Radiology Reading/images in system for reviewUS Venous duplex leg-Right (DVT)Order: [id number] Impression1.  No deep venous thrombosis in the right lower extremity from the common femoral vein to the popliteal vein.2.  Thrombus within the lesser saphenous vein3.  Baker's cystA Incidental finding has been communicated to Radiology Support Staff for provider notification via the Nuance: Power Connect Actionable Findings Application on 08/20/2023 1:30 PM, Message ID [id number].NarrativeCLINICAL INFORMATION: Superficial vein thrombosisCOMPARISON: Report of previous study dated 07/10/2023 which showed thrombus within the small saphenous veinTECHNIQUE: 2D Grayscale ultrasound with compression and Doppler ultrasound with Doppler spectral analysis and color flowof the right lower extremity deep venous system was performed.FINDINGS:Common femoral vein:  Patent.Femoral vein:  Patent.Deep femoral vein:  Patent.Popliteal vein:  Patent.Deep calf veins: Patent where visualized.Greater saphenous vein:  Patent.Additional findings:  Baker's cyst is seen measuring 5 x 1.9 x 0.5 cm.In the posterior calf there is a superficial venous thrombosis. Thrombus measures about 3.8 cm in length. This is likely within the lesser saphenous veinExam End: 08/20/23  1:15 PM  Specimen Collected: 08/20/23  1:28 PM Last Resulted: 08/20/23  1:30 PM Received From: [facility]  Result Received: 08/20/23  3:06 PM Study: XR KNEE BILATERAL AP LATERAL AXIAL AND TUNNEL. Indication: pain Prior: No previous studies are available for comparison Findings/impression:No acute fracture or dislocation.Mild to moderate degenerative changes involving bilateral knee joints, right more than left.Trace joint effusions. Reported and signed by: Sullivan Endow, MD  [facility] Radiology and Biomedical ImagingDiagnosis:   ICD-10-CM  1. Primary osteoarthritis of right knee  M17.11 diclofenac (VOLTAREN) 1 % gel  Assessment: This is a 73 y.o. female presenting for follow up of chronic right knee pain due to tricompartmental osteoarthritis.  We discussed various conservative treatment options.  The patient prefers to avoid any surgery for as long as possible.  We discussed having her return 6 months after her prior Zirletta injection for another CSI.  She reports that she prefers to have a Kenalog  injection at her next visit instead of Zirletta. Of note, patient has documented allergy to lidocaine with epinephrine .  She states that she has tolerated lidocaine with prior injections without adverse effect. She was also encouraged to follow up with vascular surgery per her PCP for known superficial thrombus in the right lower extremity. Further Workup / Advanced Imaging Plan: None at this time Treatment / Intervention Plan:- are his pharmacy for topical Voltaren gel.  Okay to use up to 4 times per day as neededPatient advised to continue to utilize conservative treatment modalities as previously discussed:- Modify activities below threshold of discomfort & avoid particular triggers of pain when possible. -Choose non impact exercises like stationary bike, water exercising, and chair based routines.-During flare-ups utilize resting, elevation, cryotherapy/heating pad, and compression if helpful.-Utilize cane or walker to unload painful joints and for better stability/safety when needed.-Proper body weight, leg strength maintenance, and supportive shoes for standing/ambulating.-OTC meds like Tylenol for discomfort relief and NSAIDS for inflammation (if able).-Topical preparations such as Diclofenac gel and Lidocaine patches and creams as helpful.Follow-Up Plan:Follow up after July 16th for right knee CSI and clinical reassessmentPatient is advised to call with any questions/concernsMeghan Shigeo Baugh PA-[facility] [id number] [facility], [phone]  	[phone] 		Main Office & Appointments	[phone]		Fax Line	[phone])	Assistant Direct LineModal DictationThis chart was created using M-Modal dictation software, and I have personally reviewed the dictation for accuracy. However, errors may still be present when words are incorrectly transcribed due to limitations of the software. Billing & Compliance: On the day of this patient's encounter, a total of 20 minutes was personally spent by me.  Greater than 50% of this visit was spent in face to face evaluation and examination. This time included preparation with record and imaging review, obtaining and reviewing patient history, performing a medically appropriate examination, counseling the patient, family, and/or caregiver, ordering medications, tests, or procedures, referring to and communicating with other healthcare professionals, and documenting all of the above clinical information into the medical record. This does not include any time spent performing a procedural service. cc:  Patient Care Team:Aloi, Fletcher Humble, MD as PCP - General (Endocrinology, Diabetes & Metabolism)

## 2023-08-26 NOTE — Progress Notes
 Review of Systems Constitutional: Negative.  HENT: Negative.   Eyes: Negative.  Respiratory: Negative.   Cardiovascular: Negative.  Gastrointestinal: Negative.  Genitourinary: Negative.  Musculoskeletal:       Follow up right knee pain Had CSI in January 2025 with relief for a while pain is starting to return and now is radiating down to mid calf denies numbness/tingling states sharp shooting pain intermittentlyShe does RICE with some relief rarely will take an IBU prn Skin: Negative.  Endo/Heme/Allergies: Negative.

## 2023-09-19 ENCOUNTER — Encounter
Admit: 2023-09-19 | Payer: PRIVATE HEALTH INSURANCE | Attending: Adult Health | Primary: Student in an Organized Health Care Education/Training Program

## 2023-09-19 ENCOUNTER — Ambulatory Visit
Admit: 2023-09-19 | Payer: PRIVATE HEALTH INSURANCE | Attending: Adult Health | Primary: Student in an Organized Health Care Education/Training Program

## 2023-09-19 VITALS — BP 104/70 | HR 75 | Ht 63.0 in | Wt 168.0 lb

## 2023-09-19 DIAGNOSIS — I10 Essential (primary) hypertension: Secondary | ICD-10-CM

## 2023-09-19 DIAGNOSIS — E785 Hyperlipidemia, unspecified: Secondary | ICD-10-CM

## 2023-09-19 DIAGNOSIS — E079 Disorder of thyroid, unspecified: Secondary | ICD-10-CM

## 2023-09-19 DIAGNOSIS — H269 Unspecified cataract: Secondary | ICD-10-CM

## 2023-09-19 DIAGNOSIS — T7840XA Allergy, unspecified, initial encounter: Secondary | ICD-10-CM

## 2023-09-19 DIAGNOSIS — H5789 Other specified disorders of eye and adnexa: Secondary | ICD-10-CM

## 2023-09-19 DIAGNOSIS — K219 Gastro-esophageal reflux disease without esophagitis: Secondary | ICD-10-CM

## 2023-09-19 DIAGNOSIS — E063 Autoimmune thyroiditis: Secondary | ICD-10-CM

## 2023-09-19 DIAGNOSIS — M1990 Unspecified osteoarthritis, unspecified site: Secondary | ICD-10-CM

## 2023-09-19 DIAGNOSIS — I471 SVT (supraventricular tachycardia) (HC Code) (HC CODE): Secondary | ICD-10-CM

## 2023-09-19 DIAGNOSIS — M5416 Radiculopathy, lumbar region: Principal | ICD-10-CM

## 2023-09-19 NOTE — Progress Notes
 Review of Systems Musculoskeletal:  Positive for neck pain (Cervical Spine - Follow Up). All other systems reviewed and are negative.

## 2023-09-19 NOTE — Progress Notes
 [facility] Pain [facility] Date of Visit: 5/30/2025History of present illness:Ms. Sara Wilson is a 73 y.o. female PMH HTN, CTS of right wrist, hearing loss, hypothyroidism, GERD, PSVT, HLD, and osteopenia referred for neck pain  Interim history:The patient returns for follow up and s/p  bilateral Trigger Point Injection in the cervical paraspinal, trapezius, rhomboid Muscle Group(s) She presents with neck pain She experiences significant neck pain following a recent procedure involving trigger point injections. The procedure was extremely painful due to the absence of local anesthetic (per patient) involving multiple needle pricks and performed by two individuals, including one in training. Despite the procedure, there is no significant improvement in her neck pain, which she rates as 4 or 5 out of 10. She has previously engaged in multiple therapy sessions for her neck pain, but the recent procedure has not provided the expected relief.She utilizes non-pharmacological methods for pain management, including heat, ice, exercises, stretching, and topical treatments. She is not currently taking any specific medications for her neck pain.Past Medical History:She  has a past medical history of Allergy, Cataracts, bilateral, Disease of thyroid gland, GERD (gastroesophageal reflux disease), Hashimoto's disease, Hyperlipidemia, Hypertension, Osteoarthritis (2010), SVT (supraventricular tachycardia) (HC Code) (HC CODE), and Thyroid eye disease (2019). Past Surgical History: She  has a past surgical history that includes Carpal tunnel release (Right, 12/21/2012); Cardiac electrophysiology mapping and ablation; Cesarean section; Breast surgery; Cataract extraction w/  intraocular lens implant (Bilateral, 2022); lower lid lift (2022); Eye surgery (09/23); and Cosmetic surgery (09/23).Medications:Current Outpatient Medications Medication Sig Dispense Refill  diclofenac  (VOLTAREN ) 1 % gel Apply topically 4 (four) times daily. 100 g 2  EPINEPHrine  (EPI-PEN) 0.3 mg/0.3 mL auto-injector Inject 0.3 mg into the muscle once as needed.    lactobacillus rhamnosus, GG, (CULTURELLE) 10 billion cell capsule Take 1 capsule by mouth daily.    levothyroxine  (SYNTHROID , LEVOTHROID) 75 MCG tablet Take 1 tablet (75 mcg total) by mouth daily.    losartan  (COZAAR ) 25 mg tablet Take 1 tablet (25 mg total) by mouth.    metoprolol  succinate XL (TOPROL -XL) 50 mg 24 hr tablet Take 1 tablet (50 mg total) by mouth daily.    NEXLIZET  180-10 mg Tab     polyethylene glycol (MIRALAX ) 17 gram packet Take 1 packet (17 g total) by mouth daily.    pravastatin  (PRAVACHOL ) 40 mg tablet     tretinoin  (RETIN-A ) 0.05 % cream APPLY TO AFFECTED AREA EVERY DAY AT BEDTIME    VEVYE 0.1 % Drop PLACE 1 DROP IN EACH EYE TWICE DAILY AS DIRECTED    carboxymethylcellulose (REFRESH PLUS) 0.5 % ophthalmic solution in dropperette Apply 1 drop to eye.    erythromycin (ROMYCIN) 5 mg/gram (0.5 %) ophthalmic ointment APPLY SMALL AMOUNT TO SURGICAL SITE THREE TIMES PER DAY    promethazine-dextromethorphan (PROMETHAZINE-DM) 6.25-15 mg/5 mL syrup Take 5 mLs by mouth every 4 (four) hours as needed for cough. 150 mL 0  rifAXIMin  (XIFAXAN ) 200 mg tablet Take 550 mg by mouth 3 (three) times daily.   No current facility-administered medications for this visit.  Allergies: She is allergic to diphenhydramine, lidocaine, wasp venom, alirocumab, atorvastatin, epinephrine , evolocumab, lidocaine-epinephrine , lidocaine-epinephrine  (pf), synvisc [hylan g-f 20], venom-wasp, benadryl allergy decongestant, diphenhydramine hcl, and venom-honey bee.Family History: family history includes Arthritis in her mother; Asthma in her maternal grandfather and mother; COPD in her father; Colon cancer in her father; Depression in her daughter; Hearing loss in her mother; Heart disease in her mother; Heart failure in her mother; High cholesterol in her  father; Hypertension in her brother and mother; Learning disabilities in her daughter; Mental health disorder in her daughter; Metabolic syndrome in her brother; Sarcoidosis in her sister; Stroke in her father; Stroke (age of onset: 42) in her mother; Transient ischemic attack in her father; Vision loss in her paternal uncle.Social history: She  reports no history of drug use.She  reports no history of alcohol use.She  reports that she has never smoked. She has never used smokeless tobacco.ROSReview of Systems Musculoskeletal:  Positive for neck pain (Cervical Spine- Follow Up). All other systems reviewed and are negative.Physical ExamPulmonary:    Effort: Pulmonary effort is normal. Skin:   General: Skin is warm and dry. Neurological:    Mental Status: She is alert and oriented to person, place, and time.      BP 104/70  - Pulse 75  - Ht 5' 3 (1.6 m)  - Wt 76.2 kg  - BMI 29.76 kg/m? Cervical Spine: There is Moderate pain to palpation of the cervical spineThere is Moderate pain to palpation over the bilateral trapezius and cervical muscle(s) Neck extension/flexion  is not restrictedNegative pain with extension or flexion Negative spurlings in the bilateral cervical neck.Gait      Normal gait      Patient does not have assistive device (none) Assessment: 73 y.o. female PMH HTN, CTS of right wrist, hearing loss, hypothyroidism, GERD, PSVT, HLD, and osteopenia who presents with neck pain Plan: 1. Medications:-long term opioid therapy is not ideal for chronic non-malignant pain due to concerns with tolerance, side effects, dependence, addiction, overdose, and hyperalgesia.2. Interventions:-08/11/23 s/p  bilateral Trigger Point Injection in the cervical paraspinal, trapezius, rhomboid Muscle Group(s) -informed will need ride for procedure and can not be done if infection, vaccine or antibiotic use in the past 2 weeks or on blood thinners 3. Conservative:-There is potential benefit for the use of heat, ice, massage, position changes, activity modification and mental health exercises including deep breathing and relaxation therapy; tai chi, yoga, core strengthening and low impact exercises Follow up: As needed Electronically Signed by Kelton Patron, APRN, May 30, 2025I provided a concise overview of the ambient note generation solution. Exie Sara [name] or their legally authorized representative verbally consented to a temporary audio recording of their visit to assist with completing the visit documentation using an AI-powered solution. This note was reviewed for accuracy by Kelton Patron, APRN who performed the clinical service.

## 2023-09-21 ENCOUNTER — Encounter
Admit: 2023-09-21 | Payer: PRIVATE HEALTH INSURANCE | Attending: Adult Health | Primary: Student in an Organized Health Care Education/Training Program

## 2023-10-10 ENCOUNTER — Encounter
Admit: 2023-10-10 | Payer: PRIVATE HEALTH INSURANCE | Attending: Cardiovascular Disease | Primary: Student in an Organized Health Care Education/Training Program

## 2023-10-10 MED ORDER — NEXLIZET 180 MG-10 MG TABLET
180-10 | ORAL_TABLET | Freq: Every day | ORAL | 4 refills | Status: AC
Start: 2023-10-10 — End: ?

## 2023-11-04 ENCOUNTER — Telehealth: Payer: Self-pay | Admitting: Obstetrics and Gynecology

## 2023-11-04 NOTE — Telephone Encounter (Signed)
 This is Dr. Nikki doing chart recall for Dr. Lavoie.  Patient is overdue for her pap recall follow up following LEEP procedure. Please schedule with provider.

## 2023-11-05 NOTE — Telephone Encounter (Signed)
 Call placed to patient, left detailed message, ok per dpr.  Advised per Dr. Nikki. Return call to office at [phone], option 4,  if any additional questions; opt 1 to schedule. Return call to advise if you have transferred care.

## 2023-11-07 MED ORDER — LEVOTHYROXINE 50 MCG TABLET
50 | ORAL_TABLET | Freq: Every day | ORAL | 3 refills | 90.00000 days | Status: AC
Start: 2023-11-07 — End: 2024-01-21

## 2023-11-12 ENCOUNTER — Encounter
Admit: 2023-11-12 | Payer: PRIVATE HEALTH INSURANCE | Attending: Adult Health | Primary: Student in an Organized Health Care Education/Training Program

## 2023-11-12 ENCOUNTER — Ambulatory Visit
Admit: 2023-11-12 | Payer: PRIVATE HEALTH INSURANCE | Attending: Adult Health | Primary: Student in an Organized Health Care Education/Training Program

## 2023-11-12 ENCOUNTER — Inpatient Hospital Stay
Admit: 2023-11-12 | Discharge: 2023-11-12 | Payer: Medicare (Managed Care) | Primary: Student in an Organized Health Care Education/Training Program

## 2023-11-12 VITALS — BP 145/77 | HR 75

## 2023-11-12 DIAGNOSIS — H269 Unspecified cataract: Secondary | ICD-10-CM

## 2023-11-12 DIAGNOSIS — I10 Essential (primary) hypertension: Secondary | ICD-10-CM

## 2023-11-12 DIAGNOSIS — M47816 Spondylosis without myelopathy or radiculopathy, lumbar region: Principal | ICD-10-CM

## 2023-11-12 DIAGNOSIS — T7840XA Allergy, unspecified, initial encounter: Secondary | ICD-10-CM

## 2023-11-12 DIAGNOSIS — I471 SVT (supraventricular tachycardia) (HC Code) (HC CODE): Secondary | ICD-10-CM

## 2023-11-12 DIAGNOSIS — E063 Autoimmune thyroiditis: Secondary | ICD-10-CM

## 2023-11-12 DIAGNOSIS — E079 Disorder of thyroid, unspecified: Principal | ICD-10-CM

## 2023-11-12 DIAGNOSIS — M1990 Unspecified osteoarthritis, unspecified site: Secondary | ICD-10-CM

## 2023-11-12 DIAGNOSIS — K219 Gastro-esophageal reflux disease without esophagitis: Secondary | ICD-10-CM

## 2023-11-12 DIAGNOSIS — H5789 Other specified disorders of eye and adnexa: Secondary | ICD-10-CM

## 2023-11-12 DIAGNOSIS — E785 Hyperlipidemia, unspecified: Secondary | ICD-10-CM

## 2023-11-12 NOTE — Progress Notes
 Review of Systems Musculoskeletal:  Positive for back pain.      6/10 lower back pain, radiates to right thigh  Neurological: Negative.

## 2023-11-12 NOTE — Progress Notes
 [facility] Phone: [phone], Fax: [phone], [address] [place], [place] [address] [place], [place] Name: Sara Wunder SalazarDate of Birth: May 15, 1952Date of Visit: 7/23/2025History of present illness:Ms. Younger Wilson is a 73 y.o. female with a PMH significant for HTN, CTS of right wrist, hearing loss, hypothyroidism, laryngopharyngeal reflux, PSVT, HLD, and osteopenia. Patient presents today for evaluation of low back pain with radiation to RLE with associated paresthesias. Last visit (06/25/2023):She reports no significant change since last evaluation. She continues to experience axial neck pain with radiation to B/L trapezius muscles. No assistive devices for ambulation. Given axial symptoms and lack of canal stenosis, I have recommended non-surgical measures. The patient has no interest in surgery at this time or in the future. I have referred the patient to Pain Medicine for consideration of injections to assist with pain. Patient to follow-up in a PRN fashion in the future. She reports intermittent, chronic low back pain for the past several years without inciting event or trauma. Pain was previously intermittent and is now constant in nature. She reports radiation to right leg, described as posterolateral thigh and lateral calf with associated tingling. Pain is described as low back < right leg, left leg unaffected. Pain is increased with twisting and lying on her right side. Pain is unrelieved with position or activity change. No assistive devices for ambulation. Interventions to date:Meds - Tylenol PRN, Motrin  PRN; some reliefPT - Completed at [facility] in [place] in winter 2025 with ongoing HEP; some reliefCSI - Cervical TPIs (08/11/2023); no reliefCervical ESI 5-6 years ago; no reliefLumbar ESI in the past; some reliefAlternative treatments - Chiropractor and acupuncture weekly for the past 3-4 months; transient reliefIce, heat; some reliefOther providers:Dr. Melodye - Ortho Joint (Knee)Dr. Scandrett - CardiologyDr. Leory - RheumatologyDr. Rajput - Pain MedicineDenies any reg flag symptoms at this time. No bladder or bowel dysfunction, no saddle anesthesia. No balance issues or falls. No fine motor dysfunction. Denies previous spine surgery. No other complaints.Past Medical History:She  has a past medical history of Allergy, Cataracts, bilateral, Disease of thyroid gland, GERD (gastroesophageal reflux disease), Hashimoto's disease, Hyperlipidemia, Hypertension, Osteoarthritis (2010), SVT (supraventricular tachycardia) (HC Code) (HC CODE), and Thyroid eye disease (2019). Past Surgical History: She  has a past surgical history that includes Carpal tunnel release (Right, 12/21/2012); Cardiac electrophysiology mapping and ablation; Cesarean section; Breast surgery; Cataract extraction w/  intraocular lens implant (Bilateral, 2022); lower lid lift (2022); Eye surgery (09/23); and Cosmetic surgery (09/23).Medications:Current Outpatient Medications Medication Sig Dispense Refill  carboxymethylcellulose (REFRESH PLUS) 0.5 % ophthalmic solution in dropperette Apply 1 drop to eye.    diclofenac  (VOLTAREN ) 1 % gel Apply topically 4 (four) times daily. 100 g 2  EPINEPHrine  (EPI-PEN) 0.3 mg/0.3 mL auto-injector Inject 0.3 mg into the muscle once as needed.    erythromycin (ROMYCIN) 5 mg/gram (0.5 %) ophthalmic ointment APPLY SMALL AMOUNT TO SURGICAL SITE THREE TIMES PER DAY    lactobacillus rhamnosus, GG, (CULTURELLE) 10 billion cell capsule Take 1 capsule by mouth daily.    levothyroxine  (SYNTHROID , LEVOTHROID) 50 MCG tablet Take 1 tablet (50 mcg total) by mouth daily. 90 tablet 2  losartan  (COZAAR ) 25 mg tablet Take 1 tablet (25 mg total) by mouth.    metoprolol  succinate XL (TOPROL -XL) 50 mg 24 hr tablet Take 1 tablet (50 mg total) by mouth daily.    NEXLIZET  180-10 mg Tab Take 1 tablet by mouth daily. 90 tablet 3  polyethylene glycol (MIRALAX ) 17 gram packet Take 1 packet (17  g total) by mouth daily.    pravastatin  (PRAVACHOL ) 40 mg tablet     promethazine-dextromethorphan (PROMETHAZINE-DM) 6.25-15 mg/5 mL syrup Take 5 mLs by mouth every 4 (four) hours as needed for cough. 150 mL 0  rifAXIMin  (XIFAXAN ) 200 mg tablet Take 550 mg by mouth 3 (three) times daily.    tretinoin  (RETIN-A ) 0.05 % cream APPLY TO AFFECTED AREA EVERY DAY AT BEDTIME    VEVYE 0.1 % Drop PLACE 1 DROP IN EACH EYE TWICE DAILY AS DIRECTED   No current facility-administered medications for this visit.  Allergies: She is allergic to diphenhydramine, lidocaine, wasp venom, alirocumab, atorvastatin, epinephrine , evolocumab, lidocaine-epinephrine , lidocaine-epinephrine  (pf), synvisc [hylan g-f 20], venom-wasp, benadryl allergy decongestant, diphenhydramine hcl, and venom-honey bee.Family History: family history includes Arthritis in her mother; Asthma in her maternal grandfather and mother; COPD in her father; Colon cancer in her father; Depression in her daughter; Hearing loss in her mother; Heart disease in her mother; Heart failure in her mother; High cholesterol in her father; Hypertension in her brother and mother; Learning disabilities in her daughter; Mental health disorder in her daughter; Metabolic syndrome in her brother; Sarcoidosis in her sister; Stroke in her father; Stroke (age of onset: 70) in her mother; Transient ischemic attack in her father; Vision loss in her paternal uncle.Social history: She  reports no history of drug use.She  reports no history of alcohol use.She  reports that she has never smoked. She has never used smokeless tobacco.Review of Systems: I have reviewed the review of systems with the patient, as noted by the clinical support staff.  This complete review of symptoms helps to identify any additional risk in surgical consideration.Physical Examination: BP (!) 145/77  - Pulse 75 General Appearance - Well developed, well nourishedMood/Affect - NormalApparent Distress - NoneSkin - Normal temperature, turgor, color, and moisture with no echhymosis, cyanosis, clubbing, or hyper/hypopigmented lesions Thoracolumbar Spine- No tenderness to palpation over spine or buttocks- TTP B/L lumbar paraspinals and lumbosacral junction- No previous incisions- ROM limited in all planes d/t pain- Pain in back with flexion and extension- No pain in legs with flexion or extension- No elevation of shoulders or pelvis ExtremitiesLower:- No pain with hip motion - Negative straight leg raise- No edema, swelling, varicosities, lymphadenopathyNeurologic -Gait:       Non-antalgic gait       Stands unassisted with appropriate sagittal and coronal balance.       Does not require assistive device for ambulationRectal Tone/Perineal Sensation Deferred d/t lack of bowel/bladder complaints    R L L2 (Hip flexors) 5 /5 5 /5 L3 (quad/Knee ext) 5 /5 5 /5 L4 (Tibialis ant) 5 /5 5 /5  L5(EHL) 5 /5 5 /5 S1 (Gastroc) 5 /5 5 /5  Sensation:   R L L2 (Medial thigh) + + L3 (Medial knee) + + L4 (Medial ankle) + + L5 (1st web space) + + S1 (Lateral heel) + +  Reflexes:  R L L4 (Patellar) + + S1 (Achilles) - -                No clonusImaging:  XR Lumbar Spine AP Lateral Flex/Ext (11/12/2023)Grade 1 anterolisthesis of L5 on S1 without evidence of dynamic instability on flexion/extension. MRI Lumbar Spine wo IV Contrast, OSH (10/29/2023)Grade 1 anterolisthesis of L5 on S1 with moderate B/L neural foraminal stenosis. MRI Cervical Spine wo IV Contrast, OSH (06/17/2023)XR Cervical Spine AP and Lateral (05/14/2023)FINDINGS: Vertebral body height appears maintained.There is a small right accessory rib at the C7. Linear  metallic densities projecting at the level of T1 and T2 only seen on the lateral view are likely overlying the patient.Straightened cervical lordosis. There is minimal anterolisthesis of C4 on C5. Variable degrees of disc space narrowing, end plate sclerosis and osteophytosis is present throughout the cervical spine, notably at C5-6. Variable degrees of uncovertebral and facet arthrosis is present  throughout the cervical spine. 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 Reported and signed by: Lawyer Ruth, MD  Assessment/Plan:  Sara Wilson is a 73 y.o. female with a PMH significant for HTN, CTS of right wrist, hearing loss, hypothyroidism, laryngopharyngeal reflux, PSVT, HLD, and osteopenia. Patient presents today for evaluation of low back pain with radiation to RLE with associated paresthesias in the setting of MRI Lumbar Spine July 2025 revealing grade 1 anterolisthesis of L5 on S1 with associated DDD and B/L moderate neural foraminal stenosis. Surgical and non-surgical options discussed with patient. The patient will follow-up with Dr. Jeannette for consideration of ESI to assist with radicular symptoms. Patient to follow-up with Dr. Frost for surgical discussion.Interventions: NoneFollow up: with Dr. [place]: advised the use of non-pharmacological pain modalities including heat, ice, massage, position change, activity modification and mental health exercises including deep breathing, relaxation and distraction.Discussion was had with the patient regarding diagnosis and differential diagnosis. Risk and benefits of all treatment options were discussed with the patient. All questioned were answered and patient verbalized understanding of plan.  Electronically signed by: Emon Miggins DNP, APRN AGNP-BC

## 2023-11-16 ENCOUNTER — Other Ambulatory Visit: Payer: Self-pay | Admitting: Nurse Practitioner

## 2023-11-16 DIAGNOSIS — R011 Cardiac murmur, unspecified: Secondary | ICD-10-CM

## 2023-11-16 DIAGNOSIS — I10 Essential (primary) hypertension: Secondary | ICD-10-CM

## 2023-11-20 ENCOUNTER — Telehealth
Admit: 2023-11-20 | Payer: PRIVATE HEALTH INSURANCE | Primary: Student in an Organized Health Care Education/Training Program

## 2023-11-20 NOTE — Telephone Encounter
 Called and LVM to come at 8am for appointment

## 2023-11-21 NOTE — Telephone Encounter (Signed)
 Ok to send letter to patient recommending follow up and then remove from recall.

## 2023-11-21 NOTE — Telephone Encounter (Signed)
 No response from patient.   Dr. Nikki -please advise.

## 2023-11-24 NOTE — Telephone Encounter (Signed)
 Letter pended, copy routed to Dr. Nikki.

## 2023-11-24 NOTE — Telephone Encounter (Signed)
 Letter printed for Dr. Nikki to review and sign. Copy sent by MyChart.   Removed from recall.   Encounter closed.

## 2023-11-25 ENCOUNTER — Telehealth
Admit: 2023-11-25 | Payer: PRIVATE HEALTH INSURANCE | Primary: Student in an Organized Health Care Education/Training Program

## 2023-11-25 NOTE — Telephone Encounter
 Called patient to ask if she could move up for the doctor who has to give a speech and patient says no that she is very busy and has to do a walk through for a new property on the next day and has a very bust day for the day she scheduled. Patient would not budge to any time of day that day.  Patient advised that message would be relayed to MD

## 2023-11-26 ENCOUNTER — Telehealth
Admit: 2023-11-26 | Payer: PRIVATE HEALTH INSURANCE | Primary: Student in an Organized Health Care Education/Training Program

## 2023-11-26 NOTE — Telephone Encounter
 Patient was called and asked to  move to earlier date and then offered the same day however patient was not flexible  or cooperative and said she will tell her PCP and just forget the appointment

## 2023-11-28 ENCOUNTER — Ambulatory Visit
Admit: 2023-11-28 | Payer: PRIVATE HEALTH INSURANCE | Attending: Anesthesiology | Primary: Student in an Organized Health Care Education/Training Program

## 2023-12-01 ENCOUNTER — Ambulatory Visit
Admit: 2023-12-01 | Payer: PRIVATE HEALTH INSURANCE | Attending: Cardiovascular Disease | Primary: Student in an Organized Health Care Education/Training Program

## 2023-12-12 ENCOUNTER — Ambulatory Visit
Admit: 2023-12-12 | Payer: PRIVATE HEALTH INSURANCE | Primary: Student in an Organized Health Care Education/Training Program

## 2023-12-17 ENCOUNTER — Encounter
Admit: 2023-12-17 | Payer: PRIVATE HEALTH INSURANCE | Attending: Orthopedic Surgery | Primary: Student in an Organized Health Care Education/Training Program

## 2023-12-24 ENCOUNTER — Encounter
Admit: 2023-12-24 | Payer: PRIVATE HEALTH INSURANCE | Primary: Student in an Organized Health Care Education/Training Program

## 2023-12-24 DIAGNOSIS — E063 Autoimmune thyroiditis: Secondary | ICD-10-CM

## 2023-12-24 DIAGNOSIS — M1990 Unspecified osteoarthritis, unspecified site: Secondary | ICD-10-CM

## 2023-12-24 DIAGNOSIS — H269 Unspecified cataract: Secondary | ICD-10-CM

## 2023-12-24 DIAGNOSIS — E079 Disorder of thyroid, unspecified: Principal | ICD-10-CM

## 2023-12-24 DIAGNOSIS — I471 SVT (supraventricular tachycardia) (HC Code) (HC CODE): Secondary | ICD-10-CM

## 2023-12-24 DIAGNOSIS — T7840XA Allergy, unspecified, initial encounter: Secondary | ICD-10-CM

## 2023-12-24 DIAGNOSIS — K219 Gastro-esophageal reflux disease without esophagitis: Secondary | ICD-10-CM

## 2023-12-24 DIAGNOSIS — H5789 Other specified disorders of eye and adnexa: Secondary | ICD-10-CM

## 2023-12-24 DIAGNOSIS — I10 Essential (primary) hypertension: Secondary | ICD-10-CM

## 2023-12-24 DIAGNOSIS — E785 Hyperlipidemia, unspecified: Secondary | ICD-10-CM

## 2023-12-31 ENCOUNTER — Encounter
Admit: 2023-12-31 | Payer: PRIVATE HEALTH INSURANCE | Attending: Orthopedic Surgery | Primary: Student in an Organized Health Care Education/Training Program

## 2023-12-31 DIAGNOSIS — M4316 Spondylolisthesis, lumbar region: Secondary | ICD-10-CM

## 2023-12-31 DIAGNOSIS — E785 Hyperlipidemia, unspecified: Secondary | ICD-10-CM

## 2023-12-31 DIAGNOSIS — I471 SVT (supraventricular tachycardia) (HC Code) (HC CODE): Secondary | ICD-10-CM

## 2023-12-31 DIAGNOSIS — H5789 Other specified disorders of eye and adnexa: Secondary | ICD-10-CM

## 2023-12-31 DIAGNOSIS — I10 Essential (primary) hypertension: Secondary | ICD-10-CM

## 2023-12-31 DIAGNOSIS — E079 Disorder of thyroid, unspecified: Principal | ICD-10-CM

## 2023-12-31 DIAGNOSIS — M1990 Unspecified osteoarthritis, unspecified site: Secondary | ICD-10-CM

## 2023-12-31 DIAGNOSIS — K219 Gastro-esophageal reflux disease without esophagitis: Secondary | ICD-10-CM

## 2023-12-31 DIAGNOSIS — M47812 Spondylosis without myelopathy or radiculopathy, cervical region: Principal | ICD-10-CM

## 2023-12-31 DIAGNOSIS — T7840XA Allergy, unspecified, initial encounter: Secondary | ICD-10-CM

## 2023-12-31 DIAGNOSIS — H269 Unspecified cataract: Secondary | ICD-10-CM

## 2023-12-31 DIAGNOSIS — E063 Autoimmune thyroiditis: Secondary | ICD-10-CM

## 2023-12-31 NOTE — Progress Notes
 [facility]OPAEDICS & REHABILITATIONSpine SurgeryPhone: [phone], Fax: [phone], [email or web address] office [address], [place], [place] [address]Jif[facility] office mailing address: [id number], [place], [place] [address]Ejupzwu name: 	 Sara Wilson MR:	 [id number] Date of birth:	 October 06, 1952Date of visit:	 9/10/2025Provider:	 Estelita Ranch, MD	History of present illness:Sara Wilson is a 73 y.o. female RHD with PMH significant for HTN, right CTR, hearing loss, hypothyroidism, PSVT, osteopenia who presents today for evaluation of chronic neck and low back pain that have been present for the past several months without acute traumatic onset. Regarding her cervical spine, she reports chronic neck pain and bilateral trapezial pain. Pain is increased with neck range of motion and prolonged sitting. Pain is improved with rest, heat, ice, and gentle stretching. Regarding her lumbar spine, she reports chronic low back pain with flare ups for the past 6 months. She denies radiation to bilateral lower extremities or associated paresthesias. Low back pain>neck pain. She rates her pain an 8/10 and is constant. Pain is worsened with bending, lifting and twisting activities as well as prolonged sitting and activities. She is able to walk 1 mile without difficulty.She is currently taking Tylenol and occasionally takes meloxicam  and ibuprofen . She also uses heat, cold, and stretching. She has history of 3 low back injections without significant relief. She previously participated in physical therapy with some relief. She has also seen a chiropractor but did not provide long term relief. She states pain is not significantly affecting her ability to perform daily activities. Other providers:Dr. Rajput - pain management Dr. Melodye - Orthopedic surgery (Knee)Dr. Scandrett - CardiologyDr. Leory - RheumatologyInterventions to date:Meds - Tylenol PRN, Motrin  PRN; some reliefPT - Currently enrolled at [facility] in [place] for the past month with ongoing HEP; some reliefCSI -  Cervical ESI 5-6 years ago; no reliefCervical TPI 3 months ago without reliefLumbar ESI x3 last in 2023 in [place]  with some reliefAlternative treatments - Chiropractor and acupuncture weekly for the past 3-4 months; transient reliefIce, heat; some reliefThe patient denies fever, chills, nausea, vomiting, bowel/bladder dysfunction.Social history:  She is retired, previously was a Psychiatric nurse. Patient denies smoking or drug use.Patient reported outcome responses and scores:  04/28/2023   3:07 PM 05/26/2022   8:28 AM Global Health Scale Score Global Physical Health Raw Score: 15 14 Global Physical Health T-Score: 39.8  37.4 Global Mental Health Raw Score:  15 14 Global Mental Health T-Score: 50.8  48.3 Calculated EQ-5D: 0.73 0.71   Patient-reported I have reviewed the review of systems, and it is listed in the clinical support section of the record.   Past medical history: She  has a past medical history of Allergy, Cataracts, bilateral, Disease of thyroid gland, GERD (gastroesophageal reflux disease), Hashimoto's disease, Hyperlipidemia, Hypertension, Osteoarthritis (2010), SVT (supraventricular tachycardia) (HC Code) (HC CODE), and Thyroid eye disease (2019).Past surgical history: She  has a past surgical history that includes Carpal tunnel release (Right, 12/21/2012); Cardiac electrophysiology mapping and ablation; Cesarean section; Breast surgery; Cataract extraction w/  intraocular lens implant (Bilateral, 2022); lower lid lift (2022); Eye surgery (09/23); and Cosmetic surgery (09/23).Family / social history: Her family history includes Arthritis in her mother; Asthma in her maternal grandfather and mother; COPD in her father; Colon cancer in her father; Depression in her daughter; Hearing loss in her mother; Heart disease in her mother; Heart failure in her mother; High cholesterol in her father; Hypertension in her brother and mother; Learning disabilities in her daughter; Mental health disorder in her daughter; Metabolic syndrome in her  brother; Sarcoidosis in her sister; Stroke in her father; Stroke (age of onset: 76) in her mother; Transient ischemic attack in her father; Vision loss in her paternal uncle.Social History Occupational History  Not on file Tobacco Use  Smoking status: Never  Smokeless tobacco: Never Vaping Use  Vaping status: Never Used Substance and Sexual Activity  Alcohol use: Never  Drug use: Never  Sexual activity: Not Currently   Partners: Male   Birth control/protection: Abstinence Allergies: Diphenhydramine, Lidocaine, Wasp venom, Alirocumab, Atorvastatin, Epinephrine , Evolocumab, Lidocaine-epinephrine , Lidocaine-epinephrine  (pf), Synvisc [hylan g-f 20], Venom-wasp, Benadryl allergy decongestant, Diphenhydramine hcl, and Venom-honey beeMedications: She has a current medication list which includes the following prescription(s): carboxymethylcellulose, diclofenac , epinephrine , erythromycin, lactobacillus rhamnosus (gg), levothyroxine , losartan , metoprolol  succinate xl, nexlizet , polyethylene glycol, pravastatin , promethazine-dextromethorphan, rifaximin , tretinoin , and vevye.Imaging: MRI lumbar spine (10/29/2023): Degenerative changes from L3-S1L3-4 - DDD, mild central stenosisL4-5 - grade 1 anterolisthesis with moderate to severe central stenosisL5-S1 - grade 1 anterolisthesis with moderate central and severe bilateral foraminal stenosisAP, Lat, Flex, Ext lumbar XR 11/12/2023: L4-5 - trace anterolisthesisL5-S1- severe DDD and grade 1 anterolisthesisMRI cervical spine 06/17/2023:  Multilevel degenerative changes without severe central stenosis Exam: Sara Wilson's vital signs are: There were no vitals taken for this visit.GeneralGeneral Appearance - Well developed, well nourishedApparent Distress - NoneSpineThoracolumbar SpineSuperficially appearance - UnremarkablePrevious posterior incisions - None noted Thoracolumbar / parathoracolumbar tenderness - None noted Thoracolumbar range of motion - PreservedPain with thoracolumbar flexion, extension, rotation, or lateral bending - NoneExtremitiesLower extremityPain with hip motion- noneStraight leg raise - Negative bilaterallyEdema, swelling, varicosities, lymphadenopathy- none notedDorsalis pedis pulses- palpated bilaterallyNeurologicMotorR/L (L2) Hip flexors - 5/5R/L (L3) Quads - 5/5R/L (L4) TA - 5/5R/L (L5) EHL - 5/5R/L (S1) Gastroc - 5/5SensationR/L (L4) Medial malleolus - NormalR/L (L5) First webspace - NormalR/L (S1) Lateral foot - NormalReflexesR/L (L4) Quad - NormalR/L (S1) Achilles - NormalHoffman?s - negative bilaterallyLE clonus - negative bilaterally Impression and plan: Sara Wilson is a 73 y.o. 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  Surgical intervention would consist of L4-S1 anterior/posterior fusion.  I discussed various aspects regarding surgery and recovery.  The patient is not interested in surgery at the current time.- The patient is interested in additional medications and I provided her with information regarding Journavax, sodium channel blocker.-  I recommended she touch base with pain management regarding possible additional lumbar injections if interested.- OTC medications for pain.- RTC in 7 months for repeat clinical evaluation.Scribed for Wenona [place], MD by Cecillia Hamlet, medical scribe December 26, 2023  The documentation recorded by the scribe accurately reflects the services I personally performed and the decisions made by me. I reviewed and confirmed all material entered and/or pre-charted by the scribe. Electronically signed by: Estelita Ranch, MDAssiociate Professor, Department of Orthopaedics and Rehabilitation, [facility]

## 2024-01-09 ENCOUNTER — Encounter
Admit: 2024-01-09 | Payer: PRIVATE HEALTH INSURANCE | Primary: Student in an Organized Health Care Education/Training Program

## 2024-01-09 ENCOUNTER — Inpatient Hospital Stay
Admit: 2024-01-09 | Discharge: 2024-01-09 | Payer: PRIVATE HEALTH INSURANCE | Primary: Student in an Organized Health Care Education/Training Program

## 2024-01-09 DIAGNOSIS — E063 Autoimmune thyroiditis: Secondary | ICD-10-CM

## 2024-01-09 DIAGNOSIS — H5789 Other specified disorders of eye and adnexa: Secondary | ICD-10-CM

## 2024-01-09 DIAGNOSIS — E785 Hyperlipidemia, unspecified: Secondary | ICD-10-CM

## 2024-01-09 DIAGNOSIS — H269 Unspecified cataract: Secondary | ICD-10-CM

## 2024-01-09 DIAGNOSIS — Z1231 Encounter for screening mammogram for malignant neoplasm of breast: Secondary | ICD-10-CM

## 2024-01-09 DIAGNOSIS — I471 SVT (supraventricular tachycardia) (HC Code): Secondary | ICD-10-CM

## 2024-01-09 DIAGNOSIS — K219 Gastro-esophageal reflux disease without esophagitis: Secondary | ICD-10-CM

## 2024-01-09 DIAGNOSIS — I10 Essential (primary) hypertension: Secondary | ICD-10-CM

## 2024-01-09 DIAGNOSIS — T7840XA Allergy, unspecified, initial encounter: Secondary | ICD-10-CM

## 2024-01-09 DIAGNOSIS — M1990 Unspecified osteoarthritis, unspecified site: Secondary | ICD-10-CM

## 2024-01-09 DIAGNOSIS — E079 Disorder of thyroid, unspecified: Principal | ICD-10-CM

## 2024-01-13 ENCOUNTER — Ambulatory Visit
Admit: 2024-01-13 | Payer: PRIVATE HEALTH INSURANCE | Attending: Cardiovascular Disease | Primary: Student in an Organized Health Care Education/Training Program

## 2024-01-13 VITALS — BP 102/60 | HR 69 | Ht 63.0 in | Wt 158.2 lb

## 2024-01-13 DIAGNOSIS — I471 Supraventricular tachycardia, unspecified: Secondary | ICD-10-CM

## 2024-01-13 DIAGNOSIS — E785 Hyperlipidemia, unspecified: Secondary | ICD-10-CM

## 2024-01-13 DIAGNOSIS — I10 Essential (primary) hypertension: Secondary | ICD-10-CM

## 2024-01-13 NOTE — Progress Notes
 [facility] Heart and VascularOUTPATIENT FOLLOW UP NOTEConsult Information:Consultation requested by: Lamona Fairy LABOR, MDReason for referral : pSVTPresentation History:Sara Wilson is a 73 y.o. female with past medical history significant for:1. H/o SVT s/p ablation 20022. HLD3. Hashimotos disease - dx'd around 79825. OA5. HTNwho is referred to establish cardiac care.  She recently moved from [facility] patient was initially seen by myself 2/25 to establish cardiac care in [place] after moving from [facility] her last cardiology note:The patient has a long history of paroxysmal SVT (since her 30s). Underwent ablation for SVT at [facility] in 2002 (Dr Jock). Since then she has had infrequent episodes of SVT. Many of her episodes have occurred following surgical procedures after administration of anesthesia. She has had an echocardiogram on 02/02/13 which showed normal left ventricular function with ejection fraction 55-60% and no significant abnormalities. Cardiac monitor 12/17/2017 showed normal sinus rhythm, sinus bradycardia, sinus tachycardia with some nonsustained atrial tachycardia up to 13 beats and occasional PVCs with PVC burden of 1%. Heart rate ranged from 42 to 185 bpm. Has been followed by Dr. Inocencio with last visit in 2020 where she was doing well and was continued on her metoprolol .During visit on 06/19/20, the patient was having elevated blood pressures running 130s-170s. Also had episode of SVT in 04/2020. Following our visit on 06/23/20, she called the on-call line due to HR 142. She was instructed to take additional doses of her metop at that time. She was seen in EP clinic on 06/30/20 where she was recommended for possible ablation if symptoms recurred.During visit on 09/21/20, the patient had another episode of SVT after a dental procedure.She went to [facility] where HR were mildly elevated at that time. Received IVF and toradol with resolution of symptoms and was discharged home. Ca score was also obtained on 10/31/20 which was 0. During visit on 06/07/2021, the patient was suffering with episodes of mouth dryness, fatigue and numbness and was being worked up for possible rheumatologic condition. She was also having brief runs of SVT at that time. TTE 06/15/21 for systolic murmur showed normal BiV function with no evidence of valve diseaseWas last seen in clinic on 09/2022 by Dr. Inocencio where she was doing well without significant CV symptoms.She reports that when she gets dehydrated, her mouth feels dry and then she gets fatigued and then she will feel pulsations in her neck and she knows that this is leading to SVT.  When she is in SVT she feels LH, SOB, fatigued and anxious.  She reports that in the past year she has only had one episode of SVT - lasts 15-30 min.  She reports that when she gets the SVT it typically resolves with getting IVF.  She reports that vagal maneuvers sometimes will help.   She reports that she will also have a sensation of her heart beating fast and fluttery which lasts for 5-10 secs and then resolves.  This occurs about 1x per month.  These sx are different than the sx she gets when she is in SVT.  She has been having this off and on for the past several years.  This is not too bothersome to her.   She does have an Apple Watch but she does not use it routinelyShe reports that she has never had chest discomfort or SOB.  She reports that she is not as active as she was in [place] because of knee issues.  She was exercising 3-4x per week with  Silver Sneakers.  She just got a knee injection 2-3 weeks ago and she started going back to the gym -- aerobics, balance, light wts, dancing.  She reports that the main functional limitation is knee pain -- this is controlled with the knee injection.  No LH or dizziness.  No syncope or presyncope.  No LE edema.  Monitors bps at  home -- typically 110s/60s HR 60sOf note: she has had myalgias on lipitor.  She also did not tolerate repatha in the past.  She is tolerating nexlizet  and pravastatinAt the time of her initial visit, she was noted to have a murmur and was sent for an echo.  This showed mild MR and TRiInterval HistoryLast visit 2/25.  Since last visit she reports she continues to do well.  She reports that she has not had any recurrence of the svt to her knowledge.  She reports that the flutters are very infrequent (maybe 2x in the past 6 months and only lasted a few a seconds).  No chest discomfort or SOB.  She reports that she did have a sleep study which was inconclusive.  She is now scheduled for a home sleep study. She reports that she was dealing with fatigue related to the stress of moving and settling into her new house.  She reports that as these stressors have decreased her fatigue has also improved. She reports that she is staying active.  She reports that she is walking 2-3x per week for 2-3 miles at a time.  No LH or dizziness.  No syncope or presyncope.  No LE edema.  No orthopnea or pnd.  She reports that she has had some discomfort at the back of her right calf for the past few days but no swelling.  Shx: no tob.  Very rare etoh. She has moved into her own home in [place].  4 sisters live in [place] and a 2 daughters live in , retied nurseFhx: mother - CABG/MVR in 76s, stroke died age 38, HTN; Father - PE, HTN, glaucoma; 5 siblings - Oldest Brother - CAD, Sister - sarcoidosisPast Cardiac Yk:Rnmnwjmb Artery Disease:noneValvular Heart Disease:noneArrhythmia:pSVTPacemaker/ICD Yk:wnwzNuyzm Past Medical Hx:PMH PSH  She   has a past medical history of Allergy, Cataracts, bilateral, Disease of thyroid gland, GERD (gastroesophageal reflux disease), Hashimoto's disease, Hyperlipidemia, Hypertension, Osteoarthritis (2010), SVT (supraventricular tachycardia) (HC Code) (HC CODE), and Thyroid eye disease (2019). She  has a past surgical history that includes Carpal tunnel release (Right, 12/21/2012); Cardiac electrophysiology mapping and ablation; Cesarean section; Breast surgery; Cataract extraction w/  intraocular lens implant (Bilateral, 2022); lower lid lift (2022); Eye surgery (09/23); and Cosmetic surgery (09/23). Social History Family History She  reports that she has never smoked. She has never used smokeless tobacco. She reports that she does not drink alcohol and does not use drugs. Family History Problem Relation Age of Onset  Heart failure Mother   Stroke Mother 6  Hypertension Mother   Arthritis Mother   Asthma Mother   Hearing loss Mother   Heart disease Mother       Mitral Valve  Transient ischemic attack Father   Colon cancer Father       Polyps, Temp Ostomy  COPD Father   High cholesterol Father   Stroke Father       TIA  Sarcoidosis Sister   Metabolic syndrome Brother   Hypertension Brother   Asthma Maternal Grandfather   Depression Daughter   Learning disabilities Daughter   Mental health disorder Daughter  Vision loss Paternal Uncle   Medications Current Outpatient Medications Medication Sig Dispense Refill  carboxymethylcellulose (REFRESH PLUS) 0.5 % ophthalmic solution in dropperette Apply 1 drop to eye.    EPINEPHrine  (EPI-PEN) 0.3 mg/0.3 mL auto-injector Inject 0.3 mg into the muscle once as needed.    erythromycin (ROMYCIN) 5 mg/gram (0.5 %) ophthalmic ointment APPLY SMALL AMOUNT TO SURGICAL SITE THREE TIMES PER DAY    lactobacillus rhamnosus, GG, (CULTURELLE) 10 billion cell capsule Take 1 capsule by mouth daily.    levothyroxine  (SYNTHROID , LEVOTHROID) 50 MCG tablet Take 1 tablet (50 mcg total) by mouth daily. (Patient taking differently: Take 1.5 tablets (75 mcg total) by mouth daily.) 90 tablet 2  losartan  (COZAAR ) 25 mg tablet Take 1 tablet (25 mg total) by mouth. metoprolol  succinate XL (TOPROL -XL) 50 mg 24 hr tablet Take 1 tablet (50 mg total) by mouth daily.    NEXLIZET  180-10 mg Tab Take 1 tablet by mouth daily. 90 tablet 3  polyethylene glycol (MIRALAX ) 17 gram packet Take 1 packet (17 g total) by mouth daily.    pravastatin  (PRAVACHOL ) 40 mg tablet     tretinoin  (RETIN-A ) 0.05 % cream APPLY TO AFFECTED AREA EVERY DAY AT BEDTIME    VEVYE 0.1 % Drop PLACE 1 DROP IN EACH EYE TWICE DAILY AS DIRECTED   No current facility-administered medications for this visit.  Allergies She is allergic to diphenhydramine, lidocaine, wasp venom, alirocumab, atorvastatin, epinephrine , evolocumab, lidocaine-epinephrine , lidocaine-epinephrine  (pf), synvisc [hylan g-f 20], venom-wasp, benadryl allergy decongestant, diphenhydramine hcl, and venom-honey bee. Review of Systems:All 14 point review of systems are reviewed and are negative except for the HPI. Physical Exam:Vitals:Vitals:  01/13/24 1439 BP: 102/60 Site: Right Arm Position: Sitting Cuff Size: Medium Pulse: 69 SpO2: 97% Weight: 71.8 kg Height: 5' 3 (1.6 m)  Exam:Physical ExamConstitutional:     Appearance: Normal appearance. HENT:    Head: Normocephalic and atraumatic. Neck:    Vascular: No carotid bruit or JVD. Cardiovascular:    Rate and Rhythm: Normal rate and regular rhythm.    Pulses: Normal pulses.    Heart sounds: Murmur (1-2 out 6 SM at LUSB/LLSB) heard. Pulmonary:    Effort: Pulmonary effort is normal.    Breath sounds: Normal breath sounds. No wheezing, rhonchi or rales. Abdominal:    General: Abdomen is flat. Bowel sounds are normal.    Palpations: Abdomen is soft. Musculoskeletal:    Right lower leg: No edema.    Left lower leg: No edema. Skin:   General: Skin is warm and dry. Neurological:    General: No focal deficit present.    Mental Status: She is alert and oriented to person, place, and time. Mental status is at baseline. Review of Labs/Diagnostics:LABSLipid Panel:6/25: TC 119 HDL 50 TG 137 LDL 478/30/24: TC 114 TG 93 HDL 48 LDL 48Total Cholesterol No results found for: CHOL LDL No results found for: LDL HDL No results found for: HDL Triglycerides No results found for: TRIG  Complete Metabolic Panel:6/25: TSH 2.2Sodium Sodium (mmol/L) Date Value 03/24/2023 142 11/28/2021 141  Potasium Potassium (mmol/L) Date Value 03/24/2023 4.2 11/28/2021 3.6  Chloride Chloride (mmol/L) Date Value 03/24/2023 105 11/28/2021 106  Bicarb CO2 (mmol/L) Date Value 03/24/2023 28 11/28/2021 23  BUN BUN (mg/dL) Date Value 87/97/7975 21 11/28/2021 12  Creatinine Creatinine (mg/dL) Date Value 87/97/7975 0.84 11/28/2021 0.85  Glucose Glucose (mg/dL) Date Value 87/97/7975 83 11/28/2021 95  Calcium No results found for: CALC Uric Acid No results found for: URICACID AST Aspartate Aminotransferase (AST) (U/L) Date  Value 03/24/2023 24 11/28/2021 21  ALT Alanine Aminotransferase (ALT) (U/L) Date Value 03/24/2023 20 11/28/2021 21  TSH Thyroid Stimulating Hormone (?IU/mL) Date Value 11/28/2021 0.733  HgA1C No results found for: HGBA1C BNP No results found for: BNPPRO  Complete Blood Count:6/25: WBC 4.9 Hg12.7 Hct 38 Plt 172WBC WBC (x1000/?L) Date Value 03/24/2023 5.5 11/28/2021 5.1  Hemoglobin Hemoglobin (g/dL) Date Value 87/97/7975 13.3 11/28/2021 13.7  Hematocrit Hematocrit (%) Date Value 03/24/2023 38.60 11/28/2021 37.50  Platelets Platelets (x1000/?L) Date Value 03/24/2023 188 11/28/2021 149 (L)   PT/INRNo results found for: INR, PROTIMEPast Cardiac Data:EKG: 9/23/252/10/25ECHO: TTE 2/25 * Decreased left ventricular size.  Mild concentric left ventricular hypertrophy.  No regional wall motion abnormalities.  Hyperdynamic left ventricular systolic function.  LVEF calculated by biplane Simpson's was 67%.* Normal diastolic function and filling pressures.* Normal right ventricular cavity size and systolic function.* Atria are normal in size.  No interatrial shunt by color Doppler.* Mild mitral regurgitation.* Mild tricuspid regurgitation.  Estimated pulmonary artery systolic pressure, assuming a right atrial pressure of 3 mmHg, equals 23 mmHg.* All visible segments of the aorta are normal in size.* No prior study available for comparison.TTE 2/23IMPRESSIONS  1. Left ventricular ejection fraction, by estimation, is 60 to 65%. The left ventricle has normal function. The left ventricle has no regional wall motion abnormalities. Left ventricular diastolic parameters were normal.  2. Right ventricular systolic function is normal. The right ventricular size is normal. There is normal pulmonary artery systolic pressure. The estimated right ventricular systolic pressure is 22.9 mmHg.  3. The mitral valve is normal in structure. Trivial mitral valve regurgitation. No evidence of mitral stenosis.  4. The aortic valve is tricuspid. Aortic valve regurgitation is trivial. Aortic valve sclerosis is present, with no evidence of aortic valve stenosis.  5. The inferior vena cava is normal in size with greater than 50% respiratory variability, suggesting right atrial pressure of 3 mmHg. STRESS TESTS:CARDIAC CATHS:HOLTER MONITOR:8/14SINUS RHYTHM WITH RARE PREMATURE ATRIAL COMPLEXES AND PREMATURE VENTRICULAR COMPLEXES. EVENT MONITOR:8/19? Normal sinus rhythm, sinus bradycardia and sinus tachycardia with average heart rate 73 bpm. Heart rate ranged from 42 to 185 bpm. ? Occasional PVCs with PVC burden 1%. ? Nonsustained atrial tachycardia up to 13 beats. CAROTID DOPPLER:No results found for this or any previous visit.CORONARY CALCIUM SCORE:7/22Coronary Calcium Score: Left main: 0 Left anterior descending artery: 0 Left circumflex artery: 0 Right coronary artery: 0 Total: 0 OTHER DIAGNOSTIC IMAGING:N/aAssessment/Impression:1. pSVT -- pt has a long standing history of SVT (since her 30s).  She did have SVT  ablation in 2002.  Since that time her sx are fairly well controlled but she does still get rare episodes of SVT typically triggered by dehydration.  She will note this about 1x per year.  For now will cont with metoprolol  to help prevent recurrence.  I have advised her that if she has recurrent sx to get an ECG with her Apple Watch.  If the sx start to become more frequent, will then refer back to EP for evaluation for possible repeat ablation.  She is in agreement with the plan--Cont metoprolol --Reviewed vagal maneuvers with her.  If she has SVT that does not break with vagal maneuvers I have advised that she can take an extra dose of metoprolol2. HTN: bp is well controlled  Cont current meds3. HLD: did not tolerate lipitor or repatha in the past.  Tolerating bempedoic acid-zeita and pravastatin  with excellent lipid control.  Will cont current meds4. Murmur - echo showed hyperdnyamic LV and mild  MR/TR which accounts for the murmur.  Cont to monitor clinicallyRecommendations:Cont current medsRTC 12 months or sooner if new issues arise. Asberry Gunning, MDAssistant ProfessorInterventional CardiologyYale School of Medicine9/23/2025

## 2024-01-16 ENCOUNTER — Inpatient Hospital Stay
Admit: 2024-01-16 | Discharge: 2024-01-16 | Payer: Medicare (Managed Care) | Primary: Student in an Organized Health Care Education/Training Program

## 2024-01-16 ENCOUNTER — Ambulatory Visit
Admit: 2024-01-16 | Payer: Medicare (Managed Care) | Attending: Endocrinology, Diabetes & Metabolism | Primary: Student in an Organized Health Care Education/Training Program

## 2024-01-16 DIAGNOSIS — E213 Hyperparathyroidism, unspecified: Principal | ICD-10-CM

## 2024-01-16 DIAGNOSIS — E039 Hypothyroidism, unspecified: Secondary | ICD-10-CM

## 2024-01-16 LAB — T4, FREE: BKR FREE T4: 1.39 ng/dL

## 2024-01-16 LAB — TSH: BKR THYROID STIMULATING HORMONE: 0.268 u[IU]/mL — ABNORMAL LOW

## 2024-01-17 ENCOUNTER — Encounter
Admit: 2024-01-17 | Payer: PRIVATE HEALTH INSURANCE | Attending: Endocrinology, Diabetes & Metabolism | Primary: Student in an Organized Health Care Education/Training Program

## 2024-01-17 LAB — CALCIUM, TIMED URINE     (BH GH L LMW YH)
BKR CALCIUM, TIMED URINE: 14.8 mg/dL
BKR CALCIUM, TOTAL IN SAMPLE: 166.5 mg/{total_volume} (ref 100.00–300.00)
BKR CREATININE, TIMED URINE: 119.9 mg/dL
BKR CREATININE, TOTAL IN SAMPLE: 1348.9 mg/{total_volume} (ref 720.0–1510.0)
BKR DURATION OF COLLECTION: 24 h
BKR VOLUME, TIMED URINE: 1125 mL

## 2024-01-21 ENCOUNTER — Encounter
Admit: 2024-01-21 | Payer: PRIVATE HEALTH INSURANCE | Attending: Endocrinology, Diabetes & Metabolism | Primary: Student in an Organized Health Care Education/Training Program

## 2024-01-21 VITALS — BP 120/80 | HR 67 | Ht 63.0 in | Wt 156.0 lb

## 2024-01-21 DIAGNOSIS — M810 Age-related osteoporosis without current pathological fracture: Secondary | ICD-10-CM

## 2024-01-21 DIAGNOSIS — E039 Hypothyroidism, unspecified: Principal | ICD-10-CM

## 2024-01-21 DIAGNOSIS — E213 Hyperparathyroidism, unspecified: Secondary | ICD-10-CM

## 2024-01-21 MED ORDER — LEVOTHYROXINE 75 MCG TABLET
75 | ORAL_TABLET | Freq: Every day | ORAL | 3 refills | 30.00000 days | Status: AC
Start: 2024-01-21 — End: ?

## 2024-01-25 ENCOUNTER — Encounter
Admit: 2024-01-25 | Payer: PRIVATE HEALTH INSURANCE | Attending: Endocrinology, Diabetes & Metabolism | Primary: Student in an Organized Health Care Education/Training Program

## 2024-01-25 DIAGNOSIS — K219 Gastro-esophageal reflux disease without esophagitis: Secondary | ICD-10-CM

## 2024-01-25 DIAGNOSIS — I471 SVT (supraventricular tachycardia) (HC Code): Secondary | ICD-10-CM

## 2024-01-25 DIAGNOSIS — E079 Disorder of thyroid, unspecified: Principal | ICD-10-CM

## 2024-01-25 DIAGNOSIS — E063 Autoimmune thyroiditis: Secondary | ICD-10-CM

## 2024-01-25 DIAGNOSIS — H5789 Other specified disorders of eye and adnexa: Secondary | ICD-10-CM

## 2024-01-25 DIAGNOSIS — T7840XA Allergy, unspecified, initial encounter: Secondary | ICD-10-CM

## 2024-01-25 DIAGNOSIS — E785 Hyperlipidemia, unspecified: Secondary | ICD-10-CM

## 2024-01-25 DIAGNOSIS — I10 Essential (primary) hypertension: Secondary | ICD-10-CM

## 2024-01-25 DIAGNOSIS — M1990 Unspecified osteoarthritis, unspecified site: Secondary | ICD-10-CM

## 2024-01-25 DIAGNOSIS — H269 Unspecified cataract: Secondary | ICD-10-CM

## 2024-01-28 ENCOUNTER — Encounter
Admit: 2024-01-28 | Payer: PRIVATE HEALTH INSURANCE | Attending: Obstetrics and Gynecology | Primary: Student in an Organized Health Care Education/Training Program

## 2024-01-28 NOTE — Other
 BIRADS 1, negative

## 2024-02-10 ENCOUNTER — Inpatient Hospital Stay
Admit: 2024-02-10 | Discharge: 2024-02-10 | Payer: PRIVATE HEALTH INSURANCE | Primary: Student in an Organized Health Care Education/Training Program

## 2024-02-10 DIAGNOSIS — M810 Age-related osteoporosis without current pathological fracture: Principal | ICD-10-CM

## 2024-02-11 ENCOUNTER — Other Ambulatory Visit: Payer: Self-pay | Admitting: Cardiology

## 2024-02-11 DIAGNOSIS — R011 Cardiac murmur, unspecified: Secondary | ICD-10-CM

## 2024-02-11 DIAGNOSIS — I10 Essential (primary) hypertension: Secondary | ICD-10-CM

## 2024-03-02 ENCOUNTER — Encounter
Admit: 2024-03-02 | Payer: PRIVATE HEALTH INSURANCE | Attending: Obstetrics and Gynecology | Primary: Student in an Organized Health Care Education/Training Program

## 2024-03-02 ENCOUNTER — Ambulatory Visit
Admit: 2024-03-02 | Payer: PRIVATE HEALTH INSURANCE | Attending: Obstetrics and Gynecology | Primary: Student in an Organized Health Care Education/Training Program

## 2024-03-02 VITALS — BP 136/83 | HR 77 | Temp 98.60000°F | Resp 18 | Ht 63.0 in | Wt 158.0 lb

## 2024-03-02 DIAGNOSIS — E785 Hyperlipidemia, unspecified: Secondary | ICD-10-CM

## 2024-03-02 DIAGNOSIS — E079 Disorder of thyroid, unspecified: Principal | ICD-10-CM

## 2024-03-02 DIAGNOSIS — K219 Gastro-esophageal reflux disease without esophagitis: Secondary | ICD-10-CM

## 2024-03-02 DIAGNOSIS — M1990 Unspecified osteoarthritis, unspecified site: Secondary | ICD-10-CM

## 2024-03-02 DIAGNOSIS — E063 Autoimmune thyroiditis: Secondary | ICD-10-CM

## 2024-03-02 DIAGNOSIS — Z1231 Encounter for screening mammogram for malignant neoplasm of breast: Secondary | ICD-10-CM

## 2024-03-02 DIAGNOSIS — I10 Essential (primary) hypertension: Secondary | ICD-10-CM

## 2024-03-02 DIAGNOSIS — H5789 Other specified disorders of eye and adnexa: Secondary | ICD-10-CM

## 2024-03-02 DIAGNOSIS — Z01419 Encounter for gynecological examination (general) (routine) without abnormal findings: Principal | ICD-10-CM

## 2024-03-02 DIAGNOSIS — T7840XA Allergy, unspecified, initial encounter: Secondary | ICD-10-CM

## 2024-03-02 DIAGNOSIS — I471 SVT (supraventricular tachycardia) (HC Code): Secondary | ICD-10-CM

## 2024-03-02 DIAGNOSIS — H269 Unspecified cataract: Secondary | ICD-10-CM

## 2024-03-02 MED ORDER — CHOLECALCIFEROL (VITAMIN D3) 125 MCG (5,000 UNIT) CAPSULE
125 | ORAL | 0.00 refills | 26.50000 days | Status: AC
Start: 2024-03-02 — End: ?

## 2024-03-02 NOTE — Progress Notes [1]
 [facility]						Department of Obstetrics, Gynecology, Reproductive MedicineCenter for [facility]  Sara Wilson is a 73 y.o. G64P1000 female who presents for annual exam, repeat pap test, and for mammogram referral for next year. Pt has a history of abnormal paps and HR HPV 16 in 2022. Her last pap in 2024 came back ASCUS HPV positive, she then had a colpo/right vaginal biopsy 04/09/23, which resulted VAIN 1. She is due for a pap in December, but pt requested that we do it today. The pt has no complaints today.  Past Medical History[1]Past Surgical History[2]Hysterectomy: noOvaries intact: noOB History   Gravida 1  Para 1  Term 1  Preterm    AB    Living 0   SAB    IAB    Ectopic    Molar    Multiple    Live Births      Menopause: late 50sHRT use: no	Sexually active: noPartner(s): noneHistory of STIs: noLast pap: last yrHistory of abnormal Pap smear: yes, see HPI Last mammogram: 9/19/25[name] Breast Cancer Risk Score as of 03/02/2024   Sara Wilson 4.1     Patient Population  5-year 1.14% 2.23%  Lifetime 3.19% 6.03%  Because the patient's race/ethnicity is unknown, the model is using data for white patients and might not be accurate.    Last calculated by Sara Wilson, PCT on 03/02/2024 at  3:27 PM  Social History Socioeconomic History  Marital status: Widowed Tobacco Use  Smoking status: Never  Smokeless tobacco: Never Vaping Use  Vaping status: Never Used Substance and Sexual Activity  Alcohol use: Never  Drug use: Never  Sexual activity: Not Currently   Birth control/protection: Abstinence Social Drivers of Health Social Connections: Unknown (08/31/2021)  [facility]  Social Network   Social Network: Not on file Intimate Partner Violence: Unknown (07/23/2021)  Received from [facility]  HITS   Physically Hurt: Not on file   Insult or Talk Down To: Not on file   Threaten Physical Harm: Not on file   Scream or Curse: Not on file Family History Problem Relation Age of Onset  Heart failure Mother   Stroke Mother 37  Hypertension Mother   Arthritis Mother   Asthma Mother   Hearing loss Mother   Heart disease Mother       Mitral Valve  Transient ischemic attack Father   Colon cancer Father       Polyps, Temp Ostomy  COPD Father   High cholesterol Father   Stroke Father       TIA  Sarcoidosis Sister   Metabolic syndrome Brother   Hypertension Brother   Asthma Maternal Grandfather   Depression Daughter   Learning disabilities Daughter   Mental health disorder Daughter   Vision loss Paternal Uncle  Patient Active Problem List Diagnosis  Acquired hypothyroidism  Age-related nuclear cataract of both eyes  Hashimoto's thyroiditis  History of paroxysmal supraventricular tachycardia  Hyperlipidemia  Laryngopharyngeal reflux  Osteopenia  Seasonal allergic rhinitis due to pollen  Thyroid eye disease  Statin myopathy  Primary osteoarthritis of right knee  Primary osteoarthritis of left knee  Abnormal Pap smear of cervix  VAIN I (vaginal intraepithelial neoplasia grade I)  Primary hypertension  PSVT (paroxysmal supraventricular tachycardia) (HC Code)  Spondylolisthesis of lumbar region  Cervical spondylosis Current Medications[3] Allergies[4]Review of SystemsGeneral: Denies unintentional weight loss or gain. HEENT: Denies changes in vision and hearing.Respiratory: Denies SOB and cough.?Cardio:  Denies palpitations and chest pain. ?GI: Denies abdominal pain, nausea,  vomiting and diarrhea.?GU: Denies dysuria and urinary frequency.?Musculo: Denies myalgia and arthralgia.?Skin: Denies rash and pruritus.Neuro: Denies headache and syncope.?Psych: Denies recent changes in mood. Denies anxiety and depression.Physical Exam:BP 136/83  - Pulse 77  - Temp 98.6 ?F (37 ?C) (No-touch scanner)  - Resp 18  - Ht 5' 3 (1.6 m)  - Wt 71.7 kg  - SpO2 99%  - BMI 27.99 kg/m? Body mass index is 27.99 kg/m?SABRAGeneral: calm, cooperative, in NADThyroid: no thyromegaly or nodules noted Heart: RRR without murmurLungs: CTABBreast: soft, normal appearance, symmetrical, non-tender, no masses, no skin changes and no lymphadenopathy bilaterally Abdomen: soft, non-tender, no masses Pelvic: normal female genitalia, no lesions or erythemaVagina: pink, rugae, no lesions visualized on lateral wallsCervix: multiparous, pink, smooth, without lesions, negative CMT,  pap collectedUterus: firm, NT, normal size, shape and consistency Adnexa: firm, NT, no masses or enlargement noted Rectal: deferredThis exam was completed by Sara Lares, APRN chaperoned by myself Sara Wilson, CNM.Assessment: 1. Well woman exam with routine gynecological exam (Primary)Routine care and education providedPap collected, will f/u on testing- Cytology gyn cases     [facility]); Future2. Screening mammogram, encounter forMammogram ordered, pt to schedule- Mammography Screening Tomo Bilateral; Future Plan: Reviewed ASCCP guidelines with paps recommended until age 40. Recommend annual mammogram Recommend DEXA scan indicated age 58 or older based on risk profile. She was encouraged to discuss with her PCP. Recommend colonoscopy q 10 years starting at age 29 unless high risk. She was encouraged to discuss with her PCP. Recommend routine follow-up with PCP Breast self awareness reviewedRecommend healthy diet and regular weight bearing exerciseReturn to care for annual in one year or as needed. Sara Lares, APRN/ Sara Wilson, CNM [1] Past Medical History:Diagnosis Date  Allergy   Cataracts, bilateral   Disease of thyroid gland Hashimoto's  GERD (gastroesophageal reflux disease)   Hashimoto's disease   Hyperlipidemia   Hypertension   Osteoarthritis 2010  approximate date  SVT (supraventricular tachycardia) (HC Code)   Thyroid eye disease 2019 [2] Past Surgical History:Procedure Laterality Date  BREAST SURGERY    cysts  CARDIAC ELECTROPHYSIOLOGY MAPPING AND ABLATION    CARPAL TUNNEL RELEASE Right 12/21/2012  CATARACT EXTRACTION W/  INTRAOCULAR LENS IMPLANT Bilateral 2022  CESAREAN SECTION    COSMETIC SURGERY  09/23  Eyes for TED  EYE SURGERY  09/23  Bilateral Lower Lid Surgery  lower lid lift  2022 [3] Current Outpatient Medications Medication Sig Dispense Refill  carboxymethylcellulose (REFRESH PLUS) 0.5 % ophthalmic solution in dropperette Apply 1 drop to eye.    EPINEPHrine  (EPI-PEN) 0.3 mg/0.3 mL auto-injector Inject 0.3 mg into the muscle once as needed.    lactobacillus rhamnosus, GG, (CULTURELLE) 10 billion cell capsule Take 1 capsule by mouth daily.    levothyroxine  (SYNTHROID , LEVOTHROID) 75 MCG tablet Take 1 tablet (75 mcg total) by mouth daily. 90 tablet 2  losartan  (COZAAR ) 25 mg tablet Take 1 tablet (25 mg total) by mouth.    metoprolol  succinate XL (TOPROL -XL) 50 mg 24 hr tablet Take 1 tablet (50 mg total) by mouth daily.    NEXLIZET  180-10 mg Tab Take 1 tablet by mouth daily. 90 tablet 3  polyethylene glycol (MIRALAX ) 17 gram packet Take 1 packet (17 g total) by mouth daily.    pravastatin  (PRAVACHOL ) 40 mg tablet     tretinoin  (RETIN-A ) 0.05 % cream APPLY TO AFFECTED AREA EVERY DAY AT BEDTIME    VEVYE 0.1 % Drop PLACE 1 DROP IN EACH EYE TWICE DAILY AS DIRECTED  cholecalciferol , vitamin D3, 125 mcg (5,000 unit) capsule 1 capsule (5,000 Units total).   No current facility-administered medications for this visit. [4] AllergiesAllergen Reactions  Diphenhydramine Arrhythmia, Palpitations, Other (See Comments) and Tachycardia   Other reaction(s): tachycardia  Lidocaine Palpitations and Tachycardia   --PALPATATIONS ;Lidocaine with Epi  Wasp Venom Hives  Alirocumab Other (See Comments)   runny nose, sinus congestion, myalgias, UTI, weight gain  Atorvastatin Other (See Comments), Joint pain and Myalgia   Muscle aches  Other Reaction(s): muscle pain/soreness  Other reaction(s): muscle pain/soreness  Muscle aches  Muscle painMuscle painOther reaction(s): muscle pain/sorenessOther reaction(s): muscle pain/soreness  Muscle aches  Muscle painOther reaction(s): muscle pain/soreness  Muscle aches  Muscle pain  Muscle pain  Other reaction(s): muscle pain/soreness  Muscle aches  Other Reaction(s): muscle pain/soreness  Other reaction(s): muscle pain/soreness  Muscle aches  Muscle pain  Muscle aches  Other Reaction(s): muscle pain/soreness  Other reaction(s): muscle pain/soreness  Muscle aches  Muscle pain  Other reaction(s): muscle pain/soreness  Muscle painOther reaction(s): muscle pain/soreness  Muscle aches  Muscle pain  Muscle pain  Other reaction(s): muscle pain/soreness  Muscle aches  Other Reaction(s): muscle pain/soreness  Other reaction(s): muscle pain/soreness  Muscle aches  Muscle pain  Epinephrine  Palpitations and Other (See Comments)   Heart palpitations  Evolocumab Other (See Comments) and Myalgia   Joint painJoint pain Joint painJoint pain  Joint pain Joint painJoint pain  Joint pain Joint pain  Joint pain  Joint pain Joint pain  Lidocaine-Epinephrine  Other (See Comments), Palpitations and Tachycardia   Epi causes tachycardia Epi causes tachycardia. Tolerates lidocaine without adverse reaction.  Lidocaine-Epinephrine  (Pf) Palpitations and Other (See Comments)   Epi causes tachycardia. Tolerates lidocaine without adverse reaction.    Synvisc [Hylan G-F 20] Swelling  Venom-Wasp Swelling  Benadryl Allergy Decongestant Tachycardia  Diphenhydramine Hcl Tachycardia  Venom-Honey Bee Rash

## 2024-03-05 ENCOUNTER — Encounter
Admit: 2024-03-05 | Payer: PRIVATE HEALTH INSURANCE | Attending: Vascular and Interventional Radiology | Primary: Student in an Organized Health Care Education/Training Program

## 2024-03-15 ENCOUNTER — Encounter
Admit: 2024-03-15 | Payer: PRIVATE HEALTH INSURANCE | Attending: Obstetrics and Gynecology | Primary: Student in an Organized Health Care Education/Training Program

## 2024-03-15 ENCOUNTER — Telehealth
Admit: 2024-03-15 | Payer: PRIVATE HEALTH INSURANCE | Attending: Obstetrics and Gynecology | Primary: Student in an Organized Health Care Education/Training Program

## 2024-03-15 NOTE — Telephone Encounter [36]
 Copied from CRM #[id number]. Topic: [facility] General CRM - General Inquiry>> Mar 15, 2024 12:45 PM Hendricks FALCON wrote:[name], Sara Wilson called regarding rtn missed call from [employer].Sara Wilson [name] returning missed call.  Attempted to warm transfer, no answer.  Pt can be reached [phone]

## 2024-03-15 NOTE — Result Encounter Note [77]
 LVM to call office regarding results and recommendations

## 2024-03-15 NOTE — Result Encounter Note [77]
 Spoke to patient, she had already seen her results. Asked if she had any questions, she did not. Advised colposcopy indicted. Scheduled for 05/19/24, the next available opening. Reviewed process briefly, patient has had this done before and had no questions.

## 2024-03-15 NOTE — Result Encounter Note [77]
 Positive HPV 16, needs colpo. Please advise patient, thanks

## 2024-04-04 ENCOUNTER — Other Ambulatory Visit: Payer: Self-pay | Admitting: Nurse Practitioner

## 2024-04-04 DIAGNOSIS — Z79899 Other long term (current) drug therapy: Secondary | ICD-10-CM

## 2024-04-04 DIAGNOSIS — E785 Hyperlipidemia, unspecified: Secondary | ICD-10-CM

## 2024-04-18 ENCOUNTER — Encounter
Admit: 2024-04-18 | Payer: PRIVATE HEALTH INSURANCE | Attending: Endocrinology, Diabetes & Metabolism | Primary: Student in an Organized Health Care Education/Training Program

## 2024-04-28 ENCOUNTER — Encounter
Admit: 2024-04-28 | Payer: PRIVATE HEALTH INSURANCE | Attending: Obstetrics and Gynecology | Primary: Student in an Organized Health Care Education/Training Program

## 2024-04-30 ENCOUNTER — Encounter
Admit: 2024-04-30 | Payer: PRIVATE HEALTH INSURANCE | Attending: Cardiovascular Disease | Primary: Student in an Organized Health Care Education/Training Program

## 2024-04-30 MED ORDER — METOPROLOL SUCCINATE ER 50 MG TABLET,EXTENDED RELEASE 24 HR
50 | ORAL_TABLET | Freq: Every day | ORAL | 4 refills | 90.00000 days | Status: AC
Start: 2024-04-30 — End: ?

## 2024-04-30 NOTE — Telephone Encounter [36]
 Pt was last seen by Dr Betha. Refill of Metoprolol  Succ 50mg  tabTake 1 tablet (50 mg total) by mouth daily., Starting Mon 08/12/2022, [facility] [phone]

## 2024-05-04 ENCOUNTER — Other Ambulatory Visit: Payer: Self-pay | Admitting: Nurse Practitioner

## 2024-05-04 DIAGNOSIS — Z79899 Other long term (current) drug therapy: Secondary | ICD-10-CM

## 2024-05-04 DIAGNOSIS — E785 Hyperlipidemia, unspecified: Secondary | ICD-10-CM

## 2024-05-14 ENCOUNTER — Other Ambulatory Visit: Payer: Self-pay | Admitting: Nurse Practitioner

## 2024-05-14 DIAGNOSIS — E785 Hyperlipidemia, unspecified: Secondary | ICD-10-CM

## 2024-05-14 DIAGNOSIS — Z79899 Other long term (current) drug therapy: Secondary | ICD-10-CM

## 2024-05-18 NOTE — Telephone Encounter (Signed)
 It appears she has moved and is now followed by [facility] cardiology

## 2024-05-19 ENCOUNTER — Encounter
Admit: 2024-05-19 | Payer: PRIVATE HEALTH INSURANCE | Attending: Obstetrics and Gynecology | Primary: Student in an Organized Health Care Education/Training Program

## 2024-05-24 ENCOUNTER — Other Ambulatory Visit: Payer: Self-pay | Admitting: Nurse Practitioner

## 2024-05-24 DIAGNOSIS — E785 Hyperlipidemia, unspecified: Secondary | ICD-10-CM

## 2024-05-24 DIAGNOSIS — Z79899 Other long term (current) drug therapy: Secondary | ICD-10-CM

## 2024-05-26 ENCOUNTER — Encounter
Admit: 2024-05-26 | Payer: PRIVATE HEALTH INSURANCE | Attending: Endocrinology, Diabetes & Metabolism | Primary: Student in an Organized Health Care Education/Training Program

## 2024-05-26 DIAGNOSIS — M810 Age-related osteoporosis without current pathological fracture: Secondary | ICD-10-CM

## 2024-05-26 DIAGNOSIS — E039 Hypothyroidism, unspecified: Principal | ICD-10-CM

## 2024-05-26 DIAGNOSIS — E063 Autoimmune thyroiditis: Secondary | ICD-10-CM

## 2024-05-26 DIAGNOSIS — E213 Hyperparathyroidism, unspecified: Secondary | ICD-10-CM

## 2024-05-26 NOTE — Patient Instructions [37]
 Labs August 2026Osteopenia monitoring

## 2024-06-09 ENCOUNTER — Encounter
Admit: 2024-06-09 | Payer: PRIVATE HEALTH INSURANCE | Attending: Obstetrics and Gynecology | Primary: Student in an Organized Health Care Education/Training Program

## 2024-08-04 ENCOUNTER — Encounter
Admit: 2024-08-04 | Payer: PRIVATE HEALTH INSURANCE | Attending: Orthopedic Surgery | Primary: Student in an Organized Health Care Education/Training Program

## 2024-11-24 ENCOUNTER — Encounter
Admit: 2024-11-24 | Payer: PRIVATE HEALTH INSURANCE | Attending: Endocrinology, Diabetes & Metabolism | Primary: Student in an Organized Health Care Education/Training Program
